# Patient Record
Sex: Male | Born: 1950
Health system: Southern US, Community
[De-identification: ages and names within clinical notes are randomized; demographics above are authoritative.]

## PROBLEM LIST (undated history)

## (undated) ENCOUNTER — Emergency Department (HOSPITAL_COMMUNITY): Admission: EM | Payer: Medicare HMO | Source: Home / Self Care

## (undated) DIAGNOSIS — F419 Anxiety disorder, unspecified: Secondary | ICD-10-CM

## (undated) DIAGNOSIS — B182 Chronic viral hepatitis C: Secondary | ICD-10-CM

## (undated) DIAGNOSIS — E119 Type 2 diabetes mellitus without complications: Secondary | ICD-10-CM

## (undated) DIAGNOSIS — I10 Essential (primary) hypertension: Secondary | ICD-10-CM

## (undated) DIAGNOSIS — I214 Non-ST elevation (NSTEMI) myocardial infarction: Secondary | ICD-10-CM

## (undated) DIAGNOSIS — Z21 Asymptomatic human immunodeficiency virus [HIV] infection status: Secondary | ICD-10-CM

## (undated) DIAGNOSIS — T7840XA Allergy, unspecified, initial encounter: Secondary | ICD-10-CM

## (undated) DIAGNOSIS — F32A Depression, unspecified: Secondary | ICD-10-CM

## (undated) HISTORY — DX: Allergy, unspecified, initial encounter: T78.40XA

## (undated) HISTORY — DX: Anxiety disorder, unspecified: F41.9

## (undated) HISTORY — DX: Depression, unspecified: F32.A

## (undated) HISTORY — PX: NO PAST SURGERIES: SHX2092

---

## 2006-11-12 ENCOUNTER — Emergency Department (HOSPITAL_COMMUNITY): Admission: EM | Admit: 2006-11-12 | Discharge: 2006-11-13 | Payer: Self-pay | Admitting: Emergency Medicine

## 2007-05-25 ENCOUNTER — Ambulatory Visit: Payer: Self-pay | Admitting: Internal Medicine

## 2007-05-25 ENCOUNTER — Encounter (INDEPENDENT_AMBULATORY_CARE_PROVIDER_SITE_OTHER): Payer: Self-pay | Admitting: Nurse Practitioner

## 2007-05-25 LAB — CONVERTED CEMR LAB
ALT: 56 units/L — ABNORMAL HIGH (ref 0–53)
AST: 44 units/L — ABNORMAL HIGH (ref 0–37)
Albumin: 4 g/dL (ref 3.5–5.2)
Alkaline Phosphatase: 77 units/L (ref 39–117)
BUN: 19 mg/dL (ref 6–23)
Basophils Absolute: 0 10*3/uL (ref 0.0–0.1)
Basophils Relative: 0 % (ref 0–1)
CO2: 23 meq/L (ref 19–32)
Calcium: 9.2 mg/dL (ref 8.4–10.5)
Chloride: 107 meq/L (ref 96–112)
Cholesterol: 190 mg/dL (ref 0–200)
Creatinine, Ser: 1.08 mg/dL (ref 0.40–1.50)
Eosinophils Absolute: 0.2 10*3/uL (ref 0.0–0.7)
Eosinophils Relative: 5 % (ref 0–5)
Glucose, Bld: 80 mg/dL (ref 70–99)
HCT: 47.1 % (ref 39.0–52.0)
HDL: 42 mg/dL (ref >39)
Hemoglobin: 15.6 g/dL (ref 13.0–17.0)
LDL Cholesterol: 128 mg/dL — ABNORMAL HIGH (ref 0–99)
Lymphocytes Relative: 42 % (ref 12–46)
Lymphs Abs: 1.7 10*3/uL (ref 0.7–4.0)
MCHC: 33.1 g/dL (ref 30.0–36.0)
MCV: 89.7 fL (ref 78.0–100.0)
Monocytes Absolute: 0.4 10*3/uL (ref 0.1–1.0)
Monocytes Relative: 10 % (ref 3–12)
Neutro Abs: 1.8 10*3/uL (ref 1.7–7.7)
Neutrophils Relative %: 44 % (ref 43–77)
PSA: 2.73 ng/mL (ref 0.10–4.00)
Platelets: 224 10*3/uL (ref 150–400)
Potassium: 4.1 meq/L (ref 3.5–5.3)
RBC: 5.25 M/uL (ref 4.22–5.81)
RDW: 13.3 % (ref 11.5–15.5)
Sodium: 141 meq/L (ref 135–145)
TSH: 1.066 microintl units/mL (ref 0.350–5.50)
Total Bilirubin: 1 mg/dL (ref 0.3–1.2)
Total CHOL/HDL Ratio: 4.5
Total Protein: 7.7 g/dL (ref 6.0–8.3)
Triglycerides: 101 mg/dL (ref <150)
VLDL: 20 mg/dL (ref 0–40)
WBC: 4.2 10*3/uL (ref 4.0–10.5)

## 2007-05-26 ENCOUNTER — Ambulatory Visit: Payer: Self-pay | Admitting: *Deleted

## 2007-07-13 ENCOUNTER — Encounter (INDEPENDENT_AMBULATORY_CARE_PROVIDER_SITE_OTHER): Payer: Self-pay | Admitting: Nurse Practitioner

## 2007-07-13 ENCOUNTER — Ambulatory Visit: Payer: Self-pay | Admitting: Family Medicine

## 2007-07-13 DIAGNOSIS — B182 Chronic viral hepatitis C: Secondary | ICD-10-CM

## 2007-07-13 DIAGNOSIS — Z8619 Personal history of other infectious and parasitic diseases: Secondary | ICD-10-CM | POA: Insufficient documentation

## 2007-07-13 LAB — CONVERTED CEMR LAB
HCV Ab: POSITIVE — AB
Hep A Total Ab: POSITIVE — AB
Hep B Core Total Ab: POSITIVE — AB
Hep B E Ab: NEGATIVE
Hep B S Ab: NEGATIVE

## 2007-07-21 ENCOUNTER — Ambulatory Visit: Payer: Self-pay | Admitting: Internal Medicine

## 2007-07-21 LAB — CONVERTED CEMR LAB
HCV Genotype: 1
HCV Quantitative: 2310000 intl units/mL — ABNORMAL HIGH (ref <43)

## 2007-08-03 ENCOUNTER — Ambulatory Visit: Payer: Self-pay | Admitting: Internal Medicine

## 2007-08-10 ENCOUNTER — Ambulatory Visit: Payer: Self-pay | Admitting: Internal Medicine

## 2007-10-12 ENCOUNTER — Ambulatory Visit: Payer: Self-pay | Admitting: Family Medicine

## 2007-12-27 ENCOUNTER — Ambulatory Visit: Payer: Self-pay | Admitting: Internal Medicine

## 2007-12-27 LAB — CONVERTED CEMR LAB
ALT: 70 units/L — ABNORMAL HIGH (ref 0–53)
AST: 52 units/L — ABNORMAL HIGH (ref 0–37)
Albumin: 4.3 g/dL (ref 3.5–5.2)
Alkaline Phosphatase: 78 units/L (ref 39–117)
Amphetamine Screen, Ur: NEGATIVE
BUN: 20 mg/dL (ref 6–23)
Barbiturate Quant, Ur: NEGATIVE
Benzodiazepines.: NEGATIVE
CO2: 28 meq/L (ref 19–32)
Calcium: 9.5 mg/dL (ref 8.4–10.5)
Chloride: 102 meq/L (ref 96–112)
Cholesterol: 186 mg/dL (ref 0–200)
Cocaine Metabolites: NEGATIVE
Creatinine, Ser: 1.18 mg/dL (ref 0.40–1.50)
Creatinine,U: 258.6 mg/dL
Glucose, Bld: 81 mg/dL (ref 70–99)
HDL: 44 mg/dL (ref >39)
LDL Cholesterol: 123 mg/dL — ABNORMAL HIGH (ref 0–99)
Marijuana Metabolite: NEGATIVE
Methadone: NEGATIVE
Opiate Screen, Urine: NEGATIVE
Phencyclidine (PCP): NEGATIVE
Potassium: 3.9 meq/L (ref 3.5–5.3)
Propoxyphene: NEGATIVE
Sodium: 139 meq/L (ref 135–145)
Total Bilirubin: 0.9 mg/dL (ref 0.3–1.2)
Total CHOL/HDL Ratio: 4.2
Total Protein: 8.9 g/dL — ABNORMAL HIGH (ref 6.0–8.3)
Triglycerides: 95 mg/dL (ref <150)
VLDL: 19 mg/dL (ref 0–40)

## 2008-02-09 ENCOUNTER — Ambulatory Visit: Payer: Self-pay | Admitting: Gastroenterology

## 2008-03-30 ENCOUNTER — Encounter (INDEPENDENT_AMBULATORY_CARE_PROVIDER_SITE_OTHER): Payer: Self-pay | Admitting: Diagnostic Radiology

## 2008-03-30 ENCOUNTER — Ambulatory Visit (HOSPITAL_COMMUNITY): Admission: RE | Admit: 2008-03-30 | Discharge: 2008-03-30 | Payer: Self-pay | Admitting: Gastroenterology

## 2008-04-10 ENCOUNTER — Ambulatory Visit: Payer: Self-pay | Admitting: Gastroenterology

## 2008-05-05 ENCOUNTER — Emergency Department (HOSPITAL_COMMUNITY): Admission: EM | Admit: 2008-05-05 | Discharge: 2008-05-05 | Payer: Self-pay | Admitting: Emergency Medicine

## 2008-05-08 ENCOUNTER — Ambulatory Visit: Payer: Self-pay | Admitting: Internal Medicine

## 2008-05-28 ENCOUNTER — Encounter (INDEPENDENT_AMBULATORY_CARE_PROVIDER_SITE_OTHER): Payer: Self-pay | Admitting: Internal Medicine

## 2008-05-28 ENCOUNTER — Ambulatory Visit: Payer: Self-pay | Admitting: Internal Medicine

## 2008-05-28 LAB — CONVERTED CEMR LAB
ALT: 27 units/L (ref 0–53)
AST: 28 units/L (ref 0–37)
Albumin: 4.2 g/dL (ref 3.5–5.2)
Alkaline Phosphatase: 76 units/L (ref 39–117)
BUN: 16 mg/dL (ref 6–23)
Band Neutrophils: 0 % (ref 0–10)
Basophils Absolute: 0 10*3/uL (ref 0.0–0.1)
Basophils Relative: 1 % (ref 0–1)
CO2: 28 meq/L (ref 19–32)
Calcium: 9.4 mg/dL (ref 8.4–10.5)
Chloride: 100 meq/L (ref 96–112)
Cholesterol: 174 mg/dL (ref 0–200)
Creatinine, Ser: 1.05 mg/dL (ref 0.40–1.50)
Eosinophils Absolute: 0.1 10*3/uL (ref 0.0–0.7)
Eosinophils Relative: 3 % (ref 0–5)
Glucose, Bld: 90 mg/dL (ref 70–99)
HCT: 47.2 % (ref 39.0–52.0)
HDL: 36 mg/dL — ABNORMAL LOW (ref >39)
Hemoglobin: 15.7 g/dL (ref 13.0–17.0)
LDL Cholesterol: 115 mg/dL — ABNORMAL HIGH (ref 0–99)
Lymphocytes Relative: 45 % (ref 12–46)
Lymphs Abs: 1.3 10*3/uL (ref 0.7–4.0)
MCHC: 33.3 g/dL (ref 30.0–36.0)
MCV: 89.6 fL (ref 78.0–100.0)
Monocytes Absolute: 0.3 10*3/uL (ref 0.1–1.0)
Monocytes Relative: 9 % (ref 3–12)
Neutro Abs: 1.2 10*3/uL — ABNORMAL LOW (ref 1.7–7.7)
Neutrophils Relative %: 42 % — ABNORMAL LOW (ref 43–77)
PSA: 5.03 ng/mL — ABNORMAL HIGH (ref 0.10–4.00)
Platelets: 273 10*3/uL (ref 150–400)
Potassium: 3.7 meq/L (ref 3.5–5.3)
RBC: 5.27 M/uL (ref 4.22–5.81)
RDW: 12.5 % (ref 11.5–15.5)
Sodium: 139 meq/L (ref 135–145)
Total Bilirubin: 0.6 mg/dL (ref 0.3–1.2)
Total CHOL/HDL Ratio: 4.8
Total Protein: 8.6 g/dL — ABNORMAL HIGH (ref 6.0–8.3)
Triglycerides: 113 mg/dL (ref <150)
VLDL: 23 mg/dL (ref 0–40)
WBC: 2.9 10*3/uL — ABNORMAL LOW (ref 4.0–10.5)

## 2008-09-19 ENCOUNTER — Ambulatory Visit: Payer: Self-pay | Admitting: Internal Medicine

## 2008-09-25 ENCOUNTER — Ambulatory Visit (HOSPITAL_COMMUNITY): Admission: RE | Admit: 2008-09-25 | Discharge: 2008-09-25 | Payer: Self-pay | Admitting: Internal Medicine

## 2008-10-05 ENCOUNTER — Ambulatory Visit: Payer: Self-pay | Admitting: Internal Medicine

## 2008-12-13 ENCOUNTER — Encounter (INDEPENDENT_AMBULATORY_CARE_PROVIDER_SITE_OTHER): Payer: Self-pay | Admitting: Internal Medicine

## 2008-12-13 ENCOUNTER — Ambulatory Visit: Payer: Self-pay | Admitting: Internal Medicine

## 2008-12-13 LAB — CONVERTED CEMR LAB
ALT: 83 units/L — ABNORMAL HIGH (ref 0–53)
AST: 57 units/L — ABNORMAL HIGH (ref 0–37)
Albumin: 4.1 g/dL (ref 3.5–5.2)
Alkaline Phosphatase: 72 units/L (ref 39–117)
BUN: 15 mg/dL (ref 6–23)
CO2: 21 meq/L (ref 19–32)
Calcium: 8.9 mg/dL (ref 8.4–10.5)
Chloride: 108 meq/L (ref 96–112)
Cholesterol: 163 mg/dL (ref 0–200)
Creatinine, Ser: 1.03 mg/dL (ref 0.40–1.50)
Glucose, Bld: 84 mg/dL (ref 70–99)
HDL: 36 mg/dL — ABNORMAL LOW (ref >39)
LDL Cholesterol: 111 mg/dL — ABNORMAL HIGH (ref 0–99)
Potassium: 4.1 meq/L (ref 3.5–5.3)
Sodium: 140 meq/L (ref 135–145)
Total Bilirubin: 1.1 mg/dL (ref 0.3–1.2)
Total CHOL/HDL Ratio: 4.5
Total Protein: 8.3 g/dL (ref 6.0–8.3)
Triglycerides: 82 mg/dL (ref <150)
VLDL: 16 mg/dL (ref 0–40)

## 2008-12-18 ENCOUNTER — Ambulatory Visit: Payer: Self-pay | Admitting: Internal Medicine

## 2008-12-18 ENCOUNTER — Encounter (INDEPENDENT_AMBULATORY_CARE_PROVIDER_SITE_OTHER): Payer: Self-pay | Admitting: Internal Medicine

## 2008-12-18 DIAGNOSIS — B20 Human immunodeficiency virus [HIV] disease: Secondary | ICD-10-CM | POA: Insufficient documentation

## 2008-12-18 LAB — CONVERTED CEMR LAB
HIV 1 RNA Quant: 12000 copies/mL — ABNORMAL HIGH (ref <48)
HIV-1 RNA Quant, Log: 4.08 — ABNORMAL HIGH (ref <1.68)

## 2008-12-21 ENCOUNTER — Encounter (INDEPENDENT_AMBULATORY_CARE_PROVIDER_SITE_OTHER): Payer: Self-pay | Admitting: Internal Medicine

## 2008-12-21 LAB — CONVERTED CEMR LAB
HIV-1 antibody: POSITIVE — AB
HIV-2 Ab: UNDETERMINED — AB

## 2008-12-27 ENCOUNTER — Ambulatory Visit: Payer: Self-pay | Admitting: Internal Medicine

## 2009-01-01 ENCOUNTER — Encounter (INDEPENDENT_AMBULATORY_CARE_PROVIDER_SITE_OTHER): Payer: Self-pay | Admitting: *Deleted

## 2009-01-01 ENCOUNTER — Encounter (INDEPENDENT_AMBULATORY_CARE_PROVIDER_SITE_OTHER): Payer: Self-pay | Admitting: Adult Health

## 2009-01-01 ENCOUNTER — Ambulatory Visit: Payer: Self-pay | Admitting: Internal Medicine

## 2009-01-01 LAB — CONVERTED CEMR LAB
Absolute CD4: 642 #/uL (ref 381–1469)
Basophils Absolute: 0 10*3/uL (ref 0.0–0.1)
Basophils Relative: 1 % (ref 0–1)
CD4 T Helper %: 37 % (ref 32–62)
Eosinophils Absolute: 0.1 10*3/uL (ref 0.0–0.7)
Eosinophils Relative: 3 % (ref 0–5)
HCT: 43.6 % (ref 39.0–52.0)
HCV Ab: REACTIVE — AB
Hemoglobin: 14.6 g/dL (ref 13.0–17.0)
Hep A Total Ab: POSITIVE — AB
Hep B S Ab: NEGATIVE
Hepatitis B Surface Ag: NEGATIVE
Lymphocytes Relative: 51 % — ABNORMAL HIGH (ref 12–46)
Lymphs Abs: 1.8 10*3/uL (ref 0.7–4.0)
MCHC: 33.5 g/dL (ref 30.0–36.0)
MCV: 88.4 fL (ref 78.0–100.0)
Monocytes Absolute: 0.3 10*3/uL (ref 0.1–1.0)
Monocytes Relative: 10 % (ref 3–12)
Neutro Abs: 1.2 10*3/uL — ABNORMAL LOW (ref 1.7–7.7)
Neutrophils Relative %: 36 % — ABNORMAL LOW (ref 43–77)
Platelets: 240 10*3/uL (ref 150–400)
RBC: 4.93 M/uL (ref 4.22–5.81)
RDW: 12.8 % (ref 11.5–15.5)
Total lymphocyte count: 1734 cells/mcL (ref 700–3300)
WBC: 3.4 10*3/uL — ABNORMAL LOW (ref 4.0–10.5)

## 2009-01-17 ENCOUNTER — Encounter: Payer: Self-pay | Admitting: Internal Medicine

## 2009-01-17 ENCOUNTER — Ambulatory Visit: Payer: Self-pay | Admitting: Internal Medicine

## 2009-01-17 DIAGNOSIS — E785 Hyperlipidemia, unspecified: Secondary | ICD-10-CM | POA: Insufficient documentation

## 2009-01-17 DIAGNOSIS — I10 Essential (primary) hypertension: Secondary | ICD-10-CM | POA: Insufficient documentation

## 2009-04-17 ENCOUNTER — Ambulatory Visit: Payer: Self-pay | Admitting: Internal Medicine

## 2009-04-17 LAB — CONVERTED CEMR LAB
ALT: 58 units/L — ABNORMAL HIGH (ref 0–53)
AST: 48 units/L — ABNORMAL HIGH (ref 0–37)
Absolute CD4: 598 #/uL (ref 381–1469)
Albumin: 4.3 g/dL (ref 3.5–5.2)
Alkaline Phosphatase: 65 units/L (ref 39–117)
BUN: 17 mg/dL (ref 6–23)
Basophils Absolute: 0 10*3/uL (ref 0.0–0.1)
Basophils Relative: 0 % (ref 0–1)
CD4 T Helper %: 37 % (ref 32–62)
CO2: 22 meq/L (ref 19–32)
Calcium: 9.4 mg/dL (ref 8.4–10.5)
Chloride: 106 meq/L (ref 96–112)
Creatinine, Ser: 1 mg/dL (ref 0.40–1.50)
Eosinophils Absolute: 0.1 10*3/uL (ref 0.0–0.7)
Eosinophils Relative: 3 % (ref 0–5)
Glucose, Bld: 76 mg/dL (ref 70–99)
HCT: 45.4 % (ref 39.0–52.0)
HIV 1 RNA Quant: 5090 copies/mL — ABNORMAL HIGH (ref <48)
HIV-1 RNA Quant, Log: 3.71 — ABNORMAL HIGH (ref <1.68)
Hemoglobin: 15.2 g/dL (ref 13.0–17.0)
Lymphocytes Relative: 49 % — ABNORMAL HIGH (ref 12–46)
Lymphs Abs: 1.6 10*3/uL (ref 0.7–4.0)
MCHC: 33.5 g/dL (ref 30.0–36.0)
MCV: 88.8 fL (ref 78.0–100.0)
Monocytes Absolute: 0.3 10*3/uL (ref 0.1–1.0)
Monocytes Relative: 10 % (ref 3–12)
Neutro Abs: 1.3 10*3/uL — ABNORMAL LOW (ref 1.7–7.7)
Neutrophils Relative %: 39 % — ABNORMAL LOW (ref 43–77)
Platelets: 245 10*3/uL (ref 150–400)
Potassium: 3.9 meq/L (ref 3.5–5.3)
RBC: 5.11 M/uL (ref 4.22–5.81)
RDW: 13.2 % (ref 11.5–15.5)
Sodium: 138 meq/L (ref 135–145)
Total Bilirubin: 0.8 mg/dL (ref 0.3–1.2)
Total Protein: 8.5 g/dL — ABNORMAL HIGH (ref 6.0–8.3)
Total lymphocyte count: 1617 cells/mcL (ref 700–3300)
WBC: 3.3 10*3/uL — ABNORMAL LOW (ref 4.0–10.5)

## 2009-05-02 ENCOUNTER — Encounter: Payer: Self-pay | Admitting: Internal Medicine

## 2009-05-02 ENCOUNTER — Ambulatory Visit: Payer: Self-pay | Admitting: Internal Medicine

## 2009-05-24 ENCOUNTER — Telehealth: Payer: Self-pay | Admitting: Internal Medicine

## 2009-07-10 ENCOUNTER — Emergency Department (HOSPITAL_COMMUNITY): Admission: EM | Admit: 2009-07-10 | Discharge: 2009-07-10 | Payer: Self-pay | Admitting: Emergency Medicine

## 2009-07-25 ENCOUNTER — Encounter: Payer: Self-pay | Admitting: Internal Medicine

## 2009-07-30 ENCOUNTER — Ambulatory Visit: Payer: Self-pay | Admitting: Internal Medicine

## 2009-07-30 LAB — CONVERTED CEMR LAB
ALT: 58 units/L — ABNORMAL HIGH (ref 0–53)
AST: 45 units/L — ABNORMAL HIGH (ref 0–37)
Albumin: 4.3 g/dL (ref 3.5–5.2)
Alkaline Phosphatase: 86 units/L (ref 39–117)
BUN: 21 mg/dL (ref 6–23)
Basophils Absolute: 0 10*3/uL (ref 0.0–0.1)
Basophils Relative: 0 % (ref 0–1)
CO2: 30 meq/L (ref 19–32)
Calcium: 9.8 mg/dL (ref 8.4–10.5)
Chloride: 97 meq/L (ref 96–112)
Creatinine, Ser: 1.17 mg/dL (ref 0.40–1.50)
Eosinophils Absolute: 0.1 10*3/uL (ref 0.0–0.7)
Eosinophils Relative: 1 % (ref 0–5)
Glucose, Bld: 67 mg/dL — ABNORMAL LOW (ref 70–99)
HCT: 48.8 % (ref 39.0–52.0)
HIV 1 RNA Quant: 2400 copies/mL — ABNORMAL HIGH (ref <48)
HIV-1 RNA Quant, Log: 3.38 — ABNORMAL HIGH (ref <1.68)
Hemoglobin: 16.3 g/dL (ref 13.0–17.0)
Lymphocytes Relative: 43 % (ref 12–46)
Lymphs Abs: 2 10*3/uL (ref 0.7–4.0)
MCHC: 33.4 g/dL (ref 30.0–36.0)
MCV: 87.9 fL (ref 78.0–100.0)
Monocytes Absolute: 0.4 10*3/uL (ref 0.1–1.0)
Monocytes Relative: 10 % (ref 3–12)
Neutro Abs: 2.1 10*3/uL (ref 1.7–7.7)
Neutrophils Relative %: 45 % (ref 43–77)
Platelets: 305 10*3/uL (ref 150–400)
Potassium: 3.4 meq/L — ABNORMAL LOW (ref 3.5–5.3)
RBC: 5.55 M/uL (ref 4.22–5.81)
RDW: 12.5 % (ref 11.5–15.5)
Sodium: 138 meq/L (ref 135–145)
Total Bilirubin: 1 mg/dL (ref 0.3–1.2)
Total Protein: 9.7 g/dL — ABNORMAL HIGH (ref 6.0–8.3)
WBC: 4.6 10*3/uL (ref 4.0–10.5)

## 2009-08-21 ENCOUNTER — Ambulatory Visit: Payer: Self-pay | Admitting: Internal Medicine

## 2009-10-02 ENCOUNTER — Encounter (INDEPENDENT_AMBULATORY_CARE_PROVIDER_SITE_OTHER): Payer: Self-pay | Admitting: *Deleted

## 2009-10-16 ENCOUNTER — Encounter: Payer: Self-pay | Admitting: Internal Medicine

## 2009-12-10 ENCOUNTER — Ambulatory Visit: Payer: Self-pay | Admitting: Internal Medicine

## 2009-12-10 LAB — CONVERTED CEMR LAB
ALT: 46 units/L (ref 0–53)
AST: 37 units/L (ref 0–37)
Albumin: 4 g/dL (ref 3.5–5.2)
Alkaline Phosphatase: 73 units/L (ref 39–117)
BUN: 18 mg/dL (ref 6–23)
Basophils Absolute: 0 10*3/uL (ref 0.0–0.1)
Basophils Relative: 0 % (ref 0–1)
CO2: 23 meq/L (ref 19–32)
Calcium: 8.7 mg/dL (ref 8.4–10.5)
Chloride: 110 meq/L (ref 96–112)
Creatinine, Ser: 0.96 mg/dL (ref 0.40–1.50)
Eosinophils Absolute: 0.1 10*3/uL (ref 0.0–0.7)
Eosinophils Relative: 2 % (ref 0–5)
Glucose, Bld: 91 mg/dL (ref 70–99)
HCT: 43.9 % (ref 39.0–52.0)
HIV 1 RNA Quant: 10400 copies/mL — ABNORMAL HIGH (ref <20)
HIV-1 RNA Quant, Log: 4.02 — ABNORMAL HIGH (ref <1.30)
Hemoglobin: 14.8 g/dL (ref 13.0–17.0)
Lymphocytes Relative: 49 % — ABNORMAL HIGH (ref 12–46)
Lymphs Abs: 2.1 10*3/uL (ref 0.7–4.0)
MCHC: 33.7 g/dL (ref 30.0–36.0)
MCV: 88.2 fL (ref 78.0–100.0)
Monocytes Absolute: 0.4 10*3/uL (ref 0.1–1.0)
Monocytes Relative: 10 % (ref 3–12)
Neutro Abs: 1.6 10*3/uL — ABNORMAL LOW (ref 1.7–7.7)
Neutrophils Relative %: 38 % — ABNORMAL LOW (ref 43–77)
Platelets: 239 10*3/uL (ref 150–400)
Potassium: 4.1 meq/L (ref 3.5–5.3)
RBC: 4.98 M/uL (ref 4.22–5.81)
RDW: 12.9 % (ref 11.5–15.5)
Sodium: 141 meq/L (ref 135–145)
Total Bilirubin: 0.5 mg/dL (ref 0.3–1.2)
Total Protein: 7.9 g/dL (ref 6.0–8.3)
WBC: 4.2 10*3/uL (ref 4.0–10.5)

## 2009-12-24 ENCOUNTER — Ambulatory Visit: Payer: Self-pay | Admitting: Internal Medicine

## 2010-03-25 ENCOUNTER — Encounter: Payer: Self-pay | Admitting: Internal Medicine

## 2010-03-25 ENCOUNTER — Ambulatory Visit
Admission: RE | Admit: 2010-03-25 | Discharge: 2010-03-25 | Payer: Self-pay | Source: Home / Self Care | Attending: Internal Medicine | Admitting: Internal Medicine

## 2010-03-25 LAB — CONVERTED CEMR LAB
ALT: 35 units/L (ref 0–53)
AST: 32 units/L (ref 0–37)
Albumin: 4 g/dL (ref 3.5–5.2)
Alkaline Phosphatase: 75 units/L (ref 39–117)
BUN: 15 mg/dL (ref 6–23)
Basophils Absolute: 0 10*3/uL (ref 0.0–0.1)
Basophils Relative: 1 % (ref 0–1)
CO2: 26 meq/L (ref 19–32)
Calcium: 9.4 mg/dL (ref 8.4–10.5)
Chloride: 105 meq/L (ref 96–112)
Cholesterol: 178 mg/dL (ref 0–200)
Creatinine, Ser: 1.07 mg/dL (ref 0.40–1.50)
Eosinophils Absolute: 0.1 10*3/uL (ref 0.0–0.7)
Eosinophils Relative: 2 % (ref 0–5)
Glucose, Bld: 90 mg/dL (ref 70–99)
HCT: 44.9 % (ref 39.0–52.0)
HDL: 25 mg/dL — ABNORMAL LOW (ref >39)
HIV 1 RNA Quant: 18300 copies/mL — ABNORMAL HIGH (ref <20)
HIV-1 RNA Quant, Log: 4.26 — ABNORMAL HIGH (ref <1.30)
Hemoglobin: 15.5 g/dL (ref 13.0–17.0)
LDL Cholesterol: 118 mg/dL — ABNORMAL HIGH (ref 0–99)
Lymphocytes Relative: 50 % — ABNORMAL HIGH (ref 12–46)
Lymphs Abs: 1.9 10*3/uL (ref 0.7–4.0)
MCHC: 34.5 g/dL (ref 30.0–36.0)
MCV: 87.4 fL (ref 78.0–100.0)
Monocytes Absolute: 0.4 10*3/uL (ref 0.1–1.0)
Monocytes Relative: 10 % (ref 3–12)
Neutro Abs: 1.4 10*3/uL — ABNORMAL LOW (ref 1.7–7.7)
Neutrophils Relative %: 37 % — ABNORMAL LOW (ref 43–77)
Platelets: 235 10*3/uL (ref 150–400)
Potassium: 4.1 meq/L (ref 3.5–5.3)
RBC: 5.14 M/uL (ref 4.22–5.81)
RDW: 12.6 % (ref 11.5–15.5)
Sodium: 140 meq/L (ref 135–145)
Total Bilirubin: 0.7 mg/dL (ref 0.3–1.2)
Total CHOL/HDL Ratio: 7.1
Total Protein: 8.6 g/dL — ABNORMAL HIGH (ref 6.0–8.3)
Triglycerides: 175 mg/dL — ABNORMAL HIGH (ref <150)
VLDL: 35 mg/dL (ref 0–40)
WBC: 3.7 10*3/uL — ABNORMAL LOW (ref 4.0–10.5)

## 2010-04-14 ENCOUNTER — Ambulatory Visit: Admit: 2010-04-14 | Payer: Self-pay | Admitting: Internal Medicine

## 2010-04-14 ENCOUNTER — Ambulatory Visit (INDEPENDENT_AMBULATORY_CARE_PROVIDER_SITE_OTHER)
Admission: RE | Admit: 2010-04-14 | Discharge: 2010-04-14 | Payer: Self-pay | Source: Home / Self Care | Attending: Adult Health | Admitting: Adult Health

## 2010-04-14 DIAGNOSIS — G4700 Insomnia, unspecified: Secondary | ICD-10-CM | POA: Insufficient documentation

## 2010-04-17 ENCOUNTER — Ambulatory Visit
Admission: RE | Admit: 2010-04-17 | Discharge: 2010-04-17 | Payer: Self-pay | Source: Home / Self Care | Attending: Adult Health | Admitting: Adult Health

## 2010-04-17 ENCOUNTER — Encounter: Payer: Self-pay | Admitting: Adult Health

## 2010-04-17 LAB — CONVERTED CEMR LAB
ALT: 44 units/L (ref 0–53)
AST: 38 units/L — ABNORMAL HIGH (ref 0–37)
Albumin: 4.3 g/dL (ref 3.5–5.2)
Alkaline Phosphatase: 84 units/L (ref 39–117)
BUN: 18 mg/dL (ref 6–23)
CD4 Count: 578 microliters
CD4 Count: 578 microliters
CO2: 28 meq/L (ref 19–32)
Calcium: 9.8 mg/dL (ref 8.4–10.5)
Chloride: 104 meq/L (ref 96–112)
Creatinine, Ser: 1.13 mg/dL (ref 0.40–1.50)
Glucose, Bld: 80 mg/dL (ref 70–99)
HIV 1 RNA Quant: 14000 copies/mL — ABNORMAL HIGH (ref <20)
HIV-1 RNA Quant, Log: 4.15 — ABNORMAL HIGH (ref <1.30)
HIV-1 antibody: POSITIVE — AB
HIV-2 Ab: POSITIVE — AB
HIV: REACTIVE
Potassium: 4.1 meq/L (ref 3.5–5.3)
Sodium: 139 meq/L (ref 135–145)
Total Bilirubin: 0.7 mg/dL (ref 0.3–1.2)
Total Protein: 9.2 g/dL — ABNORMAL HIGH (ref 6.0–8.3)

## 2010-04-22 NOTE — Assessment & Plan Note (Signed)
Summary: F/U/VS   CC:  follow-up visit, lab results, out of B/P med. for two days, and B/P elevated.  History of Present Illness: Pt here for f/u.  He is out of his BP meds.  He needs glasses and would like a referral to get his eyes checked.  Preventive Screening-Counseling & Management  Alcohol-Tobacco     Alcohol drinks/day: 0     Smoking Status: current     Packs/Day: <0.25     Year Started: 1970     Passive Smoke Exposure: No  Caffeine-Diet-Exercise     Caffeine use/day: 2     Does Patient Exercise: yes     Type of exercise: walks     Exercise (avg: min/session): 30-60     Times/week: 3  Safety-Violence-Falls     Seat Belt Use: yes      Drug Use:  No.    Comments: pt. declined condoms   Updated Prior Medication List: HYDROCHLOROTHIAZIDE 25 MG TABS (HYDROCHLOROTHIAZIDE) Take 1 tablet by mouth once a day  Current Allergies (reviewed today): No known allergies  Past History:  Past Medical History: Last updated: 01/17/2009 Hepatitis C HIV disease Hyperlipidemia Hypertension  Review of Systems  The patient denies fever, chest pain, dyspnea on exertion, and headaches.    Vital Signs:  Patient profile:   60 year old male Height:      67 inches (170.18 cm) Weight:      158.8 pounds (72.18 kg) BMI:     24.96 Temp:     97.8 degrees F (36.56 degrees C) oral Pulse rate:   69 / minute BP sitting:   155 / 103  (left arm)  Vitals Entered By: Wendall Mola CMA Duncan Dull) (August 21, 2009 10:48 AM) CC: follow-up visit, lab results, out of B/P med. for two days, B/P elevated Is Patient Diabetic? No Pain Assessment Patient in pain? no      Nutritional Status BMI of 19 -24 = normal Nutritional Status Detail appetite "good"  Does patient need assistance? Functional Status Self care Ambulation Normal   Physical Exam  General:  alert, well-developed, well-nourished, and well-hydrated.   Head:  normocephalic and atraumatic.   Lungs:  normal  breath sounds.      Impression & Recommendations:  Problem # 1:  HIV DISEASE (ICD-042) Pt currently asyptomatic and not on therapy. F/u in 3 months. third Hepatits B vaccine given. Diagnostics Reviewed:  HIV-Western blot: Positive (12/21/2008)   CD4: 540 (07/30/2009)   WBC: 4.6 (07/30/2009)   PMN (bands): 0 (05/28/2008)   Hgb: 16.3 (07/30/2009)   HCT: 48.8 (07/30/2009)   Platelets: 305 (07/30/2009) HIV-1 RNA: 2400 (07/30/2009)   HBSAg: NEG (01/01/2009)  Problem # 2:  HYPERTENSION (ICD-401.9) re-start HCTZ. The following medications were removed from the medication list:    Hydrochlorothiazide 25 Mg Tabs (Hydrochlorothiazide) .[name]... Take 1 tablet by mouth once a day His updated medication list for this problem includes:    Hydrochlorothiazide 25 Mg Tabs (Hydrochlorothiazide) .[name]... Take 1 tablet by mouth once a day  Medications Added to Medication List This Visit: 1)  Hydrochlorothiazide 25 Mg Tabs (Hydrochlorothiazide) .... Take 1 tablet by mouth once a day  Other Orders: Est. Patient Level III (16109) Hepatitis B Vaccine >13yrs (60454) Admin 1st Vaccine (09811) Future Orders: T-CD4SP ([facility]) (CD4SP) ... 11/19/2009 T-HIV Viral Load [phone]) ... 11/19/2009 T-Comprehensive Metabolic Panel [phone]) ... 11/19/2009 T-CBC w/Diff ([id number]) ... 11/19/2009  Patient Instructions: 1)  Please schedule a follow-up appointment in 3 months,  2 weeks after labs.  Prescriptions: HYDROCHLOROTHIAZIDE 25 MG TABS (HYDROCHLOROTHIAZIDE) Take 1 tablet by mouth once a day  #30 x 5   Entered and Authorized by:   Yisroel Ramming MD   Signed by:   Yisroel Ramming MD on 08/21/2009   Method used:   Print then Give to Patient   RxID:   [id number]    Immunizations Administered:  Hepatitis B Vaccine # 3:    Vaccine Type: HepB Adult    Site: right deltoid    Mfr: Merck    Dose: 0.5 ml    Route: IM    Given by: Wendall Mola CMA ( AAMA)    Exp. Date: 12/18/2011    Lot #:  [id number]    VIS given: 10/07/05 version given August 21, 2009.

## 2010-04-22 NOTE — Miscellaneous (Signed)
Summary: Office Visit ([facility])    Vital Signs:  Patient profile:   60 year old male Weight:      161 pounds Temp:     98.2 degrees F oral Pulse rate:   91 / minute Pulse rhythm:   regular Resp:     18 per minute BP sitting:   163 / 96  (left arm)  Vitals Entered By: Sharen Heck RN (May 02, 2009 10:21 AM) CC: f/u 05 Is Patient Diabetic? No Pain Assessment Patient in pain? no       Does patient need assistance? Functional Status Self care Ambulation Normal   CC:  f/u 05.  History of Present Illness: Pt lost his job and has been under a lot of stress recently.  He is out of his HCTZ - needs a new Rx.  Preventive Screening-Counseling & Management  Alcohol-Tobacco     Alcohol drinks/day: 0     Smoking Status: current     Packs/Day: <0.25     Year Started: 1970     Passive Smoke Exposure: No  Caffeine-Diet-Exercise     Caffeine use/day: 2     Does Patient Exercise: yes     Type of exercise: walks     Exercise (avg: min/session): 30-60     Times/week: 3  Current Problems (verified): 1)  Hypertension  (ICD-401.9) 2)  Hyperlipidemia  (ICD-272.4) 3)  HIV Disease  (ICD-042) 4)  Hepatitis C  (ICD-070.51) 5)  Hx Positive Ppd  ()  Current Medications (verified): 1)  Hydrochlorothiazide 25 Mg Tabs (Hydrochlorothiazide) .... Take 1 Tablet By Mouth Once A Day  Allergies (verified): No Known Drug Allergies   Review of Systems  The patient denies anorexia, fever, and weight loss.     Physical Exam  General:  alert, well-developed, well-nourished, and well-hydrated.   Head:  normocephalic and atraumatic.   Mouth:  pharynx pink and moist.   Lungs:  normal breath sounds.     Impression & Recommendations:  Problem # 1:  HIV DISEASE (ICD-042) Pt currently asymptomatic and not on treatment. Will repeat labs in 3 months. Second Hep B vaccine given today. Diagnostics Reviewed:  HIV-Western blot: Positive (12/21/2008)   WBC: 3.3 (04/17/2009)   PMN  (bands): 0 (05/28/2008)   Hgb: 15.2 (04/17/2009)   HCT: 45.4 (04/17/2009)   Platelets: 245 (04/17/2009) HIV-1 RNA: 5090 (04/17/2009)   HBSAg: NEG (01/01/2009)  Problem # 2:  HYPERTENSION (ICD-401.9) refill HCTZ. His updated medication list for this problem includes:    Hydrochlorothiazide 25 Mg Tabs (Hydrochlorothiazide) .[name]... Take 1 tablet by mouth once a day  Medications Added to Medication List This Visit: 1)  Hydrochlorothiazide 25 Mg Tabs (Hydrochlorothiazide) .... Take 1 tablet by mouth once a day  Other Orders: Hepatitis B Vaccine ADOLESCENT (2 dose) (04540) Admin 1st Vaccine (98119) Admin 1st Vaccine [facility]) [phone])   Patient Instructions: 1)  Please schedule a follow-up appointment in 3 months. Prescriptions: HYDROCHLOROTHIAZIDE 25 MG TABS (HYDROCHLOROTHIAZIDE) Take 1 tablet by mouth once a day  #30 x 5   Entered and Authorized by:   Yisroel Ramming MD   Signed by:   Yisroel Ramming MD on 05/02/2009   Method used:   Print then Give to Patient   RxID:   [id number]  ]  Hepatitis B Immunization History:    Hep B # 2:  HepB Adolescent (05/02/2009)  Hepatitis B Vaccine # 2    Vaccine Type: HepB Adolescent    Site: left deltoid    Mfr:  GlaxoSmithKline    Dose: 0.5 ml    Route: IM    Given by: Sharen Heck RN    Exp. Date: 08/23/2010    Lot #: [id number]    VIS given: 10/07/05 version given May 03, 2009. Vaccine given 05/02/09.

## 2010-04-22 NOTE — Miscellaneous (Signed)
Summary: Orders Update - labs  Clinical Lists Changes  Orders: Added new Test order of T-CBC w/Diff [phone]) - Signed Added new Test order of T-CD4SP [facility]) (CD4SP) - Signed Added new Test order of T-Comprehensive Metabolic Panel [phone]) - Signed Added new Test order of T-HIV Viral Load [phone]) - Signed     Process Orders Check Orders Results:     [facility]: ABN not required for this insurance Order queued for requisitioning for [facility]: Jul 25, 2009 8:35 AM  Tests Sent for requisitioning (Jul 25, 2009 8:35 AM):     07/30/2009: [facility] -- T-CBC w/Diff [[id number]] (signed)     07/30/2009: [facility] -- T-Comprehensive Metabolic Panel [[id number]] (signed)     07/30/2009: [facility] -- T-HIV Viral Load [phone] (signed)

## 2010-04-22 NOTE — Assessment & Plan Note (Signed)
Summary: followup on labs/jc   CC:  follow-up visit, lab results, B/P elevated, and out of B/P med. for two days.  History of Present Illness: Pt here for f/u. He has been under a lot of sress recently.  He has been out of his BP meds and will not have enough money to get a refill until Friday. No HA or CP.  Preventive Screening-Counseling & Management  Alcohol-Tobacco     Alcohol drinks/day: 0     Smoking Status: current     Packs/Day: <0.25     Year Started: 1970     Passive Smoke Exposure: No  Caffeine-Diet-Exercise     Caffeine use/day: 2     Does Patient Exercise: yes     Type of exercise: walks     Exercise (avg: min/session): 30-60     Times/week: 3  Safety-Violence-Falls     Seat Belt Use: yes      Drug Use:  former, heroin, and clean for 3.5 years 2008.    Comments: pt. declined condoms   Updated Prior Medication List: HYDROCHLOROTHIAZIDE 25 MG TABS (HYDROCHLOROTHIAZIDE) Take 1 tablet by mouth once a day  Current Allergies (reviewed today): No known allergies  Past History:  Past Medical History: Last updated: 01/17/2009 Hepatitis C HIV disease Hyperlipidemia Hypertension  Social History: Drug Use:  former, heroin, clean for 3.5 years 2008  Review of Systems  The patient denies anorexia, fever, weight loss, chest pain, syncope, and headaches.    Vital Signs:  Patient profile:   59 year old male Height:      67 inches (170.18 cm) Weight:      161.8 pounds (73.55 kg) BMI:     25.43 Temp:     98.4 degrees F (36.89 degrees C) oral Pulse rate:   87 / minute BP sitting:   161 / 111  (right arm)  Vitals Entered By: Wendall Mola CMA Duncan Dull) (December 24, 2009 3:39 PM) CC: follow-up visit, lab results, B/P elevated, out of B/P med. for two days Is Patient Diabetic? No Pain Assessment Patient in pain? no      Nutritional Status BMI of 25 - 29 = overweight Nutritional Status Detail appetite "good"  Does patient need  assistance? Functional Status Self care Ambulation Normal   Physical Exam  General:  alert, well-developed, well-nourished, and well-hydrated.   Head:  normocephalic and atraumatic.   Lungs:  normal breath sounds.   Heart:  normal rate and regular rhythm.     Impression & Recommendations:  Problem # 1:  HIV DISEASE (ICD-042) Pt currently asymptomatic and not on meds.  Will have him return in 3 months for repeat labs. Pneumavax and Influenza vaccine given. Diagnostics Reviewed:  HIV-Western blot: Positive (12/21/2008)   CD4: 680 (12/11/2009)   WBC: 4.2 (12/10/2009)   PMN (bands): 0 (05/28/2008)   Hgb: 14.8 (12/10/2009)   HCT: 43.9 (12/10/2009)   Platelets: 239 (12/10/2009) HIV-1 RNA: 10400 (12/10/2009)   HBSAg: NEG (01/01/2009)  Problem # 2:  HYPERTENSION (ICD-401.9)  pt to re-start meds. Pt given 3 doses of HCTZ until he can get a refill. His updated medication list for this problem includes:    Hydrochlorothiazide 25 Mg Tabs (Hydrochlorothiazide) .[name]... Take 1 tablet by mouth once a day  Orders: HCTZ 25mg  tab [facility])  Other Orders: Est. Patient Level III (16109) Influenza Vaccine NON MCR (60454) Pneumococcal Vaccine (09811) Admin 1st Vaccine (91478) Future Orders: T-CD4SP ([facility]) (CD4SP) ... 03/24/2010 T-HIV Viral Load [phone]) ... 03/24/2010  T-Comprehensive Metabolic Panel [phone]) ... 03/24/2010 T-CBC w/Diff ([id number]) ... 03/24/2010 T-RPR (Syphilis) [phone]) ... 03/24/2010 T-Lipid Profile [phone]) ... 03/24/2010  Patient Instructions: 1)  Please schedule a follow-up appointment in 3 months, 2 weeks after labs.  Prescriptions: HYDROCHLOROTHIAZIDE 25 MG TABS (HYDROCHLOROTHIAZIDE) Take 1 tablet by mouth once a day  #30 x 5   Entered and Authorized by:   Yisroel Ramming MD   Signed by:   Yisroel Ramming MD on 12/24/2009   Method used:   Print then Give to Patient   RxID:   [id number]            Prevention For Positives: 12/24/2009    Safe sex practices discussed with patient. Condoms offered.                              Immunizations Administered:  Influenza Vaccine # 1:    Vaccine Type: Fluvax Non-MCR    Site: left deltoid    Mfr: Novartis    Dose: 0.5 ml    Route: IM    Given by: Wendall Mola CMA ( AAMA)    Exp. Date: 06/22/2010    Lot #: [id number]    VIS given: 10/15/09 version given December 24, 2009.  Pneumonia Vaccine:    Vaccine Type: Pneumovax    Site: right deltoid    Mfr: Merck    Dose: 0.5 ml    Route: IM    Given by: Wendall Mola CMA ( AAMA)    Exp. Date: 06/07/2011    Lot #: [id number]    VIS given: 02/25/09 version given December 24, 2009.  Flu Vaccine Consent Questions:    Do you have a history of severe allergic reactions to this vaccine? no    Any prior history of allergic reactions to egg and/or gelatin? no    Do you have a sensitivity to the preservative Thimersol? no    Do you have a past history of Guillan-Barre Syndrome? no    Do you currently have an acute febrile illness? no    Have you ever had a severe reaction to latex? no    Vaccine information given and explained to patient? yes    Medication Administration  Medication # 1:    Medication: HCTZ 25mg  tab    Diagnosis: HYPERTENSION (ICD-401.9)    Dose: 1 tablet    Route: po    Exp Date: 08/22/2010    Lot #: [id number]    Mfr: [employer]    Comments: Pt. given three tablets until he can fill med.    Patient tolerated medication without complications    Given by: Wendall Mola CMA Duncan Dull) (December 24, 2009 4:07 PM)  Orders Added: 1)  T-CD4SP [facility]) [CD4SP] 2)  T-HIV Viral Load [phone] 3)  T-Comprehensive Metabolic Panel [[id number]] 4)  T-CBC w/Diff [[id number]] 5)  T-RPR (Syphilis) [01093-23557] 6)  T-Lipid Profile [80061-22930] 7)  Est. Patient Level III [32202] 8)  Influenza Vaccine NON MCR [00028] 9)  Pneumococcal Vaccine [90732] 10)  Admin 1st Vaccine [90471] 11)  HCTZ  25mg  tab [EMRORAL]

## 2010-04-22 NOTE — Progress Notes (Signed)
Summary: med refill  Phone Note Refill Request Message from:  Fax from Pharmacy on May 24, 2009 11:19 AM  Refills Requested: Medication #1:  LISINO-HCTZ 20-12.5 MG TAB Take one tablet by mouth every day.   Dosage confirmed as above?Dosage Confirmed   Brand Name Necessary? No   Supply Requested: 1 month  Method Requested: Telephone to Pharmacy Next Appointment Scheduled: seen 05/02/09 Initial call taken by: Sharen Heck RN,  May 24, 2009 11:23 AM    New/Updated Medications: * LISINO-HCTZ 20-12.5 MG TAB Take one tablet by mouth every day Prescriptions: LISINO-HCTZ 20-12.5 MG TAB Take one tablet by mouth every day  #30 x 6   Entered by:   Sharen Heck RN   Authorized by:   Yisroel Ramming MD   Signed by:   Yisroel Ramming MD on 05/27/2009   Method used:   Telephoned to ...         RxID:   [id number]  RX called to [facility] # [id number]. 05/16/09 Sharen Heck RN

## 2010-04-22 NOTE — Miscellaneous (Signed)
Summary: clinical update/ryan white  Clinical Lists Changes  Observations: Added new observation of PAYOR: No Insurance (10/02/2009 14:23) Added new observation of AIDSDAP: No (10/02/2009 14:23) Added new observation of PCTFPL: 62.36  (10/02/2009 14:23) Added new observation of INCOMESOURCE: Unemployment  (10/02/2009 14:23) Added new observation of HOUSEINCOME: 6754  (10/02/2009 14:23) Added new observation of HOUSING: Stable/permanent  (10/02/2009 14:23) Added new observation of FINASSESSDT: 10/02/2009  (10/02/2009 14:23) Added new observation of MARITAL STAT: Divorced  (10/02/2009 14:23)

## 2010-04-22 NOTE — Miscellaneous (Signed)
Summary: RW Update  Clinical Lists Changes  Observations: Added new observation of DATE1STVISIT: 08/21/2009 (10/16/2009 12:10) Added new observation of RWPARTICIP: Yes (10/16/2009 12:10)

## 2010-04-24 NOTE — Assessment & Plan Note (Addendum)
Summary: RESEARCH    Current Allergies: No known allergies  Vital Signs:  Patient profile:   60 year old male Height:      69 inches (175.26 cm) Weight:      158.25 pounds (71.93 kg) Temp:     97.3 degrees F oral Pulse rate:   71 / minute Resp:     16 per minute  Serial Vital Signs/Assessments:  Time      Position  BP       Pulse  Resp  Temp     By           R Arm     160/90                         Deirdre Evener RN           L Arm     146/89                         Deirdre Evener RN   Patient here to screen for the START study. He had taken the consent home to read over prior to this visit. We spent a considerable amount of time discussing the study. I answered all his questions regarding it. Blood was collected for the initial CD4 and other labs. When he returns in a couple weeks we will do the EKG, neuro evaluation, urine and repeat blood work. He has taken the questionnaires home with him to complete there.Deirdre Evener RN  April 17, 2010 1:44 PM    Other Orders: Est. Patient Research Study [phone]) T-HIV Antibody  (Reflex) [phone]) T-Comprehensive Metabolic Panel [phone]) T-HIV Viral Load [phone]) T-Hepatitis B Surface Antigen [phone]) T-Hepatitis B Surface Antibody ([id number]) T-Hepatitis B Core Antibody ([id number])

## 2010-05-06 ENCOUNTER — Ambulatory Visit (INDEPENDENT_AMBULATORY_CARE_PROVIDER_SITE_OTHER): Payer: Self-pay

## 2010-05-06 ENCOUNTER — Encounter: Payer: Self-pay | Admitting: Adult Health

## 2010-05-06 DIAGNOSIS — B20 Human immunodeficiency virus [HIV] disease: Secondary | ICD-10-CM

## 2010-05-06 LAB — CONVERTED CEMR LAB
BUN: 19 mg/dL (ref 6–23)
Blood in Urine, dipstick: NEGATIVE
CD4 Count: 451 microliters
CO2: 25 meq/L (ref 19–32)
Calcium: 9.5 mg/dL (ref 8.4–10.5)
Chloride: 103 meq/L (ref 96–112)
Cholesterol: 194 mg/dL (ref 0–200)
Creatinine, Ser: 1.14 mg/dL (ref 0.40–1.50)
Glucose, Bld: 93 mg/dL (ref 70–99)
Glucose, Urine, Semiquant: NEGATIVE
HDL: 31 mg/dL — ABNORMAL LOW (ref >39)
Hep B Core Total Ab: POSITIVE — AB
Hep B S Ab: NEGATIVE
Hepatitis B Surface Ag: NEGATIVE
LDL Cholesterol: 131 mg/dL — ABNORMAL HIGH (ref 0–99)
Nitrite: NEGATIVE
Potassium: 4.2 meq/L (ref 3.5–5.3)
Protein, U semiquant: NEGATIVE
Sodium: 139 meq/L (ref 135–145)
Specific Gravity, Urine: 1.025
Total CHOL/HDL Ratio: 6.3
Triglycerides: 160 mg/dL — ABNORMAL HIGH (ref <150)
Urobilinogen, UA: >=8
VLDL: 32 mg/dL (ref 0–40)
WBC Urine, dipstick: NEGATIVE
pH: 6

## 2010-05-09 ENCOUNTER — Ambulatory Visit: Payer: Self-pay

## 2010-05-13 ENCOUNTER — Telehealth: Payer: Self-pay | Admitting: Adult Health

## 2010-05-14 ENCOUNTER — Ambulatory Visit: Payer: Self-pay | Admitting: Adult Health

## 2010-05-14 NOTE — Assessment & Plan Note (Signed)
   Vital Signs:  Patient profile:   60 year old male Weight:      158 pounds (71.82 kg) Temp:     97.5 degrees F oral Pulse rate:   84 / minute Resp:     16 per minute Is Patient Diabetic? No Research Study Name: START Pain Assessment Patient in pain? no      Nutritional Status BMI of 19 -24 = normal  Does patient need assistance? Functional Status Self care Ambulation Normal   Serial Vital Signs/Assessments:  Time      Position  BP       Pulse  Resp  Temp     By           R Arm     146/96                         Deirdre Evener RN           L Arm     136/83                         Deirdre Evener RN   Allergies: No Known Drug Allergies  Patient here to complete screening for the START study. He had returned his questionnaires. He had eaten last at 8pm the night before so we were able to get fasting labs. EKG was obtained. He also completed the neuro tests. Grooved pegboard results were 81 sec. on rt hand, 1 dropped. Lt hand was 96 sec., 0 dropped. He denies any new complaints or new meds. After the CD4 results return, I will call him to confirm his willingness to enter the study, then we will have him randomized on the START study. If he receives medication therapy, he will come back in to discuss with [facility] which meds to start on.Kim Epperson RN  May 06, 2010 11:52 AM       Laboratory Results   Urine Tests  Date/Time Recieved: 05/06/10 11am Date/Time Reported: 05/06/10 11:26am  Routine Urinalysis   Color: orange Appearance: Clear Glucose: negative   (Normal Range: Negative) Bilirubin: small   (Normal Range: Negative) Ketone: trace (5)   (Normal Range: Negative) Spec. Gravity: 1.025   (Normal Range: 1.003-1.035) Blood: negative   (Normal Range: Negative) pH: 6.0   (Normal Range: 5.0-8.0) Protein: negative   (Normal Range: Negative) Urobilinogen: >=8.0   (Normal Range: 0-1) Nitrite: negative   (Normal Range: Negative) Leukocyte Esterace: negative   (Normal Range:  Negative)      Deirdre Evener RN  May 06, 2010 11:54 AM

## 2010-05-14 NOTE — Miscellaneous (Signed)
Summary: Orders Update  Clinical Lists Changes  Orders: Added new Test order of T-Hepatitis B Surface Antigen [phone]) - Signed Added new Test order of T-Hepatitis B Core Antibody [phone]) - Signed Added new Test order of T-Hepatitis B Surface Antibody [phone]) - Signed Added new Test order of T-Basic Metabolic Panel [phone]) - Signed Added new Test order of T-Lipid Profile ([id number]) - Signed Added new Test order of T-Urinalysis Dipstick only ([id number]) - Signed

## 2010-05-14 NOTE — Miscellaneous (Signed)
Summary: Orders Update  Clinical Lists Changes  Orders: Added new Test order of T-HIV Genotype ([id number]) - Signed  

## 2010-05-15 ENCOUNTER — Encounter: Payer: Self-pay | Admitting: Adult Health

## 2010-05-15 ENCOUNTER — Ambulatory Visit (INDEPENDENT_AMBULATORY_CARE_PROVIDER_SITE_OTHER): Payer: Self-pay | Admitting: Adult Health

## 2010-05-15 DIAGNOSIS — B20 Human immunodeficiency virus [HIV] disease: Secondary | ICD-10-CM

## 2010-05-16 ENCOUNTER — Encounter: Payer: Self-pay | Admitting: Adult Health

## 2010-05-20 NOTE — Progress Notes (Signed)
  Patient came to clinic today and asked to speak with me. He has decided to withdraw from the START study. Since his second CD4 is 451, we would to repeat it for him to be eligible. He wishes to begin meds since his CD4 has started to decline. I did offer him particiopation in study 5303 but he declined. he is scheduling an appt. to see brad to discuss meds.Deirdre Evener RN  May 13, 2010 10:31 AM

## 2010-05-20 NOTE — Miscellaneous (Addendum)
  Clinical Lists Changes  Observations: Added new observation of CD4 COUNT: 451 microliters (05/06/2010 13:57) Added new observation of CD4 COUNT: 578 microliters (04/17/2010 13:57)

## 2010-05-20 NOTE — Miscellaneous (Signed)
Summary: CD4 (RESEARCH)  Clinical Lists Changes

## 2010-05-20 NOTE — Assessment & Plan Note (Signed)
Summary: 60month f/u/vs   CC:  follow-up visit, patient feeling anxious and having trouble sleeping, and he is concerned abt BP.  History of Present Illness: In for routine f/u.  Voices no complaints today.  Wants to know if he needs to start ARV therapy.  Preventive Screening-Counseling & Management  Alcohol-Tobacco     Alcohol drinks/day: 0     Smoking Status: current     Packs/Day: <0.25     Year Started: 1970     Passive Smoke Exposure: No  Caffeine-Diet-Exercise     Caffeine use/day: 2     Does Patient Exercise: yes     Type of exercise: walks     Exercise (avg: min/session): 30-60     Times/week: 3   Current Allergies: No known allergies  Past History:  Past medical, surgical, family and social histories (including risk factors) reviewed for relevance to current acute and chronic problems.  Past Medical History: Reviewed history from 01/17/2009 and no changes required. Hepatitis C HIV disease Hyperlipidemia Hypertension  Family History: Reviewed history and no changes required.  Social History: Reviewed history and no changes required.  Review of Systems  The patient denies anorexia, fever, weight loss, weight gain, vision loss, decreased hearing, hoarseness, chest pain, syncope, dyspnea on exertion, peripheral edema, prolonged cough, headaches, hemoptysis, abdominal pain, melena, hematochezia, severe indigestion/heartburn, hematuria, incontinence, genital sores, muscle weakness, suspicious skin lesions, transient blindness, difficulty walking, depression, unusual weight change, abnormal bleeding, enlarged lymph nodes, angioedema, and testicular masses.    Vital Signs:  Patient profile:   60 year old male Height:      67 inches Weight:      160.50 pounds Temp:     97.3 degrees F oral Pulse rate:   76 / minute BP sitting:   157 / 93  (right arm)  Vitals Entered By: Alesia Morin CMA (April 14, 2010 3:23 PM) CC: follow-up visit, patient feeling anxious  and having trouble sleeping, he is concerned abt BP Is Patient Diabetic? No Pain Assessment Patient in pain? no      Nutritional Status BMI of 19 -24 = normal Nutritional Status Detail appetite "good"  Have you ever been in a relationship where you felt threatened, hurt or afraid?No   Does patient need assistance? Functional Status Self care Ambulation Normal Comments no missed doses of medication   Physical Exam  General:  alert, well-developed, well-nourished, and well-hydrated.   Head:  normocephalic and atraumatic.   Eyes:  vision grossly intact, pupils equal, pupils round, and pupils reactive to light.   Ears:  R ear normal and L ear normal.   Nose:  no external deformity, no external erythema, and no nasal discharge.   Mouth:  good dentition, no gingival abnormalities, no dental plaque, and pharynx pink and moist.   Neck:  supple, full ROM, and no masses.   Chest Wall:  no deformities, no tenderness, and no masses.   Lungs:  normal respiratory effort and normal breath sounds.   Heart:  normal rate, regular rhythm, no murmur, no gallop, and no rub.   Abdomen:  soft, non-tender, normal bowel sounds, no hepatomegaly, and no splenomegaly.   Rectal:  no external abnormalities, no hemorrhoids, and normal sphincter tone.   Genitalia:  Testes bilaterally descended without nodularity, tenderness or masses. No scrotal masses or lesions. No penis lesions or urethral discharge. Prostate:  no gland enlargement, no nodules, and no asymmetry.   Msk:  normal ROM, no joint tenderness, and no joint swelling.  Extremities:  No clubbing, cyanosis, edema, or deformity noted with normal full range of motion of all joints.   Neurologic:  alert & oriented X3, cranial nerves II-XII intact, strength normal in all extremities, and gait normal.   Skin:  turgor normal, color normal, no rashes, and no suspicious lesions.   Cervical Nodes:  No lymphadenopathy noted Axillary Nodes:  No palpable  lymphadenopathy Inguinal Nodes:  No significant adenopathy Psych:  Cognition and judgment appear intact. Alert and cooperative with normal attention span and concentration. No apparent delusions, illusions, hallucinations   Impression & Recommendations:  Problem # 1:  HIV DISEASE (ICD-042)  Most recent CD4 is CD4 is 640 @ 32% with VL of 18,3000 copies/ml.  Given age and health hx would consider treatment, but offerred an alternative by being evaluated for either the START study or the 5303 study.  He agreed to entertain this and a referral was made to Deirdre Evener RN from research regarding this.  We will currently defer any further f/u to research until he begins any ascribed study regimens.  Verbally acknowledged this and agreed with plan  Orders: Est. Patient Level IV (84696)  Medications Added to Medication List This Visit: 1)  Trazodone Hcl 50 Mg Tabs (Trazodone hcl) .[name].. 1 -2 tabs by mouth at bedtime as needed sleep. Prescriptions: TRAZODONE HCL 50 MG TABS (TRAZODONE HCL) 1 -2 tabs by mouth at bedtime as needed sleep.  #45 x 1   Entered and Authorized by:   Talmadge Chad NP   Signed by:   Talmadge Chad NP on 04/14/2010   Method used:   Print then Give to Patient   RxID:   [id number]   Prevention & Chronic Care Immunizations   Influenza vaccine: Fluvax Non-MCR  (12/24/2009)    Tetanus booster: Not documented    Pneumococcal vaccine: Pneumovax  (12/24/2009)  Colorectal Screening   Hemoccult: Not documented    Colonoscopy: Not documented  Other Screening   PSA: 5.03  (05/28/2008)   Smoking status: current  (04/14/2010)  Lipids   Total Cholesterol: 178  (03/25/2010)   LDL: 118  (03/25/2010)   LDL Direct: Not documented   HDL: 25  (03/25/2010)   Triglycerides: 175  (03/25/2010)    SGOT (AST): 32  (03/25/2010)   SGPT (ALT): 35  (03/25/2010)   Alkaline phosphatase: 75  (03/25/2010)   Total bilirubin: 0.7  (03/25/2010)  Hypertension   Last Blood  Pressure: 157 / 93  (04/14/2010)   Serum creatinine: 1.07  (03/25/2010)   Serum potassium 4.1  (03/25/2010)  Self-Management Support :    Hypertension self-management support: Not documented    Lipid self-management support: Not documented

## 2010-05-20 NOTE — Assessment & Plan Note (Signed)
Summary: problem? want talk w/Brad   Vital Signs:  Patient profile:   60 year old male Height:      69 inches Weight:      160.4 pounds BMI:     23.77 Temp:     98 degrees F oral Pulse rate:   78 / minute BP sitting:   147 / 92  (right arm)  Vitals Entered By: Alesia Morin CMA (May 15, 2010 10:41 AM) CC: Pt needs to talk to [facility] about the research program and some other issues  Is Patient Diabetic? No Pain Assessment Patient in pain? no      Nutritional Status BMI of 19 -24 = normal Nutritional Status Detail appetite "good"  Have you ever been in a relationship where you felt threatened, hurt or afraid?No   Does patient need assistance? Functional Status Self care Ambulation Normal Comments no missed meds   CC:  Pt needs to talk to [facility] about the research program and some other issues .  History of Present Illness: Wanted to discuss possibility of starting ARV therapy as his CD4 count continues to decline.  Coincerned he might develop AIDS.  Denies any constitutional symptoms.  Preventive Screening-Counseling & Management  Alcohol-Tobacco     Alcohol drinks/day: 0     Smoking Status: current     Packs/Day: <0.25     Year Started: 1970     Passive Smoke Exposure: No  Caffeine-Diet-Exercise     Caffeine use/day: 2     Does Patient Exercise: yes     Type of exercise: walks     Exercise (avg: min/session): 30-60     Times/week: 3  Safety-Violence-Falls     Seat Belt Use: yes      Sexual History:  currently monogamous.        Drug Use:  former, heroin, and clean for 3.5 years 2008.        Blood Transfusions:  no.        Travel History:  no.    Comments: pt declined condoms  Allergies: No Known Drug Allergies  Social History: Sexual History:  currently monogamous Blood Transfusions:  no Travel History:  no  Review of Systems  The patient denies anorexia, fever, weight loss, weight gain, vision loss, decreased hearing, hoarseness, chest pain,  syncope, dyspnea on exertion, peripheral edema, prolonged cough, headaches, hemoptysis, abdominal pain, melena, hematochezia, severe indigestion/heartburn, hematuria, incontinence, genital sores, muscle weakness, suspicious skin lesions, transient blindness, difficulty walking, depression, unusual weight change, abnormal bleeding, enlarged lymph nodes, angioedema, breast masses, and testicular masses.    Physical Exam  General:  alert, well-developed, well-nourished, and well-hydrated.   Head:  normocephalic and atraumatic.   Eyes:  vision grossly intact, pupils equal, pupils round, and pupils reactive to light.   Mouth:  good dentition and pharynx pink and moist.   Neck:  supple and full ROM.   Lungs:  normal respiratory effort and normal breath sounds.   Heart:  normal rate and regular rhythm.   Abdomen:  soft, non-tender, and normal bowel sounds.   Msk:  normal ROM, no joint tenderness, and no joint swelling.   Extremities:  No clubbing, cyanosis, edema, or deformity noted with normal full range of motion of all joints.   Neurologic:  alert & oriented X3, cranial nerves II-XII intact, strength normal in all extremities, and gait normal.   Skin:  color normal, no rashes, and no suspicious lesions.   Cervical Nodes:  No lymphadenopathy noted Axillary Nodes:  No palpable lymphadenopathy Psych:  Cognition and judgment appear intact. Alert and cooperative with normal attention span and concentration. No apparent delusions, illusions, hallucinations   Impression & Recommendations:  Problem # 1:  HIV DISEASE (ICD-042)  Most recent CD4 is 480 @ 32% and previously 520 @32 %.  VL 10,400 copies/ml.  Given his age, in spite of stability in CD4%, treatment can still be warranted.  We reviewed various treatment strategies and he he chose Truvada 200/300 once daily, and Isentress 400mg  once daily.  We reviewed medication SE's, ADR's and potential toxicities.  Verbally afcknowledged information, questions  answered, and agreed with plan.  We will iunitially nrefer him to Kandice Robinsons for ADAP eligibility and then follow-through with PAP applications.  He is to schedule labs 4 weeks following the start of his regimen and f/u visit with me in 6 weeks.  Verbally acknowledged this information and agreed with plan.  Orders: Est. Patient Level III (21308) T-CBC w/Diff [phone]) T-CD4SP ([facility]) (CD4SP) T-Comprehensive Metabolic Panel [phone]) T-HIV Viral Load [phone])  Medications Added to Medication List This Visit: 1)  Truvada 200-300 Mg Tabs (Emtricitabine-tenofovir) .... Take one (1) tablet once a day 2)  Isentress 400 Mg Tabs (Raltegravir potassium) .... Take one (1) tablet every twelve (12) hours  Other Orders: T-Lipid Profile ([id number])   Orders Added: 1)  Est. Patient Level III [63875] 2)  T-CBC w/Diff [[id number]] 3)  T-CD4SP [facility]) [CD4SP] 4)  T-Comprehensive Metabolic Panel [[id number]] 5)  T-HIV Viral Load [id number] 6)  T-Lipid Profile [[id number]]

## 2010-05-29 NOTE — Miscellaneous (Signed)
Summary: CD4 (RESEARCH)  Clinical Lists Changes  Observations: Added new observation of CD4 COUNT: 578 microliters (04/17/2010 13:52)

## 2010-06-02 LAB — T-HELPER CELL (CD4) - (RCID CLINIC ONLY)
CD4 % Helper T Cell: 32 % — ABNORMAL LOW (ref 33–55)
CD4 T Cell Abs: 640 uL (ref 400–2700)

## 2010-06-05 LAB — T-HELPER CELL (CD4) - (RCID CLINIC ONLY)
CD4 % Helper T Cell: 34 % (ref 33–55)
CD4 T Cell Abs: 680 uL (ref 400–2700)

## 2010-06-10 LAB — POCT I-STAT, CHEM 8
BUN: 11 mg/dL (ref 6–23)
Calcium, Ion: 1.14 mmol/L (ref 1.12–1.32)
Chloride: 107 mEq/L (ref 96–112)
Creatinine, Ser: 1 mg/dL (ref 0.4–1.5)
Glucose, Bld: 95 mg/dL (ref 70–99)
HCT: 47 % (ref 39.0–52.0)
Hemoglobin: 16 g/dL (ref 13.0–17.0)
Potassium: 3.8 mEq/L (ref 3.5–5.1)
Sodium: 142 mEq/L (ref 135–145)
TCO2: 26 mmol/L (ref 0–100)

## 2010-06-10 LAB — CBC
HCT: 44.3 % (ref 39.0–52.0)
Hemoglobin: 14.8 g/dL (ref 13.0–17.0)
MCHC: 33.3 g/dL (ref 30.0–36.0)
MCV: 90.3 fL (ref 78.0–100.0)
Platelets: 225 10*3/uL (ref 150–400)
RBC: 4.91 MIL/uL (ref 4.22–5.81)
RDW: 13.3 % (ref 11.5–15.5)
WBC: 5 10*3/uL (ref 4.0–10.5)

## 2010-06-10 LAB — DIFFERENTIAL
Basophils Absolute: 0 10*3/uL (ref 0.0–0.1)
Basophils Relative: 0 % (ref 0–1)
Eosinophils Absolute: 0.1 10*3/uL (ref 0.0–0.7)
Eosinophils Relative: 2 % (ref 0–5)
Lymphocytes Relative: 32 % (ref 12–46)
Lymphs Abs: 1.6 10*3/uL (ref 0.7–4.0)
Monocytes Absolute: 0.6 10*3/uL (ref 0.1–1.0)
Monocytes Relative: 13 % — ABNORMAL HIGH (ref 3–12)
Neutro Abs: 2.6 10*3/uL (ref 1.7–7.7)
Neutrophils Relative %: 52 % (ref 43–77)

## 2010-06-10 LAB — T-HELPER CELL (CD4) - (RCID CLINIC ONLY)
CD4 % Helper T Cell: 32 % — ABNORMAL LOW (ref 33–55)
CD4 T Cell Abs: 540 uL (ref 400–2700)

## 2010-07-07 LAB — CBC
HCT: 43.5 % (ref 39.0–52.0)
Hemoglobin: 14.2 g/dL (ref 13.0–17.0)
MCV: 90.9 fL (ref 78.0–100.0)
Platelets: 235 10*3/uL (ref 150–400)
RBC: 4.78 MIL/uL (ref 4.22–5.81)
WBC: 3.6 10*3/uL — ABNORMAL LOW (ref 4.0–10.5)

## 2010-10-13 ENCOUNTER — Other Ambulatory Visit: Payer: Self-pay

## 2010-10-13 DIAGNOSIS — B20 Human immunodeficiency virus [HIV] disease: Secondary | ICD-10-CM

## 2010-10-13 DIAGNOSIS — Z79899 Other long term (current) drug therapy: Secondary | ICD-10-CM

## 2010-10-14 LAB — CBC WITH DIFFERENTIAL/PLATELET
Basophils Absolute: 0 10*3/uL (ref 0.0–0.1)
Basophils Relative: 1 % (ref 0–1)
Eosinophils Absolute: 0.1 10*3/uL (ref 0.0–0.7)
Eosinophils Relative: 4 % (ref 0–5)
HCT: 42.5 % (ref 39.0–52.0)
Hemoglobin: 13.9 g/dL (ref 13.0–17.0)
Lymphocytes Relative: 53 % — ABNORMAL HIGH (ref 12–46)
Lymphs Abs: 2.1 10*3/uL (ref 0.7–4.0)
MCH: 29.6 pg (ref 26.0–34.0)
MCHC: 32.7 g/dL (ref 30.0–36.0)
MCV: 90.4 fL (ref 78.0–100.0)
Monocytes Absolute: 0.3 10*3/uL (ref 0.1–1.0)
Monocytes Relative: 7 % (ref 3–12)
Neutro Abs: 1.4 10*3/uL — ABNORMAL LOW (ref 1.7–7.7)
Neutrophils Relative %: 36 % — ABNORMAL LOW (ref 43–77)
Platelets: 265 10*3/uL (ref 150–400)
RBC: 4.7 MIL/uL (ref 4.22–5.81)
RDW: 13.7 % (ref 11.5–15.5)
WBC: 3.9 10*3/uL — ABNORMAL LOW (ref 4.0–10.5)

## 2010-10-14 LAB — COMPLETE METABOLIC PANEL WITH GFR
ALT: 34 U/L (ref 0–53)
AST: 27 U/L (ref 0–37)
Albumin: 3.8 g/dL (ref 3.5–5.2)
Alkaline Phosphatase: 77 U/L (ref 39–117)
BUN: 18 mg/dL (ref 6–23)
CO2: 25 mEq/L (ref 19–32)
Calcium: 8.9 mg/dL (ref 8.4–10.5)
Chloride: 109 mEq/L (ref 96–112)
Creat: 1.06 mg/dL (ref 0.50–1.35)
GFR, Est African American: >60 mL/min (ref >60)
GFR, Est Non African American: >60 mL/min (ref >60)
Glucose, Bld: 83 mg/dL (ref 70–99)
Potassium: 3.9 mEq/L (ref 3.5–5.3)
Sodium: 139 mEq/L (ref 135–145)
Total Bilirubin: 0.5 mg/dL (ref 0.3–1.2)
Total Protein: 7.6 g/dL (ref 6.0–8.3)

## 2010-10-14 LAB — LIPID PANEL
Cholesterol: 181 mg/dL (ref 0–200)
HDL: 27 mg/dL — ABNORMAL LOW (ref >39)
LDL Cholesterol: 129 mg/dL — ABNORMAL HIGH (ref 0–99)
Total CHOL/HDL Ratio: 6.7 Ratio
Triglycerides: 126 mg/dL (ref <150)
VLDL: 25 mg/dL (ref 0–40)

## 2010-10-14 LAB — HIV-1 RNA QUANT-NO REFLEX-BLD
HIV 1 RNA Quant: <20 copies/mL (ref <20)
HIV-1 RNA Quant, Log: <1.3 {Log} (ref <1.30)

## 2010-10-14 LAB — T-HELPER CELL (CD4) - (RCID CLINIC ONLY)
CD4 % Helper T Cell: 40 % (ref 33–55)
CD4 T Cell Abs: 800 uL (ref 400–2700)

## 2010-10-27 ENCOUNTER — Encounter: Payer: Self-pay | Admitting: Adult Health

## 2010-10-27 ENCOUNTER — Ambulatory Visit (INDEPENDENT_AMBULATORY_CARE_PROVIDER_SITE_OTHER): Payer: Self-pay | Admitting: Adult Health

## 2010-10-27 DIAGNOSIS — B356 Tinea cruris: Secondary | ICD-10-CM

## 2010-10-27 DIAGNOSIS — I10 Essential (primary) hypertension: Secondary | ICD-10-CM

## 2010-10-27 DIAGNOSIS — B20 Human immunodeficiency virus [HIV] disease: Secondary | ICD-10-CM

## 2010-10-27 DIAGNOSIS — L738 Other specified follicular disorders: Secondary | ICD-10-CM

## 2010-10-27 DIAGNOSIS — R21 Rash and other nonspecific skin eruption: Secondary | ICD-10-CM

## 2010-10-27 DIAGNOSIS — L739 Follicular disorder, unspecified: Secondary | ICD-10-CM | POA: Insufficient documentation

## 2010-10-27 DIAGNOSIS — B354 Tinea corporis: Secondary | ICD-10-CM

## 2010-10-27 MED ORDER — DOXYCYCLINE HYCLATE 100 MG PO TABS: 100.0000 mg | ORAL_TABLET | Freq: Two times a day (BID) | ORAL | Status: AC

## 2010-10-27 MED ORDER — FLUCONAZOLE 100 MG PO TABS: 100.0000 mg | ORAL_TABLET | Freq: Every day | ORAL | Status: AC

## 2010-10-27 MED ORDER — CLONIDINE HCL 0.1 MG PO TABS
0.1000 mg | ORAL_TABLET | Freq: Once | ORAL | Status: AC
  Administered 2010-10-27: 0.1 mg via ORAL

## 2010-10-27 MED ORDER — LISINOPRIL-HYDROCHLOROTHIAZIDE 20-12.5 MG PO TABS: 1.0000 | ORAL_TABLET | Freq: Every day | ORAL | Status: DC

## 2010-10-27 MED ORDER — CLONIDINE HCL 0.1 MG PO TABS: 0.1000 mg | ORAL_TABLET | Freq: Once | ORAL | Status: DC

## 2010-10-27 MED ORDER — NYSTATIN 100000 UNIT/GM EX CREA: TOPICAL_CREAM | Freq: Two times a day (BID) | CUTANEOUS | Status: DC

## 2010-10-27 NOTE — Patient Instructions (Addendum)
Tinea Cruris (Jock Itch, Fungal Infection of the Groin) Tinea Cruris (Jock Itch) is a fungal infection of the skin in the groin area. It is sometimes called "ringworm" even though it is not due to a worm. A fungus is a type of germ that thrives in dark, damp places.   CAUSES This infection may spread from:  A fungus infection elsewhere on the body (such as athlete's foot)   Sharing towels, clothing, etc.  This infection is more common in:  Hot, humid climates.   People who wear tight-fitting clothing or wet bathing suits for long periods of time.   Athletes.   Overweight people.   Diabetics.  SYMPTOMS Tinea cruris causes the following symptoms:  Red, pink or brown rash in the groin. Rash may spread to the thighs, anus and buttocks.   Itching  DIAGNOSIS Your caregiver may make the diagnosis by looking at the rash. Sometimes a skin scraping will be sent to test for fungus. Testing can be done either by looking under the microscope or by doing a culture (test to try to grow the fungus). A culture can take up to 2 weeks to come back. TREATMENT Tinea cruris may be treated with:  Skin cream or ointment to kill fungus.   Medicine by mouth to kill fungus.   Skin cream or ointment to calm the itching.   Compresses or medicated powders to dry the infected skin.  HOME CARE INSTRUCTIONS  Be sure to treat the rash completely - follow your caregiver's instructions. It can take a couple of weeks to treat. If you do not treat long enough, the rash can come back.   Wear loose fitting clothing.   Men should wear cotton boxer shorts   Women should wear cotton underwear.   Avoid hot baths.   Dry the groin area well after bathing.  SEEK MEDICAL CARE IF:  The rash is worse.   The rash is spreading.   The rash returns after treatment is finished.   The rash is not gone in 4 weeks. Fungal infections are slow to respond to treatment. Some redness may remain for several weeks after  the fungus is gone.   You develop a fever of more than 100.18F (38.1 C).  SEEK IMMEDIATE MEDICAL ATTENTION IF:  The area becomes red, warm, tender, and swollen.   An unexplained oral temperature above 102 develops.  Document Released: 02/27/2002 Document Re-Released: 06/03/2009 [facility] Patient Information 2011 [place], [place].  Ringworm - Body (Tinea Corporis) Ringworm is a fungal infection of the skin and hair. Another name for this problem is Tinea Corporis. It has nothing to do with worms. A fungus is an organism that lives on dead cells (the outer layer of skin). It can involve the entire body. It can spread from infected pets. Tinea corporis can be a problem in wrestlers who may get the infection form other players/opponents, equipment and mats. DIAGNOSIS A skin scraping can be obtained from the affected area and by looking for fungus under the microscope. This is called a KOH examination.   HOME CARE INSTRUCTIONS  Ringworm may be treated with a topical antifungal cream, ointment, or oral medications.   If you are using a cream or ointment, wash infected skin. Dry it completely before application.   Scrub the skin with a buff puff or abrasive sponge using a shampoo with ketoconazole to remove dead skin and help treat the ringworm.   Have your pet treated by your veterinarian if it has the  same infection.  SEEK MEDICAL CARE IF:  The ringworm patch (fungus) continues to spread after 7 days of treatment.   The rash is not gone in 4 weeks. Fungal infections are slow to respond to treatment. Some redness (erythema) may remain for several weeks after the fungus is gone.   The area becomes red, warm, tender, and swollen beyond the patch. This may be a secondary bacterial (germ) infection.   An oral temperature above 102 develops.  Document Released: 03/06/2000 Document Re-Released: 01/04/2009 [facility] Patient Information 2011 [place], [place].  Folliculitis  (Inflammation of Hair  Follicle) Folliculitis is an infection and inflammation of the hair follicles. Hair follicles become red and irritated. This inflammation is usually caused by bacteria. The bacteria thrive in warm, moist environments. This condition can be seen anywhere on the body.   CAUSES AND RISK FACTORS The most common cause of folliculitis is an infection by germs (bacteria). Fungal and viral infections can also cause the condition. Viral infections may be more common in people whose bodies are unable to fight disease well (weakened immune systems). Examples include people with:  AIDS.   An organ transplant.   Cancer.  People with depressed immune systems, diabetes, or obesity, have a greater risk of getting folliculitis than the general population. Certain chemicals, especially oils and tars, also can cause folliculitis. SYMPTOMS  An early sign of folliculitis is a small, white or yellow pus-filled, itchy lesion (pustule). These lesions appear on a red, inflamed follicle. They are usually less than 5 mm (.20 inches).   The most likely starting points are the scalp, thighs, legs, back and buttocks. Folliculitis is also frequently found in areas of repeated shaving.   When an infection of the follicle goes deeper, it becomes a boil or furuncle. A group of closely packed boils create a larger lesion (a carbuncle). These sores (lesions) tend to occur in hairy, sweaty areas of the body.  TREATMENT  A doctor who specializes in skin problems (dermatologists) treats mild cases of folliculitis with antiseptic washes.   They also use a skin application which kills germs (topical antibiotics). Tea tree oil is a good topical antiseptic as well. It can be found at a health food store. A small percentage of individuals may develop an allergy to the tea tree oil.   Mild to moderate boils respond well to warm water compresses applied three times daily.   In some cases, oral antibiotics should be taken with the skin  treatment.   If lesions contain large quantities of pus or fluid, your caregiver may drain them. This allows the topical antibiotics to get to the affected areas better.   Stubborn cases of folliculitis may respond to laser hair removal. This process uses a high intensity light beam (a laser) to destroy the follicle and reduces the scarring from folliculitis. After laser hair removal, hair will no longer grow in the laser treated area.  Patients with long-lasting folliculitis need to find out where the infection is coming from. Germs can live in the nostrils of the patient. This can trigger an outbreak now and then. Sometimes the bacteria live in the nostrils of a family member. This person does not develop the disorder but they repeatedly re-expose others to the germ. To break the cycle of recurrence in the patient, the family member must also undergo treatment. PREVENTION  Individuals who are predisposed to folliculitis should be extremely careful about personal hygiene.   Application of antiseptic washes may help prevent recurrences.  A topical antibiotic cream, mupirocin (Bactroban), has been effective at reducing bacteria in the nostrils. It is applied inside the nose with your little finger. This is done twice daily for a week. Then it is repeated every 6 months.   Because follicle disorders tend to come back, patients must receive follow-up care. Your caregiver may be able to recognize a recurrence before it becomes severe.  SEEK IMMEDIATE MEDICAL CARE IF:  You develop redness, swelling, or increasing pain in the area.   An unexplained oral temperature above 102 develops.   You are not improving with treatment or are getting worse.   You have any other questions or concerns.  Document Released: 05/18/2001 Document Re-Released: 06/03/2009 [facility] Patient Information 2011 [place], [place].

## 2010-10-30 ENCOUNTER — Telehealth: Payer: Self-pay | Admitting: *Deleted

## 2010-10-30 NOTE — Telephone Encounter (Signed)
Patient called to get the results of his RPR test. Advised him it was negative and reminded him to continue to use protection and be careful. He was relieved.

## 2010-10-31 NOTE — Progress Notes (Signed)
  Subjective:    Patient ID: Jamie Clark, male    DOB: [birth year], 60 y.o.   MRN: [id number]  HPI Presents to clinic for followup. Continues to be adherent to his medications with good tolerance and no complications. Does complain of rash over her arms, and on penis. Rash is pruritic. States is been there for approximately one week. Denies palmar rash or rash. On lower extremities or soles of feet. Last sexually active. 2 weeks ago.   Review of Systems  Constitutional: Negative.   HENT: Negative.   Eyes: Negative.   Respiratory: Negative.   Cardiovascular: Negative.   Gastrointestinal: Negative.   Genitourinary: Negative.   Musculoskeletal: Negative.   Skin: Positive for rash.  Neurological: Negative.   Hematological: Negative.   Psychiatric/Behavioral: Negative.        Objective:   Physical Exam  Constitutional: He is oriented to person, place, and time. He appears well-developed and well-nourished.  HENT:  Head: Normocephalic and atraumatic.  Right Ear: External ear normal.  Left Ear: External ear normal.  Nose: Nose normal.  Mouth/Throat: Oropharynx is clear and moist. No oropharyngeal exudate.  Eyes: Conjunctivae and EOM are normal. Pupils are equal, round, and reactive to light.  Neck: Normal range of motion. Neck supple.  Cardiovascular: Normal rate, regular rhythm, normal heart sounds and intact distal pulses.   Pulmonary/Chest: Effort normal and breath sounds normal.  Abdominal: Soft. Bowel sounds are normal.  Genitourinary: No penile tenderness.       Dry flaking, scaly rash noted to the glans penis, as well as punctate lesions noted to the, scrotal region.  Musculoskeletal: Normal range of motion.  Neurological: He is alert and oriented to person, place, and time. No cranial nerve deficit. He exhibits normal muscle tone. Coordination normal.  Skin: Skin is warm and dry. Rash noted.       Scattered papular rash noted to the bilateral forearms areas, they  appear without grouping or any specific pattern. They have erythematous bases and are firm no vesicle formation noted.  Psychiatric: He has a normal mood and affect. His behavior is normal. Judgment and thought content normal.          Assessment & Plan:

## 2010-11-06 ENCOUNTER — Encounter: Payer: Self-pay | Admitting: Adult Health

## 2010-11-06 ENCOUNTER — Ambulatory Visit: Payer: Self-pay | Admitting: Adult Health

## 2010-11-06 ENCOUNTER — Ambulatory Visit (INDEPENDENT_AMBULATORY_CARE_PROVIDER_SITE_OTHER): Payer: Self-pay | Admitting: Adult Health

## 2010-11-06 VITALS — BP 145/94 | HR 101 | Temp 97.8°F | Ht 67.0 in | Wt 160.0 lb

## 2010-11-06 DIAGNOSIS — B86 Scabies: Secondary | ICD-10-CM

## 2010-11-06 DIAGNOSIS — I10 Essential (primary) hypertension: Secondary | ICD-10-CM

## 2010-11-06 MED ORDER — PERMETHRIN 5 % EX CREA: TOPICAL_CREAM | Freq: Once | CUTANEOUS | Status: AC

## 2010-11-06 NOTE — Patient Instructions (Addendum)
Scabies Scabies are small bugs (mites) that burrow under the skin and cause red bumps and severe itching. These bugs can only be seen with a microscope.   Scabies are highly contagious. They can spread easily from person to person by direct contact. They are also spread through sharing clothing or linens that have the scabies mites living in them. It is not unusual for an entire family to become infected through shared towels, clothing, or bedding.   HOME CARE INSTRUCTIONS  Your caregiver may prescribe a cream or lotion to kill the mites. If this cream is prescribed; massage the cream into the entire area of the body from the neck to the bottom of both feet. Also massage the cream into the scalp and face if your child is less than 75 year old. Avoid the eyes and mouth.   Leave the cream on for 8 to12 hours. DO NOT wash your hands after application. Your child should bathe or shower after the 8 to 12 hour application period. Sometimes it is helpful to apply the cream to your child at right before bedtime.   One treatment is usually effective and will eliminate approximately 95% of infestations. For severe cases, your caregiver may decide to repeat the treatment in 1 week. Everyone in your household should be treated with one application of the cream.   New rashes or burrows should not appear after successful treatment within 24 to 48 hours; however the itching and rash may last for 2 to 4 weeks after successful treatment. If your symptoms persist longer than this, see your caregiver.   Your caregiver also may prescribe a medication to help with the itching or to help the rash go away more quickly.   Scabies can live on clothing or linens for up to 3 days. Your entire child's recently used clothing, towels, stuffed toys, and bed linens should be washed in hot water and then dried in a dryer for at least 20 minutes on high heat. Items that cannot be washed should be enclosed in a plastic bag for at least  3 days.   To help relieve itching, bathe your child in a COOL bath or apply cool washcloths to the affected areas.   Your child may return to school after treatment with the prescribed cream.  SEEK MEDICAL CARE IF:  The itching persists longer than 4 weeks after treatment.   The rash spreads or becomes infected (the area has red blisters or yellow-tan crust).  Document Released: 03/09/2005 Document Re-Released: 07/26/2008 [facility] Patient Information 2011 [place], [place].   Schedule 5-day return visit, but if skin condition improves, you may re-schedule for a 75-month follow-up with repeat labs 2 weeks before next appointment.

## 2010-11-06 NOTE — Progress Notes (Signed)
  Subjective:    Patient ID: Jamie Clark, male    DOB: February 21, 1951, 60 y.o.   MRN: [id number]  HPI Jamie Clark presents to clinic today for followup on his skin rash. States that the rash on his arm in the spaces between his fingers are worse, with increased pruritus, and some aching. The rash on his groin has improved significantly. However, there still some tenderness around his penis.   Review of Systems  Constitutional: Negative.   HENT: Negative.   Eyes: Negative.   Respiratory: Negative.   Cardiovascular: Negative.   Gastrointestinal: Negative.   Genitourinary: Negative.   Musculoskeletal: Negative.   Skin: Positive for rash. Negative for color change and wound.  Neurological: Negative.   Hematological: Negative.   Psychiatric/Behavioral: Negative.        Objective:   Physical Exam  Constitutional: He is oriented to person, place, and time. He appears well-developed and well-nourished. No distress.  HENT:  Head: Normocephalic and atraumatic.  Right Ear: External ear normal.  Left Ear: External ear normal.  Nose: Nose normal.  Mouth/Throat: Oropharynx is clear and moist. No oropharyngeal exudate.  Eyes: Conjunctivae and EOM are normal. Pupils are equal, round, and reactive to light. Right eye exhibits no discharge. Left eye exhibits no discharge.  Neck: Normal range of motion. Neck supple. No thyromegaly present.  Cardiovascular: Normal rate, regular rhythm, normal heart sounds and intact distal pulses.   Pulmonary/Chest: Effort normal and breath sounds normal.  Abdominal: Soft. Bowel sounds are normal.  Genitourinary:       Penile rash appears to be markedly improved. No further scaling noted. Some erythema noted around the glans penis, but otherwise clearing.  Musculoskeletal: Normal range of motion.  Neurological: He is alert and oriented to person, place, and time. He has normal reflexes. No cranial nerve deficit. He exhibits normal muscle tone. Coordination normal.    Skin: Skin is warm and dry.       Erythematous rash persists on the left forearm. There still appears to be some blistering formation, but no vesicle formation was noted. There still some erythematous papules noted on the inter-webbing so, the digits of his hands.  Psychiatric: He has a normal mood and affect. His behavior is normal. Judgment and thought content normal.          Assessment & Plan:  Suspect Scabies. Permethrin 5% cream topically to all skin areas x1. If symptoms are not improved within 5 days, he is to return to clinic for reevaluation. However, if they are improved, he may reschedule for followup in 4 months with labs 2 weeks before next appointment. If.  He verbally acknowledged this information and agreed with plan of care. Written information on self care and home care for scabies infestation. Was provided.

## 2010-11-11 ENCOUNTER — Ambulatory Visit (INDEPENDENT_AMBULATORY_CARE_PROVIDER_SITE_OTHER): Payer: Self-pay | Admitting: Adult Health

## 2010-11-11 ENCOUNTER — Encounter: Payer: Self-pay | Admitting: Adult Health

## 2010-11-11 ENCOUNTER — Telehealth: Payer: Self-pay | Admitting: *Deleted

## 2010-11-11 VITALS — BP 147/99 | HR 71 | Temp 97.3°F | Ht 67.0 in | Wt 159.0 lb

## 2010-11-11 DIAGNOSIS — B20 Human immunodeficiency virus [HIV] disease: Secondary | ICD-10-CM

## 2010-11-11 DIAGNOSIS — B354 Tinea corporis: Secondary | ICD-10-CM

## 2010-11-11 NOTE — Telephone Encounter (Signed)
Walked in asking to be seen. Rash on his arms has not abated & it itches badly. appt this pm

## 2010-11-13 ENCOUNTER — Ambulatory Visit: Payer: Self-pay

## 2010-11-18 ENCOUNTER — Encounter: Payer: Self-pay | Admitting: Adult Health

## 2010-11-18 ENCOUNTER — Ambulatory Visit (INDEPENDENT_AMBULATORY_CARE_PROVIDER_SITE_OTHER): Payer: Self-pay | Admitting: Adult Health

## 2010-11-18 VITALS — BP 132/93 | HR 82 | Temp 97.9°F | Wt 154.0 lb

## 2010-11-18 DIAGNOSIS — B356 Tinea cruris: Secondary | ICD-10-CM

## 2010-11-18 DIAGNOSIS — Z23 Encounter for immunization: Secondary | ICD-10-CM

## 2010-11-18 DIAGNOSIS — B20 Human immunodeficiency virus [HIV] disease: Secondary | ICD-10-CM

## 2010-11-18 DIAGNOSIS — L739 Follicular disorder, unspecified: Secondary | ICD-10-CM

## 2010-11-18 DIAGNOSIS — B354 Tinea corporis: Secondary | ICD-10-CM

## 2010-11-18 DIAGNOSIS — L738 Other specified follicular disorders: Secondary | ICD-10-CM

## 2011-01-02 LAB — I-STAT 8, (EC8 V) (CONVERTED LAB)
Acid-Base Excess: 2
Chloride: 107
pCO2, Ven: 54.6 — ABNORMAL HIGH
pH, Ven: 7.343 — ABNORMAL HIGH

## 2011-01-02 LAB — URINALYSIS, ROUTINE W REFLEX MICROSCOPIC
Hgb urine dipstick: NEGATIVE
Ketones, ur: 15 — AB
Protein, ur: NEGATIVE
Urobilinogen, UA: 1

## 2011-01-02 LAB — CBC
MCHC: 33.8
MCV: 87.9
Platelets: 224
RDW: 13.4

## 2011-01-02 LAB — DIFFERENTIAL
Basophils Relative: 1
Eosinophils Absolute: 0.2
Monocytes Relative: 8
Neutrophils Relative %: 43

## 2011-01-02 LAB — POCT I-STAT CREATININE
Creatinine, Ser: 1.2
Operator id: 192351

## 2011-02-10 ENCOUNTER — Other Ambulatory Visit: Payer: Self-pay | Admitting: Infectious Diseases

## 2011-02-10 DIAGNOSIS — B20 Human immunodeficiency virus [HIV] disease: Secondary | ICD-10-CM

## 2011-02-17 ENCOUNTER — Other Ambulatory Visit: Payer: Self-pay

## 2011-02-17 ENCOUNTER — Other Ambulatory Visit: Payer: Self-pay | Admitting: Internal Medicine

## 2011-02-17 ENCOUNTER — Other Ambulatory Visit (INDEPENDENT_AMBULATORY_CARE_PROVIDER_SITE_OTHER): Payer: Self-pay

## 2011-02-17 DIAGNOSIS — B20 Human immunodeficiency virus [HIV] disease: Secondary | ICD-10-CM

## 2011-02-17 LAB — COMPREHENSIVE METABOLIC PANEL
BUN: 15 mg/dL (ref 6–23)
CO2: 22 mEq/L (ref 19–32)
Calcium: 9.2 mg/dL (ref 8.4–10.5)
Chloride: 109 mEq/L (ref 96–112)
Creat: 1.19 mg/dL (ref 0.50–1.35)
Glucose, Bld: 110 mg/dL — ABNORMAL HIGH (ref 70–99)

## 2011-02-17 LAB — CBC WITH DIFFERENTIAL/PLATELET
Basophils Absolute: 0 10*3/uL (ref 0.0–0.1)
Eosinophils Relative: 6 % — ABNORMAL HIGH (ref 0–5)
HCT: 45.7 % (ref 39.0–52.0)
Lymphocytes Relative: 52 % — ABNORMAL HIGH (ref 12–46)
Lymphs Abs: 1.7 10*3/uL (ref 0.7–4.0)
MCV: 89.6 fL (ref 78.0–100.0)
Monocytes Absolute: 0.3 10*3/uL (ref 0.1–1.0)
RDW: 12.8 % (ref 11.5–15.5)
WBC: 3.3 10*3/uL — ABNORMAL LOW (ref 4.0–10.5)

## 2011-02-18 LAB — T-HELPER CELL (CD4) - (RCID CLINIC ONLY)
CD4 % Helper T Cell: 38 % (ref 33–55)
CD4 T Cell Abs: 680 uL (ref 400–2700)

## 2011-02-19 LAB — HIV-1 RNA QUANT-NO REFLEX-BLD: HIV 1 RNA Quant: <20 copies/mL (ref <20)

## 2011-03-04 ENCOUNTER — Ambulatory Visit: Payer: Self-pay | Admitting: Infectious Diseases

## 2011-03-25 ENCOUNTER — Ambulatory Visit: Payer: Self-pay | Admitting: Infectious Diseases

## 2011-04-01 ENCOUNTER — Observation Stay (HOSPITAL_COMMUNITY)
Admission: EM | Admit: 2011-04-01 | Discharge: 2011-04-02 | Disposition: A | Discharge status: Home / Self Care | Payer: Self-pay | Attending: Emergency Medicine | Admitting: Emergency Medicine

## 2011-04-01 ENCOUNTER — Encounter (HOSPITAL_COMMUNITY): Payer: Self-pay | Admitting: Emergency Medicine

## 2011-04-01 ENCOUNTER — Emergency Department (HOSPITAL_COMMUNITY)
Admission: EM | Admit: 2011-04-01 | Discharge: 2011-04-01 | Disposition: A | Discharge status: Home / Self Care | Payer: Self-pay

## 2011-04-01 ENCOUNTER — Encounter: Payer: Self-pay | Admitting: Infectious Diseases

## 2011-04-01 ENCOUNTER — Ambulatory Visit (INDEPENDENT_AMBULATORY_CARE_PROVIDER_SITE_OTHER): Payer: Self-pay | Admitting: Infectious Diseases

## 2011-04-01 DIAGNOSIS — B20 Human immunodeficiency virus [HIV] disease: Secondary | ICD-10-CM

## 2011-04-01 DIAGNOSIS — I809 Phlebitis and thrombophlebitis of unspecified site: Secondary | ICD-10-CM

## 2011-04-01 DIAGNOSIS — M79609 Pain in unspecified limb: Principal | ICD-10-CM | POA: Insufficient documentation

## 2011-04-01 DIAGNOSIS — M79606 Pain in leg, unspecified: Secondary | ICD-10-CM

## 2011-04-01 HISTORY — DX: Essential (primary) hypertension: I10

## 2011-04-01 HISTORY — DX: Asymptomatic human immunodeficiency virus (hiv) infection status: Z21

## 2011-04-01 MED ORDER — SODIUM CHLORIDE 0.9 % IV BOLUS (SEPSIS)
1000.0000 mL | Freq: Once | INTRAVENOUS | Status: AC
  Administered 2011-04-02: 1000 mL via INTRAVENOUS

## 2011-04-01 NOTE — ED Provider Notes (Signed)
Dr Ninetta Lights called to let us know that he was sending patient in.  He was concerned about the possibility of DVT, PE and wants pt to get a doppler study and CT angio of the chest  Celene Kras, MD 04/01/11 1801

## 2011-04-01 NOTE — ED Notes (Signed)
Called x's 3 no answer

## 2011-04-01 NOTE — Assessment & Plan Note (Signed)
He appears to be doing. Well. Can f/u as prev schedule or depending on his ED eval.

## 2011-04-01 NOTE — Assessment & Plan Note (Addendum)
Pt has a hx of "plebitis". My concern is that currently he has DVT and his "cold" and pleuritic has a PE. Will send him to ED for eval (ultrasound and CTA). 98% SpO2.

## 2011-04-01 NOTE — ED Notes (Signed)
Pt c/o swelling to calf of left lower leg x's 3 days.  Was seen earlier by his MD and sent to ED for doppler study

## 2011-04-01 NOTE — Progress Notes (Signed)
  Subjective:    Patient ID: Jamie Clark, male    DOB: [birth year], 61 y.o.   MRN: [id number]  HPI 61 yo M with HIV+, on TRV/ISN. Last CD4 680, VL <20 (02-17-11). Here wearing a mask- has had a cold for 2 days. Felt feverish at home. Has a hx of phlebitis. Now with swelling of his L calf. Has had for 2 days as well. No problems with ART.     Review of Systems  Constitutional: Positive for fever. Negative for appetite change and unexpected weight change.  HENT: Positive for congestion, rhinorrhea and sinus pressure.   Respiratory: Negative for chest tightness and shortness of breath.   Cardiovascular: Positive for chest pain.       Chest pain with deep breathing.   Gastrointestinal: Negative for diarrhea and constipation.  Genitourinary: Negative for difficulty urinating.  Neurological: Negative for headaches.  Hematological: Does not bruise/bleed easily.       Objective:   Physical Exam  Constitutional: He appears well-developed and well-nourished.  HENT:  Head: Normocephalic.  Mouth/Throat: Abnormal dentition. Dental caries present. No oropharyngeal exudate.  Eyes: EOM are normal. Pupils are equal, round, and reactive to light.  Neck: Neck supple.  Cardiovascular: Normal rate, regular rhythm and normal heart sounds.   Pulmonary/Chest: Effort normal and breath sounds normal.  Abdominal: Soft. Bowel sounds are normal. There is no tenderness.  Musculoskeletal:       Legs: Lymphadenopathy:    He has no cervical adenopathy.          Assessment & Plan:

## 2011-04-01 NOTE — ED Notes (Signed)
Reg. St's 15 mins after pt signed in he stated that he had to leave but would be back later

## 2011-04-02 ENCOUNTER — Encounter (HOSPITAL_COMMUNITY): Payer: Self-pay | Admitting: Radiology

## 2011-04-02 ENCOUNTER — Emergency Department (HOSPITAL_COMMUNITY): Payer: Self-pay

## 2011-04-02 DIAGNOSIS — M7989 Other specified soft tissue disorders: Secondary | ICD-10-CM

## 2011-04-02 DIAGNOSIS — M79609 Pain in unspecified limb: Secondary | ICD-10-CM

## 2011-04-02 LAB — BASIC METABOLIC PANEL
BUN: 10 mg/dL (ref 6–23)
CO2: 26 mEq/L (ref 19–32)
Calcium: 9.4 mg/dL (ref 8.4–10.5)
Chloride: 103 mEq/L (ref 96–112)
Creatinine, Ser: 1.14 mg/dL (ref 0.50–1.35)
GFR calc Af Amer: 80 mL/min — ABNORMAL LOW (ref >90)
GFR calc non Af Amer: 69 mL/min — ABNORMAL LOW (ref >90)
Glucose, Bld: 104 mg/dL — ABNORMAL HIGH (ref 70–99)
Potassium: 3.5 mEq/L (ref 3.5–5.1)
Sodium: 137 mEq/L (ref 135–145)

## 2011-04-02 MED ORDER — IOHEXOL 350 MG/ML SOLN
100.0000 mL | Freq: Once | INTRAVENOUS | Status: AC | PRN
  Administered 2011-04-02: 100 mL via INTRAVENOUS

## 2011-04-02 MED ORDER — HYDROCODONE-ACETAMINOPHEN 5-325 MG PO TABS: 2.0000 | ORAL_TABLET | ORAL | Status: AC | PRN

## 2011-04-02 NOTE — ED Notes (Signed)
Jamie Clark with vascular lab is aware of pt.

## 2011-04-02 NOTE — ED Provider Notes (Signed)
History    60yM with R calf pain and swelling. Gradual onset about 3d ago. Persistent. Leg pain worse with movement. NO numbness, tingling or loss of strength. Denies hx of blood clot. No CP or SOB. Referred from outpt given concern for PE/DVT.  CSN: [id number]  Arrival date & time 04/01/11  2033   First MD Initiated Contact with Patient 04/01/11 2324      Chief Complaint  Patient presents with  . Leg Pain    (Consider location/radiation/quality/duration/timing/severity/associated sxs/prior treatment) HPI  Past Medical History  Diagnosis Date  . Hypertension   . HIV positive     History reviewed. No pertinent past surgical history.  No family history on file.  History  Substance Use Topics  . Smoking status: Current Everyday Smoker -- 0.5 packs/day    Types: Cigarettes  . Smokeless tobacco: Never Used  . Alcohol Use: No      Review of Systems  Review of symptoms negative unless otherwise noted in HPI.  Allergies  Review of patient's allergies indicates no known allergies.  Home Medications   Current Outpatient Rx  Name Route Sig Dispense Refill  . EMTRICITABINE-TENOFOVIR 200-300 MG PO TABS Oral Take 1 tablet by mouth daily.      [name] HYDROCHLOROTHIAZIDE 25 MG PO TABS Oral Take 25 mg by mouth daily.      [name] RALTEGRAVIR POTASSIUM 400 MG PO TABS Oral Take 400 mg by mouth 2 (two) times daily.      BP 177/99  Pulse 60  Temp(Src) 97.9 F (36.6 C) (Oral)  Resp 20  SpO2 95%  Physical Exam  Nursing note and vitals reviewed. Constitutional: He appears well-developed and well-nourished. No distress.  HENT:  Head: Normocephalic and atraumatic.  Eyes: Conjunctivae are normal. Pupils are equal, round, and reactive to light. Right eye exhibits no discharge. Left eye exhibits no discharge.  Neck: Neck supple.  Cardiovascular: Normal rate, regular rhythm and normal heart sounds.  Exam reveals no gallop and no friction rub.   No murmur heard. Pulmonary/Chest: Effort  normal and breath sounds normal. No respiratory distress.  Abdominal: Soft. He exhibits no distension. There is no tenderness.  Musculoskeletal: He exhibits tenderness. He exhibits no edema.       Tenderness in R calf. Calves symmetric and R leg grossly normal in appearance. Neurovascularly intact distally.  Neurological: He is alert.  Skin: Skin is warm and dry.  Psychiatric: He has a normal mood and affect. His behavior is normal. Thought content normal.    ED Course  Procedures (including critical care time)  Labs Reviewed  BASIC METABOLIC PANEL - Abnormal; Notable for the following:    Glucose, Bld 104 (*)    GFR calc non Af Amer 69 (*)    GFR calc Af Amer 80 (*)    All other components within normal limits   Ct Angio Chest W/cm &/or Wo Cm  04/02/2011  *RADIOLOGY REPORT*  Clinical Data: Pulmonary embolus.  CT ANGIOGRAPHY CHEST  Technique:  Multidetector CT imaging of the chest using the standard protocol during bolus administration of intravenous contrast. Multiplanar reconstructed images including MIPs were obtained and reviewed to evaluate the vascular anatomy.  Comparison: None.  Findings: Technically adequate study without pulmonary embolus.  No acute cardiopulmonary disease.  Dependent atelectasis in the lungs.  There is no axillary adenopathy.  No mediastinal or hilar adenopathy in this patient with HIV.  The heart appears normal. Incidental imaging of the upper abdomen is normal.  The small lipoma  is present in the posterior right diaphragm.  No pericardial or pleural effusion.  Bones appear within normal limits.  Three- vessel aortic arch grossly appears normal.  Bones appear within normal limits.  IMPRESSION: Negative CTA chest.  Original Report Authenticated By: Andreas Newport, M.D.     1. Leg pain       MDM  60yM with R leg pain. CT neg for PE. Discussed with pt lovenox and return for [place] vs CDU stay and [place] in am and pt opting for latter.         Raeford Razor,  MD 04/10/11 [phone]

## 2011-04-02 NOTE — ED Notes (Signed)
Pt states that he has been having a pain in his left lower leg for the past two weeks. Pt has a history of cellulitis but denies any cause for infection. Pt leg is swollen but not red nor is it warmer than the right leg to touch. Pt states that the pain is constant and that sharp and shooting. Pt denies any sensation loss. Pt able to move extremities and wiggle toes.

## 2011-04-02 NOTE — ED Notes (Signed)
Doppler study completed. Pt tolerated well

## 2011-04-02 NOTE — ED Notes (Signed)
Vascular tech at bedside to do dvt study

## 2011-04-02 NOTE — ED Provider Notes (Signed)
Medical screening examination/treatment/procedure(s) were performed by non-physician practitioner and as supervising physician I was immediately available for consultation/collaboration.  Juliet Rude. Rubin Payor, MD 04/02/11 1404

## 2011-04-02 NOTE — ED Provider Notes (Signed)
Patient here with left lower leg pain - will be getting doppler study this morning.  Continues with pain - CTA of chest negative for PE.  Will continue to monitor  Jasper C. [place], [place] 04/02/11 4010  Doppler negative for DVT, have asked the patient to continue to follow up with Dr. Ninetta Lights if he continues to have pain - there is no evidence of thromboemboli - will give short course of pain medication and he will return to Dr. Ninetta Lights.  Izola Price [place], [place] 04/02/11 [phone]

## 2011-04-02 NOTE — Progress Notes (Signed)
Left lower extremity venous duplex completed.  Preliminary report is negative for DVT, SVT, or a Baker's cyst in the left leg.  Negative for DVT in the right common femoral vein. Smiley Houseman 04/02/2011, 8:03 AM

## 2011-04-02 NOTE — ED Notes (Signed)
Pt ate 100% of breakfast tray.

## 2011-04-27 NOTE — Assessment & Plan Note (Signed)
Improved. Continue present management.

## 2011-04-27 NOTE — Assessment & Plan Note (Signed)
Abated.

## 2011-04-27 NOTE — Progress Notes (Signed)
Subjective:  Returns for follow-up on treatment of tinea infection and folliculitis.  Tinea rashes on chest and inguinal area are clearing and follicular rash abated after few days treatment.  Objective:  Clearing of chest and inguinal rash noted.  Follicular rash on arms clear.  Assessment/Plan:  Tinea cruris Improved.  Continue present management.  Tinea corporis Improved. Continue present management.  Folliculitis Abated.      Jamie Dyar A. Sundra Aland, MS, [facility] for Infectious Disease [phone]  04/27/2011,  12:00 PM

## 2011-05-05 NOTE — Progress Notes (Signed)
Subjective:  States rash that was evaluated 2 weeks ago. Has not improved with associated pruritus. While no new areas are reported. He states that the areas that he did have seemed to be worse.    Objective:  Tinea patches that were noted on arms, chest, and inguinal areas appear to actually be better than they were originally when he first complained of this problem.  Assessment/Plan:  Tinea corporis Encouraged to continue using nystatin cream. In followup in a week to 10 days      Jiali Linney A. Sundra Aland, MS, [facility] for Infectious Disease [phone]  05/05/2011,  8:05 PM

## 2011-05-05 NOTE — Assessment & Plan Note (Signed)
Encouraged to continue using nystatin cream. In followup in a week to 10 days

## 2011-05-11 ENCOUNTER — Other Ambulatory Visit: Payer: Self-pay

## 2011-05-12 ENCOUNTER — Other Ambulatory Visit: Payer: Self-pay | Admitting: Adult Health

## 2011-05-12 ENCOUNTER — Other Ambulatory Visit (INDEPENDENT_AMBULATORY_CARE_PROVIDER_SITE_OTHER): Payer: Self-pay

## 2011-05-12 DIAGNOSIS — B20 Human immunodeficiency virus [HIV] disease: Secondary | ICD-10-CM

## 2011-05-12 DIAGNOSIS — Z21 Asymptomatic human immunodeficiency virus [HIV] infection status: Secondary | ICD-10-CM

## 2011-05-13 LAB — CBC WITH DIFFERENTIAL/PLATELET
Eosinophils Absolute: 0.4 10*3/uL (ref 0.0–0.7)
Hemoglobin: 15.5 g/dL (ref 13.0–17.0)
Lymphocytes Relative: 48 % — ABNORMAL HIGH (ref 12–46)
Lymphs Abs: 2.3 10*3/uL (ref 0.7–4.0)
MCH: 31 pg (ref 26.0–34.0)
Monocytes Relative: 7 % (ref 3–12)
Neutrophils Relative %: 36 % — ABNORMAL LOW (ref 43–77)
RBC: 5 MIL/uL (ref 4.22–5.81)

## 2011-05-13 LAB — COMPREHENSIVE METABOLIC PANEL
Albumin: 4 g/dL (ref 3.5–5.2)
CO2: 24 mEq/L (ref 19–32)
Calcium: 9.1 mg/dL (ref 8.4–10.5)
Chloride: 110 mEq/L (ref 96–112)
Glucose, Bld: 72 mg/dL (ref 70–99)
Potassium: 4.2 mEq/L (ref 3.5–5.3)
Sodium: 142 mEq/L (ref 135–145)
Total Bilirubin: 0.8 mg/dL (ref 0.3–1.2)
Total Protein: 8.2 g/dL (ref 6.0–8.3)

## 2011-05-13 LAB — T-HELPER CELL (CD4) - (RCID CLINIC ONLY)
CD4 % Helper T Cell: 41 % (ref 33–55)
CD4 T Cell Abs: 960 uL (ref 400–2700)

## 2011-05-14 LAB — HIV-1 RNA QUANT-NO REFLEX-BLD: HIV 1 RNA Quant: <20 copies/mL (ref <20)

## 2011-05-25 ENCOUNTER — Ambulatory Visit: Payer: Self-pay | Admitting: Adult Health

## 2011-05-26 ENCOUNTER — Ambulatory Visit (INDEPENDENT_AMBULATORY_CARE_PROVIDER_SITE_OTHER): Payer: Self-pay | Admitting: Internal Medicine

## 2011-05-26 ENCOUNTER — Encounter: Payer: Self-pay | Admitting: Internal Medicine

## 2011-05-26 VITALS — BP 175/114 | HR 69 | Temp 97.8°F | Wt 165.0 lb

## 2011-05-26 DIAGNOSIS — B20 Human immunodeficiency virus [HIV] disease: Secondary | ICD-10-CM

## 2011-05-26 DIAGNOSIS — Z21 Asymptomatic human immunodeficiency virus [HIV] infection status: Secondary | ICD-10-CM

## 2011-05-26 DIAGNOSIS — I10 Essential (primary) hypertension: Secondary | ICD-10-CM

## 2011-05-26 LAB — LIPID PANEL: LDL Cholesterol: 128 mg/dL — ABNORMAL HIGH (ref 0–99)

## 2011-05-26 MED ORDER — AMLODIPINE BESYLATE 10 MG PO TABS: 10.0000 mg | ORAL_TABLET | Freq: Every day | ORAL | Status: DC

## 2011-05-26 NOTE — Progress Notes (Signed)
HIV ROUTINE FOLLOW UP VISIT  RFV = routine follow up/ new provider  Subjective:    Patient ID: Jamie Clark, male    DOB: 12/30/1950, 61 y.o.   MRN: [id number]  HPI Jamie Clark is a 61yo M with HIV/HCV, recent CD4 count of 920/VL <20 in Feb 2013, currently on truvada/raltegravir. He reports that he doesn't always takes RAL BID. It was noted by pharmacist at his last pick up who reinforced him to take it BID. We reviewed the importance of taking it twice a day. Otherwise he is doing well. He denies fever/chills/nightsweats/diarrhea. He does state that he is concerned that his blood pressure is elevated and that his current medicine isn't working.  Prior to Admission medications   Medication Sig Start Date End Date Taking? Authorizing Provider  emtricitabine-tenofovir (TRUVADA) 200-300 MG per tablet Take 1 tablet by mouth daily.     Yes Historical Provider, MD  raltegravir (ISENTRESS) 400 MG tablet Take 400 mg by mouth 2 (two) times daily.   Yes Historical Provider, MD  amLODipine (NORVASC) 10 MG tablet Take 1 tablet (10 mg total) by mouth daily. 05/26/11 05/25/12  Jamie Munson, MD  hydrochlorothiazide 25 MG tablet Take 25 mg by mouth daily.      Historical Provider, MD   History  Substance Use Topics  . Smoking status: Current Everyday Smoker -- 0.5 packs/day    Types: Cigarettes  . Smokeless tobacco: Never Used  . Alcohol Use: No  - he reports having 5 yr sobriety this past mid February, from heroin use.   Review of Systems Constitutional: Negative for fever, chills, diaphoresis, activity change, appetite change, fatigue and unexpected weight change.  HENT: Negative for congestion, sore throat, rhinorrhea, sneezing, trouble swallowing and sinus pressure.  Eyes: Negative for photophobia and visual disturbance.  Respiratory: Negative for cough, chest tightness, shortness of breath, wheezing and stridor.  Cardiovascular: Negative for chest pain, palpitations and leg swelling.    Gastrointestinal: Negative for nausea, vomiting, abdominal pain, diarrhea, constipation, blood in stool, abdominal distention and anal bleeding.  Genitourinary: Negative for dysuria, hematuria, flank pain and difficulty urinating.  Musculoskeletal: Negative for myalgias, back pain, joint swelling, arthralgias and gait problem.  Skin: Negative for color change, pallor, rash and wound.  Neurological: Negative for dizziness, tremors, weakness and light-headedness.  Hematological: Negative for adenopathy. Does not bruise/bleed easily.  Psychiatric/Behavioral: Negative for behavioral problems, confusion, sleep disturbance.positive dysphoric mood, decreased concentration and agitation.       Objective:   Physical Exam BP 175/114  Pulse 69  Temp(Src) 97.8 F (36.6 C) (Oral)  Wt 165 lb (74.844 kg) Physical Exam  Constitutional: He is oriented to person, place, and time. He appears well-developed and well-nourished. No distress.  HENT:  Mouth/Throat: Oropharynx is clear and moist. No oropharyngeal exudate.  Cardiovascular: Normal rate, regular rhythm and normal heart sounds. Exam reveals no gallop and no friction rub.  No murmur heard.  Pulmonary/Chest: Effort normal and breath sounds normal. No respiratory distress. He has no wheezes.  Abdominal: Soft. Bowel sounds are normal. He exhibits no distension. There is no tenderness.  Lymphadenopathy:  He has no cervical adenopathy.  Neurological: He is alert and oriented to person, place, and time.  Skin: Skin is warm and dry. No rash noted. No erythema.  Psychiatric: He has a normal mood and affect. His behavior is normal.          Assessment & Plan:  hiv - continue on truvada/ral, but reinforced to take RAL BID  Health  promotion - will check lipids today  htn - will add amlodipine 10mg  daily  Mood lability = will be meeting with alisa to decide what antidepressant is needed  HCV= will schedule RUQ at next visit to yearly  evaluation

## 2011-06-01 ENCOUNTER — Telehealth: Payer: Self-pay | Admitting: *Deleted

## 2011-06-01 NOTE — Telephone Encounter (Signed)
States Dr. Drue Second was to have rx'd a new med for him. He does not know what it is. Then he stated that he had just gotten off the phone with Alisa. I told him I will send a message to the md to add whatever drug it is. We will send to [facility] specialty if covered per formulary.

## 2011-06-08 ENCOUNTER — Encounter: Payer: Self-pay | Admitting: *Deleted

## 2011-06-15 ENCOUNTER — Other Ambulatory Visit: Payer: Self-pay | Admitting: *Deleted

## 2011-06-15 DIAGNOSIS — I10 Essential (primary) hypertension: Secondary | ICD-10-CM

## 2011-06-15 MED ORDER — AMLODIPINE BESYLATE 10 MG PO TABS: 10.0000 mg | ORAL_TABLET | Freq: Every day | ORAL | Status: DC

## 2011-06-16 ENCOUNTER — Ambulatory Visit (INDEPENDENT_AMBULATORY_CARE_PROVIDER_SITE_OTHER): Payer: Self-pay | Admitting: Internal Medicine

## 2011-06-16 ENCOUNTER — Telehealth: Payer: Self-pay | Admitting: *Deleted

## 2011-06-16 DIAGNOSIS — B20 Human immunodeficiency virus [HIV] disease: Secondary | ICD-10-CM

## 2011-06-16 NOTE — Telephone Encounter (Signed)
Patient called to see if his anti-anxiety medication had been called in yet. Advised no but will let the provider know he inquired.

## 2011-06-17 ENCOUNTER — Other Ambulatory Visit: Payer: Self-pay | Admitting: Internal Medicine

## 2011-06-17 DIAGNOSIS — B20 Human immunodeficiency virus [HIV] disease: Secondary | ICD-10-CM

## 2011-06-17 DIAGNOSIS — F329 Major depressive disorder, single episode, unspecified: Secondary | ICD-10-CM

## 2011-06-17 DIAGNOSIS — F32A Depression, unspecified: Secondary | ICD-10-CM

## 2011-06-17 MED ORDER — SERTRALINE HCL 50 MG PO TABS: 50.0000 mg | ORAL_TABLET | Freq: Every day | ORAL | Status: DC

## 2011-06-18 LAB — TB SKIN TEST
Induration: <5
TB Skin Test: NEGATIVE mm

## 2011-07-01 ENCOUNTER — Other Ambulatory Visit: Payer: Self-pay | Admitting: *Deleted

## 2011-07-01 DIAGNOSIS — Z113 Encounter for screening for infections with a predominantly sexual mode of transmission: Secondary | ICD-10-CM

## 2011-07-06 ENCOUNTER — Other Ambulatory Visit: Payer: Self-pay | Admitting: *Deleted

## 2011-07-06 DIAGNOSIS — B20 Human immunodeficiency virus [HIV] disease: Secondary | ICD-10-CM

## 2011-07-06 MED ORDER — RALTEGRAVIR POTASSIUM 400 MG PO TABS: 400.0000 mg | ORAL_TABLET | Freq: Two times a day (BID) | ORAL | Status: DC

## 2011-07-06 MED ORDER — EMTRICITABINE-TENOFOVIR DF 200-300 MG PO TABS: 1.0000 | ORAL_TABLET | Freq: Every day | ORAL | Status: DC

## 2011-08-04 NOTE — Progress Notes (Signed)
Patient ID: Jamie Clark, male   DOB: 02-27-1951, 61 y.o.   MRN: [id number] Tb skin test reading

## 2011-08-27 ENCOUNTER — Other Ambulatory Visit: Payer: Self-pay

## 2011-08-27 ENCOUNTER — Other Ambulatory Visit (INDEPENDENT_AMBULATORY_CARE_PROVIDER_SITE_OTHER): Payer: Self-pay

## 2011-08-27 DIAGNOSIS — B20 Human immunodeficiency virus [HIV] disease: Secondary | ICD-10-CM

## 2011-08-27 LAB — COMPREHENSIVE METABOLIC PANEL
ALT: 43 U/L (ref 0–53)
Albumin: 4.1 g/dL (ref 3.5–5.2)
Alkaline Phosphatase: 115 U/L (ref 39–117)
CO2: 22 mEq/L (ref 19–32)
Glucose, Bld: 101 mg/dL — ABNORMAL HIGH (ref 70–99)
Potassium: 3.9 mEq/L (ref 3.5–5.3)
Sodium: 138 mEq/L (ref 135–145)
Total Bilirubin: 0.6 mg/dL (ref 0.3–1.2)
Total Protein: 8.1 g/dL (ref 6.0–8.3)

## 2011-08-27 LAB — CBC WITH DIFFERENTIAL/PLATELET
Basophils Relative: 0 % (ref 0–1)
Hemoglobin: 14.9 g/dL (ref 13.0–17.0)
Lymphs Abs: 1.6 10*3/uL (ref 0.7–4.0)
Monocytes Relative: 10 % (ref 3–12)
Neutro Abs: 1.2 10*3/uL — ABNORMAL LOW (ref 1.7–7.7)
Neutrophils Relative %: 37 % — ABNORMAL LOW (ref 43–77)
Platelets: 241 10*3/uL (ref 150–400)
RBC: 4.97 MIL/uL (ref 4.22–5.81)
WBC: 3.2 10*3/uL — ABNORMAL LOW (ref 4.0–10.5)

## 2011-08-31 LAB — HIV-1 RNA QUANT-NO REFLEX-BLD: HIV-1 RNA Quant, Log: <1.3 {Log} (ref <1.30)

## 2011-09-10 ENCOUNTER — Ambulatory Visit (INDEPENDENT_AMBULATORY_CARE_PROVIDER_SITE_OTHER): Payer: Self-pay | Admitting: Internal Medicine

## 2011-09-10 ENCOUNTER — Encounter: Payer: Self-pay | Admitting: Internal Medicine

## 2011-09-10 VITALS — BP 147/102 | HR 75 | Temp 97.7°F | Wt 165.0 lb

## 2011-09-10 DIAGNOSIS — I10 Essential (primary) hypertension: Secondary | ICD-10-CM

## 2011-09-10 MED ORDER — HYDROCHLOROTHIAZIDE 25 MG PO TABS: 25.0000 mg | ORAL_TABLET | Freq: Every day | ORAL | Status: DC

## 2011-09-15 ENCOUNTER — Ambulatory Visit (INDEPENDENT_AMBULATORY_CARE_PROVIDER_SITE_OTHER): Payer: Self-pay | Admitting: *Deleted

## 2011-09-15 ENCOUNTER — Telehealth: Payer: Self-pay | Admitting: *Deleted

## 2011-09-15 DIAGNOSIS — H6120 Impacted cerumen, unspecified ear: Secondary | ICD-10-CM

## 2011-09-15 NOTE — Telephone Encounter (Signed)
He is here for dental. States Dr. Drue Second told him he could get his ears cleaned. I checked with Dr. Orvan Falconer who is here seeing pts. He is ok giving this order

## 2011-09-15 NOTE — Progress Notes (Signed)
Wax was seen in both ears. Used machine that lavages ear canal. Pt administered it himself with supervision & demonstration. Moderate amount removed from both ears. States he feels better & can hear better. Suggested otc Debrox to keep ears free of wax. States he will try it Tolerated procedure well & had good results

## 2011-10-05 ENCOUNTER — Emergency Department (INDEPENDENT_AMBULATORY_CARE_PROVIDER_SITE_OTHER)
Admission: EM | Admit: 2011-10-05 | Discharge: 2011-10-05 | Disposition: A | Discharge status: Home / Self Care | Payer: Self-pay | Source: Home / Self Care | Attending: Family Medicine | Admitting: Family Medicine

## 2011-10-05 ENCOUNTER — Encounter (HOSPITAL_COMMUNITY): Payer: Self-pay | Admitting: Emergency Medicine

## 2011-10-05 ENCOUNTER — Telehealth: Payer: Self-pay | Admitting: Licensed Clinical Social Worker

## 2011-10-05 DIAGNOSIS — L089 Local infection of the skin and subcutaneous tissue, unspecified: Secondary | ICD-10-CM

## 2011-10-05 MED ORDER — HYDROCODONE-ACETAMINOPHEN 5-325 MG PO TABS: 2.0000 | ORAL_TABLET | ORAL | Status: DC | PRN

## 2011-10-05 MED ORDER — CEPHALEXIN 500 MG PO CAPS: 500.0000 mg | ORAL_CAPSULE | Freq: Four times a day (QID) | ORAL | Status: AC

## 2011-10-05 MED ORDER — CEFTRIAXONE SODIUM 1 G IJ SOLR
1.0000 g | Freq: Once | INTRAMUSCULAR | Status: AC
  Administered 2011-10-05: 1 g via INTRAMUSCULAR

## 2011-10-05 MED ORDER — CEFTRIAXONE SODIUM 1 G IJ SOLR: 1.0000 g | INTRAMUSCULAR | Status: DC

## 2011-10-05 MED ORDER — CEPHALEXIN 500 MG PO CAPS: 500.0000 mg | ORAL_CAPSULE | Freq: Four times a day (QID) | ORAL | Status: DC

## 2011-10-05 MED ORDER — CEFTRIAXONE SODIUM 1 G IJ SOLR
INTRAMUSCULAR | Status: AC
  Filled 2011-10-05: qty 10

## 2011-10-05 MED ORDER — DOXYCYCLINE HYCLATE 100 MG PO CAPS: 100.0000 mg | ORAL_CAPSULE | Freq: Two times a day (BID) | ORAL | Status: DC

## 2011-10-05 MED ORDER — DOXYCYCLINE HYCLATE 100 MG PO CAPS: 100.0000 mg | ORAL_CAPSULE | Freq: Two times a day (BID) | ORAL | Status: AC

## 2011-10-05 NOTE — ED Provider Notes (Signed)
Medical screening examination/treatment/procedure(s) were performed by non-physician practitioner and as supervising physician I was immediately available for consultation/collaboration.   MORENO-COLL,Kynsli Haapala; MD   Ebrahim Deremer Moreno-Coll, MD 10/05/11 1734 

## 2011-10-05 NOTE — ED Notes (Signed)
Pt here with boil to right upper knee that formed x 2 dys ago.redness and slight tenderness noted and pain.

## 2011-10-05 NOTE — ED Provider Notes (Signed)
History     CSN: [id number]  Arrival date & time 10/05/11  1113   First MD Initiated Contact with Patient 10/05/11 1210      Chief Complaint  Patient presents with  . Recurrent Skin Infections    (Consider location/radiation/quality/duration/timing/severity/associated sxs/prior treatment) Patient is a 61 y.o. male presenting with abscess. The history is provided by the patient. No language interpreter was used.  Abscess  This is a new problem. The onset was sudden. The problem occurs continuously. The problem has been gradually worsening. The abscess is present on the right upper leg. The abscess is characterized by redness and itchiness. There were no sick contacts. Recently, medical care has been given by the PCP.  Pt complains of redness and swelling to right leg.    Past Medical History  Diagnosis Date  . Hypertension   . HIV positive     History reviewed. No pertinent past surgical history.  No family history on file.  History  Substance Use Topics  . Smoking status: Current Everyday Smoker -- 0.5 packs/day    Types: Cigarettes  . Smokeless tobacco: Never Used  . Alcohol Use: No      Review of Systems  Skin: Positive for wound.  All other systems reviewed and are negative.    Allergies  Review of patient's allergies indicates no known allergies.  Home Medications   Current Outpatient Rx  Name Route Sig Dispense Refill  . AMLODIPINE BESYLATE 10 MG PO TABS Oral Take 1 tablet (10 mg total) by mouth daily. 30 tablet 11  . EMTRICITABINE-TENOFOVIR 200-300 MG PO TABS Oral Take 1 tablet by mouth daily. 30 tablet 6  . HYDROCHLOROTHIAZIDE 25 MG PO TABS Oral Take 1 tablet (25 mg total) by mouth daily. 30 tablet 11  . RALTEGRAVIR POTASSIUM 400 MG PO TABS Oral Take 1 tablet (400 mg total) by mouth 2 (two) times daily. 60 tablet 6  . SERTRALINE HCL 50 MG PO TABS Oral Take 1 tablet (50 mg total) by mouth daily. 30 tablet 5    BP 143/90  Pulse 78  Temp 97.9 F (36.6  C) (Oral)  Resp 18  SpO2 96%  Physical Exam  Nursing note and vitals reviewed. Constitutional: He is oriented to person, place, and time. He appears well-developed and well-nourished.  Musculoskeletal: He exhibits tenderness.       Redness right leg,  Tender,  Pustule center  Neurological: He is alert and oriented to person, place, and time. He has normal reflexes.  Skin: There is erythema.  Psychiatric: He has a normal mood and affect.    ED Course  INCISION AND DRAINAGE Date/Time: 10/05/2011 12:15 PM Performed by: Elson Areas Authorized by: Sharin Grave Consent: Verbal consent not obtained. Risks and benefits: risks, benefits and alternatives were discussed Consent given by: patient Patient understanding: patient states understanding of the procedure being performed Patient identity confirmed: verbally with patient Time out: Immediately prior to procedure a "time out" was called to verify the correct patient, procedure, equipment, support staff and site/side marked as required. Type: abscess Body area: lower extremity Anesthesia: local infiltration Scalpel size: 11 Incision type: single straight Complexity: simple Wound treatment: wound left open Comments: Pustule open,  Hard pimple/blackhead, no purulent drainage   (including critical care time)  Labs Reviewed - No data to display No results found.   No diagnosis found.    MDM    Return in 2 days for recheck      Elson Areas, [place]  10/05/11 1228 

## 2011-10-05 NOTE — Telephone Encounter (Signed)
Patient called stating that the area over his knee is very painful, swollen, and warm to the touch. Per the patient it started as a "small hair bump" over the weekend and today it is painful to walk and very swollen. I advised the patient to go to urgent care here at [facility] to be seen, we did not have any available appointments, given his past hospitalization for cellulitis I didn't advise him waiting until this Wednesday for an appointment. Patient agreed and will go today.

## 2011-11-09 ENCOUNTER — Encounter (HOSPITAL_COMMUNITY): Payer: Self-pay | Admitting: *Deleted

## 2011-11-09 ENCOUNTER — Emergency Department (HOSPITAL_COMMUNITY)
Admission: EM | Admit: 2011-11-09 | Discharge: 2011-11-10 | Disposition: A | Discharge status: Home / Self Care | Payer: Self-pay | Attending: Emergency Medicine | Admitting: Emergency Medicine

## 2011-11-09 DIAGNOSIS — K529 Noninfective gastroenteritis and colitis, unspecified: Secondary | ICD-10-CM

## 2011-11-09 DIAGNOSIS — Z21 Asymptomatic human immunodeficiency virus [HIV] infection status: Secondary | ICD-10-CM | POA: Insufficient documentation

## 2011-11-09 DIAGNOSIS — Z79899 Other long term (current) drug therapy: Secondary | ICD-10-CM | POA: Insufficient documentation

## 2011-11-09 DIAGNOSIS — R112 Nausea with vomiting, unspecified: Secondary | ICD-10-CM | POA: Insufficient documentation

## 2011-11-09 DIAGNOSIS — I10 Essential (primary) hypertension: Secondary | ICD-10-CM | POA: Insufficient documentation

## 2011-11-09 DIAGNOSIS — F17200 Nicotine dependence, unspecified, uncomplicated: Secondary | ICD-10-CM | POA: Insufficient documentation

## 2011-11-09 DIAGNOSIS — R109 Unspecified abdominal pain: Secondary | ICD-10-CM | POA: Insufficient documentation

## 2011-11-09 LAB — POCT I-STAT, CHEM 8
Chloride: 100 mEq/L (ref 96–112)
Creatinine, Ser: 1.3 mg/dL (ref 0.50–1.35)
Glucose, Bld: 103 mg/dL — ABNORMAL HIGH (ref 70–99)
HCT: 53 % — ABNORMAL HIGH (ref 39.0–52.0)
Hemoglobin: 18 g/dL — ABNORMAL HIGH (ref 13.0–17.0)
Potassium: 3.5 mEq/L (ref 3.5–5.1)
Sodium: 141 mEq/L (ref 135–145)

## 2011-11-09 LAB — CBC WITH DIFFERENTIAL/PLATELET
Basophils Relative: 0 % (ref 0–1)
Hemoglobin: 16.9 g/dL (ref 13.0–17.0)
Lymphs Abs: 1.6 10*3/uL (ref 0.7–4.0)
Monocytes Relative: 7 % (ref 3–12)
Neutro Abs: 4.8 10*3/uL (ref 1.7–7.7)
Neutrophils Relative %: 69 % (ref 43–77)
Platelets: 332 10*3/uL (ref 150–400)
RBC: 5.38 MIL/uL (ref 4.22–5.81)

## 2011-11-09 MED ORDER — SODIUM CHLORIDE 0.9 % IV BOLUS (SEPSIS)
1000.0000 mL | Freq: Once | INTRAVENOUS | Status: AC
  Administered 2011-11-10: 1000 mL via INTRAVENOUS

## 2011-11-09 MED ORDER — FENTANYL CITRATE 0.05 MG/ML IJ SOLN: 25.0000 ug | INTRAMUSCULAR | Status: DC | PRN

## 2011-11-09 MED ORDER — ONDANSETRON HCL 4 MG/2ML IJ SOLN
4.0000 mg | Freq: Once | INTRAMUSCULAR | Status: AC
  Administered 2011-11-10: 4 mg via INTRAVENOUS
  Filled 2011-11-09: qty 2

## 2011-11-09 NOTE — ED Provider Notes (Signed)
History     CSN: [id number]  Arrival date & time 11/09/11  1729   First MD Initiated Contact with Patient 11/09/11 2348      Chief Complaint  Patient presents with  . Abdominal Pain    (Consider location/radiation/quality/duration/timing/severity/associated sxs/prior treatment) HPI HX per PT. Is HIV positive. Developed ABD pain all over with associated N/V today. No blood in emesis. Multiple stools today but denies diarrhea. No blood in stool. No new medications, some chills, no recorded fever. No known sick contacts. Mod in severity. No flank or back pain,. No rash. No known agg or alleviating factors Past Medical History  Diagnosis Date  . Hypertension   . HIV positive     History reviewed. No pertinent past surgical history.  No family history on file.  History  Substance Use Topics  . Smoking status: Current Everyday Smoker -- 0.5 packs/day    Types: Cigarettes  . Smokeless tobacco: Never Used  . Alcohol Use: No      Review of Systems  Constitutional: Positive for chills. Negative for fever.  HENT: Negative for neck pain and neck stiffness.   Eyes: Negative for visual disturbance.  Respiratory: Negative for shortness of breath.   Cardiovascular: Negative for chest pain.  Gastrointestinal: Positive for nausea, vomiting and abdominal pain.  Genitourinary: Negative for dysuria and flank pain.  Musculoskeletal: Negative for back pain.  Skin: Negative for rash.  Neurological: Negative for headaches.  All other systems reviewed and are negative.    Allergies  Review of patient's allergies indicates no known allergies.  Home Medications   Current Outpatient Rx  Name Route Sig Dispense Refill  . AMLODIPINE BESYLATE 10 MG PO TABS Oral Take 10 mg by mouth at bedtime.    [name] EMTRICITABINE-TENOFOVIR 200-300 MG PO TABS Oral Take 1 tablet by mouth at bedtime.     [name] HYDROCHLOROTHIAZIDE 25 MG PO TABS Oral Take 25 mg by mouth at bedtime.    [name] RALTEGRAVIR POTASSIUM 400  MG PO TABS Oral Take 1 tablet (400 mg total) by mouth 2 (two) times daily. 60 tablet 6  . SERTRALINE HCL 50 MG PO TABS Oral Take 50 mg by mouth at bedtime.      BP 121/83  Pulse 88  Temp 98.5 F (36.9 C) (Oral)  Resp 18  SpO2 100%  Physical Exam  Constitutional: He is oriented to person, place, and time. He appears well-developed and well-nourished.  HENT:  Head: Normocephalic and atraumatic.  Eyes: Conjunctivae and EOM are normal. Pupils are equal, round, and reactive to light.  Neck: Trachea normal. Neck supple. No thyromegaly present.  Cardiovascular: Normal rate, regular rhythm, S1 normal, S2 normal and normal pulses.     No systolic murmur is present   No diastolic murmur is present  Pulses:      Radial pulses are 2+ on the right side, and 2+ on the left side.  Pulmonary/Chest: Effort normal and breath sounds normal. He has no wheezes. He has no rhonchi. He has no rales. He exhibits no tenderness.  Abdominal: Soft. Normal appearance and bowel sounds are normal. He exhibits no distension and no mass. There is no rebound, no guarding, no CVA tenderness and negative Murphy's sign.       Mod diffuse tenderness, more so LLQ  Musculoskeletal:       BLE:s Calves nontender, no cords or erythema, negative Homans sign  Neurological: He is alert and oriented to person, place, and time. He has normal strength. No cranial  nerve deficit or sensory deficit. GCS eye subscore is 4. GCS verbal subscore is 5. GCS motor subscore is 6.  Skin: Skin is warm and dry. No rash noted. He is not diaphoretic.  Psychiatric: His speech is normal.       Cooperative and appropriate    ED Course  Procedures (including critical care time)  IVFs. IV zofran.   IV Fentanyl  Results for orders placed during the hospital encounter of 11/09/11  URINALYSIS, ROUTINE W REFLEX MICROSCOPIC      Component Value Range   Color, Urine AMBER (*) YELLOW   APPearance CLEAR  CLEAR   Specific Gravity, Urine 1.026  1.005  - 1.030   pH 6.0  5.0 - 8.0   Glucose, UA NEGATIVE  NEGATIVE mg/dL   Hgb urine dipstick NEGATIVE  NEGATIVE   Bilirubin Urine SMALL (*) NEGATIVE   Ketones, ur 15 (*) NEGATIVE mg/dL   Protein, ur 30 (*) NEGATIVE mg/dL   Urobilinogen, UA 2.0 (*) 0.0 - 1.0 mg/dL   Nitrite NEGATIVE  NEGATIVE   Leukocytes, UA NEGATIVE  NEGATIVE  CBC WITH DIFFERENTIAL      Component Value Range   WBC 7.0  4.0 - 10.5 K/uL   RBC 5.38  4.22 - 5.81 MIL/uL   Hemoglobin 16.9  13.0 - 17.0 g/dL   HCT 16.1  09.6 - 04.5 %   MCV 88.8  78.0 - 100.0 fL   MCH 31.4  26.0 - 34.0 pg   MCHC 35.4  30.0 - 36.0 g/dL   RDW 40.9  81.1 - 91.4 %   Platelets 332  150 - 400 K/uL   Neutrophils Relative 69  43 - 77 %   Neutro Abs 4.8  1.7 - 7.7 K/uL   Lymphocytes Relative 22  12 - 46 %   Lymphs Abs 1.6  0.7 - 4.0 K/uL   Monocytes Relative 7  3 - 12 %   Monocytes Absolute 0.5  0.1 - 1.0 K/uL   Eosinophils Relative 1  0 - 5 %   Eosinophils Absolute 0.1  0.0 - 0.7 K/uL   Basophils Relative 0  0 - 1 %   Basophils Absolute 0.0  0.0 - 0.1 K/uL  POCT I-STAT, CHEM 8      Component Value Range   Sodium 141  135 - 145 mEq/L   Potassium 3.5  3.5 - 5.1 mEq/L   Chloride 100  96 - 112 mEq/L   BUN 19  6 - 23 mg/dL   Creatinine, Ser 7.82  0.50 - 1.35 mg/dL   Glucose, Bld 956 (*) 70 - 99 mg/dL   Calcium, Ion 2.13  0.86 - 1.23 mmol/L   TCO2 29  0 - 100 mmol/L   Hemoglobin 18.0 (*) 13.0 - 17.0 g/dL   HCT 57.8 (*) 46.9 - 62.9 %  LIPASE, BLOOD      Component Value Range   Lipase 35  11 - 59 U/L  HEPATIC FUNCTION PANEL      Component Value Range   Total Protein 9.3 (*) 6.0 - 8.3 g/dL   Albumin 3.7  3.5 - 5.2 g/dL   AST 55 (*) 0 - 37 U/L   ALT 37  0 - 53 U/L   Alkaline Phosphatase 108  39 - 117 U/L   Total Bilirubin 0.7  0.3 - 1.2 mg/dL   Bilirubin, Direct <5.2  0.0 - 0.3 mg/dL   Indirect Bilirubin NOT CALCULATED  0.3 - 0.9 mg/dL  URINE MICROSCOPIC-ADD ON  Component Value Range   Squamous Epithelial / LPF RARE  RARE   WBC, UA  0-2  <3 WBC/hpf   RBC / HPF 0-2  <3 RBC/hpf   Bacteria, UA RARE  RARE   Casts HYALINE CASTS (*) NEGATIVE   Urine-Other MUCOUS PRESENT     Ct Abdomen Pelvis W Contrast  11/10/2011  *RADIOLOGY REPORT*  Clinical Data: Upper abdominal pain.  Vomiting and frequent bowel movements.  CT ABDOMEN AND PELVIS WITH CONTRAST  Technique:  Multidetector CT imaging of the abdomen and pelvis was performed following the standard protocol during bolus administration of intravenous contrast.  Contrast: OMNIPAQUE IOHEXOL 300 MG/ML  SOLN  Comparison: 09/25/2008  Findings: Dependent atelectasis and emphysematous changes in the lung bases.  Suggestion of thickening of the wall of the distal esophagus may represent reflux or esophagitis.  Sub centimeter cyst in the liver.  The gallbladder, pancreas, spleen, adrenal glands, kidneys, and retroperitoneal lymph nodes are unremarkable.  Calcification of the aorta without aneurysm.  No free air or free fluid in the abdomen.  Stomach and small bowel are not abnormally dilated.  Colon is diffusely fluid-filled with multiple air-fluid levels consistent with liquid stool.  This may represent inflammatory colitis although there is no significant wall thickening.  There is a large amount of stool in the rectosigmoid colon.  Pelvis:  Prostate gland is enlarged, measuring 4.5 x 6 cm.  Bladder wall is not thickened.  No free or loculated pelvic fluid collections.  The appendix is normal.  There is a small appendicolith at the appendiceal tip.  The appendix is otherwise normal without evidence of inflammatory process.  No significant lymphadenopathy in the pelvis.  No evidence of diverticulitis. Degenerative changes in the spine.  IMPRESSION: Prominent stool in the rectosigmoid colon with fluid filling the remainder the colon and air-fluid levels present.  Changes suggest liquid stool and may be related to colitis.  Appendicolith at the appendiceal tip without inflammatory change.  Prostate  enlargement.   Original Report Authenticated By: Marlon Pel, M.D.      IVFs. IV zofran.  Labs and CT scan.  IV ABx provided possible colitis in HIV positive male.   5:56 AM recheck feeling much better, no bowel obstruction, no emesis in the ED. Reliable historian agrees to close PCP follow up and is appropriate for d/c home. RX ABx, pain meds and phenergan provided.   MDM    Nursing notes reviewed. VS reviewed, old records reviewed.   Labs reviewed as above.  IV narcotics. IVFs. antiemetics  IV ABx    Sunnie Nielsen, MD 11/10/11 [phone]

## 2011-11-09 NOTE — ED Notes (Signed)
PT is here with upper abdominal pain that started this am.  Then patient developed vomiting with frequent small bowel movements.  Then had episode of diaphoresis

## 2011-11-10 ENCOUNTER — Encounter (HOSPITAL_COMMUNITY): Payer: Self-pay | Admitting: Radiology

## 2011-11-10 ENCOUNTER — Emergency Department (HOSPITAL_COMMUNITY): Payer: Self-pay

## 2011-11-10 LAB — URINALYSIS, ROUTINE W REFLEX MICROSCOPIC
Leukocytes, UA: NEGATIVE
Nitrite: NEGATIVE
Specific Gravity, Urine: 1.026 (ref 1.005–1.030)
Urobilinogen, UA: 2 mg/dL — ABNORMAL HIGH (ref 0.0–1.0)
pH: 6 (ref 5.0–8.0)

## 2011-11-10 LAB — HEPATIC FUNCTION PANEL
AST: 55 U/L — ABNORMAL HIGH (ref 0–37)
Albumin: 3.7 g/dL (ref 3.5–5.2)
Total Bilirubin: 0.7 mg/dL (ref 0.3–1.2)
Total Protein: 9.3 g/dL — ABNORMAL HIGH (ref 6.0–8.3)

## 2011-11-10 LAB — URINE MICROSCOPIC-ADD ON

## 2011-11-10 MED ORDER — METRONIDAZOLE 500 MG PO TABS: 500.0000 mg | ORAL_TABLET | Freq: Two times a day (BID) | ORAL | Status: AC

## 2011-11-10 MED ORDER — HYDROCODONE-ACETAMINOPHEN 5-500 MG PO TABS: 1.0000 | ORAL_TABLET | Freq: Four times a day (QID) | ORAL | Status: AC | PRN

## 2011-11-10 MED ORDER — PROMETHAZINE HCL 25 MG PO TABS: 25.0000 mg | ORAL_TABLET | Freq: Four times a day (QID) | ORAL | Status: DC | PRN

## 2011-11-10 MED ORDER — CIPROFLOXACIN IN D5W 400 MG/200ML IV SOLN
400.0000 mg | Freq: Once | INTRAVENOUS | Status: AC
  Administered 2011-11-10: 400 mg via INTRAVENOUS
  Filled 2011-11-10: qty 200

## 2011-11-10 MED ORDER — METRONIDAZOLE IN NACL 5-0.79 MG/ML-% IV SOLN
500.0000 mg | Freq: Once | INTRAVENOUS | Status: AC
  Administered 2011-11-10: 500 mg via INTRAVENOUS
  Filled 2011-11-10: qty 100

## 2011-11-10 MED ORDER — CIPROFLOXACIN HCL 500 MG PO TABS: 500.0000 mg | ORAL_TABLET | Freq: Two times a day (BID) | ORAL | Status: AC

## 2011-11-10 MED ORDER — IOHEXOL 300 MG/ML  SOLN
100.0000 mL | Freq: Once | INTRAMUSCULAR | Status: AC | PRN
  Administered 2011-11-10: 100 mL via INTRAVENOUS

## 2011-11-10 NOTE — ED Notes (Signed)
Pt has not yet finished drinking contrast.  Encouraged pt to continue drinking. Will continue to monitor

## 2011-11-10 NOTE — ED Notes (Signed)
Pt has finished drinking contrast. Informed CT.

## 2011-11-10 NOTE — ED Notes (Signed)
Patient transported to Ultrasound 

## 2011-11-10 NOTE — ED Notes (Signed)
Pt stated he is unable to provide urine sample.  Gave pt urinal and will continue to check back.

## 2011-11-24 ENCOUNTER — Ambulatory Visit (INDEPENDENT_AMBULATORY_CARE_PROVIDER_SITE_OTHER): Payer: Self-pay | Admitting: Internal Medicine

## 2011-11-24 ENCOUNTER — Encounter: Payer: Self-pay | Admitting: Internal Medicine

## 2011-11-24 VITALS — BP 130/87 | HR 80 | Temp 98.1°F | Wt 161.0 lb

## 2011-11-24 DIAGNOSIS — B20 Human immunodeficiency virus [HIV] disease: Secondary | ICD-10-CM

## 2011-11-24 NOTE — Progress Notes (Signed)
HIV CLINIC NOTE  RFV: routine follow up Subjective:    Patient ID: Jamie Clark, male    DOB: [birth year], 61 y.o.   MRN: [id number]  HPI 60yo HIV/HCV, CD 4 count 810/ VL <20, on truvada/raltegravir not missing any doses.  He has stopped taking zoloft not needed. He was taken to ED on 8/20 when he passed out at work, found to have colitis. preceeded by constipation. He was treated with metronidazole/cipro x 14 days, now better. He states that bloating is improved, no longer constipated, having regular BM.  Current Outpatient Prescriptions on File Prior to Visit  Medication Sig Dispense Refill  . amLODipine (NORVASC) 10 MG tablet Take 10 mg by mouth at bedtime.      [name] emtricitabine-tenofovir (TRUVADA) 200-300 MG per tablet Take 1 tablet by mouth at bedtime.       . hydrochlorothiazide (HYDRODIURIL) 25 MG tablet Take 25 mg by mouth at bedtime.      . raltegravir (ISENTRESS) 400 MG tablet Take 1 tablet (400 mg total) by mouth 2 (two) times daily.  60 tablet  6  . sertraline (ZOLOFT) 50 MG tablet Take 50 mg by mouth at bedtime.       Active Ambulatory Problems    Diagnosis Date Noted  . HIV DISEASE 01/17/2009  . HEPATITIS C 01/17/2009  . HYPERLIPIDEMIA 01/17/2009  . HYPERTENSION 01/17/2009  . INSOMNIA 04/14/2010  . Tinea cruris 10/27/2010  . Tinea corporis 10/27/2010  . Folliculitis 10/27/2010  . Phlebitis 04/01/2011   Resolved Ambulatory Problems    Diagnosis Date Noted  . No Resolved Ambulatory Problems   Past Medical History  Diagnosis Date  . Hypertension   . HIV positive    Social hx: smoking 1/2 PPD  Family hx: no colon ca hx  Review of Systems     Objective:   Physical Exam BP 130/87  Pulse 80  Temp 98.1 F (36.7 C) (Oral)  Wt 161 lb (73.029 kg) Physical Exam  Constitutional: He is oriented to person, place, and time. He appears well-developed and well-nourished. No distress.  HENT:  Mouth/Throat: Oropharynx is clear and moist. No oropharyngeal exudate.    Cardiovascular: Normal rate, regular rhythm and normal heart sounds. Exam reveals no gallop and no friction rub.  No murmur heard.  Pulmonary/Chest: Effort normal and breath sounds normal. No respiratory distress. He has no wheezes.  Abdominal: Soft. Bowel sounds are normal. He exhibits no distension. There is no tenderness.  Lymphadenopathy:  He has no cervical adenopathy.  Neurological: He is alert and oriented to person, place, and time.  Skin: Skin is warm and dry. No rash noted. No erythema.  Psychiatric: He has a normal mood and affect. His behavior is normal.       Assessment & Plan:  hiv = continue with truvada and raltegravir  Recent episode of colitis = now improved with antibiotics. Keeping hydrated and good   Health maintenance = colonoscopy referral.  rtc in 3 month, labs on oct

## 2011-11-25 ENCOUNTER — Ambulatory Visit: Payer: Self-pay | Admitting: Internal Medicine

## 2011-12-01 ENCOUNTER — Ambulatory Visit: Payer: Self-pay | Admitting: Internal Medicine

## 2011-12-08 ENCOUNTER — Ambulatory Visit: Payer: Self-pay

## 2011-12-10 ENCOUNTER — Telehealth: Payer: Self-pay

## 2011-12-10 NOTE — Telephone Encounter (Signed)
In the meantime, if unable to get colonoscopy, we will send fecal occult cards x 3 days to him.

## 2011-12-10 NOTE — Telephone Encounter (Signed)
Pt is calling to see if his colonoscopy has been scheduled.

## 2011-12-10 NOTE — Telephone Encounter (Signed)
i have to check to see if Jamie Clark has any insurance to refer him to [facility]; if no insurance, how do we get patients for colon screening?

## 2011-12-11 NOTE — Telephone Encounter (Signed)
Dr Drue Second   Could you please enter a referral for the patient to have a colonoscopy. He does not have insurance but next month [facility] is supposed to have openings per [facility]. I will try to get him in on that list early.

## 2011-12-14 ENCOUNTER — Other Ambulatory Visit: Payer: Self-pay | Admitting: Internal Medicine

## 2011-12-14 DIAGNOSIS — Z1211 Encounter for screening for malignant neoplasm of colon: Secondary | ICD-10-CM

## 2011-12-15 ENCOUNTER — Ambulatory Visit: Payer: Self-pay

## 2011-12-16 ENCOUNTER — Ambulatory Visit: Payer: Self-pay

## 2011-12-20 NOTE — Progress Notes (Signed)
HIV CLINIC NOTE   RFV: routine Subjective:    Patient ID: Jamie Clark, male    DOB: 1950-05-26, 61 y.o.   MRN: [id number]  HPI Jamie Clark is  61yo M with HIV, CD 4 count of 810/ VL <20, on raltegravir/truvada. He is doing well. Great adherence. He states that he needs refills on BP meds. Mood is doing ok, not needing SSRIs. 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  Current Outpatient Prescriptions on File Prior to Visit  Medication Sig Dispense Refill  . raltegravir (ISENTRESS) 400 MG tablet Take 1 tablet (400 mg total) by mouth 2 (two) times daily.  60 tablet  6  . amLODipine (NORVASC) 10 MG tablet Take 10 mg by mouth at bedtime.      [name] emtricitabine-tenofovir (TRUVADA) 200-300 MG per tablet Take 1 tablet by mouth at bedtime.       . hydrochlorothiazide (HYDRODIURIL) 25 MG tablet Take 25 mg by mouth at bedtime.      . sertraline (ZOLOFT) 50 MG tablet Take 50 mg by mouth at bedtime.       Active Ambulatory Problems    Diagnosis Date Noted  . HIV DISEASE 01/17/2009  . HEPATITIS C 01/17/2009  . HYPERLIPIDEMIA 01/17/2009  . HYPERTENSION 01/17/2009  . INSOMNIA 04/14/2010  . Tinea cruris 10/27/2010  . Tinea corporis 10/27/2010  . Folliculitis 10/27/2010  . Phlebitis 04/01/2011   Resolved Ambulatory Problems    Diagnosis Date Noted  . No Resolved Ambulatory Problems   Past Medical History  Diagnosis Date  . Hypertension   . HIV positive    History   Social History  . Marital Status: Divorced    Spouse Name: N/A    Number of Children: N/A  . Years of Education: N/A   Occupational History  . Not on file.   Social History Main Topics  . Smoking status: Current Every Day Smoker -- 0.5 packs/day    Types: Cigarettes  . Smokeless tobacco: Never Used  . Alcohol Use: No  . Drug Use: No  . Sexually Active: No     refused condoms   Other Topics Concern  . Not on file   Social History Narrative  . No narrative on file     Review of Systems  Constitutional:  Negative for fever, chills, diaphoresis, activity change, appetite change, fatigue and unexpected weight change.  HENT: Negative for congestion, sore throat, rhinorrhea, sneezing, trouble swallowing and sinus pressure.  Eyes: Negative for photophobia and visual disturbance.  Respiratory: Negative for cough, chest tightness, shortness of breath, wheezing and stridor.  Cardiovascular: Negative for chest pain, palpitations and leg swelling.  Gastrointestinal: Negative for nausea, vomiting, abdominal pain, diarrhea, constipation, blood in stool, abdominal distention and anal bleeding.  Genitourinary: Negative for dysuria, hematuria, flank pain and difficulty urinating.  Musculoskeletal: Negative for myalgias, back pain, joint swelling, arthralgias and gait problem.  Skin: Negative for color change, pallor, rash and wound.  Neurological: Negative for dizziness, tremors, weakness and light-headedness.  Hematological: Negative for adenopathy. Does not bruise/bleed easily.  Psychiatric/Behavioral: Negative for behavioral problems, confusion, sleep disturbance, dysphoric mood, decreased concentration and agitation.       Objective:   Physical Exam  BP 147/102  Pulse 75  Temp 97.7 F (36.5 C) (Oral)  Wt 165 lb (74.844 kg) Physical Exam  Constitutional: He is oriented to person, place, and time. He appears well-developed and well-nourished. No distress.  HENT:  Mouth/Throat: Oropharynx is clear and moist. No oropharyngeal exudate.  Cardiovascular: Normal rate,  regular rhythm and normal heart sounds. Exam reveals no gallop and no friction rub.  No murmur heard.  Pulmonary/Chest: Effort normal and breath sounds normal. No respiratory distress. He has no wheezes.  Abdominal: Soft. Bowel sounds are normal. He exhibits no distension. There is no tenderness.  Lymphadenopathy:  He has no cervical adenopathy.  Skin: Skin is warm and dry. No rash noted. No erythema.           Assessment & Plan:    hiv = continue with current regimen, he has excellent adherence  Health maintenance= to get flu vax at next visit  Hypertension = will give refills for his meds, may need to titrate up amlodipine  rtc in 4 months

## 2011-12-21 ENCOUNTER — Ambulatory Visit: Payer: Self-pay

## 2011-12-23 ENCOUNTER — Ambulatory Visit: Payer: Self-pay

## 2011-12-23 ENCOUNTER — Other Ambulatory Visit: Payer: Self-pay

## 2011-12-29 ENCOUNTER — Other Ambulatory Visit: Payer: Self-pay

## 2012-01-07 ENCOUNTER — Other Ambulatory Visit: Payer: Self-pay | Admitting: *Deleted

## 2012-01-07 DIAGNOSIS — B20 Human immunodeficiency virus [HIV] disease: Secondary | ICD-10-CM

## 2012-01-07 MED ORDER — EMTRICITABINE-TENOFOVIR DF 200-300 MG PO TABS: 1.0000 | ORAL_TABLET | Freq: Every day | ORAL | Status: DC

## 2012-01-07 MED ORDER — RALTEGRAVIR POTASSIUM 400 MG PO TABS: 400.0000 mg | ORAL_TABLET | Freq: Two times a day (BID) | ORAL | Status: DC

## 2012-01-12 ENCOUNTER — Ambulatory Visit: Payer: Self-pay

## 2012-01-12 ENCOUNTER — Ambulatory Visit: Payer: Self-pay | Admitting: Internal Medicine

## 2012-02-22 ENCOUNTER — Other Ambulatory Visit (INDEPENDENT_AMBULATORY_CARE_PROVIDER_SITE_OTHER): Payer: Self-pay

## 2012-02-22 ENCOUNTER — Ambulatory Visit: Payer: Self-pay

## 2012-02-22 DIAGNOSIS — Z79899 Other long term (current) drug therapy: Secondary | ICD-10-CM

## 2012-02-22 DIAGNOSIS — E785 Hyperlipidemia, unspecified: Secondary | ICD-10-CM

## 2012-02-22 DIAGNOSIS — Z113 Encounter for screening for infections with a predominantly sexual mode of transmission: Secondary | ICD-10-CM

## 2012-02-22 DIAGNOSIS — B20 Human immunodeficiency virus [HIV] disease: Secondary | ICD-10-CM

## 2012-02-22 LAB — LIPID PANEL
LDL Cholesterol: 128 mg/dL — ABNORMAL HIGH (ref 0–99)
Triglycerides: 181 mg/dL — ABNORMAL HIGH (ref <150)
VLDL: 36 mg/dL (ref 0–40)

## 2012-02-22 LAB — COMPREHENSIVE METABOLIC PANEL
Alkaline Phosphatase: 84 U/L (ref 39–117)
BUN: 20 mg/dL (ref 6–23)
CO2: 28 mEq/L (ref 19–32)
Creat: 1.15 mg/dL (ref 0.50–1.35)
Glucose, Bld: 76 mg/dL (ref 70–99)
Sodium: 136 mEq/L (ref 135–145)
Total Bilirubin: 0.8 mg/dL (ref 0.3–1.2)

## 2012-02-22 LAB — CBC WITH DIFFERENTIAL/PLATELET
Eosinophils Absolute: 0.1 10*3/uL (ref 0.0–0.7)
Eosinophils Relative: 3 % (ref 0–5)
HCT: 43.9 % (ref 39.0–52.0)
Hemoglobin: 15.5 g/dL (ref 13.0–17.0)
Lymphocytes Relative: 47 % — ABNORMAL HIGH (ref 12–46)
Lymphs Abs: 1.9 10*3/uL (ref 0.7–4.0)
MCH: 30.3 pg (ref 26.0–34.0)
MCV: 85.9 fL (ref 78.0–100.0)
Monocytes Absolute: 0.4 10*3/uL (ref 0.1–1.0)
Monocytes Relative: 10 % (ref 3–12)
Platelets: 306 10*3/uL (ref 150–400)
RBC: 5.11 MIL/uL (ref 4.22–5.81)
WBC: 4.1 10*3/uL (ref 4.0–10.5)

## 2012-02-22 NOTE — Addendum Note (Signed)
Addended by: Mariea Clonts D on: 02/22/2012 12:28 PM   Modules accepted: Orders

## 2012-02-23 LAB — URINALYSIS, ROUTINE W REFLEX MICROSCOPIC
Hgb urine dipstick: NEGATIVE
Nitrite: NEGATIVE
Specific Gravity, Urine: >1.03 — ABNORMAL HIGH (ref 1.005–1.030)
Urobilinogen, UA: 1 mg/dL (ref 0.0–1.0)

## 2012-02-23 LAB — URINALYSIS, MICROSCOPIC ONLY

## 2012-02-23 LAB — T-HELPER CELL (CD4) - (RCID CLINIC ONLY): CD4 % Helper T Cell: 43 % (ref 33–55)

## 2012-03-08 ENCOUNTER — Encounter: Payer: Self-pay | Admitting: Internal Medicine

## 2012-03-08 ENCOUNTER — Ambulatory Visit (INDEPENDENT_AMBULATORY_CARE_PROVIDER_SITE_OTHER): Payer: Self-pay | Admitting: Internal Medicine

## 2012-03-08 VITALS — BP 136/93 | HR 76 | Temp 97.5°F | Wt 166.0 lb

## 2012-03-08 DIAGNOSIS — Z23 Encounter for immunization: Secondary | ICD-10-CM

## 2012-03-08 DIAGNOSIS — I10 Essential (primary) hypertension: Secondary | ICD-10-CM

## 2012-03-08 DIAGNOSIS — G4700 Insomnia, unspecified: Secondary | ICD-10-CM

## 2012-03-08 MED ORDER — TRAZODONE 25 MG HALF TABLET: 25.0000 mg | ORAL_TABLET | Freq: Every day | ORAL | Status: DC

## 2012-03-08 MED ORDER — LISINOPRIL 20 MG PO TABS: 20.0000 mg | ORAL_TABLET | Freq: Every day | ORAL | Status: DC

## 2012-03-08 NOTE — Progress Notes (Signed)
hiv clinic note  Rfv: routine visit Subjective:    Patient ID: Jamie Clark, male    DOB: [birth year], 61 y.o.   MRN: [id number]  HPI 61 yo male with HIV CD 4 count of 810/ VL <20, truvada/raltegravir. Doing well. Denies having fever, chills, nightsweats. Getting referred to cardiology who adjusted his medicines.  No chest pain on exertion.   Current Outpatient Prescriptions on File Prior to Visit  Medication Sig Dispense Refill  . amLODipine (NORVASC) 10 MG tablet Take 10 mg by mouth at bedtime.      [name] emtricitabine-tenofovir (TRUVADA) 200-300 MG per tablet Take 1 tablet by mouth at bedtime.  30 tablet  6  . hydrochlorothiazide (HYDRODIURIL) 25 MG tablet Take 25 mg by mouth at bedtime.      . raltegravir (ISENTRESS) 400 MG tablet Take 1 tablet (400 mg total) by mouth 2 (two) times daily.  60 tablet  6  . sertraline (ZOLOFT) 50 MG tablet Take 50 mg by mouth at bedtime.       Active Ambulatory Problems    Diagnosis Date Noted  . HIV DISEASE 01/17/2009  . HEPATITIS C 01/17/2009  . HYPERLIPIDEMIA 01/17/2009  . HYPERTENSION 01/17/2009  . INSOMNIA 04/14/2010  . Tinea cruris 10/27/2010  . Tinea corporis 10/27/2010  . Folliculitis 10/27/2010  . Phlebitis 04/01/2011   Resolved Ambulatory Problems    Diagnosis Date Noted  . No Resolved Ambulatory Problems   Past Medical History  Diagnosis Date  . Hypertension   . HIV positive     Review of Systems 10 point ROS is negative    Objective:   Physical Exam  BP 136/93  Pulse 76  Temp 97.5 F (36.4 C) (Oral)  Wt 166 lb (75.297 kg) Physical Exam  Constitutional: He is oriented to person, place, and time. He appears well-developed and well-nourished. No distress.  HENT:  Mouth/Throat: Oropharynx is clear and moist. No oropharyngeal exudate.  Cardiovascular: Normal rate, regular rhythm and normal heart sounds. Exam reveals no gallop and no friction rub.  No murmur heard.  Pulmonary/Chest: Effort normal and breath sounds  normal. No respiratory distress. He has no wheezes.  Abdominal: Soft. Bowel sounds are normal. He exhibits no distension. There is no tenderness.  Lymphadenopathy:  He has no cervical adenopathy.  Neurological: He is alert and oriented to person, place, and time.  Skin: Skin is warm and dry. No rash noted. No erythema.      Assessment & Plan:  HIV = continue truvada/raltegravir  Hypertension= adding lisinopril 10mg  (1/2 tab of 20mg . Will repeat BP check in early January to see if need to titrate to 20mg  daily  Health maintenance = getting flu vaccine today.    rtc in jan BP check  And HIV visit 6 months, repeat labs

## 2012-04-06 ENCOUNTER — Encounter: Payer: Self-pay | Admitting: *Deleted

## 2012-04-06 ENCOUNTER — Ambulatory Visit: Payer: Self-pay | Admitting: *Deleted

## 2012-04-06 VITALS — BP 125/83

## 2012-04-06 DIAGNOSIS — I10 Essential (primary) hypertension: Secondary | ICD-10-CM

## 2012-05-02 ENCOUNTER — Other Ambulatory Visit: Payer: Self-pay | Admitting: *Deleted

## 2012-05-02 DIAGNOSIS — I10 Essential (primary) hypertension: Secondary | ICD-10-CM

## 2012-05-02 MED ORDER — AMLODIPINE BESYLATE 10 MG PO TABS: 10.0000 mg | ORAL_TABLET | Freq: Every day | ORAL | Status: DC

## 2012-05-04 ENCOUNTER — Ambulatory Visit: Payer: Self-pay | Admitting: Internal Medicine

## 2012-07-06 ENCOUNTER — Other Ambulatory Visit: Payer: Self-pay | Admitting: *Deleted

## 2012-07-06 DIAGNOSIS — I10 Essential (primary) hypertension: Secondary | ICD-10-CM

## 2012-07-06 DIAGNOSIS — B20 Human immunodeficiency virus [HIV] disease: Secondary | ICD-10-CM

## 2012-07-06 MED ORDER — HYDROCHLOROTHIAZIDE 25 MG PO TABS: 25.0000 mg | ORAL_TABLET | Freq: Every day | ORAL | Status: DC

## 2012-07-06 MED ORDER — EMTRICITABINE-TENOFOVIR DF 200-300 MG PO TABS: 1.0000 | ORAL_TABLET | Freq: Every day | ORAL | Status: DC

## 2012-07-06 MED ORDER — RALTEGRAVIR POTASSIUM 400 MG PO TABS: 400.0000 mg | ORAL_TABLET | Freq: Two times a day (BID) | ORAL | Status: DC

## 2012-08-25 ENCOUNTER — Other Ambulatory Visit (INDEPENDENT_AMBULATORY_CARE_PROVIDER_SITE_OTHER): Payer: Self-pay

## 2012-08-25 DIAGNOSIS — B20 Human immunodeficiency virus [HIV] disease: Secondary | ICD-10-CM

## 2012-08-25 LAB — COMPREHENSIVE METABOLIC PANEL
ALT: 67 U/L — ABNORMAL HIGH (ref 0–53)
AST: 51 U/L — ABNORMAL HIGH (ref 0–37)
Albumin: 3.8 g/dL (ref 3.5–5.2)
Alkaline Phosphatase: 78 U/L (ref 39–117)
Potassium: 3.6 mEq/L (ref 3.5–5.3)
Sodium: 136 mEq/L (ref 135–145)
Total Bilirubin: 0.9 mg/dL (ref 0.3–1.2)
Total Protein: 7.8 g/dL (ref 6.0–8.3)

## 2012-08-25 LAB — CBC WITH DIFFERENTIAL/PLATELET
Basophils Absolute: 0 10*3/uL (ref 0.0–0.1)
Eosinophils Relative: 4 % (ref 0–5)
Lymphocytes Relative: 54 % — ABNORMAL HIGH (ref 12–46)
Lymphs Abs: 2 10*3/uL (ref 0.7–4.0)
MCV: 86 fL (ref 78.0–100.0)
Neutro Abs: 1.2 10*3/uL — ABNORMAL LOW (ref 1.7–7.7)
Neutrophils Relative %: 32 % — ABNORMAL LOW (ref 43–77)
Platelets: 256 10*3/uL (ref 150–400)
RBC: 4.94 MIL/uL (ref 4.22–5.81)
RDW: 13.1 % (ref 11.5–15.5)
WBC: 3.7 10*3/uL — ABNORMAL LOW (ref 4.0–10.5)

## 2012-08-26 LAB — HIV-1 RNA QUANT-NO REFLEX-BLD
HIV 1 RNA Quant: 20 copies/mL (ref <20)
HIV-1 RNA Quant, Log: 1.3 {Log} (ref <1.30)

## 2012-09-08 ENCOUNTER — Encounter: Payer: Self-pay | Admitting: Internal Medicine

## 2012-09-08 ENCOUNTER — Ambulatory Visit (INDEPENDENT_AMBULATORY_CARE_PROVIDER_SITE_OTHER): Payer: Self-pay | Admitting: Internal Medicine

## 2012-09-08 VITALS — BP 131/87 | HR 64 | Temp 97.4°F | Wt 157.0 lb

## 2012-09-08 DIAGNOSIS — A630 Anogenital (venereal) warts: Secondary | ICD-10-CM

## 2012-09-08 MED ORDER — IMIQUIMOD 5 % EX CREA: TOPICAL_CREAM | CUTANEOUS | Status: DC

## 2012-09-26 ENCOUNTER — Encounter: Payer: Self-pay | Admitting: *Deleted

## 2012-09-27 ENCOUNTER — Encounter: Payer: Self-pay | Admitting: *Deleted

## 2012-09-27 ENCOUNTER — Ambulatory Visit (INDEPENDENT_AMBULATORY_CARE_PROVIDER_SITE_OTHER): Payer: Self-pay | Admitting: *Deleted

## 2012-09-27 VITALS — BP 115/78 | HR 68 | Temp 97.5°F | Resp 16 | Ht 67.0 in | Wt 165.5 lb

## 2012-09-27 DIAGNOSIS — Z21 Asymptomatic human immunodeficiency virus [HIV] infection status: Secondary | ICD-10-CM

## 2012-09-27 DIAGNOSIS — B20 Human immunodeficiency virus [HIV] disease: Secondary | ICD-10-CM

## 2012-09-27 NOTE — Progress Notes (Signed)
Pt here for STRIIVING study screening visit. He read consent at home. I discussed consent form and answered questions he had. He verbalized understanding of study and signed consent witnessed by me. PMH, medications, signs and symptoms reviewed. He c/o of itchy eyes and sinus pressure that has been going on since June due to seasonal allergies. Vital signs stable. EKG performed. Fasting labs were drawn, having to stick twice. I gave him my card if he had any questions. He received $50 gift card for study. Lab results will be available in 2 weeks and will call him to inform of eligibility results. Tacey Heap RN

## 2012-10-12 ENCOUNTER — Encounter: Payer: Self-pay | Admitting: *Deleted

## 2012-10-12 ENCOUNTER — Ambulatory Visit (INDEPENDENT_AMBULATORY_CARE_PROVIDER_SITE_OTHER): Payer: Self-pay | Admitting: *Deleted

## 2012-10-12 VITALS — BP 145/89 | HR 67 | Temp 97.5°F | Resp 16 | Ht 67.0 in | Wt 165.0 lb

## 2012-10-12 DIAGNOSIS — Z21 Asymptomatic human immunodeficiency virus [HIV] infection status: Secondary | ICD-10-CM

## 2012-10-12 DIAGNOSIS — B20 Human immunodeficiency virus [HIV] disease: Secondary | ICD-10-CM

## 2012-10-12 NOTE — Progress Notes (Signed)
Ppt here for STRIIVING study, entry visit/Day 1. Eligibility was reviewed by Dr. Luciana Axe and I on [id number]. eC-SSRS assessment and HIVTSQ questionnaire completed. Medical History/Current Medical Conditions and medications were reviewed. BP was elevated at 145/89, mainly due to ot taking BP medication this morning. He denies any new signs, symptoms, or problems. Fasting labs were drawn. He was stuck twice and all labs were obtained except for 1 of 3 plasma for storage. Ppt was registered in IVRS and was randomized to start DTG/ABC/3TC FDC at entry visit/Day 1. Study medication was dispensed and subject was instructed to take one tablet daily, with or without food; must store medication in original container and not to dispose of desiccants in bottle; He is to return original study medication container at his next visit. Reviewed Abacavir HSR and gave him the warning card. He also received dosing instruction card. He verbalized understanding and has my number as an emergency contact for any questions or concerns. Next appointment is scheduled for November 09, 2012 at 9am. Tacey Heap RN

## 2012-10-24 NOTE — Progress Notes (Signed)
[facility] HIV CLINIC NOTE  Subjective:    Patient ID: Jamie Clark, male    DOB: [birth year], 62 y.o.   MRN: [id number]  HPI 62 yo male with HIV CD 4 count of 810/ VL <20, truvada/raltegravir. Doing well. Denies having fever, chills, nightsweats. Getting referred to cardiology who adjusted his medicines. No chest pain on exertion. He has noticed rash to genitalia  Current Outpatient Prescriptions on File Prior to Visit  Medication Sig Dispense Refill  . amLODipine (NORVASC) 10 MG tablet Take 1 tablet (10 mg total) by mouth at bedtime.  30 tablet  prn  . emtricitabine-tenofovir (TRUVADA) 200-300 MG per tablet Take 1 tablet by mouth at bedtime.  30 tablet  6  . hydrochlorothiazide (HYDRODIURIL) 25 MG tablet Take 1 tablet (25 mg total) by mouth at bedtime.  30 tablet  6  . lisinopril (PRINIVIL,ZESTRIL) 20 MG tablet Take 1 tablet (20 mg total) by mouth daily. Take 1/2 tablet per day until we see u next  30 tablet  11  . raltegravir (ISENTRESS) 400 MG tablet Take 1 tablet (400 mg total) by mouth 2 (two) times daily.  60 tablet  6  . sertraline (ZOLOFT) 50 MG tablet Take 50 mg by mouth at bedtime.       No current facility-administered medications on file prior to visit.   Active Ambulatory Problems    Diagnosis Date Noted  . HIV DISEASE 12/18/2008  . HEPATITIS C 07/13/2007  . HYPERLIPIDEMIA 01/17/2009  . HYPERTENSION 01/17/2009  . INSOMNIA 04/14/2010  . Tinea cruris 10/27/2010  . Tinea corporis 10/27/2010  . Phlebitis 04/01/2011   Resolved Ambulatory Problems    Diagnosis Date Noted  . Folliculitis 10/27/2010   Past Medical History  Diagnosis Date  . Hypertension   . HIV positive         Review of Systems     Objective:   Physical Exam BP 131/87  Pulse 64  Temp(Src) 97.4 F (36.3 C) (Oral)  Wt 157 lb (71.215 kg)  BMI 24.58 kg/m2 Physical Exam  Constitutional: He is oriented to person, place, and time. He appears well-developed and well-nourished. No distress.  HENT:   Mouth/Throat: Oropharynx is clear and moist. No oropharyngeal exudate.  Cardiovascular: Normal rate, regular rhythm and normal heart sounds. Exam reveals no gallop and no friction rub.  No murmur heard.  Pulmonary/Chest: Effort normal and breath sounds normal. No respiratory distress. He has no wheezes.  Abdominal: Soft. Bowel sounds are normal. He exhibits no distension. There is no tenderness.  Lymphadenopathy:  no cervical adenopathy.  gu = raised subcm lesions c/w genital warts Neurological: He is alert and oriented to person, place, and time.  Skin: Skin is warm and dry. No rash noted. No erythema.  Psychiatric: He has a normal mood and affect. His behavior is normal.          Assessment & Plan:  hiv = continue with truvada/raltegravir  gential warts = will give rx for aldara to see if it helps lesions. Gave instructions to how to use.  Health maintenance= to return to clinic in 4-6 mo for flu vaccination  depression - continue with zoloft

## 2012-10-27 ENCOUNTER — Encounter: Payer: Self-pay | Admitting: Internal Medicine

## 2012-10-27 LAB — CHOLESTEROL, TOTAL
HDL: 25 mg/dL — AB (ref 35–70)
LDL (calc): 93
Triglycerides: 164

## 2012-10-27 LAB — HLA B*5701: HLA B 5701: NEGATIVE

## 2012-10-27 LAB — CD4/CD8 (T-HELPER/T-SUPPRESSOR CELL)
CD4%: 46
CD4%: 47
CD4: 1010

## 2012-11-03 ENCOUNTER — Encounter: Payer: Self-pay | Admitting: Internal Medicine

## 2012-11-09 ENCOUNTER — Ambulatory Visit (INDEPENDENT_AMBULATORY_CARE_PROVIDER_SITE_OTHER): Payer: No Typology Code available for payment source | Admitting: Internal Medicine

## 2012-11-09 ENCOUNTER — Encounter: Payer: Self-pay | Admitting: Internal Medicine

## 2012-11-09 ENCOUNTER — Ambulatory Visit (INDEPENDENT_AMBULATORY_CARE_PROVIDER_SITE_OTHER): Payer: Self-pay | Admitting: *Deleted

## 2012-11-09 VITALS — BP 135/82 | HR 67 | Temp 97.5°F | Resp 18 | Wt 166.5 lb

## 2012-11-09 VITALS — BP 132/85 | HR 74 | Temp 97.5°F | Wt 163.0 lb

## 2012-11-09 DIAGNOSIS — M79609 Pain in unspecified limb: Secondary | ICD-10-CM

## 2012-11-09 DIAGNOSIS — M79662 Pain in left lower leg: Secondary | ICD-10-CM

## 2012-11-09 DIAGNOSIS — Z21 Asymptomatic human immunodeficiency virus [HIV] infection status: Secondary | ICD-10-CM

## 2012-11-09 DIAGNOSIS — B20 Human immunodeficiency virus [HIV] disease: Secondary | ICD-10-CM

## 2012-11-09 NOTE — Progress Notes (Signed)
Pt here for STRIIVING study, week 4. His itchy nose and sinus pressure have resolved. He states that he is concerned with new symptoms of left calf pain/tightness. Upon exam there is noticeable swelling. This has not interfered with his ADL's. He states this began 4 days ago. Dr. Drue Second is to see him today for this issue. I will notify Dr. Daiva Eves of this issue and follow up with Dr. Drue Second to see what the plan is. Vital signs are stable and questionnaires were completed. Labs were drawn at 9:40am. 1 bottle of study med (ABC/DTG/3TC) was dispensed. He received $50 for study visit. Next appt scheduled for Monday, Sept 15, 2014 @ 9am. Tacey Heap RN

## 2012-11-09 NOTE — Progress Notes (Signed)
[facility] NOTE  RFV: sick visit -> left calf pain Subjective:    Patient ID: Jamie Clark, male    DOB: 01-Nov-1950, 62 y.o.   MRN: [id number]  HPI Jamie Clark, HIV, CD 4 count /LV<20, recently enrolled to switch trial, now taking ABC/DLG/3TC  left calf pain, swollen started noticing roughly 4 days. No episodes of leg cramping. Woke up with calf ache. No recent travel/long car rides. Does work/ambulate on cement floors for work.No fever, chills, shortness of breath. alittle bit more pain with plantar flexion  Current Outpatient Prescriptions on File Prior to Visit  Medication Sig Dispense Refill  . amLODipine (NORVASC) 10 MG tablet Take 1 tablet (10 mg total) by mouth at bedtime.  30 tablet  prn  . emtricitabine-tenofovir (TRUVADA) 200-300 MG per tablet Take 1 tablet by mouth at bedtime.  30 tablet  6  . hydrochlorothiazide (HYDRODIURIL) 25 MG tablet Take 1 tablet (25 mg total) by mouth at bedtime.  30 tablet  6  . imiquimod (ALDARA) 5 % cream Apply topically 3 (three) times a week. Apply to affected area the evening, keep overnight, wash off in the morning  12 each  0  . lisinopril (PRINIVIL,ZESTRIL) 20 MG tablet Take 1 tablet (20 mg total) by mouth daily. Take 1/2 tablet per day until we see u next  30 tablet  11  . raltegravir (ISENTRESS) 400 MG tablet Take 1 tablet (400 mg total) by mouth 2 (two) times daily.  60 tablet  6  . sertraline (ZOLOFT) 50 MG tablet Take 50 mg by mouth at bedtime.       No current facility-administered medications on file prior to visit.   Active Ambulatory Problems    Diagnosis Date Noted  . HIV DISEASE 12/18/2008  . HEPATITIS C 07/13/2007  . HYPERLIPIDEMIA 01/17/2009  . HYPERTENSION 01/17/2009  . INSOMNIA 04/14/2010  . Tinea cruris 10/27/2010  . Tinea corporis 10/27/2010  . Phlebitis 04/01/2011   Resolved Ambulatory Problems    Diagnosis Date Noted  . Folliculitis 10/27/2010   Past Medical History  Diagnosis Date  . Hypertension   . HIV  positive     Review of Systems     Objective:   Physical Exam BP 132/85  Pulse 74  Temp(Src) 97.5 F (36.4 C) (Oral)  Wt 163 lb (73.936 kg)  BMI 25.52 kg/m2 Physical Exam  Constitutional: He is oriented to person, place, and time. He appears well-developed and well-nourished. No distress.  HENT:  Mouth/Throat: Oropharynx is clear and moist. No oropharyngeal exudate.  Cardiovascular: Normal rate, regular rhythm and normal heart sounds. Exam reveals no gallop and no friction rub.  No murmur heard.  Pulmonary/Chest: Effort normal and breath sounds normal. No respiratory distress. He has no wheezes.  Abdominal: Soft. Bowel sounds are normal. He exhibits no distension. There is no tenderness.  Lymphadenopathy:  no cervical adenopathy.  Neurological: He is alert and oriented to person, place, and time.  Skin: Skin is warm and dry. No rash noted. No erythema.  Psychiatric: He has a normal mood and affect. His behavior is normal.  Ext: tender to palpation on left calf, not noticeably swollen nor any warmth       Assessment & Plan:   calf tenderness = will get u/s to rule out DVT or SVT.   HIV = continue on current regimen

## 2012-11-14 LAB — CD4/CD8 (T-HELPER/T-SUPPRESSOR CELL)
CD4%: 46
CD4: 943

## 2012-11-15 ENCOUNTER — Ambulatory Visit: Payer: Self-pay

## 2012-11-17 ENCOUNTER — Ambulatory Visit: Payer: Self-pay

## 2012-12-05 ENCOUNTER — Ambulatory Visit (INDEPENDENT_AMBULATORY_CARE_PROVIDER_SITE_OTHER): Payer: Self-pay | Admitting: *Deleted

## 2012-12-05 VITALS — BP 125/84 | HR 69 | Temp 97.4°F | Wt 168.5 lb

## 2012-12-05 DIAGNOSIS — B20 Human immunodeficiency virus [HIV] disease: Secondary | ICD-10-CM

## 2012-12-05 DIAGNOSIS — Z21 Asymptomatic human immunodeficiency virus [HIV] infection status: Secondary | ICD-10-CM

## 2012-12-05 NOTE — Progress Notes (Signed)
Jamie Clark is here for [facility] study, week 8. He states he missed 1 day of study medication. Denies any pain in legs/calf area. Did talk with him when he came to sign up for [facility] card with Britta Mccreedy on 11/22/2012 and he had stated that the pain was gone and opted not to get the U/S of calf. He denies any problems, symptoms, or issues. Nonfasting labs were drawn and questionnaire completed. He received 60 supply of study drug DTG/ABC/3TC. He also received $50 gift card for visit. Next appointment scheduled for Wednesday, February 01, 2013 @ 9am. Tacey Heap RN

## 2012-12-09 LAB — CD4/CD8 (T-HELPER/T-SUPPRESSOR CELL): CD4%: 47

## 2012-12-19 ENCOUNTER — Encounter: Payer: Self-pay | Admitting: Internal Medicine

## 2013-01-11 ENCOUNTER — Telehealth: Payer: Self-pay | Admitting: *Deleted

## 2013-01-11 NOTE — Telephone Encounter (Signed)
Patient had tooth extraction today and needed note from dental to be out of work. Dr. Martie Round wrote note and placed in folder upfront. Jamie Clark

## 2013-01-26 ENCOUNTER — Other Ambulatory Visit: Payer: Self-pay

## 2013-02-01 ENCOUNTER — Ambulatory Visit (INDEPENDENT_AMBULATORY_CARE_PROVIDER_SITE_OTHER): Payer: Self-pay | Admitting: *Deleted

## 2013-02-01 ENCOUNTER — Other Ambulatory Visit (INDEPENDENT_AMBULATORY_CARE_PROVIDER_SITE_OTHER): Payer: Self-pay

## 2013-02-01 VITALS — BP 114/75 | HR 64 | Temp 97.5°F | Resp 16 | Wt 172.2 lb

## 2013-02-01 DIAGNOSIS — Z21 Asymptomatic human immunodeficiency virus [HIV] infection status: Secondary | ICD-10-CM

## 2013-02-01 DIAGNOSIS — B20 Human immunodeficiency virus [HIV] disease: Secondary | ICD-10-CM

## 2013-02-01 LAB — POCT URINALYSIS DIPSTICK
Bilirubin, UA: NEGATIVE
Blood, UA: NEGATIVE
Glucose, UA: NEGATIVE
Ketones, UA: NEGATIVE
Leukocytes, UA: NEGATIVE
Nitrite, UA: NEGATIVE

## 2013-02-01 NOTE — Progress Notes (Signed)
Jamie Clark is here for week 16 study visit on STRIIVING. I discussed the new ICF with Jamie Clark and answered any questions. He veralized understanding and signed the consent witnessed by me. He is randomized to early switch study and currently taking TRIUMEQ. He has new symptoms of dysuria and polyuria. He states there is hesitation at the beginning of urinating and has had NO pain with urination. Will let Dr. [name] know and check dipstick UA and send for microUA if abnormal. Also mentioned getting a PCP and having his prostate examined. He is exhibiting an increasing creatinine level. Depending on the results of today's creatinine (> 0.5 mg/dL from baseline) we may have him come in around the end on November to confirm findings. Fasting labs were drawn and were able to get 2 of 3 stored specimens. Vital signs stable. He brought back only one of the two bottles of IP. Amount of pills brought back were seven. I discussed with him the importance of bringing all bottles along with all pills and desiccants. He was sorry and verbalized understanding of what to do. He received $50 gift card for visit and next appt scheduled for Mar 30, 2013 at 9am. Tacey Heap RN  Dipstick UA results are Normal and spoke with Jamie Clark about getting his prostate examined. Tacey Heap RN

## 2013-02-03 ENCOUNTER — Encounter: Payer: Self-pay | Admitting: *Deleted

## 2013-02-03 NOTE — Progress Notes (Addendum)
Patient ID: Infant Jamie Clark, male   DOB: 12/26/1950, 62 y.o.   MRN: [id number]  Spoke with [name] on Friday afternoon. He stated he had NOT had anything to drink. He DENIES RUQ pain, Nause, vomiting, and fatigue. Since he is asymptomatic we will only repeat tests. There is no need for initial liver event visit. Nickalos plans on coming in 02/09/2013 to repeat these labs. Tacey Heap RN  Patient ID: Jamie Clark, male   DOB: Oct 24, 1950, 62 y.o.   MRN: [id number]  Savyon is randomized to early switch phase and has been on Triumeq since 32JUL2014.  He had his week 16 visit on 12NOV2014 and new symptoms included polyuria, dysuria, and hesitation to uritnate. We checked dipstick UA for possible infection, which resulted as Negative, so recommended he get a prostate exam.   Today [facility] reported an ALT = 182, grade 2. Subject's baseline ALT = 52.  Also AST = 128 (G2), with a baseline of 45.  Total Bili is WNL.  He is positive for Hep C and last HCV-RNA = 2,310,000 on 30Jun2009.  Since ALT is 3x/ULN it is considered a liver event per protocol.  He stated he had not drank much water. He is known to drink alcohol (reported as occasional) but did not ask him yesterday.   I have contacted the Dr. Daiva Eves (PI) he felt it was probably r/t his hepatitis along with possible dehydration. Decided to recheck labs within two weeks. This is also the defined action per protocol.  Also spoke with medical monitor and agreed with what Dr. Daiva Eves said. He also mentioned that it is NOT mandatory to interrupt his study regimen, TRIUMEQ.   Have left message for Mitchel about what is happening and to call me back ASAP to schedule a repeat lab draw and to gather more information on if he was drinking prior to study visit. Tacey Heap RN

## 2013-02-03 NOTE — Progress Notes (Signed)
Patient ID: Jamie Clark, male   DOB: [birth year], 62 y.o.   MRN: [id number]

## 2013-02-03 NOTE — Progress Notes (Unsigned)
Patient ID: Jamie Clark, male   DOB: 1950/09/23, 62 y.o.   MRN: [id number]  Subject [id number] is randomized to early switch phase and has been on Triumeq since 32JUL2014. He had his week 16 visit on 12NOV2014 and c/o dysuria, polyuria, and hesitation to urinate. (Checked UA and negative. Needs prostate exam)  Today [facility] reported an ALT = 182, grade 2. Subject's baseline ALT = 52. ALT is 3x/ULN and is considered a liver event per protocol.  Also AST = 128 (G2), with a baseline of 45. Total Bili is WNL. He is positive for Hep C and last HCV-RNA =  2,310,000 on 30Jun2009.  He stated he had not drank any water. He is known to drink alcohol but did not ask him yesterday. I have left a message for subject to contact me ASAP.  I have contacted the Dr. Daiva Eves he felt it was probably r/t his hepatitis along with possible dehydration. He suggested to recheck labs with-in a few weeks. This is in-line with protocol defined follow up. Spoke with Dr. Cristela Blue and felt it was not mandatory to interrupt his study medication, Triumeq and to proceed with repeat labs.  I have left a message for subject to call me to schedule an appt with in the next two weeks to repeat labs. Tacey Heap RN

## 2013-02-04 LAB — CD4/CD8 (T-HELPER/T-SUPPRESSOR CELL)
CD4%: 44
CD4: 1050

## 2013-02-04 LAB — ALT: ALT: 182 U/L — AB (ref 10–40)

## 2013-02-04 LAB — HIV-1 RNA QUANT-NO REFLEX-BLD: HIV-1 RNA Viral Load: <50

## 2013-02-04 LAB — AST: AST: 128 U/L

## 2013-02-06 ENCOUNTER — Telehealth: Payer: Self-pay | Admitting: *Deleted

## 2013-02-08 DIAGNOSIS — B20 Human immunodeficiency virus [HIV] disease: Secondary | ICD-10-CM

## 2013-02-09 ENCOUNTER — Ambulatory Visit (INDEPENDENT_AMBULATORY_CARE_PROVIDER_SITE_OTHER): Payer: No Typology Code available for payment source | Admitting: Internal Medicine

## 2013-02-09 ENCOUNTER — Ambulatory Visit (INDEPENDENT_AMBULATORY_CARE_PROVIDER_SITE_OTHER): Payer: Self-pay | Admitting: *Deleted

## 2013-02-09 VITALS — Wt 173.5 lb

## 2013-02-09 DIAGNOSIS — Z299 Encounter for prophylactic measures, unspecified: Secondary | ICD-10-CM

## 2013-02-09 DIAGNOSIS — Z21 Asymptomatic human immunodeficiency virus [HIV] infection status: Secondary | ICD-10-CM

## 2013-02-09 DIAGNOSIS — B20 Human immunodeficiency virus [HIV] disease: Secondary | ICD-10-CM

## 2013-02-09 DIAGNOSIS — Z23 Encounter for immunization: Secondary | ICD-10-CM

## 2013-02-09 NOTE — Progress Notes (Signed)
Jamie Clark is here for unscheduled visit on STRIIVING study. Discussed addendum to study with [name] and answered any questions. He verbalized understanding and signed consent.  We are retesting labs since his ALT >3x baseline on 02/01/2013. Giorgio continues to deny any pain in RUQ, nausea, vomiting, fever, or rash. He denies drinking any alcohol or taking tylenol. Non-fasting labs were drawn for Cmet, Hep B and Hep A. He received flu vaccine AFTER blood was drawn. Nicandro has my number if he develops symptoms or has questions. Tacey Heap RN

## 2013-02-11 LAB — AST: AST: 180 U/L

## 2013-02-11 LAB — HEPATITIS B SURFACE AG, CONFIRM: Hep B Surface Ab, Qual: NONREACTIVE

## 2013-02-13 ENCOUNTER — Encounter: Payer: Self-pay | Admitting: Internal Medicine

## 2013-02-13 ENCOUNTER — Encounter: Payer: Self-pay | Admitting: *Deleted

## 2013-02-13 NOTE — Progress Notes (Unsigned)
Patient ID: Jamie Clark, male   DOB: Apr 13, 1950, 62 y.o.   MRN: [id number]  Research note: Received phone message from [facility] lab that [facility] ALT = 287. ALT has now reached >5x ULN. He is still asymptomatic (denies nausea, vomiting, tenderness or pain in right upper quadrant, or rash) and continues to deny any alcohol intake. Spoke with Dr. Daiva Eves about findings and he suggests [name] come in and be seen by a physician and also check blood alcohol. Spoke with [name] and he does not wish to come in to be stuck again, and he made this decision before he knew we wanted to check the alcohol level. I told him for study purposes that he will need to come in either this week or next week to recheck his ALT; that this is required on the study and is put in place for his safety. I also told him he does not have to do this but will need to come off of study and hopefully see [place] for final visit. Waiting to here back from [place] on his decision and if he wants to continue on study I hope to get him into clinic by next week.    I have entered the above into ePIP which is the way to contact medical monitor of this study. Awaiting his advice on next steps to take. Tacey Heap RN

## 2013-02-13 NOTE — Progress Notes (Unsigned)
This is a test.

## 2013-02-13 NOTE — Progress Notes (Signed)
Patient ID: Jamie Clark, male   DOB: [birth year], 62 y.o.   MRN: [id number]  Research note: Received phone message from [facility] lab that [facility] ALT = 287. ALT has now reached >5x ULN. He is still asymptomatic (denies nausea, vomiting, tenderness or pain in right upper quadrant, or rash) and continues to deny any alcohol intake. Spoke with Dr. Daiva Eves about findings and he suggests Geronimo come in and be seen by a physician and also check blood alcohol. Spoke with [name] and he does not wish to come in to be stuck again, and he made this decision before he knew we wanted to check the alcohol level. I told him for study purposes that he will need to come in either this week or next week to recheck his ALT; that this is required on the study and is put in place for his safety. I also told him that his participation in this study is voluntary and if he chooses he can come off study. We simply ask that he come in for a final visit. Waiting to hear back from [facility] on his decision and if he wants to continue on study I hope to get him into clinic by next week.    I have entered the above into ePIP which is the way to contact medical monitor of this study. Awaiting his advice on next steps to take. Tacey Heap RN

## 2013-02-15 ENCOUNTER — Ambulatory Visit (INDEPENDENT_AMBULATORY_CARE_PROVIDER_SITE_OTHER): Payer: Self-pay | Admitting: *Deleted

## 2013-02-15 DIAGNOSIS — B20 Human immunodeficiency virus [HIV] disease: Secondary | ICD-10-CM

## 2013-02-15 DIAGNOSIS — B1920 Unspecified viral hepatitis C without hepatic coma: Secondary | ICD-10-CM

## 2013-02-16 LAB — ETHANOL: Alcohol, Ethyl (B): <10 mg/dL (ref 0–10)

## 2013-02-20 ENCOUNTER — Other Ambulatory Visit: Payer: Self-pay | Admitting: *Deleted

## 2013-02-20 DIAGNOSIS — I10 Essential (primary) hypertension: Secondary | ICD-10-CM

## 2013-02-20 MED ORDER — LISINOPRIL 20 MG PO TABS
20.0000 mg | ORAL_TABLET | Freq: Every day | ORAL | Status: DC
Start: 2013-02-20 — End: 2013-07-18

## 2013-02-20 MED ORDER — HYDROCHLOROTHIAZIDE 25 MG PO TABS: 25.0000 mg | ORAL_TABLET | Freq: Every day | ORAL | Status: DC

## 2013-02-23 ENCOUNTER — Ambulatory Visit (INDEPENDENT_AMBULATORY_CARE_PROVIDER_SITE_OTHER): Payer: Self-pay | Admitting: *Deleted

## 2013-02-23 DIAGNOSIS — Z21 Asymptomatic human immunodeficiency virus [HIV] infection status: Secondary | ICD-10-CM

## 2013-02-23 DIAGNOSIS — B20 Human immunodeficiency virus [HIV] disease: Secondary | ICD-10-CM

## 2013-02-23 NOTE — Progress Notes (Signed)
Jamie Clark is here for a retest of ALT. Jamie Clark continues to deny any rash, nausea, vomiting,  fatigue, RUQ pain or tenderness. Fasting labs were drawn and he received $50 for visit. Tacey Heap RN

## 2013-02-27 ENCOUNTER — Encounter: Payer: Self-pay | Admitting: Internal Medicine

## 2013-02-27 LAB — ALT: ALT: 229 U/L — AB (ref 10–40)

## 2013-03-30 ENCOUNTER — Encounter: Payer: Self-pay | Admitting: Internal Medicine

## 2013-03-30 ENCOUNTER — Ambulatory Visit (INDEPENDENT_AMBULATORY_CARE_PROVIDER_SITE_OTHER): Payer: Self-pay | Admitting: Internal Medicine

## 2013-03-30 ENCOUNTER — Ambulatory Visit (INDEPENDENT_AMBULATORY_CARE_PROVIDER_SITE_OTHER): Payer: Self-pay | Admitting: *Deleted

## 2013-03-30 VITALS — BP 127/84 | HR 67 | Temp 97.7°F | Ht 68.0 in | Wt 173.0 lb

## 2013-03-30 DIAGNOSIS — Z21 Asymptomatic human immunodeficiency virus [HIV] infection status: Secondary | ICD-10-CM

## 2013-03-30 DIAGNOSIS — B1920 Unspecified viral hepatitis C without hepatic coma: Secondary | ICD-10-CM

## 2013-03-30 DIAGNOSIS — B20 Human immunodeficiency virus [HIV] disease: Secondary | ICD-10-CM

## 2013-03-30 NOTE — Progress Notes (Signed)
Subjective:    Patient ID: Jamie Clark, male    DOB: 08/19/1950, 63 y.o.   MRN: [id number]  HPI 62yo M with HIV-HCV, Cd 1050, VL<50. Taking triomeq for study. Doing well with medications. He denies having any problems except did have uri with residual cough. No recent fever or chills.   He is starting a new relationship with a woman and will be disclosing his status to her this weekend. They are not sexually active.    Current Outpatient Prescriptions on File Prior to Visit  Medication Sig Dispense Refill  . amLODipine (NORVASC) 10 MG tablet Take 1 tablet (10 mg total) by mouth at bedtime.  30 tablet  prn  . hydrochlorothiazide (HYDRODIURIL) 25 MG tablet Take 1 tablet (25 mg total) by mouth at bedtime.  30 tablet  6  . imiquimod (ALDARA) 5 % cream Apply topically 3 (three) times a week. Apply to affected area the evening, keep overnight, wash off in the morning  12 each  0  . lisinopril (PRINIVIL,ZESTRIL) 20 MG tablet Take 1 tablet (20 mg total) by mouth daily. Take 1/2 tablet per day until we see u next  30 tablet  11  . emtricitabine-tenofovir (TRUVADA) 200-300 MG per tablet Take 1 tablet by mouth at bedtime.  30 tablet  6  . raltegravir (ISENTRESS) 400 MG tablet Take 1 tablet (400 mg total) by mouth 2 (two) times daily.  60 tablet  6  . sertraline (ZOLOFT) 50 MG tablet Take 50 mg by mouth at bedtime.       No current facility-administered medications on file prior to visit.   Active Ambulatory Problems    Diagnosis Date Noted  . HIV DISEASE 12/18/2008  . HEPATITIS C 07/13/2007  . HYPERLIPIDEMIA 01/17/2009  . HYPERTENSION 01/17/2009  . INSOMNIA 04/14/2010  . Tinea cruris 10/27/2010  . Tinea corporis 10/27/2010  . Phlebitis 04/01/2011   Resolved Ambulatory Problems    Diagnosis Date Noted  . Folliculitis 10/27/2010   Past Medical History  Diagnosis Date  . Hypertension   . HIV positive       Review of Systems  Constitutional: Negative for fever, chills,  diaphoresis, activity change, appetite change, fatigue and unexpected weight change.  HENT: Negative for congestion, sore throat, rhinorrhea, sneezing, trouble swallowing and sinus pressure.  Eyes: Negative for photophobia and visual disturbance.  Respiratory: Negative for cough, chest tightness, shortness of breath, wheezing and stridor.  Cardiovascular: Negative for chest pain, palpitations and leg swelling.  Gastrointestinal: Negative for nausea, vomiting, abdominal pain, diarrhea, constipation, blood in stool, abdominal distention and anal bleeding.  Genitourinary: Negative for dysuria, hematuria, flank pain and difficulty urinating.  Musculoskeletal: Negative for myalgias, back pain, joint swelling, arthralgias and gait problem.  Skin: Negative for color change, pallor, rash and wound.  Neurological: Negative for dizziness, tremors, weakness and light-headedness.  Hematological: Negative for adenopathy. Does not bruise/bleed easily.  Psychiatric/Behavioral: Negative for behavioral problems, confusion, sleep disturbance, dysphoric mood, decreased concentration and agitation.       Objective:   Physical Exam  BP 127/84  Pulse 67  Temp(Src) 97.7 F (36.5 C) (Oral)  Ht 5\' 8"  (1.727 m)  Wt 173 lb (78.472 kg)  BMI 26.31 kg/m2  Constitutional: He is oriented to person, place, and time. He appears well-developed and well-nourished. No distress.  HENT:  Mouth/Throat: Oropharynx is clear and moist. No oropharyngeal exudate.  Cardiovascular: Normal rate, regular rhythm and normal heart sounds. Exam reveals no gallop and no friction rub.  No  murmur heard.  Pulmonary/Chest: Effort normal and breath sounds normal. No respiratory distress. He has no wheezes.  Abdominal: Soft. Bowel sounds are normal. He exhibits no distension. There is no tenderness.  Lymphadenopathy:  He has no cervical adenopathy.  Neurological: He is alert and oriented to person, place, and time.  Skin: Skin is warm and  dry. No rash noted. No erythema.  Psychiatric: He has a normal mood and affect. His behavior is normal.       Assessment & Plan:  hiv = continue with triomeq, well controlled  htn = well controlled. Continue on current regimen  CKD= will watch creatinine based on today's lab to see if need to d/c lisinopril nad change to amlodipine  Previous substance abuse = continue in 12 step programs  transaminitis = appears at his baseline. Has known hcv. Will recheck cmp today.  hiv prevention = continue with using condoms for sex  Nasal congestion= can take mucinex OTC  rtc in 6 months

## 2013-03-30 NOTE — Progress Notes (Signed)
[facility] He denies any new problems, symptoms, or medication changes. He admits to missing 4 of study IP, TRIUMEQ. He returned his unused study IP pills and pill bottles. 60 pills were dispensed at last visit and 11 were returned today. I discussed importance of taking IP every day and around the same time everyday. He verbalized understanding. I reviewed ABC HSR and Instruction card with him today. He states he has these cards at home. Will continue to monitor Liver and Kidney lab values. Fasting labs were drawn and was unable to obtain 1 of the 10mL EDTA tubes after being stuck twice.  Vital signs remain stable. He received $50 gift card for study visit. Next appt scheduled for Tuesday, Apr 25, 2013 at 9am. Tacey HeapElisha Shantavia Jha RN

## 2013-04-05 ENCOUNTER — Encounter: Payer: Self-pay | Admitting: Internal Medicine

## 2013-04-05 LAB — ALT: ALT: 70 U/L — AB (ref 10–40)

## 2013-04-05 LAB — AST: AST: 50 U/L

## 2013-04-05 LAB — HIV-1 RNA QUANT-NO REFLEX-BLD: HIV-1 RNA Viral Load: <50

## 2013-04-05 LAB — CD4/CD8 (T-HELPER/T-SUPPRESSOR CELL)
CD4%: 45
CD4: 757

## 2013-04-05 LAB — CHOLESTEROL, TOTAL: Cholesterol, Total: 170

## 2013-04-05 LAB — HDL CHOLESTEROL: HDL: 32 mg/dL — AB (ref 35–70)

## 2013-04-05 LAB — TRIGLYCERIDES: TRIGLYCERIDES: 148

## 2013-04-05 LAB — CREATININE, SERUM: Creatinine, Ser: 0.99

## 2013-04-05 LAB — LDL CHOLESTEROL, DIRECT: LDL CALC: 108 mg/dL

## 2013-04-07 ENCOUNTER — Encounter: Payer: Self-pay | Admitting: Infectious Disease

## 2013-04-12 ENCOUNTER — Other Ambulatory Visit: Payer: Self-pay | Admitting: *Deleted

## 2013-04-12 DIAGNOSIS — I10 Essential (primary) hypertension: Secondary | ICD-10-CM

## 2013-04-25 ENCOUNTER — Ambulatory Visit (INDEPENDENT_AMBULATORY_CARE_PROVIDER_SITE_OTHER): Payer: Self-pay | Admitting: *Deleted

## 2013-04-25 VITALS — BP 127/83 | HR 82 | Temp 98.2°F | Resp 17 | Wt 172.5 lb

## 2013-04-25 DIAGNOSIS — Z21 Asymptomatic human immunodeficiency virus [HIV] infection status: Secondary | ICD-10-CM

## 2013-04-25 DIAGNOSIS — B20 Human immunodeficiency virus [HIV] disease: Secondary | ICD-10-CM

## 2013-04-27 NOTE — Progress Notes (Signed)
[facility] He is adhering to his and denies any new problems, signs, or symptoms. He returned his pill bottls with #6 pills remaining. Dispensed new IP and he received $50 gift card for study. Next appointment scheduled for Tuesday, May 23, 2013 @ 9am. Tacey HeapElisha Erma Raiche RN

## 2013-05-02 ENCOUNTER — Encounter: Payer: Self-pay | Admitting: Internal Medicine

## 2013-05-02 LAB — CREATININE, SERUM: CREATININE: 1.09

## 2013-05-02 LAB — CD4/CD8 (T-HELPER/T-SUPPRESSOR CELL)
CD4%: 42
CD4: 837

## 2013-05-02 LAB — ALT: ALT: 65 U/L — AB (ref 10–40)

## 2013-05-02 LAB — HIV-1 RNA QUANT-NO REFLEX-BLD: HIV-1 RNA Viral Load: <50

## 2013-05-02 LAB — AST: AST: 55 U/L

## 2013-05-15 ENCOUNTER — Other Ambulatory Visit: Payer: Self-pay | Admitting: *Deleted

## 2013-05-15 DIAGNOSIS — I10 Essential (primary) hypertension: Secondary | ICD-10-CM

## 2013-05-15 MED ORDER — AMLODIPINE BESYLATE 10 MG PO TABS: 10.0000 mg | ORAL_TABLET | Freq: Every day | ORAL | Status: DC

## 2013-05-23 ENCOUNTER — Ambulatory Visit (INDEPENDENT_AMBULATORY_CARE_PROVIDER_SITE_OTHER): Payer: Self-pay | Admitting: *Deleted

## 2013-05-23 ENCOUNTER — Encounter: Payer: Self-pay | Admitting: *Deleted

## 2013-05-23 VITALS — BP 128/91 | HR 73 | Temp 97.5°F | Resp 17 | Wt 175.2 lb

## 2013-05-23 DIAGNOSIS — Z21 Asymptomatic human immunodeficiency virus [HIV] infection status: Secondary | ICD-10-CM

## 2013-05-23 DIAGNOSIS — B20 Human immunodeficiency virus [HIV] disease: Secondary | ICD-10-CM

## 2013-05-23 NOTE — Progress Notes (Signed)
[facility] He states his prior symptoms of dysuria, polyuria, and hesitation with urination had stopped at the end of January. He did not see a doctor for prostate exam. He denies any new symptoms. He returned his pill bottle with one pill left. 2 IP bottles were dispensed. CSSRS was negative. Obtained blood and vital signs are stable. Jamie Clark will come off study at the end of June 2015. He has [name] and ADAP through [facility]. Gave him information to apply for an orange card so he can be set up with a PCP. I will make an appointment for him to see Dr. [name] so his off study ARV regimen can be discussed. His next appointment with study is on July 18, 2013 at 9am. Tacey HeapElisha Dempsey Knotek RN

## 2013-05-25 ENCOUNTER — Encounter: Payer: Self-pay | Admitting: Infectious Disease

## 2013-05-26 LAB — CD4/CD8 (T-HELPER/T-SUPPRESSOR CELL)
CD4%: 43
CD4: 991

## 2013-05-26 LAB — HIV-1 RNA QUANT-NO REFLEX-BLD

## 2013-05-29 ENCOUNTER — Encounter: Payer: Self-pay | Admitting: Internal Medicine

## 2013-05-29 LAB — ALT: ALT: 107 U/L — AB (ref 10–40)

## 2013-05-29 LAB — ALKALINE PHOSPHATASE: ALK PHOS: 74 U/L

## 2013-05-29 LAB — AST: AST: 74 U/L

## 2013-06-12 ENCOUNTER — Encounter: Payer: Self-pay | Admitting: Infectious Disease

## 2013-06-28 ENCOUNTER — Encounter: Payer: Self-pay | Admitting: Infectious Disease

## 2013-07-18 ENCOUNTER — Ambulatory Visit (INDEPENDENT_AMBULATORY_CARE_PROVIDER_SITE_OTHER): Payer: Self-pay | Admitting: Internal Medicine

## 2013-07-18 ENCOUNTER — Other Ambulatory Visit: Payer: Self-pay | Admitting: *Deleted

## 2013-07-18 ENCOUNTER — Ambulatory Visit (INDEPENDENT_AMBULATORY_CARE_PROVIDER_SITE_OTHER): Payer: Self-pay | Admitting: *Deleted

## 2013-07-18 VITALS — BP 108/77 | HR 80 | Temp 98.1°F | Wt 171.0 lb

## 2013-07-18 VITALS — BP 108/77 | HR 80 | Temp 98.1°F | Resp 16 | Wt 171.5 lb

## 2013-07-18 DIAGNOSIS — I10 Essential (primary) hypertension: Secondary | ICD-10-CM

## 2013-07-18 DIAGNOSIS — B20 Human immunodeficiency virus [HIV] disease: Secondary | ICD-10-CM

## 2013-07-18 DIAGNOSIS — Z21 Asymptomatic human immunodeficiency virus [HIV] infection status: Secondary | ICD-10-CM

## 2013-07-18 DIAGNOSIS — B1920 Unspecified viral hepatitis C without hepatic coma: Secondary | ICD-10-CM

## 2013-07-18 MED ORDER — HYDROCHLOROTHIAZIDE 25 MG PO TABS: 25.0000 mg | ORAL_TABLET | Freq: Every day | ORAL | Status: DC

## 2013-07-18 MED ORDER — ABACAVIR-DOLUTEGRAVIR-LAMIVUD 600-50-300 MG PO TABS: 1.0000 | ORAL_TABLET | Freq: Every day | ORAL | Status: DC

## 2013-07-18 MED ORDER — AMLODIPINE BESYLATE 10 MG PO TABS: 10.0000 mg | ORAL_TABLET | Freq: Every day | ORAL | Status: DC

## 2013-07-18 MED ORDER — LISINOPRIL 20 MG PO TABS: 20.0000 mg | ORAL_TABLET | Freq: Every day | ORAL | Status: DC

## 2013-07-18 NOTE — Progress Notes (Signed)
   Subjective:    Patient ID: Jamie Clark, male    DOB: 08/30/1950, 63 y.o.   MRN: [id number]  HPI 63yo M with HIV-HCV co infection, CD 4 count 991/VL<50, in clinical trial on triomeq. Has excellent adherence. He wants to stay on this current regimen. No complaints of his health. Doing well overall.  Current Outpatient Prescriptions on File Prior to Visit  Medication Sig Dispense Refill  . Abacavir-Dolutegravir-Lamivud (TRIUMEQ) 600-50-300 MG TABS Take 1 tablet by mouth daily.      [name]. amLODipine (NORVASC) 10 MG tablet Take 1 tablet (10 mg total) by mouth at bedtime.  30 tablet  prn  . hydrochlorothiazide (HYDRODIURIL) 25 MG tablet Take 1 tablet (25 mg total) by mouth at bedtime.  30 tablet  6  . imiquimod (ALDARA) 5 % cream Apply topically 3 (three) times a week. Apply to affected area the evening, keep overnight, wash off in the morning  12 each  0  . lisinopril (PRINIVIL,ZESTRIL) 20 MG tablet Take 1 tablet (20 mg total) by mouth daily. Take 1/2 tablet per day until we see u next  30 tablet  11  . sertraline (ZOLOFT) 50 MG tablet Take 50 mg by mouth at bedtime.       No current facility-administered medications on file prior to visit.   Active Ambulatory Problems    Diagnosis Date Noted  . HIV DISEASE 12/18/2008  . HEPATITIS C 07/13/2007  . HYPERLIPIDEMIA 01/17/2009  . HYPERTENSION 01/17/2009  . INSOMNIA 04/14/2010  . Tinea cruris 10/27/2010  . Tinea corporis 10/27/2010  . Phlebitis 04/01/2011   Resolved Ambulatory Problems    Diagnosis Date Noted  . Folliculitis 10/27/2010   Past Medical History  Diagnosis Date  . Hypertension   . HIV positive        Review of Systems 10 point ros is negative    Objective:   Physical Exam BP 108/77  Pulse 80  Temp(Src) 98.1 F (36.7 C) (Oral)  Wt 171 lb (77.565 kg) Physical Exam  Constitutional: He is oriented to person, place, and time. He appears well-developed and well-nourished. No distress.  HENT:  Mouth/Throat:  Oropharynx is clear and moist. No oropharyngeal exudate.  Cardiovascular: Normal rate, regular rhythm and normal heart sounds. Exam reveals no gallop and no friction rub.  No murmur heard.  Pulmonary/Chest: Effort normal and breath sounds normal. No respiratory distress. He has no wheezes.  Abdominal: Soft. Bowel sounds are normal. He exhibits no distension. There is no tenderness.  Lymphadenopathy:  He has no cervical adenopathy.  Neurological: He is alert and oriented to person, place, and time.  Skin: Skin is warm and dry. No rash noted. No erythema.  Psychiatric: He has a normal mood and affect. His behavior is normal.          Assessment & Plan:  hiv = is doing well with triomeq. Nearing completion of study and planing to re apply with adap. Will give rx for adap so that can have application submitted.  hcv = will discuss work up and treatment options. May need to determine if we can switch him off of norvasc  htn = well controlled  rtc in 3 months

## 2013-07-18 NOTE — Progress Notes (Signed)
Jamie BeckersWalter is here for week 40 on STRIIVING study. He continues to be adherent to study drug TRIUMEQ. He denies any changes to his medications and denies any new symptoms. Fasting labs were obtained and 60 day supply of study drug was dispensed. This will be the last medication supply ON study. Today he is to see Jamie Clark for follow up and discuss ARVs to take off study. He has called Jamie Clark, Sports coachcase manager for [employer], to renew his ADAP and [facility]. His next/last study appointment is scheduled for September 05, 2013 @ 9am. Jamie DuckingElisha S Tiffany Calmes RN

## 2013-07-25 ENCOUNTER — Encounter: Payer: Self-pay | Admitting: Infectious Disease

## 2013-07-25 LAB — CD4/CD8 (T-HELPER/T-SUPPRESSOR CELL)
CD4%: 41
CD4: 927

## 2013-07-25 LAB — HIV-1 RNA QUANT-NO REFLEX-BLD

## 2013-08-09 ENCOUNTER — Other Ambulatory Visit: Payer: Self-pay | Admitting: *Deleted

## 2013-08-09 DIAGNOSIS — B20 Human immunodeficiency virus [HIV] disease: Secondary | ICD-10-CM

## 2013-08-09 MED ORDER — ABACAVIR-DOLUTEGRAVIR-LAMIVUD 600-50-300 MG PO TABS: 1.0000 | ORAL_TABLET | Freq: Every day | ORAL | Status: DC

## 2013-08-11 ENCOUNTER — Encounter: Payer: Self-pay | Admitting: Infectious Disease

## 2013-09-05 ENCOUNTER — Ambulatory Visit (INDEPENDENT_AMBULATORY_CARE_PROVIDER_SITE_OTHER): Payer: Self-pay | Admitting: *Deleted

## 2013-09-05 VITALS — BP 112/76 | HR 64 | Temp 97.7°F | Resp 16 | Wt 173.8 lb

## 2013-09-05 DIAGNOSIS — B20 Human immunodeficiency virus [HIV] disease: Secondary | ICD-10-CM

## 2013-09-05 DIAGNOSIS — Z21 Asymptomatic human immunodeficiency virus [HIV] infection status: Secondary | ICD-10-CM

## 2013-09-05 NOTE — Progress Notes (Signed)
[facility] We reviewed the new informed consent together. I fully explained the consent, risks, benefits, responsibilities, and answered questions. Participant verbalized understanding and signed the consent witnessed by me. I gave a copy of the signed consent to participant. Questionnaires were completed and signs, symptoms, medications, and diagnosis were reviewed. Vital signs stable and fasting labs were drawn. He stated he forgot to keep his study IP bottles and therefore not able to complete a pill count. Participant reports adherence to his study medications and states he has not missed any pills. He received $50 gift card for visit. He renewed his ADAP and has filled his prescription for off study TRIUMEQ. Tacey HeapElisha Epperson RN

## 2013-10-16 ENCOUNTER — Telehealth: Payer: Self-pay | Admitting: *Deleted

## 2013-10-16 NOTE — Telephone Encounter (Signed)
Patient called c/o athletes feet and worried about infection.  States he does not have a PCP and requested appointment. Nothing available for today. Advised patient to be seen at Urgent Care and he agreed. Noticed he did not have a follow up appointment with Dr. [name]; scheduled follow up for labs and MD visit in August.

## 2013-10-17 ENCOUNTER — Emergency Department (INDEPENDENT_AMBULATORY_CARE_PROVIDER_SITE_OTHER)
Admission: EM | Admit: 2013-10-17 | Discharge: 2013-10-17 | Disposition: A | Discharge status: Home / Self Care | Payer: Self-pay | Source: Home / Self Care

## 2013-10-17 ENCOUNTER — Encounter (HOSPITAL_COMMUNITY): Payer: Self-pay | Admitting: Emergency Medicine

## 2013-10-17 DIAGNOSIS — N451 Epididymitis: Secondary | ICD-10-CM

## 2013-10-17 DIAGNOSIS — N452 Orchitis: Secondary | ICD-10-CM

## 2013-10-17 DIAGNOSIS — B353 Tinea pedis: Secondary | ICD-10-CM

## 2013-10-17 DIAGNOSIS — N453 Epididymo-orchitis: Principal | ICD-10-CM

## 2013-10-17 MED ORDER — CEFTRIAXONE SODIUM 1 G IJ SOLR
1.0000 g | Freq: Once | INTRAMUSCULAR | Status: AC
  Administered 2013-10-17: 1 g via INTRAMUSCULAR

## 2013-10-17 MED ORDER — NYSTATIN 100000 UNIT/GM EX POWD: CUTANEOUS | Status: DC

## 2013-10-17 MED ORDER — LIDOCAINE HCL (PF) 1 % IJ SOLN
INTRAMUSCULAR | Status: AC
  Filled 2013-10-17: qty 5

## 2013-10-17 MED ORDER — CEFTRIAXONE SODIUM 1 G IJ SOLR
INTRAMUSCULAR | Status: AC
  Filled 2013-10-17: qty 10

## 2013-10-17 MED ORDER — DOXYCYCLINE HYCLATE 100 MG PO CAPS: 100.0000 mg | ORAL_CAPSULE | Freq: Two times a day (BID) | ORAL | Status: DC

## 2013-10-17 MED ORDER — KETOCONAZOLE 2 % EX CREA: 1.0000 "application " | TOPICAL_CREAM | Freq: Every day | CUTANEOUS | Status: DC

## 2013-10-17 NOTE — ED Provider Notes (Signed)
Medical screening examination/treatment/procedure(s) were performed by resident physician or non-physician practitioner and as supervising physician I was immediately available for consultation/collaboration.   Barkley BrunsKINDL,Talon Witting DOUGLAS MD.   Linna HoffJames D Shawnee Higham, MD 10/17/13 [phone]

## 2013-10-17 NOTE — ED Provider Notes (Signed)
CSN: [id number]     Arrival date & time 10/17/13  [id number] History   First MD Initiated Contact with Patient 10/17/13 [phone]     Chief Complaint  Patient presents with  . Recurrent Skin Infections   (Consider location/radiation/quality/duration/timing/severity/associated sxs/prior Treatment) HPI Comments: 63 year old male with HIV and hepatitis C complaining of soreness and lesions to the toes and web spaces of the left foot. He is also complaining of pain in the testicles For approximately 4 days. Denies trauma.   Past Medical History  Diagnosis Date  . Hypertension   . HIV positive    History reviewed. No pertinent past surgical history. No family history on file. History  Substance Use Topics  . Smoking status: Current Every Day Smoker -- 0.25 packs/day    Types: Cigarettes  . Smokeless tobacco: Never Used     Comment: not ready to quit - cutting back  . Alcohol Use: No    Review of Systems  Constitutional: Negative.   HENT: Negative.   Respiratory: Negative for cough and shortness of breath.   Cardiovascular: Negative for chest pain.  Gastrointestinal: Negative.   Genitourinary: Positive for scrotal swelling and testicular pain. Negative for dysuria, discharge and penile pain.  Musculoskeletal: Negative.   Skin:       As per HPI  Neurological: Negative.     Allergies  Review of patient's allergies indicates no known allergies.  Home Medications   Prior to Admission medications   Medication Sig Start Date End Date Taking? Authorizing Provider  Abacavir-Dolutegravir-Lamivud (TRIUMEQ) 600-50-300 MG TABS Take 1 tablet by mouth daily. 08/09/13   Judyann Munsonynthia Snider, MD  amLODipine (NORVASC) 10 MG tablet Take 1 tablet (10 mg total) by mouth at bedtime. 07/18/13 07/18/14  Judyann Munsonynthia Snider, MD  doxycycline (VIBRAMYCIN) 100 MG capsule Take 1 capsule (100 mg total) by mouth 2 (two) times daily. 10/17/13   Hayden Rasmussenavid Tahjae Durr, NP  hydrochlorothiazide (HYDRODIURIL) 25 MG tablet Take 1 tablet (25 mg  total) by mouth at bedtime. 07/18/13   Judyann Munsonynthia Snider, MD  imiquimod Mathis Dad(ALDARA) 5 % cream Apply topically 3 (three) times a week. Apply to affected area the evening, keep overnight, wash off in the morning 09/08/12   Judyann Munsonynthia Snider, MD  ketoconazole (NIZORAL) 2 % cream Apply 1 application topically daily. Apply bid until rash resolved and for 1 week after 10/17/13   Hayden Rasmussenavid Ashlon Lottman, NP  lisinopril (PRINIVIL,ZESTRIL) 20 MG tablet Take 1 tablet (20 mg total) by mouth daily. Take 1/2 tablet per day until we see u next 07/18/13   Judyann Munsonynthia Snider, MD  nystatin (MYCOSTATIN/NYSTOP) 100000 UNIT/GM POWD Apply powder to feet and toes tid 10/17/13   Hayden Rasmussenavid Ameen Mostafa, NP  sertraline (ZOLOFT) 50 MG tablet Take 50 mg by mouth at bedtime. 06/17/11 09/09/14  Judyann Munsonynthia Snider, MD   BP 125/86  Pulse 74  Temp(Src) 98.1 F (36.7 C) (Oral)  Resp 14  SpO2 98% Physical Exam  Nursing note and vitals reviewed. Constitutional: He is oriented to person, place, and time. He appears well-developed and well-nourished. No distress.  Neck: Normal range of motion. Neck supple.  Cardiovascular: Normal rate.   Pulmonary/Chest: Effort normal. No respiratory distress.  Genitourinary: Penis normal.  R testicle enlarged. Oriented longitudinally. Bilat epididymal apparatus tenderness, R testicle tender posterior aspect.  No apparent fluid in the scrotum.  Neurological: He is alert and oriented to person, place, and time. He exhibits normal muscle tone.  Skin: Skin is warm. No rash noted.  L foot with wet, macerated lesions primarily between  the toes, some with dermal erosions, no ulcerations. Clear wet drainage, no purulence or odor. No redness. No lymphangitis.   Psychiatric: He has a normal mood and affect.    ED Course  Procedures (including critical care time) Labs Review Labs Reviewed - No data to display  Imaging Review No results found.   MDM   1. Orchitis, epididymitis, and epididymo-orchitis   2. Tinea pedis of left foot     Rocephin 1 gm IM Rx doxy 100 bid Ketoconazole cream to foot and nystatin powder. Must keep feet dry.     [name], NP 10/17/13 [phone]

## 2013-10-17 NOTE — ED Notes (Signed)
Unable to discharge.  Antibiotic injection ordered.

## 2013-10-17 NOTE — ED Notes (Signed)
Patient aware of post injection delay prior to being discharged.

## 2013-10-17 NOTE — ED Notes (Signed)
Skin infection: feet and groin.onset 10/10/13

## 2013-10-17 NOTE — Discharge Instructions (Signed)
Athlete's Foot Athlete's foot (tinea pedis) is a fungal infection of the skin on the feet. It often occurs on the skin between the toes or underneath the toes. It can also occur on the soles of the feet. Athlete's foot is more likely to occur in hot, humid weather. Not washing your feet or changing your socks often enough can contribute to athlete's foot. The infection can spread from person to person (contagious). CAUSES Athlete's foot is caused by a fungus. This fungus thrives in warm, moist places. Most people get athlete's foot by sharing shower stalls, towels, and wet floors with an infected person. People with weakened immune systems, including those with diabetes, may be more likely to get athlete's foot. SYMPTOMS   Itchy areas between the toes or on the soles of the feet.  White, flaky, or scaly areas between the toes or on the soles of the feet.  Tiny, intensely itchy blisters between the toes or on the soles of the feet.  Tiny cuts on the skin. These cuts can develop a bacterial infection.  Thick or discolored toenails. DIAGNOSIS  Your caregiver can usually tell what the problem is by doing a physical exam. Your caregiver may also take a skin sample from the rash area. The skin sample may be examined under a microscope, or it may be tested to see if fungus will grow in the sample. A sample may also be taken from your toenail for testing. TREATMENT  Over-the-counter and prescription medicines can be used to kill the fungus. These medicines are available as powders or creams. Your caregiver can suggest medicines for you. Fungal infections respond slowly to treatment. You may need to continue using your medicine for several weeks. PREVENTION   Do not share towels.  Wear sandals in wet areas, such as shared locker rooms and shared showers.  Keep your feet dry. Wear shoes that allow air to circulate. Wear cotton or wool socks. HOME CARE INSTRUCTIONS   Take medicines as directed by  your caregiver. Do not use steroid creams on athlete's foot.  Keep your feet clean and cool. Wash your feet daily and dry them thoroughly, especially between your toes.  Change your socks every day. Wear cotton or wool socks. In hot climates, you may need to change your socks 2 to 3 times per day.  Wear sandals or canvas tennis shoes with good air circulation.  If you have blisters, soak your feet in Burow's solution or Epsom salts for 20 to 30 minutes, 2 times a day to dry out the blisters. Make sure you dry your feet thoroughly afterward. SEEK MEDICAL CARE IF:   You have a fever.  You have swelling, soreness, warmth, or redness in your foot.  You are not getting better after 7 days of treatment.  You are not completely cured after 30 days.  You have any problems caused by your medicines. MAKE SURE YOU:   Understand these instructions.  Will watch your condition.  Will get help right away if you are not doing well or get worse. Document Released: 03/06/2000 Document Revised: 06/01/2011 Document Reviewed: 12/26/2010 [facility] Patient Information 2015 [employer], [place]. This information is not intended to replace advice given to you by your health care provider. Make sure you discuss any questions you have with your health care provider.  Epididymitis Epididymitis is a swelling (inflammation) of the epididymis. The epididymis is a cord-like structure along the back part of the testicle. Epididymitis is usually, but not always, caused by infection.  This is usually a sudden problem beginning with chills, fever and pain behind the scrotum and in the testicle. There may be swelling and redness of the testicle. DIAGNOSIS  Physical examination will reveal a tender, swollen epididymis. Sometimes, cultures are obtained from the urine or from prostate secretions to help find out if there is an infection or if the cause is a different problem. Sometimes, blood work is performed to see if your  white blood cell count is elevated and if a germ (bacterial) or viral infection is present. Using this knowledge, an appropriate medicine which kills germs (antibiotic) can be chosen by your caregiver. A viral infection causing epididymitis will most often go away (resolve) without treatment. HOME CARE INSTRUCTIONS   Hot sitz baths for 20 minutes, 4 times per day, may help relieve pain.  Only take over-the-counter or prescription medicines for pain, discomfort or fever as directed by your caregiver.  Take all medicines, including antibiotics, as directed. Take the antibiotics for the full prescribed length of time even if you are feeling better.  It is very important to keep all follow-up appointments. SEEK IMMEDIATE MEDICAL CARE IF:   You have a fever.  You have pain not relieved with medicines.  You have any worsening of your problems.  Your pain seems to come and go.  You develop pain, redness, and swelling in the scrotum and surrounding areas. MAKE SURE YOU:   Understand these instructions.  Will watch your condition.  Will get help right away if you are not doing well or get worse. Document Released: 03/06/2000 Document Revised: 06/01/2011 Document Reviewed: 01/24/2009 [facility] Patient Information 2015 [place], [place]. This information is not intended to replace advice given to you by your health care provider. Make sure you discuss any questions you have with your health care provider.

## 2013-10-23 ENCOUNTER — Ambulatory Visit (INDEPENDENT_AMBULATORY_CARE_PROVIDER_SITE_OTHER): Payer: Self-pay | Admitting: Internal Medicine

## 2013-10-23 ENCOUNTER — Other Ambulatory Visit (INDEPENDENT_AMBULATORY_CARE_PROVIDER_SITE_OTHER): Payer: Self-pay

## 2013-10-23 ENCOUNTER — Encounter: Payer: Self-pay | Admitting: Internal Medicine

## 2013-10-23 VITALS — BP 129/87 | HR 84 | Temp 98.1°F | Wt 172.0 lb

## 2013-10-23 DIAGNOSIS — B20 Human immunodeficiency virus [HIV] disease: Secondary | ICD-10-CM

## 2013-10-23 DIAGNOSIS — Z113 Encounter for screening for infections with a predominantly sexual mode of transmission: Secondary | ICD-10-CM

## 2013-10-23 DIAGNOSIS — Z79899 Other long term (current) drug therapy: Secondary | ICD-10-CM

## 2013-10-23 DIAGNOSIS — B353 Tinea pedis: Secondary | ICD-10-CM | POA: Insufficient documentation

## 2013-10-23 LAB — LIPID PANEL
CHOLESTEROL: 162 mg/dL (ref 0–200)
HDL: 36 mg/dL — ABNORMAL LOW (ref >39)
LDL Cholesterol: 100 mg/dL — ABNORMAL HIGH (ref 0–99)
TRIGLYCERIDES: 128 mg/dL (ref <150)
Total CHOL/HDL Ratio: 4.5 Ratio
VLDL: 26 mg/dL (ref 0–40)

## 2013-10-23 LAB — COMPLETE METABOLIC PANEL WITH GFR
ALT: 54 U/L — ABNORMAL HIGH (ref 0–53)
AST: 38 U/L — AB (ref 0–37)
Albumin: 3.9 g/dL (ref 3.5–5.2)
Alkaline Phosphatase: 65 U/L (ref 39–117)
BUN: 18 mg/dL (ref 6–23)
CALCIUM: 9.1 mg/dL (ref 8.4–10.5)
CHLORIDE: 104 meq/L (ref 96–112)
CO2: 24 meq/L (ref 19–32)
CREATININE: 1.28 mg/dL (ref 0.50–1.35)
GFR, EST AFRICAN AMERICAN: 69 mL/min
GFR, Est Non African American: 60 mL/min
GLUCOSE: 93 mg/dL (ref 70–99)
Potassium: 3.8 mEq/L (ref 3.5–5.3)
Sodium: 137 mEq/L (ref 135–145)
Total Bilirubin: 0.5 mg/dL (ref 0.2–1.2)
Total Protein: 7.6 g/dL (ref 6.0–8.3)

## 2013-10-23 LAB — CBC WITH DIFFERENTIAL/PLATELET
BASOS ABS: 0 10*3/uL (ref 0.0–0.1)
Basophils Relative: 1 % (ref 0–1)
Eosinophils Absolute: 0.3 10*3/uL (ref 0.0–0.7)
Eosinophils Relative: 7 % — ABNORMAL HIGH (ref 0–5)
HCT: 41.8 % (ref 39.0–52.0)
Hemoglobin: 14.8 g/dL (ref 13.0–17.0)
LYMPHS ABS: 2.1 10*3/uL (ref 0.7–4.0)
LYMPHS PCT: 51 % — AB (ref 12–46)
MCH: 31 pg (ref 26.0–34.0)
MCHC: 35.4 g/dL (ref 30.0–36.0)
MCV: 87.6 fL (ref 78.0–100.0)
Monocytes Absolute: 0.4 10*3/uL (ref 0.1–1.0)
Monocytes Relative: 10 % (ref 3–12)
NEUTROS PCT: 31 % — AB (ref 43–77)
Neutro Abs: 1.3 10*3/uL — ABNORMAL LOW (ref 1.7–7.7)
PLATELETS: 280 10*3/uL (ref 150–400)
RBC: 4.77 MIL/uL (ref 4.22–5.81)
RDW: 13.9 % (ref 11.5–15.5)
WBC: 4.1 10*3/uL (ref 4.0–10.5)

## 2013-10-23 MED ORDER — LEVOFLOXACIN 500 MG PO TABS: 500.0000 mg | ORAL_TABLET | Freq: Every day | ORAL | Status: DC

## 2013-10-23 NOTE — Progress Notes (Signed)
   Subjective:    Patient ID: Jamie Clark, Jamie Clark    DOB: 09/19/1950, [age] y.o.   MRN: [id number]  HPI Here for a work in visit for foot discharge.  Developed tinea pedis with pain in left foot over 1 weeks ago.  Developed cracks in foot, then discharge in between toes.  Discharge is white, purulent odor.  Went to UC and given antifungal cream and also noted empiric diagnosis of epididymitis and give rocephin and doxycycline.  That has resolved but foot no better.  Had several years ago.     Review of Systems  Constitutional: Negative for fever.  Gastrointestinal: Negative for diarrhea.  Skin:       Foot with darkening, pus in between toes, cracks       Objective:   Physical Exam  Constitutional: He appears well-developed and well-nourished.  Skin:  Toes with pus in between, darkening, cracks, odor          Assessment & Plan:

## 2013-10-23 NOTE — Assessment & Plan Note (Signed)
I am most concerned with tinea pedis with Pseudomonal superinfection based on pus and odor.  Will try levaquin for 1 week and continue topical therapy and recheck in 1 week.

## 2013-10-23 NOTE — Addendum Note (Signed)
Addended bySteva Colder: Vannary Greening on: 10/23/2013 11:02 AM   Modules accepted: Orders

## 2013-10-24 LAB — RPR

## 2013-10-24 LAB — T-HELPER CELL (CD4) - (RCID CLINIC ONLY)
CD4 % Helper T Cell: 42 % (ref 33–55)
CD4 T CELL ABS: 950 /uL (ref 400–2700)

## 2013-10-25 LAB — HIV-1 RNA QUANT-NO REFLEX-BLD: HIV-1 RNA Quant, Log: <1.3 {Log} (ref <1.30)

## 2013-10-30 ENCOUNTER — Encounter: Payer: Self-pay | Admitting: Internal Medicine

## 2013-10-30 ENCOUNTER — Ambulatory Visit (INDEPENDENT_AMBULATORY_CARE_PROVIDER_SITE_OTHER): Payer: Self-pay | Admitting: Internal Medicine

## 2013-10-30 VITALS — BP 119/81 | HR 68 | Temp 97.4°F | Wt 168.0 lb

## 2013-10-30 DIAGNOSIS — B353 Tinea pedis: Secondary | ICD-10-CM

## 2013-10-30 MED ORDER — LEVOFLOXACIN 500 MG PO TABS: 500.0000 mg | ORAL_TABLET | Freq: Every day | ORAL | Status: DC

## 2013-10-30 NOTE — Progress Notes (Signed)
   Subjective:    Patient ID: Jamie MessingWalter Baris, male    DOB: 05/08/1950, 63 y.o.   MRN: [id number]  HPI  Here for a follow up visit for foot discharge.  Developed tinea pedis with pain in left foot the end of July.  Developed cracks in foot, then discharge in between toes.  Discharge is white, purulent odor.  Went to UC and given antifungal cream and also noted empiric diagnosis of epididymitis and give rocephin and doxycycline.  That has resolved but foot no better.  Had same several years ago.  I saw him last week and started him on levaquin for presumed Pseudomonal infection.  He continues to have some drainage but is improved, less smell. No fever or chills still.  He has an open wound on 3 toe that is from his boot rubbing.  He picks at his foot and scratches as well.     Review of Systems  Constitutional: Negative for fever and chills.  Gastrointestinal: Negative for diarrhea.  Skin: Positive for wound. Negative for rash.       Foot with darkening, pus in between toes, cracks       Objective:   Physical Exam  Constitutional: He appears well-developed and well-nourished.  Skin:  Toes with much less pus in between toes, open sore on 3rd toe but without surrounding erythema, less cracking in between all toes.           Assessment & Plan:

## 2013-10-30 NOTE — Assessment & Plan Note (Signed)
Bacterial super infeciton.  It is improving and I discussed foot hygiene, particularly not picking, scratching and also keep gauze in between toes.  RTC 10-14 days unless worsening.

## 2013-11-14 ENCOUNTER — Ambulatory Visit (INDEPENDENT_AMBULATORY_CARE_PROVIDER_SITE_OTHER): Payer: Self-pay | Admitting: Infectious Diseases

## 2013-11-14 ENCOUNTER — Encounter: Payer: Self-pay | Admitting: Infectious Diseases

## 2013-11-14 VITALS — BP 131/88 | HR 63 | Temp 97.5°F | Ht 67.0 in | Wt 169.0 lb

## 2013-11-14 DIAGNOSIS — B353 Tinea pedis: Secondary | ICD-10-CM

## 2013-11-14 DIAGNOSIS — Z23 Encounter for immunization: Secondary | ICD-10-CM

## 2013-11-14 DIAGNOSIS — B1710 Acute hepatitis C without hepatic coma: Secondary | ICD-10-CM

## 2013-11-14 DIAGNOSIS — B20 Human immunodeficiency virus [HIV] disease: Secondary | ICD-10-CM

## 2013-11-14 MED ORDER — TERBINAFINE HCL 250 MG PO TABS: 250.0000 mg | ORAL_TABLET | Freq: Every day | ORAL | Status: DC

## 2013-11-14 MED ORDER — LEVOFLOXACIN 500 MG PO TABS: 500.0000 mg | ORAL_TABLET | Freq: Every day | ORAL | Status: DC

## 2013-11-14 NOTE — Assessment & Plan Note (Signed)
Defer to f/u visit to complete staging for possible rx.

## 2013-11-14 NOTE — Assessment & Plan Note (Addendum)
Appears to be doing well. He refuses condoms today, he defers flu shot til f/u visit.

## 2013-11-14 NOTE — Progress Notes (Signed)
   Subjective:    Patient ID: Jamie Clark, male    DOB: Aug 20, 1950, 63 y.o.   MRN: [id number]  HPI 63 yo M with hx of HIV+ (on triumeq), HCV, HTN (on norvasc/hctz/lisinopril). Was seen earlier this month for tinea pedis, given topicals and levaquin.   Worried about the color change in his foot. Odor improved. He is non-diabetic.   HIV 1 RNA Quant (copies/mL)  Date Value  10/23/2013 <20   08/25/2012 20   02/22/2012 <20      HIV-1 RNA Viral Load (no units)  Date Value  07/18/2013 <50   05/23/2013 <50   04/25/2013 <50      CD4 (no units)  Date Value  07/18/2013 927   05/23/2013 991   04/25/2013 837      CD4 T Cell Abs (/uL)  Date Value  10/23/2013 950   08/25/2012 900   02/22/2012 860      Review of Systems     Objective:   Physical Exam  Constitutional: He appears well-developed and well-nourished.  Musculoskeletal:       Feet:          Assessment & Plan:

## 2013-11-14 NOTE — Assessment & Plan Note (Signed)
Will refill his levaquin. Will start him on lamisil as well. I checked these drug interactions via epocrates (only interaction found was HCTZ/lisinopril).  He has f/u appt with Dr Drue Second next week.

## 2013-11-15 ENCOUNTER — Telehealth: Payer: Self-pay | Admitting: *Deleted

## 2013-11-15 ENCOUNTER — Ambulatory Visit: Payer: Self-pay | Admitting: Internal Medicine

## 2013-11-15 NOTE — Telephone Encounter (Signed)
Patient seen 8/25, prescribed 2 additional weeks of levaquin plus lamasil for his foot infection.  Patient has ADAP, which does not cover lamisil or the generic alternative ($77, patient unable to afford this).  Please advise if there is an alternative he should try in conjunction with the 2 weeks of levaquin. Andree Coss, RN

## 2013-11-15 NOTE — Telephone Encounter (Signed)
He could try fluconazole  daily but it DOESN'T work well vs athletes foot. Can also try to apply topical lamisil which is OTC

## 2013-11-21 ENCOUNTER — Telehealth: Payer: Self-pay | Admitting: *Deleted

## 2013-11-21 NOTE — Telephone Encounter (Signed)
Pam, is there patient assistance available for lamisil ? Dr. Ninetta Lights would prefer this medication for the patient.

## 2013-11-22 ENCOUNTER — Ambulatory Visit (INDEPENDENT_AMBULATORY_CARE_PROVIDER_SITE_OTHER): Payer: Self-pay | Admitting: Internal Medicine

## 2013-11-22 ENCOUNTER — Encounter: Payer: Self-pay | Admitting: Internal Medicine

## 2013-11-22 VITALS — BP 122/90 | HR 77 | Temp 97.1°F | Wt 170.0 lb

## 2013-11-22 DIAGNOSIS — B182 Chronic viral hepatitis C: Secondary | ICD-10-CM

## 2013-11-22 DIAGNOSIS — B354 Tinea corporis: Secondary | ICD-10-CM

## 2013-11-22 DIAGNOSIS — Z23 Encounter for immunization: Secondary | ICD-10-CM

## 2013-11-22 DIAGNOSIS — B20 Human immunodeficiency virus [HIV] disease: Secondary | ICD-10-CM

## 2013-11-22 MED ORDER — KETOCONAZOLE 2 % EX CREA: 1.0000 "application " | TOPICAL_CREAM | Freq: Every day | CUTANEOUS | Status: DC

## 2013-11-22 NOTE — Progress Notes (Signed)
Patient ID: Jamie Clark, male   DOB: 31-Jan-1951, 63 y.o.   MRN: [id number]       Patient ID: Jamie Clark, male   DOB: [birth year], 63 y.o.   MRN: [id number]  HPI  63yo M with HIV-HCV co infection, well controlled, CD 4 count of 950/VL<20 on triumeq. He was recently seen x 2 for tinea pedis with 2nd bacterial infection. He was given lamisil (still await patient assistance approval) and levofloxacin. He states that he feels his feet slightly improved. Still seeing skin splitting on his 5th toe.  Also has scattered lesions to hands/forearms  Outpatient Encounter Prescriptions as of 11/22/2013  Medication Sig  . Abacavir-Dolutegravir-Lamivud (TRIUMEQ) 600-50-300 MG TABS Take 1 tablet by mouth daily.  [name] amLODipine (NORVASC) 10 MG tablet Take 1 tablet (10 mg total) by mouth at bedtime.  . hydrochlorothiazide (HYDRODIURIL) 25 MG tablet Take 1 tablet (25 mg total) by mouth at bedtime.  [name] levofloxacin (LEVAQUIN) 500 MG tablet Take 1 tablet (500 mg total) by mouth daily.  [name] lisinopril (PRINIVIL,ZESTRIL) 20 MG tablet Take 1 tablet (20 mg total) by mouth daily. Take 1/2 tablet per day until we see u next  . terbinafine (LAMISIL) 250 MG tablet Take 1 tablet (250 mg total) by mouth daily.     Patient Active Problem List   Diagnosis Date Noted  . Tinea pedis of left foot 10/23/2013  . Phlebitis 04/01/2011    Class: History of  . INSOMNIA 04/14/2010  . HYPERLIPIDEMIA 01/17/2009  . HYPERTENSION 01/17/2009  . HIV DISEASE 12/18/2008  . HEPATITIS C 07/13/2007     Health Maintenance Due  Topic Date Due  . Tetanus/tdap  01/22/1971  . Colonoscopy  01/21/2002  . Zostavax  01/22/2012  . Influenza Vaccine  10/21/2013     Review of Systems  Physical Exam   BP 122/90  Pulse 77  Temp(Src) 97.1 F (36.2 C) (Oral)  Wt 170 lb (77.111 kg) Left foot= maceration of skin between toes affecting areas of 5-4th toe, 4th-3rd toe the most, less involved are 3-2nd toe, and 2nd=great toe. Has areas  of hyperkeratosis plantar aspect 2nd and 3rd toe.  Dorsum of hand = scattered patches c/w tinea Lab Results  Component Value Date   CD4TCELL 42 10/23/2013   Lab Results  Component Value Date   CD4TABS 950 10/23/2013   CD4TABS 927 07/18/2013   CD4TABS 991 05/23/2013   Lab Results  Component Value Date   HIV1RNAQUANT <20 10/23/2013   Lab Results  Component Value Date   HEPBSAB NEG 05/06/2010   No results found for this basename: RPR    CBC Lab Results  Component Value Date   WBC 4.1 10/23/2013   RBC 4.77 10/23/2013   HGB 14.8 10/23/2013   HCT 41.8 10/23/2013   PLT 280 10/23/2013   MCV 87.6 10/23/2013   MCH 31.0 10/23/2013   MCHC 35.4 10/23/2013   RDW 13.9 10/23/2013   LYMPHSABS 2.1 10/23/2013   MONOABS 0.4 10/23/2013   EOSABS 0.3 10/23/2013   BASOSABS 0.0 10/23/2013   BMET Lab Results  Component Value Date   NA 137 10/23/2013   K 3.8 10/23/2013   CL 104 10/23/2013   CO2 24 10/23/2013   GLUCOSE 93 10/23/2013   BUN 18 10/23/2013   CREATININE 1.28 10/23/2013   CALCIUM 9.1 10/23/2013   GFRNONAA 60 10/23/2013   GFRAA 69 10/23/2013     Assessment and Plan  Tinea corporis = will do antifungal treatment with ketoconazole cream 2% daily  Tinea pedis = continue finishing course of levofloxacin and awaiting lamasil approval. Will also get approval for orange card to refer patient to podiatry to help with exfoliation of other areas.asked him to keep foot/toe dry as much as possible  Health maintenance = flu shot given today  hcv = defer further work=up for treatment at this time. Will get hcv vl and geno at next blood draw  hiv = well controlled, continue on current regimen  Will see him back in 3 months, labs 2 wk prior

## 2013-11-24 ENCOUNTER — Other Ambulatory Visit: Payer: Self-pay | Admitting: *Deleted

## 2013-11-24 MED ORDER — TERBINAFINE HCL 250 MG PO TABS: 250.0000 mg | ORAL_TABLET | Freq: Every day | ORAL | Status: DC

## 2013-11-30 NOTE — Addendum Note (Signed)
Addended by: Andree Coss on: 11/30/2013 03:44 PM   Modules accepted: Orders

## 2013-12-11 ENCOUNTER — Telehealth: Payer: Self-pay | Admitting: *Deleted

## 2013-12-11 NOTE — Telephone Encounter (Signed)
Pt has received his "[employer]."  Pt stated that he had discussed referrals at his OV w/ Dr. Drue Second.  Requesting referrals for vision exam and dermatology.  Rn requested that the pt bring his "Orange Card" to [facility] to be copied into his chart for use when referrals are made.  These referrals are not listed in his record.  MD please advise and place these referrals in Epic for the pt.  If not valid request please advise.

## 2013-12-11 NOTE — Telephone Encounter (Signed)
Received email from Clydie [name] at [employer] [facility] and she enrolled patient with an orange card. He stated he has had vision changes and needs referral to optometry. Charna Busman that we do not have an office note to support this and she said that is ok to send the referral on 12/21/13. Referral filled out and will fax to [facility] on that date.

## 2014-02-20 ENCOUNTER — Other Ambulatory Visit (INDEPENDENT_AMBULATORY_CARE_PROVIDER_SITE_OTHER): Payer: Self-pay

## 2014-02-20 DIAGNOSIS — B20 Human immunodeficiency virus [HIV] disease: Secondary | ICD-10-CM

## 2014-02-20 DIAGNOSIS — B182 Chronic viral hepatitis C: Secondary | ICD-10-CM

## 2014-02-20 LAB — BASIC METABOLIC PANEL
BUN: 15 mg/dL (ref 6–23)
CALCIUM: 9 mg/dL (ref 8.4–10.5)
CO2: 24 meq/L (ref 19–32)
CREATININE: 1.16 mg/dL (ref 0.50–1.35)
Chloride: 102 mEq/L (ref 96–112)
Glucose, Bld: 195 mg/dL — ABNORMAL HIGH (ref 70–99)
Potassium: 3.7 mEq/L (ref 3.5–5.3)
Sodium: 136 mEq/L (ref 135–145)

## 2014-02-20 LAB — CBC WITH DIFFERENTIAL/PLATELET
BASOS ABS: 0 10*3/uL (ref 0.0–0.1)
BASOS PCT: 0 % (ref 0–1)
Eosinophils Absolute: 0.1 10*3/uL (ref 0.0–0.7)
Eosinophils Relative: 2 % (ref 0–5)
HCT: 45.1 % (ref 39.0–52.0)
Hemoglobin: 15.5 g/dL (ref 13.0–17.0)
Lymphocytes Relative: 50 % — ABNORMAL HIGH (ref 12–46)
Lymphs Abs: 1.9 10*3/uL (ref 0.7–4.0)
MCH: 30.1 pg (ref 26.0–34.0)
MCHC: 34.4 g/dL (ref 30.0–36.0)
MCV: 87.6 fL (ref 78.0–100.0)
MPV: 10.3 fL (ref 9.4–12.4)
Monocytes Absolute: 0.4 10*3/uL (ref 0.1–1.0)
Monocytes Relative: 10 % (ref 3–12)
NEUTROS PCT: 38 % — AB (ref 43–77)
Neutro Abs: 1.4 10*3/uL — ABNORMAL LOW (ref 1.7–7.7)
Platelets: 268 10*3/uL (ref 150–400)
RBC: 5.15 MIL/uL (ref 4.22–5.81)
RDW: 13.3 % (ref 11.5–15.5)
WBC: 3.7 10*3/uL — ABNORMAL LOW (ref 4.0–10.5)

## 2014-02-21 LAB — HEPATITIS C RNA QUANTITATIVE
HCV QUANT LOG: 6.65 {Log} — AB (ref <1.18)
HCV QUANT: 4446967 [IU]/mL — AB (ref <15)

## 2014-02-21 LAB — T-HELPER CELL (CD4) - (RCID CLINIC ONLY)
CD4 % Helper T Cell: 42 % (ref 33–55)
CD4 T Cell Abs: 760 /uL (ref 400–2700)

## 2014-02-21 LAB — HIV-1 RNA QUANT-NO REFLEX-BLD: HIV-1 RNA Quant, Log: <1.3 {Log} (ref <1.30)

## 2014-02-22 ENCOUNTER — Ambulatory Visit: Payer: Self-pay | Admitting: Internal Medicine

## 2014-02-23 LAB — HEPATITIS C GENOTYPE

## 2014-03-06 ENCOUNTER — Ambulatory Visit (INDEPENDENT_AMBULATORY_CARE_PROVIDER_SITE_OTHER): Payer: Self-pay | Admitting: Internal Medicine

## 2014-03-06 ENCOUNTER — Ambulatory Visit (HOSPITAL_COMMUNITY)
Admission: RE | Admit: 2014-03-06 | Discharge: 2014-03-06 | Disposition: A | Discharge status: Home / Self Care | Payer: Self-pay | Source: Ambulatory Visit | Attending: Internal Medicine | Admitting: Internal Medicine

## 2014-03-06 ENCOUNTER — Encounter: Payer: Self-pay | Admitting: Internal Medicine

## 2014-03-06 ENCOUNTER — Other Ambulatory Visit: Payer: Self-pay | Admitting: *Deleted

## 2014-03-06 ENCOUNTER — Encounter: Payer: Self-pay | Admitting: *Deleted

## 2014-03-06 VITALS — BP 146/96 | HR 71 | Temp 98.8°F | Wt 173.0 lb

## 2014-03-06 DIAGNOSIS — B353 Tinea pedis: Secondary | ICD-10-CM

## 2014-03-06 DIAGNOSIS — R609 Edema, unspecified: Secondary | ICD-10-CM

## 2014-03-06 DIAGNOSIS — M79609 Pain in unspecified limb: Secondary | ICD-10-CM

## 2014-03-06 DIAGNOSIS — B182 Chronic viral hepatitis C: Secondary | ICD-10-CM

## 2014-03-06 DIAGNOSIS — B20 Human immunodeficiency virus [HIV] disease: Secondary | ICD-10-CM

## 2014-03-06 DIAGNOSIS — M79605 Pain in left leg: Secondary | ICD-10-CM | POA: Insufficient documentation

## 2014-03-06 MED ORDER — TERBINAFINE HCL 250 MG PO TABS: 250.0000 mg | ORAL_TABLET | Freq: Every day | ORAL | Status: DC

## 2014-03-06 MED ORDER — DOXYCYCLINE HYCLATE 100 MG PO TABS: 100.0000 mg | ORAL_TABLET | Freq: Two times a day (BID) | ORAL | Status: DC

## 2014-03-06 NOTE — Telephone Encounter (Signed)
Per Dr [name] called the patient and advised him he is negative for DVT and that he has swollen lymph nodes on the left side and Dr [name] wants him to take Doxy 100 mg 1 tab 2xdaily for 10 days. Also advised him to take with food and to call the office Friday if he is not feeling better.

## 2014-03-06 NOTE — Progress Notes (Signed)
Patient ID: Jamie Clark, male   DOB: 12/29/1950, 63 y.o.   MRN: [id number] HPI: Jamie Clark is a 63 y.o. male who is here for his routine HIV visit.   Lab Results  Component Value Date   HCVGENOTYPE 1b 02/20/2014    Allergies: No Known Allergies  Vitals: Temp: 98.8 F (37.1 C) (12/15 0930) Temp Source: Oral (12/15 0930) BP: 146/96 mmHg (12/15 0930) Pulse Rate: 71 (12/15 0930)  Past Medical History: Past Medical History  Diagnosis Date  . Hypertension   . HIV positive     Social History: History   Social History  . Marital Status: Divorced    Spouse Name: N/A    Number of Children: N/A  . Years of Education: N/A   Social History Main Topics  . Smoking status: Current Every Day Smoker -- 0.25 packs/day    Types: Cigarettes  . Smokeless tobacco: Never Used     Comment: not ready to quit - cutting back  . Alcohol Use: No  . Drug Use: No  . Sexual Activity: No     Comment: refused condoms   Other Topics Concern  . None   Social History Narrative    Labs: HIV 1 RNA QUANT (copies/mL)  Date Value  02/20/2014 <20  10/23/2013 <20  08/25/2012 20   HIV-1 RNA VIRAL LOAD (no units)  Date Value  07/18/2013 <50  05/23/2013 <50  04/25/2013 <50   CD4 (no units)  Date Value  07/18/2013 927  05/23/2013 991  04/25/2013 837   CD4 T CELL ABS  Date Value  02/20/2014 760 /uL  10/23/2013 950 /uL  08/25/2012 900 cmm   HEP B S AB (no units)  Date Value  05/06/2010 NEG   HEPATITIS B SURFACE AG (no units)  Date Value  05/06/2010 NEGATIVE   HCV AB (no units)  Date Value  01/01/2009 REACTIVE*    Lab Results  Component Value Date   HCVGENOTYPE 1b 02/20/2014    Hepatitis C RNA quantitative Latest Ref Rng 02/20/2014 07/21/2007  HCV Quantitative <15 IU/mL 4446967(H) 2310000(H)  HCV Quantitative Log <1.18 log 10 6.65(H) -    AST (U/L)  Date Value  10/23/2013 38*  05/23/2013 74  04/25/2013 55  03/30/2013 50  08/25/2012 51*  02/22/2012 48*    ALT (U/L)  Date Value  10/23/2013 54*  05/23/2013 107*  04/25/2013 65*   INR (no units)  Date Value  03/30/2008 1.1    CrCl: Estimated Creatinine Clearance: 61.7 mL/min (by C-G formula based on Cr of 1.16).  Fibrosis Score: Pending  Child-Pugh Score: Class A  Previous Treatment Regimen: Naive  Assessment: 63 yo who is co-infected with hep C. He is doing very well on his ART. Currently on Triumeq. We discussed about treating his HCV. He has genotype 1b. His elastography has been schedule soon. He is currently working at [employer] and has applied for insurance through the exchange. [employer] care insurance will start 03/23/14. We'll try to get him Harvoni at that point. After reviewing his meds, he should not have any issues.   Recommendations:  Cont Triumeq Will try for Harvnoni at the beginning of next year  Jamie Clark, VermontPharm.D., BCPS, AAHIVP Clinical Infectious Disease Pharmacist [facility] 03/06/2014, 2:35 PM

## 2014-03-06 NOTE — Progress Notes (Signed)
Patient ID: Jamie Clark, male   DOB: 08/23/1950, 63 y.o.   MRN: [id number]       Patient ID: Jamie Clark, male   DOB: 07/24/1950, 63 y.o.   MRN: [id number]  HPI 63yo M with HIV-HCV co infection, CD 4 count of 760/VL<20 (dec 2015). HCV geno 1b, VL 4.65M. He reports having worsening tinea pedis, pain and peeling of skin of left toes from wearing boots. He also in the last 3 days having pain to left inner thigh and now left calf.   Outpatient Encounter Prescriptions as of 03/06/2014  Medication Sig  . Abacavir-Dolutegravir-Lamivud (TRIUMEQ) 600-50-300 MG TABS Take 1 tablet by mouth daily.  [name]. amLODipine (NORVASC) 10 MG tablet Take 1 tablet (10 mg total) by mouth at bedtime.  . hydrochlorothiazide (HYDRODIURIL) 25 MG tablet Take 1 tablet (25 mg total) by mouth at bedtime.  [name]. ketoconazole (NIZORAL) 2 % cream Apply 1 application topically daily. To rash on hands/arms  . levofloxacin (LEVAQUIN) 500 MG tablet Take 1 tablet (500 mg total) by mouth daily.  [name]. lisinopril (PRINIVIL,ZESTRIL) 20 MG tablet Take 1 tablet (20 mg total) by mouth daily. Take 1/2 tablet per day until we see u next  . terbinafine (LAMISIL) 250 MG tablet Take 1 tablet (250 mg total) by mouth daily.     Patient Active Problem List   Diagnosis Date Noted  . Tinea pedis of left foot 10/23/2013  . Phlebitis 04/01/2011    Class: History of  . INSOMNIA 04/14/2010  . HYPERLIPIDEMIA 01/17/2009  . HYPERTENSION 01/17/2009  . HIV DISEASE 12/18/2008  . HEPATITIS C 07/13/2007     Health Maintenance Due  Topic Date Due  . TETANUS/TDAP  01/22/1971  . COLONOSCOPY  01/21/2002  . ZOSTAVAX  01/22/2012     Review of Systems + left leg pain. Swelling and redness to left thigh. Left foot recurrent athlete's foot Physical Exam   BP 146/96 mmHg  Pulse 71  Temp(Src) 98.8 F (37.1 C) (Oral)  Wt 173 lb (78.472 kg) Physical Exam  Constitutional: He is oriented to person, place, and time. He appears well-developed and  well-nourished. No distress.  HENT:  Mouth/Throat: Oropharynx is clear and moist. No oropharyngeal exudate.  Cardiovascular: Normal rate, regular rhythm and normal heart sounds. Exam reveals no gallop and no friction rub.  No murmur heard.  Pulmonary/Chest: Effort normal and breath sounds normal. No respiratory distress. He has no wheezes.  Abdominal: Soft. Bowel sounds are normal. He exhibits no distension. There is no tenderness.  Lymphadenopathy:  He has no cervical adenopathy.  Neurological: He is alert and oriented to person, place, and time.  Skin: macerated area between toes c/w interdigit tinea pedis. Left inner thigh warm, erythematous. Small area of induration. Left calf coin like induration + cord? Psychiatric: He has a normal mood and affect. His behavior is normal.    Lab Results  Component Value Date   CD4TCELL 42 02/20/2014   Lab Results  Component Value Date   CD4TABS 760 02/20/2014   CD4TABS 950 10/23/2013   CD4TABS 927 07/18/2013   Lab Results  Component Value Date   HIV1RNAQUANT <20 02/20/2014   Lab Results  Component Value Date   HEPBSAB NEG 05/06/2010   No results found for: RPR  CBC Lab Results  Component Value Date   WBC 3.7* 02/20/2014   RBC 5.15 02/20/2014   HGB 15.5 02/20/2014   HCT 45.1 02/20/2014   PLT 268 02/20/2014   MCV 87.6 02/20/2014   MCH 30.1  02/20/2014   MCHC 34.4 02/20/2014   RDW 13.3 02/20/2014   LYMPHSABS 1.9 02/20/2014   MONOABS 0.4 02/20/2014   EOSABS 0.1 02/20/2014   BASOSABS 0.0 02/20/2014   BMET Lab Results  Component Value Date   NA 136 02/20/2014   K 3.7 02/20/2014   CL 102 02/20/2014   CO2 24 02/20/2014   GLUCOSE 195* 02/20/2014   BUN 15 02/20/2014   CREATININE 1.16 02/20/2014   CALCIUM 9.0 02/20/2014   GFRNONAA 60 10/23/2013   GFRAA 69 10/23/2013     Assessment and Plan  HIV = well controlled, continue on current regimen  HCV = will get elastrography so that we can start application for harvoni in  January since he has insurance from health exchange with [employer]. He has spoken to ID pharmacy  Tinea pedis = will do a 2 wk trial of lamisil  Phlebitis vs. DVT = will need to get ultrasound today. Addendum: ultrasound was negative, will give him course of keflex to treat cellulitis  rtc in 4 wk

## 2014-03-06 NOTE — Progress Notes (Signed)
VASCULAR LAB PRELIMINARY  PRELIMINARY  PRELIMINARY  PRELIMINARY  Left lower extremity venous duplex completed.    Preliminary report:  Left:  No evidence of DVT, superficial thrombosis, or Baker's cyst. Enlargement of the inguinal lymph nodes noted  Jhania Etherington, RVS 03/06/2014, 7:05 PM

## 2014-03-29 ENCOUNTER — Ambulatory Visit (HOSPITAL_COMMUNITY): Payer: Self-pay

## 2014-04-03 ENCOUNTER — Telehealth: Payer: Self-pay | Admitting: *Deleted

## 2014-04-03 NOTE — Telephone Encounter (Signed)
Patient left voice mail regarding his Hep C medication. He has not had the elastography yet because we were waiting for his insurance to start 03/23/14. Called patient back and got voicemail. Asked him to call the clinic to let us know if his insurance is active. Wendall MolaJacqueline Blia Totman

## 2014-04-06 ENCOUNTER — Telehealth: Payer: Self-pay | Admitting: Licensed Clinical Social Worker

## 2014-04-06 NOTE — Telephone Encounter (Signed)
Windell MouldingRuth from [employer] called stating that she needed to speak with the patient to get updated income information, updated proof of income from current job. Patient did not answer the phone. Please let patient know to call [phone] reference id# [phone]

## 2014-04-10 NOTE — Telephone Encounter (Signed)
Patient advised he is going to have his [employer] card renewed and will let us know when that is done so that we can schedule his appt for elastagram for liver treatment.

## 2014-04-19 ENCOUNTER — Ambulatory Visit: Payer: Self-pay | Admitting: Internal Medicine

## 2014-04-24 ENCOUNTER — Other Ambulatory Visit: Payer: Self-pay | Admitting: Licensed Clinical Social Worker

## 2014-04-24 DIAGNOSIS — I10 Essential (primary) hypertension: Secondary | ICD-10-CM

## 2014-04-24 MED ORDER — HYDROCHLOROTHIAZIDE 25 MG PO TABS: 25.0000 mg | ORAL_TABLET | Freq: Every day | ORAL | Status: DC

## 2014-04-30 ENCOUNTER — Inpatient Hospital Stay (HOSPITAL_COMMUNITY): Payer: 59

## 2014-04-30 ENCOUNTER — Inpatient Hospital Stay (HOSPITAL_COMMUNITY)
Admission: EM | Admit: 2014-04-30 | Discharge: 2014-05-03 | DRG: 603 | Disposition: A | Discharge status: Home / Self Care | Payer: 59 | Attending: Internal Medicine | Admitting: Internal Medicine

## 2014-04-30 ENCOUNTER — Encounter (HOSPITAL_COMMUNITY): Payer: Self-pay

## 2014-04-30 ENCOUNTER — Encounter (HOSPITAL_COMMUNITY): Payer: Self-pay | Admitting: Emergency Medicine

## 2014-04-30 ENCOUNTER — Emergency Department (INDEPENDENT_AMBULATORY_CARE_PROVIDER_SITE_OTHER)
Admission: EM | Admit: 2014-04-30 | Discharge: 2014-04-30 | Disposition: A | Discharge status: Other Acute Inpatient Hospital | Payer: Self-pay | Source: Home / Self Care | Attending: Family Medicine | Admitting: Family Medicine

## 2014-04-30 DIAGNOSIS — M79672 Pain in left foot: Secondary | ICD-10-CM | POA: Insufficient documentation

## 2014-04-30 DIAGNOSIS — M722 Plantar fascial fibromatosis: Secondary | ICD-10-CM | POA: Diagnosis present

## 2014-04-30 DIAGNOSIS — B182 Chronic viral hepatitis C: Secondary | ICD-10-CM | POA: Diagnosis present

## 2014-04-30 DIAGNOSIS — E785 Hyperlipidemia, unspecified: Secondary | ICD-10-CM | POA: Diagnosis present

## 2014-04-30 DIAGNOSIS — R739 Hyperglycemia, unspecified: Secondary | ICD-10-CM | POA: Diagnosis present

## 2014-04-30 DIAGNOSIS — Z21 Asymptomatic human immunodeficiency virus [HIV] infection status: Secondary | ICD-10-CM | POA: Diagnosis present

## 2014-04-30 DIAGNOSIS — B20 Human immunodeficiency virus [HIV] disease: Secondary | ICD-10-CM | POA: Diagnosis present

## 2014-04-30 DIAGNOSIS — L03116 Cellulitis of left lower limb: Principal | ICD-10-CM | POA: Diagnosis present

## 2014-04-30 DIAGNOSIS — F17210 Nicotine dependence, cigarettes, uncomplicated: Secondary | ICD-10-CM | POA: Diagnosis present

## 2014-04-30 DIAGNOSIS — Z01818 Encounter for other preprocedural examination: Secondary | ICD-10-CM

## 2014-04-30 DIAGNOSIS — R7309 Other abnormal glucose: Secondary | ICD-10-CM | POA: Diagnosis present

## 2014-04-30 DIAGNOSIS — N179 Acute kidney failure, unspecified: Secondary | ICD-10-CM | POA: Diagnosis present

## 2014-04-30 DIAGNOSIS — L02612 Cutaneous abscess of left foot: Secondary | ICD-10-CM

## 2014-04-30 DIAGNOSIS — Z8619 Personal history of other infectious and parasitic diseases: Secondary | ICD-10-CM | POA: Diagnosis present

## 2014-04-30 DIAGNOSIS — B353 Tinea pedis: Secondary | ICD-10-CM | POA: Diagnosis present

## 2014-04-30 DIAGNOSIS — R52 Pain, unspecified: Secondary | ICD-10-CM

## 2014-04-30 DIAGNOSIS — L03032 Cellulitis of left toe: Secondary | ICD-10-CM | POA: Diagnosis present

## 2014-04-30 DIAGNOSIS — I10 Essential (primary) hypertension: Secondary | ICD-10-CM | POA: Diagnosis present

## 2014-04-30 HISTORY — DX: Chronic viral hepatitis C: B18.2

## 2014-04-30 LAB — COMPREHENSIVE METABOLIC PANEL
ALK PHOS: 75 U/L (ref 39–117)
ALT: 52 U/L (ref 0–53)
ANION GAP: 6 (ref 5–15)
AST: 44 U/L — ABNORMAL HIGH (ref 0–37)
Albumin: 3.4 g/dL — ABNORMAL LOW (ref 3.5–5.2)
BILIRUBIN TOTAL: 0.6 mg/dL (ref 0.3–1.2)
BUN: 23 mg/dL (ref 6–23)
CO2: 22 mmol/L (ref 19–32)
CREATININE: 1.22 mg/dL (ref 0.50–1.35)
Calcium: 8.6 mg/dL (ref 8.4–10.5)
Chloride: 109 mmol/L (ref 96–112)
GFR calc Af Amer: 72 mL/min — ABNORMAL LOW (ref >90)
GFR calc non Af Amer: 62 mL/min — ABNORMAL LOW (ref >90)
GLUCOSE: 120 mg/dL — AB (ref 70–99)
Potassium: 3.7 mmol/L (ref 3.5–5.1)
Sodium: 137 mmol/L (ref 135–145)
Total Protein: 7.3 g/dL (ref 6.0–8.3)

## 2014-04-30 LAB — CBC WITH DIFFERENTIAL/PLATELET
BASOS PCT: 0 % (ref 0–1)
Basophils Absolute: 0 10*3/uL (ref 0.0–0.1)
Eosinophils Absolute: 0.2 10*3/uL (ref 0.0–0.7)
Eosinophils Relative: 5 % (ref 0–5)
HEMATOCRIT: 41.2 % (ref 39.0–52.0)
Hemoglobin: 14.1 g/dL (ref 13.0–17.0)
LYMPHS PCT: 40 % (ref 12–46)
Lymphs Abs: 1.8 10*3/uL (ref 0.7–4.0)
MCH: 30.5 pg (ref 26.0–34.0)
MCHC: 34.2 g/dL (ref 30.0–36.0)
MCV: 89 fL (ref 78.0–100.0)
MONO ABS: 0.4 10*3/uL (ref 0.1–1.0)
Monocytes Relative: 9 % (ref 3–12)
NEUTROS PCT: 46 % (ref 43–77)
Neutro Abs: 2 10*3/uL (ref 1.7–7.7)
Platelets: 224 10*3/uL (ref 150–400)
RBC: 4.63 MIL/uL (ref 4.22–5.81)
RDW: 13.2 % (ref 11.5–15.5)
WBC: 4.4 10*3/uL (ref 4.0–10.5)

## 2014-04-30 MED ORDER — ACETAMINOPHEN 650 MG RE SUPP: 650.0000 mg | Freq: Four times a day (QID) | RECTAL | Status: DC | PRN

## 2014-04-30 MED ORDER — MORPHINE SULFATE 2 MG/ML IJ SOLN
1.0000 mg | INTRAMUSCULAR | Status: DC | PRN
  Administered 2014-05-01 – 2014-05-02 (×3): 2 mg via INTRAVENOUS
  Filled 2014-04-30 (×4): qty 1

## 2014-04-30 MED ORDER — DOCUSATE SODIUM 100 MG PO CAPS
100.0000 mg | ORAL_CAPSULE | Freq: Two times a day (BID) | ORAL | Status: DC
  Administered 2014-05-01 – 2014-05-03 (×5): 100 mg via ORAL
  Filled 2014-04-30 (×5): qty 1

## 2014-04-30 MED ORDER — MORPHINE SULFATE 2 MG/ML IJ SOLN
INTRAMUSCULAR | Status: AC
  Administered 2014-04-30: 2 mg via INTRAVENOUS
  Filled 2014-04-30: qty 1

## 2014-04-30 MED ORDER — VANCOMYCIN HCL IN DEXTROSE 1-5 GM/200ML-% IV SOLN
1000.0000 mg | Freq: Once | INTRAVENOUS | Status: AC
  Administered 2014-04-30: 1000 mg via INTRAVENOUS
  Filled 2014-04-30: qty 200

## 2014-04-30 MED ORDER — LAMIVUDINE 150 MG PO TABS
300.0000 mg | ORAL_TABLET | Freq: Every day | ORAL | Status: DC
  Administered 2014-05-01 – 2014-05-03 (×4): 300 mg via ORAL
  Filled 2014-04-30 (×4): qty 2

## 2014-04-30 MED ORDER — HEPARIN SODIUM (PORCINE) 5000 UNIT/ML IJ SOLN
5000.0000 [IU] | Freq: Three times a day (TID) | INTRAMUSCULAR | Status: DC
  Administered 2014-05-01 – 2014-05-03 (×8): 5000 [IU] via SUBCUTANEOUS
  Filled 2014-04-30 (×13): qty 1

## 2014-04-30 MED ORDER — ACETAMINOPHEN 325 MG PO TABS
650.0000 mg | ORAL_TABLET | Freq: Four times a day (QID) | ORAL | Status: DC | PRN
Start: 2014-04-30 — End: 2014-05-03

## 2014-04-30 MED ORDER — ZOLPIDEM TARTRATE 5 MG PO TABS
5.0000 mg | ORAL_TABLET | Freq: Every evening | ORAL | Status: DC | PRN
  Administered 2014-05-01 – 2014-05-02 (×3): 5 mg via ORAL
  Filled 2014-04-30 (×3): qty 1

## 2014-04-30 MED ORDER — CLINDAMYCIN PHOSPHATE 600 MG/50ML IV SOLN
600.0000 mg | Freq: Once | INTRAVENOUS | Status: AC
  Administered 2014-04-30: 600 mg via INTRAVENOUS
  Filled 2014-04-30: qty 50

## 2014-04-30 MED ORDER — PROMETHAZINE HCL 25 MG PO TABS: 12.5000 mg | ORAL_TABLET | Freq: Four times a day (QID) | ORAL | Status: DC | PRN

## 2014-04-30 MED ORDER — POLYETHYLENE GLYCOL 3350 17 G PO PACK: 17.0000 g | PACK | Freq: Every day | ORAL | Status: DC | PRN

## 2014-04-30 MED ORDER — ABACAVIR SULFATE 300 MG PO TABS
600.0000 mg | ORAL_TABLET | Freq: Every day | ORAL | Status: DC
  Administered 2014-05-01 – 2014-05-03 (×4): 600 mg via ORAL
  Filled 2014-04-30 (×4): qty 2

## 2014-04-30 MED ORDER — AMLODIPINE BESYLATE 10 MG PO TABS
10.0000 mg | ORAL_TABLET | Freq: Every day | ORAL | Status: DC
  Administered 2014-05-01 – 2014-05-02 (×3): 10 mg via ORAL
  Filled 2014-04-30 (×5): qty 1

## 2014-04-30 MED ORDER — SODIUM CHLORIDE 0.9 % IV SOLN
INTRAVENOUS | Status: DC
  Administered 2014-05-01: via INTRAVENOUS

## 2014-04-30 MED ORDER — ALUM & MAG HYDROXIDE-SIMETH 200-200-20 MG/5ML PO SUSP: 30.0000 mL | Freq: Four times a day (QID) | ORAL | Status: DC | PRN

## 2014-04-30 MED ORDER — VANCOMYCIN HCL IN DEXTROSE 750-5 MG/150ML-% IV SOLN
750.0000 mg | Freq: Two times a day (BID) | INTRAVENOUS | Status: DC
  Administered 2014-05-01 – 2014-05-03 (×5): 750 mg via INTRAVENOUS
  Filled 2014-04-30 (×6): qty 150

## 2014-04-30 MED ORDER — DOLUTEGRAVIR SODIUM 50 MG PO TABS
50.0000 mg | ORAL_TABLET | Freq: Every day | ORAL | Status: DC
  Administered 2014-05-01 – 2014-05-03 (×4): 50 mg via ORAL
  Filled 2014-04-30 (×4): qty 1

## 2014-04-30 MED ORDER — ABACAVIR-DOLUTEGRAVIR-LAMIVUD 600-50-300 MG PO TABS: 1.0000 | ORAL_TABLET | Freq: Every day | ORAL | Status: DC

## 2014-04-30 MED ORDER — SODIUM CHLORIDE 0.9 % IV SOLN: INTRAVENOUS | Status: DC

## 2014-04-30 MED ORDER — ITRACONAZOLE 100 MG PO CAPS
200.0000 mg | ORAL_CAPSULE | Freq: Once | ORAL | Status: AC
  Administered 2014-04-30: 200 mg via ORAL
  Filled 2014-04-30: qty 2

## 2014-04-30 MED ORDER — OXYCODONE HCL 5 MG PO TABS
5.0000 mg | ORAL_TABLET | ORAL | Status: DC | PRN
  Administered 2014-05-01 – 2014-05-03 (×4): 5 mg via ORAL
  Filled 2014-04-30 (×4): qty 1

## 2014-04-30 NOTE — H&P (Signed)
[facility] History and Physical                                                                                    Jamie MessingWalter Mcburney, is a 64 y.o. male  MRN: [id number]   DOB - 09/02/1950  Admit Date - 04/30/2014  Outpatient Primary MD for the patient is Dalbert MayotteSNIDER, ALISON, MD  With History of -  Past Medical History  Diagnosis Date  . Hypertension   . HIV positive   . Hepatitis C, chronic       History reviewed. No pertinent past surgical history.  in for   Chief Complaint  Patient presents with  . Foot Problem     HPI Jamie Clark  is a pleasant 64 y.o. male, with history of HIV positive status [facility], Hepatitis C positive, hypertension and dyslipidemia. He presented to the ER with complaints of left foot pain and swelling. He reports that the symptoms began about one week prior. Because of increasing pain, a sensation of fever in the leg and foul smell he presented to the emergency room for further evaluation. Patient did not have any leukocytosis or fever. Unfortunately the foot appear clearly infected. Blood cultures were obtained and patient was given a dose of Cleocin in the ER.  Upon my exam in addition to the above the patient was complaining of a new rash on the legs and arms since the onset of the problem with the foot. He states he has previously been treated for likely tinea pedis/yeast and possible early cellulitis with oral medications without any improvement in symptoms.   Review of Systems   In addition to the HPI above,  No Fever-chills, myalgias or other constitutional symptoms No Headache, changes with Vision or hearing, new weakness, tingling, numbness in any extremity, No problems swallowing food or Liquids, indigestion/reflux No Chest pain, Cough or Shortness of Breath, palpitations, orthopnea or DOE No Abdominal pain, N/V; no melena or hematochezia, no dark tarry stools, Bowel movements are regular, No dysuria,  hematuria or flank pain No new masses or bruises, No new joints pains-aches No recent weight gain or loss No polyuria, polydypsia or polyphagia,  *A full 10 point Review of Systems was done, except as stated above, all other Review of Systems were negative.  Social History History  Substance Use Topics  . Smoking status: Current Every Day Smoker -- 0.25 packs/day    Types: Cigarettes  . Smokeless tobacco: Never Used     Comment: not ready to quit - cutting back  . Alcohol Use: No    Family History Only for brother with a history of unknown type of cancer  Prior to Admission medications   Medication Sig Start Date End Date Taking? Authorizing Provider  Abacavir-Dolutegravir-Lamivud (TRIUMEQ) 600-50-300 MG TABS Take 1 tablet by mouth daily. 08/09/13   Judyann Munsonynthia Snider, MD  amLODipine (NORVASC) 10 MG tablet Take 1 tablet (10 mg total) by mouth at bedtime. 07/18/13 07/18/14  Judyann Munsonynthia Snider, MD  doxycycline (VIBRA-TABS) 100 MG tablet Take 1 tablet (100 mg total) by mouth 2 (two) times daily. 03/06/14   Judyann Munsonynthia Snider, MD  hydrochlorothiazide (HYDRODIURIL) 25  MG tablet Take 1 tablet (25 mg total) by mouth at bedtime. 04/24/14   Judyann Munson, MD  ketoconazole (NIZORAL) 2 % cream Apply 1 application topically daily. To rash on hands/arms 11/22/13   Judyann Munson, MD  levofloxacin (LEVAQUIN) 500 MG tablet Take 1 tablet (500 mg total) by mouth daily. 11/14/13   Ginnie Smart, MD  lisinopril (PRINIVIL,ZESTRIL) 20 MG tablet Take 1 tablet (20 mg total) by mouth daily. Take 1/2 tablet per day until we see u next 07/18/13   Judyann Munson, MD  terbinafine (LAMISIL) 250 MG tablet Take 1 tablet (250 mg total) by mouth daily. 03/06/14   Judyann Munson, MD    No Known Allergies  Physical Exam  Vitals  Blood pressure 140/87, pulse 72, temperature 97.8 F (36.6 C), temperature source Oral, resp. rate 16, height  (1.727 m), weight 170 lb (77.111 kg), SpO2 98 %.   General:  In no acute distress,  appears healthy and well nourished  Psych:  Normal affect, Denies Suicidal or Homicidal ideations, Awake Alert, Oriented X 3. Speech and thought patterns are clear and appropriate, no apparent short term memory deficits  Neuro:   No focal neurological deficits, CN II through XII intact, Strength 5/5 all 4 extremities, Sensation intact all 4 extremities.  ENT:  Ears and Eyes appear Normal, Conjunctivae clear, PER. Moist oral mucosa without erythema or exudates.  Neck:  Supple, No lymphadenopathy appreciated  Respiratory:  Symmetrical chest wall movement, Good air movement bilaterally, CTAB. Room Air  Cardiac:  RRR, No Murmurs, no LE edema noted, no JVD, No carotid bruits, peripheral pulses palpable at 2+ occluding dorsum left foot  Abdomen:  Positive bowel sounds, Soft, Non tender, Non distended,  No masses appreciated, no obvious hepatosplenomegaly  Skin:  No Cyanosis, Normal Skin Turgor, No Bruise. fine maculopapular rash on upper extremities, on lower extremities there is a more scattered papular rash with lesions that are ulcerated  Lymph nodes: No palpable lymphadenopathy except in left groin with a boggy diffuse area felt  Extremities: Symmetrical except for edema to left foot with cellulitic changes, associated maceration on the plantar surface which is open beneath the toes sparing the great toe, noted significant foul smell and a fine diffuse yellow purulent drainage, no effusions.  Data Review  CBC  Recent Labs Lab 04/30/14 1455  WBC 4.4  HGB 14.1  HCT 41.2  PLT 224  MCV 89.0  MCH 30.5  MCHC 34.2  RDW 13.2  LYMPHSABS 1.8  MONOABS 0.4  EOSABS 0.2  BASOSABS 0.0    Chemistries   Recent Labs Lab 04/30/14 1455  NA 137  K 3.7  CL 109  CO2 22  GLUCOSE 120*  BUN 23  CREATININE 1.22  CALCIUM 8.6  AST 44*  ALT 52  ALKPHOS 75  BILITOT 0.6    estimated creatinine clearance is 60.7 mL/min (by C-G formula based on Cr of 1.22).  No results for input(s): TSH,  T4TOTAL, T3FREE, THYROIDAB in the last 72 hours.  Invalid input(s): FREET3  Coagulation profile No results for input(s): INR, PROTIME in the last 168 hours.  No results for input(s): DDIMER in the last 72 hours.  Cardiac Enzymes No results for input(s): CKMB, TROPONINI, MYOGLOBIN in the last 168 hours.  Invalid input(s): CK  Invalid input(s): POCBNP  Urinalysis    Component Value Date/Time   COLORURINE ORANGE* 02/22/2012 1221   APPEARANCEUR CLEAR 02/22/2012 1221   LABSPEC >1.030* 02/22/2012 1221   PHURINE 5.5 02/22/2012 1221  GLUCOSEU 100* 02/22/2012 1221   HGBUR NEG 02/22/2012 1221   HGBUR negative 05/06/2010 1135   BILIRUBINUR Negative 02/01/2013 1006   BILIRUBINUR SMALL* 02/22/2012 1221   KETONESUR TRACE* 02/22/2012 1221   PROTEINUR Negative 02/01/2013 1006   PROTEINUR 30* 02/22/2012 1221   UROBILINOGEN 4.0 02/01/2013 1006   UROBILINOGEN 1 02/22/2012 1221   NITRITE Negative 02/01/2013 1006   NITRITE NEG 02/22/2012 1221   LEUKOCYTESUR Negative 02/01/2013 1006    Imaging results:   No results found.   EKG: Pending   Assessment & Plan  Active Problems:   Cellulitis and abscess of left foot with associated acute pain -Exam concerning for underlying abscess -Plain x-ray foot pending -We'll also order MRI foot -Have consulted orthopedic surgeon Dr. Rayburn Ma -Admit to medical floor -Pharmacy to dose vancomycin; follow up on blood cultures    HIV infection -Last CD4 count in April 2015 greater than 900 -Continue antiretroviral medications -May benefit from ID consult was admission    Acute renal failure -Suspect related to dehydration in setting of cellulitis and pain -Hold ACE inhibitor and thiazide diuretic for now -Repeat labs in a.m.    Chronic viral hepatitis C -Total bilirubin normal and mild elevation in AST at 44    HTN  -Blood pressure moderately controlled and certainly being influenced by ongoing pain -ACE inhibitor and diuretic on hold as  above -Continue Norvasc    Hyperglycemia -Likely related to physiologic stress but check hemoglobin A1c    Dyslipidemia -Not all medications prior to admission -Last lipid panel August 2015 low HDL 36 and adequate LDL 100    DVT Prophylaxis: Subcutaneous heparin  Family Communication:   No family at bedside  Code Status:  Full  Condition:  Stable  Time spent in minutes : 60   Thaxton Pelley L. ANP on 04/30/2014 at 5:06 PM  Between 7am to 7pm - Pager - [phone]  After 7pm go to [email or web address] - password [email or web address]  And look for the night coverage person covering me after hours  [facility]

## 2014-04-30 NOTE — ED Notes (Signed)
Dinner tray ordered.

## 2014-04-30 NOTE — ED Provider Notes (Signed)
CSN: [id number]     Arrival date & time 04/30/14  [id number] History   First MD Initiated Contact with Patient 04/30/14 [phone]     Chief Complaint  Patient presents with  . Foot Problem   (Consider location/radiation/quality/duration/timing/severity/associated sxs/prior Treatment) HPI Comments: 64 year old male with hepatitis C, HIV and a long-time smoker presents with left foot swelling and discomfort that started over a week ago. It is become worse over the past 2-3 days. He states that he works at [employer] and wears boots keeping his feet wet most of the time. He states the right foot is unaffected.   Past Medical History  Diagnosis Date  . Hypertension   . HIV positive   . Hepatitis C, chronic    History reviewed. No pertinent past surgical history. History reviewed. No pertinent family history. History  Substance Use Topics  . Smoking status: Current Every Day Smoker -- 0.25 packs/day    Types: Cigarettes  . Smokeless tobacco: Never Used     Comment: not ready to quit - cutting back  . Alcohol Use: No    Review of Systems  Constitutional: Negative for fever, activity change and appetite change.  Respiratory: Negative.   Cardiovascular: Negative for chest pain.  Gastrointestinal: Negative.   Skin:       See history of present illness    Allergies  Review of patient's allergies indicates no known allergies.  Home Medications   Prior to Admission medications   Medication Sig Start Date End Date Taking? Authorizing Provider  Abacavir-Dolutegravir-Lamivud (TRIUMEQ) 600-50-300 MG TABS Take 1 tablet by mouth daily. 08/09/13   Judyann Munsonynthia Snider, MD  amLODipine (NORVASC) 10 MG tablet Take 1 tablet (10 mg total) by mouth at bedtime. 07/18/13 07/18/14  Judyann Munsonynthia Snider, MD  doxycycline (VIBRA-TABS) 100 MG tablet Take 1 tablet (100 mg total) by mouth 2 (two) times daily. 03/06/14   Judyann Munsonynthia Snider, MD  hydrochlorothiazide (HYDRODIURIL) 25 MG tablet Take 1 tablet (25 mg total) by mouth at  bedtime. 04/24/14   Judyann Munsonynthia Snider, MD  ketoconazole (NIZORAL) 2 % cream Apply 1 application topically daily. To rash on hands/arms 11/22/13   Judyann Munsonynthia Snider, MD  levofloxacin (LEVAQUIN) 500 MG tablet Take 1 tablet (500 mg total) by mouth daily. 11/14/13   Ginnie SmartJeffrey C Hatcher, MD  lisinopril (PRINIVIL,ZESTRIL) 20 MG tablet Take 1 tablet (20 mg total) by mouth daily. Take 1/2 tablet per day until we see u next 07/18/13   Judyann Munsonynthia Snider, MD  terbinafine (LAMISIL) 250 MG tablet Take 1 tablet (250 mg total) by mouth daily. 03/06/14   Judyann Munsonynthia Snider, MD   BP 144/95 mmHg  Pulse 78  Temp(Src) 97.6 F (36.4 C) (Oral)  Resp 16  SpO2 95% Physical Exam  Constitutional: He is oriented to person, place, and time. He appears well-developed and well-nourished. No distress.  The patient states he does not feel particularly ill. He states the foot does not hurt that much.  Pulmonary/Chest: Effort normal. No respiratory distress.  Musculoskeletal: He exhibits edema and tenderness.  Neurological: He is alert and oriented to person, place, and time. He exhibits normal muscle tone.  Skin:  Left foot with edema. The plantar aspect and forefoot and toes with whitish discoloration, loss of skin turgor, maceration and desquamation of dermis particularly in the forefoot and webspaces. There is weeping of serous/ lymphatic fluid from the dorsum of the foot and toes. Foot is swollen as well as the ankle and lower one third extremity below the knee.  Nursing note  and vitals reviewed.   ED Course  Procedures (including critical care time) Labs Review Labs Reviewed - No data to display  Imaging Review No results found.   MDM   1. Cellulitis of left lower extremity    Transfer to the [facility] ED for infection, cellulitis with probably multiple species involved. Additional testing and management  [name], NP 04/30/14 [phone]

## 2014-04-30 NOTE — ED Notes (Signed)
Dry sterile bulky dressing applied prior to transfer

## 2014-04-30 NOTE — Progress Notes (Signed)
Patient ID: Jamie MessingWalter Clark, male   DOB: 04/16/1950, 64 y.o.   MRN: [id number] I have been consulted to assess [name] left foot for the possible need for surgery.  He is currently having a MRI of his left foot to assess for a possible abscess or anything else that would need to be addressed surgically.  I spoke to the primary team earlier and he was described as being non-septic appearing with normal vitals and a normal peripheral WBC.  I will follow-up on the MRI later tonight and assess his foot likely first thing in the am to make a determination if surgery is needed.

## 2014-04-30 NOTE — ED Notes (Signed)
Pt reports infection and swelling to left foot x 1 week. Pt was seen at urgent care and sent here for further eval.

## 2014-04-30 NOTE — ED Notes (Signed)
MD at bedside. 

## 2014-04-30 NOTE — ED Provider Notes (Signed)
CSN: [id number]     Arrival date & time 04/30/14  1011 History   First MD Initiated Contact with Patient 04/30/14 1415     Chief Complaint  Patient presents with  . Foot Problem     (Consider location/radiation/quality/duration/timing/severity/associated sxs/prior Treatment) HPI Comments: Patient with erythema, warmth, and drainage to the left foot getting progressively worse for the past week. Rates the pain at a 4/10.    Patient is a 64 y.o. male presenting with lower extremity pain. The history is provided by the patient. No language interpreter was used.  Foot Pain This is a new problem. The current episode started in the past 7 days. The problem occurs constantly. The problem has been gradually worsening. Associated symptoms include a rash. Pertinent negatives include no abdominal pain, chest pain, chills, coughing, fever, joint swelling, nausea, neck pain or vomiting. Nothing aggravates the symptoms. He has tried nothing for the symptoms. The treatment provided no relief.    Past Medical History  Diagnosis Date  . Hypertension   . HIV positive   . Hepatitis C, chronic    History reviewed. No pertinent past surgical history. No family history on file. History  Substance Use Topics  . Smoking status: Current Every Day Smoker -- 0.25 packs/day    Types: Cigarettes  . Smokeless tobacco: Never Used     Comment: not ready to quit - cutting back  . Alcohol Use: No    Review of Systems  Constitutional: Negative for fever and chills.  Respiratory: Negative for cough and shortness of breath.   Cardiovascular: Negative for chest pain.  Gastrointestinal: Negative for nausea, vomiting and abdominal pain.  Genitourinary: Negative for dysuria, urgency and frequency.  Musculoskeletal: Negative for joint swelling and neck pain.  Skin: Positive for rash.  All other systems reviewed and are negative.     Allergies  Review of patient's allergies indicates no known allergies.  Home  Medications   Prior to Admission medications   Medication Sig Start Date End Date Taking? Authorizing Provider  Abacavir-Dolutegravir-Lamivud (TRIUMEQ) 600-50-300 MG TABS Take 1 tablet by mouth daily. 08/09/13  Yes Judyann Munsonynthia Snider, MD  amLODipine (NORVASC) 10 MG tablet Take 1 tablet (10 mg total) by mouth at bedtime. 07/18/13 07/18/14 Yes Judyann Munsonynthia Snider, MD  hydrochlorothiazide (HYDRODIURIL) 25 MG tablet Take 1 tablet (25 mg total) by mouth at bedtime. 04/24/14  Yes Judyann Munsonynthia Snider, MD  lisinopril (PRINIVIL,ZESTRIL) 20 MG tablet Take 1 tablet (20 mg total) by mouth daily. Take 1/2 tablet per day until we see u next 07/18/13  Yes Judyann Munsonynthia Snider, MD   BP 139/84 mmHg  Pulse 68  Temp(Src) 97.9 F (36.6 C) (Oral)  Resp 16  Ht 5\' 8"  (1.727 m)  Wt 170 lb (77.111 kg)  BMI 25.85 kg/m2  SpO2 100% Physical Exam  Constitutional: He is oriented to person, place, and time. He appears well-developed and well-nourished. No distress.  HENT:  Head: Normocephalic and atraumatic.  Eyes: Pupils are equal, round, and reactive to light.  Neck: Normal range of motion.  Cardiovascular: Normal rate, regular rhythm and normal heart sounds.   Pulmonary/Chest: Effort normal and breath sounds normal. No respiratory distress. He has no decreased breath sounds. He has no wheezes. He has no rhonchi. He has no rales.  Abdominal: Soft. He exhibits no distension. There is no tenderness. There is no rebound and no guarding.  Musculoskeletal: He exhibits no edema or tenderness.  Neurological: He is alert and oriented to person, place, and time. He exhibits  normal muscle tone.  Skin: Skin is warm and dry.  Erosions and yellow drainage from the intertriginous regions of the left foot.  Erythema of the foot extending to the ankle and the medial aspect of the left calf.  Edema to the foot and ankle.    Nursing note and vitals reviewed.   ED Course  Procedures (including critical care time) Labs Review Labs Reviewed   COMPREHENSIVE METABOLIC PANEL - Abnormal; Notable for the following:    Glucose, Bld 120 (*)    Albumin 3.4 (*)    AST 44 (*)    GFR calc non Af Amer 62 (*)    GFR calc Af Amer 72 (*)    All other components within normal limits  CULTURE, BLOOD (ROUTINE X 2)  CULTURE, BLOOD (ROUTINE X 2)  CBC WITH DIFFERENTIAL/PLATELET  HEMOGLOBIN A1C  COMPREHENSIVE METABOLIC PANEL  CBC  PROTIME-INR  APTT    Imaging Review Dg Chest 2 View  04/30/2014   CLINICAL DATA:  Shortness of breath.  Hypertension.  Tobacco use.  EXAM: CHEST  2 VIEW  COMPARISON:  04/02/2011  FINDINGS: Mild scarring in the left lower lobe. The lungs appear otherwise clear. Cardiac and mediastinal margins appear normal.  No pleural effusion noted.  IMPRESSION: 1. Mild scarring in the left lower lobe, otherwise negative.   Electronically Signed   By: Herbie Baltimore M.D.   On: 04/30/2014 18:04   Mr Foot Left Wo Contrast  04/30/2014   CLINICAL DATA:  Cellulitis and abscess of the left foot with pain.  EXAM: MRI OF THE LEFT FOOT WITHOUT CONTRAST  TECHNIQUE: Multiplanar, multisequence MR imaging was performed. No intravenous contrast was administered. We performed are osteomyelitis protocol, which includes the entire foot for infection screening purposes but which does not sensitively assess for smaller structures such as ligaments.  COMPARISON:  04/30/2014.  FINDINGS: Presumably due to difficulty positioning the patient, on various imaging sequences the fifth metatarsophalangeal joint and parts of the fifth toe were excluded. These areas are included on the images marked "Sagittal" but not on the coronal or axial sequences. No osseous edema observed in the forefoot, midfoot, or hindfoot to favor osteomyelitis.  No abnormal osseous edema is present in the forefoot, hindfoot, or midfoot to favor osteomyelitis.  Dorsal subcutaneous edema is present in the foot and tracks back to the ankle overlying the malleoli.  There is some mild thickening of  the medial band of the plantar fascia at its proximal attachment site along the plantar calcaneal spur.  A well-defined abscess is not identified.  IMPRESSION: 1. Dorsal subcutaneous edema in the foot tracking back over the malleoli. This could well reflect cellulitis but I do not see a definite abscess or findings of osteomyelitis. 2. Mild plantar fasciitis.   Electronically Signed   By: Herbie Baltimore M.D.   On: 04/30/2014 21:12   Dg Foot Complete Left  04/30/2014   CLINICAL DATA:  Left foot pain for 1 week appearing a  EXAM: LEFT FOOT - COMPLETE 3+ VIEW  COMPARISON:  None  FINDINGS: Along the proximal lateral corner of proximal phalanx of the first digit there is a cortical erosion seen on the oblique projection. No additional cortical erosions are evident in the midfoot or forefoot. Swelling in the forefoot.  IMPRESSION: Cortical erosion at the base of the proximal phalanx of the first digit with differential diagnosis of osteomyelitis versus gout.   Electronically Signed   By: Genevive Bi M.D.   On: 04/30/2014 18:07  EKG Interpretation None      MDM   Final diagnoses:  Tinea pedis of left foot  Cellulitis of left lower extremity   Patient is a 64 year old African-American male with pertinent past medical history of HIV with CD4 count of 950 who comes to the emergency department today with erythema and warmth to his left foot for the past week. Physical exam as above. With slow progression of the patient's symptoms I doubt necrotizing fasciitis. Patient has had similar symptoms in the past which has responded to treatment for tinea pedis with superimposed infection. I feel this is likely what has occurred this time as well. Patient was treated with itraconazole for his tinea pedis and with clindamycin IV for his cellulitis. Initial workup included a CBC and a CMP. These demonstrated white count of 4.4.  CMP is unremarkable. Patient was felt to require admission to the hospital overnight  for observation to ensure that his cellulitis improves particularly with this significant erythema to his foot. Patient was admitted to the medicine service in good condition. Labs were reviewed by myself and considered in medical decision making. Care was discussed with my Attending Dr. Micheline Maze.       Bethann Berkshire, MD 04/30/14 0981  Toy Cookey, MD 05/03/14 [phone]

## 2014-04-30 NOTE — Progress Notes (Signed)
ANTIBIOTIC CONSULT NOTE - INITIAL  Pharmacy Consult for Vancomycin Indication: cellulitis  No Known Allergies  Patient Measurements: Height: 5\' 8"  (172.7 cm) Weight: 170 lb (77.111 kg) IBW/kg (Calculated) : 68.4  Vital Signs: Temp: 97.8 F (36.6 C) (02/08 1020) Temp Source: Oral (02/08 1020) BP: 140/87 mmHg (02/08 1604) Pulse Rate: 72 (02/08 1604) Intake/Output from previous day:   Intake/Output from this shift:    Labs:  Recent Labs  04/30/14 1455  WBC 4.4  HGB 14.1  PLT 224  CREATININE 1.22   Estimated Creatinine Clearance: 60.7 mL/min (by C-G formula based on Cr of 1.22). No results for input(s): VANCOTROUGH, VANCOPEAK, VANCORANDOM, GENTTROUGH, GENTPEAK, GENTRANDOM, TOBRATROUGH, TOBRAPEAK, TOBRARND, AMIKACINPEAK, AMIKACINTROU, AMIKACIN in the last 72 hours.   Microbiology: No results found for this or any previous visit (from the past 720 hour(s)).  Medical History: Past Medical History  Diagnosis Date  . Hypertension   . HIV positive   . Hepatitis C, chronic     Medications:   (Not in a hospital admission)   Assessment: 64 yo M presents on 2/8 with left foot swelling and discomfort. This started over a week ago and became worse within the last 2-3 days. PMH includes HTN, HIV, and Hep C. Pharmacy to dose vancomycin for cellulitis. Given vancomycin 1g IV x 1 in the ED. SCr is 1.22, CrCl ~5460ml/min. Afebrile and WBC wnl.  Goal of Therapy:  Vancomycin trough level 10-15 mcg/ml  Resolution of infection  Plan:  Start 750mg  IV Q12 tomorrow at 0500 Monitor renal function, clinical picture, VT at Css  Armandina StammerBATCHELDER,[place] Lasseter J 04/30/2014,4:34 PM

## 2014-04-30 NOTE — ED Notes (Signed)
Was observed to walk w slight limp to exam room. States his foot has been infected x 1 week, but worse since Saturday. Foot malodorous, draining, open lesions between toes

## 2014-05-01 DIAGNOSIS — B182 Chronic viral hepatitis C: Secondary | ICD-10-CM

## 2014-05-01 DIAGNOSIS — E785 Hyperlipidemia, unspecified: Secondary | ICD-10-CM

## 2014-05-01 DIAGNOSIS — I10 Essential (primary) hypertension: Secondary | ICD-10-CM

## 2014-05-01 LAB — COMPREHENSIVE METABOLIC PANEL
ALBUMIN: 2.9 g/dL — AB (ref 3.5–5.2)
ALT: 43 U/L (ref 0–53)
ANION GAP: 4 — AB (ref 5–15)
AST: 35 U/L (ref 0–37)
Alkaline Phosphatase: 65 U/L (ref 39–117)
BUN: 17 mg/dL (ref 6–23)
CO2: 24 mmol/L (ref 19–32)
Calcium: 8.2 mg/dL — ABNORMAL LOW (ref 8.4–10.5)
Chloride: 111 mmol/L (ref 96–112)
Creatinine, Ser: 1.29 mg/dL (ref 0.50–1.35)
GFR calc Af Amer: 67 mL/min — ABNORMAL LOW (ref >90)
GFR calc non Af Amer: 58 mL/min — ABNORMAL LOW (ref >90)
Glucose, Bld: 136 mg/dL — ABNORMAL HIGH (ref 70–99)
Potassium: 3.4 mmol/L — ABNORMAL LOW (ref 3.5–5.1)
SODIUM: 139 mmol/L (ref 135–145)
TOTAL PROTEIN: 6 g/dL (ref 6.0–8.3)
Total Bilirubin: 0.7 mg/dL (ref 0.3–1.2)

## 2014-05-01 LAB — CBC
HEMATOCRIT: 39.3 % (ref 39.0–52.0)
Hemoglobin: 13.3 g/dL (ref 13.0–17.0)
MCH: 30.2 pg (ref 26.0–34.0)
MCHC: 33.8 g/dL (ref 30.0–36.0)
MCV: 89.3 fL (ref 78.0–100.0)
PLATELETS: 223 10*3/uL (ref 150–400)
RBC: 4.4 MIL/uL (ref 4.22–5.81)
RDW: 13.2 % (ref 11.5–15.5)
WBC: 4 10*3/uL (ref 4.0–10.5)

## 2014-05-01 LAB — PROTIME-INR
INR: 1.09 (ref 0.00–1.49)
PROTHROMBIN TIME: 14.2 s (ref 11.6–15.2)

## 2014-05-01 LAB — HEMOGLOBIN A1C
Hgb A1c MFr Bld: 6.1 % — ABNORMAL HIGH (ref 4.8–5.6)
Mean Plasma Glucose: 128 mg/dL

## 2014-05-01 LAB — APTT: APTT: 35 s (ref 24–37)

## 2014-05-01 MED ORDER — DIPHENHYDRAMINE HCL 25 MG PO CAPS: 25.0000 mg | ORAL_CAPSULE | Freq: Four times a day (QID) | ORAL | Status: DC | PRN

## 2014-05-01 MED ORDER — POTASSIUM CHLORIDE CRYS ER 20 MEQ PO TBCR
40.0000 meq | EXTENDED_RELEASE_TABLET | Freq: Once | ORAL | Status: AC
  Administered 2014-05-01: 40 meq via ORAL
  Filled 2014-05-01: qty 2

## 2014-05-01 NOTE — Evaluation (Signed)
Physical Therapy Evaluation Patient Details Name: Jamie MessingWalter Scialdone MRN: [id number] DOB: 09/17/1950 Today's Date: 05/01/2014   History of Present Illness  Patient is a 64 y/o male with PMH of HIV positive, Hepatitis C, hypertension and dyslipidemia who presents with swelling, redness, foul smell and pain of left foot. Admitted with cellulitis and abscess of left foot.     Clinical Impression  Patient presents with pain, generalized weakness and balance deficits impacting safe mobility. Balance improved with use of RW for safety/stability. Education provided on shoe wear, adjusting standing times/job roles at work for comfort/pain control and overall concerns regarding mobility and left foot abscess. Pt would benefit from skilled PT to improve gait, balance and mobility so pt can maximize independence and return to PLOF.    Follow Up Recommendations No PT follow up;Supervision - Intermittent    Equipment Recommendations  Rolling walker with 5" wheels    Recommendations for Other Services       Precautions / Restrictions Precautions Precautions: Fall Required Braces or Orthoses: Other Brace/Splint Other Brace/Splint: post op shoe LLE. Restrictions Weight Bearing Restrictions: No      Mobility  Bed Mobility Overal bed mobility: Needs Assistance Bed Mobility: Supine to Sit     Supine to sit: Modified independent (Device/Increase time);HOB elevated        Transfers Overall transfer level: Needs assistance Equipment used: None Transfers: Sit to/from Stand Sit to Stand: Min guard         General transfer comment: Min guard for safety. Stood from [employer] EnergyEOB x2.  Ambulation/Gait Ambulation/Gait assistance: Min guard;Min assist Ambulation Distance (Feet): 75 Feet (+40') Assistive device: Rolling walker (2 wheeled);None Gait Pattern/deviations: Decreased stance time - left;Decreased step length - right;Step-through pattern;Decreased stride length   Gait velocity interpretation:  Below normal speed for age/gender General Gait Details: Ambulated w/out RW- unsteady with increased sway and drifting noted. Ambulated with RW and balance improved. 1 instance of partial knee buckling LLE.  Stairs            Wheelchair Mobility    Modified Rankin (Stroke Patients Only)       Balance Overall balance assessment: Needs assistance Sitting-balance support: Feet supported;No upper extremity supported Sitting balance-Leahy Scale: Good Sitting balance - Comments: Able to donn sock and boot sitting EOB.    Standing balance support: During functional activity Standing balance-Leahy Scale: Fair Standing balance comment: Tolerated standing during urination without difficulty and no UE support however unsteady futing dynamic standing requiring UE support.                             Pertinent Vitals/Pain Pain Assessment: Faces Faces Pain Scale: Hurts little more Pain Location: left foot Pain Descriptors / Indicators: Sore;Aching Pain Intervention(s): Monitored during session;Repositioned    Home Living Family/patient expects to be discharged to:: Private residence Living Arrangements: Alone   Type of Home: Apartment Home Access: Elevator     Home Layout: One level Home Equipment: None      Prior Function Level of Independence: Independent               Hand Dominance        Extremity/Trunk Assessment   Upper Extremity Assessment: Defer to OT evaluation           Lower Extremity Assessment: Generalized weakness      Cervical / Trunk Assessment: Normal  Communication   Communication: No difficulties  Cognition Arousal/Alertness: Awake/alert Behavior During Therapy: [facility]WFL for  tasks assessed/performed Overall Cognitive Status: Within Functional Limits for tasks assessed                      General Comments General comments (skin integrity, edema, etc.): Answered lots of questions regarding healing time, need for post  op boot, recommendations for work etc.     Exercises        Assessment/Plan    PT Assessment Patient needs continued PT services  PT Diagnosis Difficulty walking;Generalized weakness;Acute pain   PT Problem List Decreased strength;Pain;Decreased activity tolerance;Decreased balance;Decreased mobility;Decreased safety awareness  PT Treatment Interventions Balance training;Gait training;Patient/family education;Functional mobility training;Therapeutic activities;Therapeutic exercise;DME instruction   PT Goals (Current goals can be found in the Care Plan section) Acute Rehab PT Goals Patient Stated Goal: to be able to return to working at [employer] PT Goal Formulation: With patient Time For Goal Achievement: 05/15/14 Potential to Achieve Goals: Fair    Frequency Min 3X/week   Barriers to discharge Decreased caregiver support      Co-evaluation               End of Session Equipment Utilized During Treatment: Gait belt;Other (comment) (post op shoe) Activity Tolerance: Patient tolerated treatment well Patient left: in chair;with call bell/phone within reach (Encouraged pt to ask for assist for mobility.) Nurse Communication: Mobility status         Time: 1000-1023 PT Time Calculation (min) (ACUTE ONLY): 23 min   Charges:   PT Evaluation $Initial PT Evaluation Tier I: 1 Procedure PT Treatments $Self Care/Home Management: 8-22   PT G CodesAlvie Heidelberg A May 29, 2014, 10:33 AM Alvie Heidelberg, PT, DPT [phone]

## 2014-05-01 NOTE — Consult Note (Signed)
Reason for Consult:  Left foot infection Referring Physician: [facility]  Jamie Clark is an 64 y.o. male.  HPI:   64 yo male with a 1 week history of increasing left foot swelling, redness, and foul smell.  Came to the ED yesterday for further eval and tx.  He states that this has happened before.  He does have a history of being immunocompromised in the past.  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  Past Medical History  Diagnosis Date  . Hypertension   . HIV positive   . Hepatitis C, chronic     History reviewed. No pertinent past surgical history.  No family history on file.  Social History:  reports that he has been smoking Cigarettes.  He has been smoking about 0.25 packs per day. He has never used smokeless tobacco. He reports that he does not drink alcohol or use illicit drugs.  Allergies: No Known Allergies  Medications: I have reviewed the patient's current medications.  Results for orders placed or performed during the hospital encounter of 04/30/14 (from the past 48 hour(s))  CBC with Differential     Status: None   Collection Time: 04/30/14  2:55 PM  Result Value Ref Range   WBC 4.4 4.0 - 10.5 K/uL   RBC 4.63 4.22 - 5.81 MIL/uL   Hemoglobin 14.1 13.0 - 17.0 g/dL   HCT 41.2 39.0 - 52.0 %   MCV 89.0 78.0 - 100.0 fL   MCH 30.5 26.0 - 34.0 pg   MCHC 34.2 30.0 - 36.0 g/dL   RDW 13.2 11.5 - 15.5 %   Platelets 224 150 - 400 K/uL   Neutrophils Relative % 46 43 - 77 %   Neutro Abs 2.0 1.7 - 7.7 K/uL   Lymphocytes Relative 40 12 - 46 %   Lymphs Abs 1.8 0.7 - 4.0 K/uL   Monocytes Relative 9 3 - 12 %   Monocytes Absolute 0.4 0.1 - 1.0 K/uL   Eosinophils Relative 5 0 - 5 %   Eosinophils Absolute 0.2 0.0 - 0.7 K/uL   Basophils Relative 0 0 - 1 %   Basophils Absolute 0.0 0.0 - 0.1 K/uL  Comprehensive metabolic panel     Status: Abnormal   Collection Time: 04/30/14   2:55 PM  Result Value Ref Range   Sodium 137 135 - 145 mmol/L   Potassium 3.7 3.5 - 5.1 mmol/L   Chloride 109 96 - 112 mmol/L   CO2 22 19 - 32 mmol/L   Glucose, Bld 120 (H) 70 - 99 mg/dL   BUN 23 6 - 23 mg/dL   Creatinine, Ser 1.22 0.50 - 1.35 mg/dL   Calcium 8.6 8.4 - 10.5 mg/dL   Total Protein 7.3 6.0 - 8.3 g/dL   Albumin 3.4 (L) 3.5 - 5.2 g/dL   AST 44 (H) 0 - 37 U/L   ALT 52 0 - 53 U/L   Alkaline Phosphatase 75 39 - 117 U/L   Total Bilirubin 0.6 0.3 - 1.2 mg/dL   GFR calc non Af Amer 62 (L) >90 mL/min   GFR calc Af Amer 72 (L) >90 mL/min    Comment: (NOTE) The eGFR has been calculated using the CKD EPI equation. This calculation has not been validated in all clinical situations. eGFR's persistently <90 mL/min signify possible Chronic Kidney Disease.    Anion gap 6 5 -  15  Hemoglobin A1c     Status: Abnormal   Collection Time: 04/30/14  2:55 PM  Result Value Ref Range   Hgb A1c MFr Bld 6.1 (H) 4.8 - 5.6 %    Comment: (NOTE)         Pre-diabetes: 5.7 - 6.4         Diabetes: >6.4         Glycemic control for adults with diabetes: <7.0    Mean Plasma Glucose 128 mg/dL    Comment: (NOTE) Performed At: [facility] [place], [place] [id number] Lindon Romp MD [id number]   Comprehensive metabolic panel     Status: Abnormal   Collection Time: 05/01/14  4:56 AM  Result Value Ref Range   Sodium 139 135 - 145 mmol/L   Potassium 3.4 (L) 3.5 - 5.1 mmol/L   Chloride 111 96 - 112 mmol/L   CO2 24 19 - 32 mmol/L   Glucose, Bld 136 (H) 70 - 99 mg/dL   BUN 17 6 - 23 mg/dL   Creatinine, Ser 1.29 0.50 - 1.35 mg/dL   Calcium 8.2 (L) 8.4 - 10.5 mg/dL   Total Protein 6.0 6.0 - 8.3 g/dL   Albumin 2.9 (L) 3.5 - 5.2 g/dL   AST 35 0 - 37 U/L   ALT 43 0 - 53 U/L   Alkaline Phosphatase 65 39 - 117 U/L   Total Bilirubin 0.7 0.3 - 1.2 mg/dL   GFR calc non Af Amer 58 (L) >90 mL/min   GFR calc Af Amer 67 (L) >90 mL/min    Comment: (NOTE) The eGFR has been  calculated using the CKD EPI equation. This calculation has not been validated in all clinical situations. eGFR's persistently <90 mL/min signify possible Chronic Kidney Disease.    Anion gap 4 (L) 5 - 15  CBC     Status: None   Collection Time: 05/01/14  4:56 AM  Result Value Ref Range   WBC 4.0 4.0 - 10.5 K/uL   RBC 4.40 4.22 - 5.81 MIL/uL   Hemoglobin 13.3 13.0 - 17.0 g/dL   HCT 39.3 39.0 - 52.0 %   MCV 89.3 78.0 - 100.0 fL   MCH 30.2 26.0 - 34.0 pg   MCHC 33.8 30.0 - 36.0 g/dL   RDW 13.2 11.5 - 15.5 %   Platelets 223 150 - 400 K/uL  Protime-INR     Status: None   Collection Time: 05/01/14  4:56 AM  Result Value Ref Range   Prothrombin Time 14.2 11.6 - 15.2 seconds   INR 1.09 0.00 - 1.49  APTT     Status: None   Collection Time: 05/01/14  4:56 AM  Result Value Ref Range   aPTT 35 24 - 37 seconds    Dg Chest 2 View  04/30/2014   CLINICAL DATA:  Shortness of breath.  Hypertension.  Tobacco use.  EXAM: CHEST  2 VIEW  COMPARISON:  04/02/2011  FINDINGS: Mild scarring in the left lower lobe. The lungs appear otherwise clear. Cardiac and mediastinal margins appear normal.  No pleural effusion noted.  IMPRESSION: 1. Mild scarring in the left lower lobe, otherwise negative.   Electronically Signed   By: Sherryl Barters M.D.   On: 04/30/2014 18:04   Mr Foot Left Wo Contrast  04/30/2014   CLINICAL DATA:  Cellulitis and abscess of the left foot with pain.  EXAM: MRI OF THE LEFT FOOT WITHOUT CONTRAST  TECHNIQUE: Multiplanar, multisequence MR imaging was  performed. No intravenous contrast was administered. We performed are osteomyelitis protocol, which includes the entire foot for infection screening purposes but which does not sensitively assess for smaller structures such as ligaments.  COMPARISON:  04/30/2014.  FINDINGS: Presumably due to difficulty positioning the patient, on various imaging sequences the fifth metatarsophalangeal joint and parts of the fifth toe were excluded. These areas  are included on the images marked "Sagittal" but not on the coronal or axial sequences. No osseous edema observed in the forefoot, midfoot, or hindfoot to favor osteomyelitis.  No abnormal osseous edema is present in the forefoot, hindfoot, or midfoot to favor osteomyelitis.  Dorsal subcutaneous edema is present in the foot and tracks back to the ankle overlying the malleoli.  There is some mild thickening of the medial band of the plantar fascia at its proximal attachment site along the plantar calcaneal spur.  A well-defined abscess is not identified.  IMPRESSION: 1. Dorsal subcutaneous edema in the foot tracking back over the malleoli. This could well reflect cellulitis but I do not see a definite abscess or findings of osteomyelitis. 2. Mild plantar fasciitis.   Electronically Signed   By: Sherryl Barters M.D.   On: 04/30/2014 21:12   Dg Foot Complete Left  04/30/2014   CLINICAL DATA:  Left foot pain for 1 week appearing a  EXAM: LEFT FOOT - COMPLETE 3+ VIEW  COMPARISON:  None  FINDINGS: Along the proximal lateral corner of proximal phalanx of the first digit there is a cortical erosion seen on the oblique projection. No additional cortical erosions are evident in the midfoot or forefoot. Swelling in the forefoot.  IMPRESSION: Cortical erosion at the base of the proximal phalanx of the first digit with differential diagnosis of osteomyelitis versus gout.   Electronically Signed   By: Suzy Bouchard M.D.   On: 04/30/2014 18:07    ROS Blood pressure 116/68, pulse 80, temperature 98.7 F (37.1 C), temperature source Oral, resp. rate 16, height 5' 8"  (1.727 m), weight 77.111 kg (170 lb), SpO2 94 %. Physical Exam  Musculoskeletal:       Feet:   MRI consistent with cellulitis.  No focal abscess.  No bone destruction/no evidence of osteo   Assessment/Plan: Left foot cellulitis 1)  He needs strict elevation of his left foot over the next 1-2 days to help with swelling.  He also needs to keep his  foot completely dry especially between his toes.  Continue IV antibiotics to treat his cellulitis over the next 24 to 48 hours.  No surgical indication thus far.  I'll continue to follow daily.  Jennice Renegar Y 05/01/2014, 7:15 AM

## 2014-05-01 NOTE — Progress Notes (Signed)
Patient ID: Jamie Clark  male  GNF:[id number]    DOB: 09/15/1950    DOA: 04/30/2014  PCP: Dalbert MayotteSNIDER, ALISON, MD  Brief history of present illness  Jamie Clark is a pleasant 64 y.o. male, with history of HIV positive status [facility], Hepatitis C positive, hypertension and dyslipidemia. He presented to the ER with complaints of left foot pain and swelling. He reported that the symptoms began about one week prior. Because of increasing pain, a sensation of fever in the leg and foul smell he presented to the emergency room for further evaluation. Patient did not have any leukocytosis or fever. Unfortunately the foot appeared  infected. Blood cultures were obtained and patient was given a dose of Cleocin in the ER. Patient also reported new rash on the legs and arms since the onset of the problem with the foot. He stated he has previously been treated for likely tinea pedis/yeast and possible early cellulitis with oral medications without any improvement in symptoms.    Assessment/Plan: Principal Problem:   Cellulitis and abscess of left foot - X-ray of the left foot showed cortical erosion at the base of the proximal phalanx of the first digit with differential diagnosis of osteomyelitis versus Gout - MRI of the foot showed dorsal subcutaneous edema in the foot tracking back over the malleoli, no definitive abscess or osteomyelitis, mild plantar fasciitis - Orthopedics consulted, patient was seen by Dr. Magnus IvanBlackman who recommended IV antibiotics for 24-48 hours, elevate left foot  Active Problems:   HIV infection - Last CD4 count in April 2015 greater than 900, continue antiretroviral medications    Chronic viral hepatitis C - Total bilirubin normal, mild elevation in AST at 44  Acute renal failure - Likely due to #1, cellulitis and pain - Hold ACE inhibitor, thiazide, creatinine currently stable    Dyslipidemia - Not on medications prior to admission    HTN  (hypertension) - BP controlled, continue Norvasc    Hyperglycemia Obtain hemoglobin A1c   DVT Prophylaxis: Heparin subcutaneous  Code Status: Full code  Family Communication:  Disposition:  Consultants:  Orthopedics  Procedures:  MRI of the left foot  Antibiotics:  IV vancomycin  Subjective: Patient seen and examined, no specific complaints at this time, no fevers or chills  Objective: Weight change:   Intake/Output Summary (Last 24 hours) at 05/01/14 1308 Last data filed at 05/01/14 0700  Gross per 24 hour  Intake   1255 ml  Output    425 ml  Net    830 ml   Blood pressure 116/68, pulse 80, temperature 98.7 F (37.1 C), temperature source Oral, resp. rate 16, height 5\' 8"  (1.727 m), weight 77.111 kg (170 lb), SpO2 94 %.  Physical Exam: General: Alert and awake, oriented x3, not in any acute distress. CVS: S1-S2 clear, no murmur rubs or gallops Chest: clear to auscultation bilaterally, no wheezing, rales or rhonchi Abdomen: soft nontender, nondistended, normal bowel sounds  Extremities: left foot dressing intact   Lab Results: Basic Metabolic Panel:  Recent Labs Lab 04/30/14 1455 05/01/14 0456  NA 137 139  K 3.7 3.4*  CL 109 111  CO2 22 24  GLUCOSE 120* 136*  BUN 23 17  CREATININE 1.22 1.29  CALCIUM 8.6 8.2*   Liver Function Tests:  Recent Labs Lab 04/30/14 1455 05/01/14 0456  AST 44* 35  ALT 52 43  ALKPHOS 75 65  BILITOT 0.6 0.7  PROT 7.3 6.0  ALBUMIN 3.4* 2.9*  No results for input(s): LIPASE, AMYLASE in the last 168 hours. No results for input(s): AMMONIA in the last 168 hours. CBC:  Recent Labs Lab 04/30/14 1455 05/01/14 0456  WBC 4.4 4.0  NEUTROABS 2.0  --   HGB 14.1 13.3  HCT 41.2 39.3  MCV 89.0 89.3  PLT 224 223   Cardiac Enzymes: No results for input(s): CKTOTAL, CKMB, CKMBINDEX, TROPONINI in the last 168 hours. BNP: Invalid input(s): POCBNP CBG: No results for input(s): GLUCAP in the last 168  hours.   Micro Results: Recent Results (from the past 240 hour(s))  Blood culture (routine x 2)     Status: None (Preliminary result)   Collection Time: 04/30/14  2:50 PM  Result Value Ref Range Status   Specimen Description BLOOD LEFT ARM  Final   Special Requests BOTTLES DRAWN AEROBIC AND ANAEROBIC 5CC  Final   Culture   Final           BLOOD CULTURE RECEIVED NO GROWTH TO DATE CULTURE WILL BE HELD FOR 5 DAYS BEFORE ISSUING A FINAL NEGATIVE REPORT Performed at [facility]    Report Status PENDING  Incomplete  Blood culture (routine x 2)     Status: None (Preliminary result)   Collection Time: 04/30/14  2:55 PM  Result Value Ref Range Status   Specimen Description BLOOD RIGHT HAND  Final   Special Requests BOTTLES DRAWN AEROBIC AND ANAEROBIC 5CC  Final   Culture   Final           BLOOD CULTURE RECEIVED NO GROWTH TO DATE CULTURE WILL BE HELD FOR 5 DAYS BEFORE ISSUING A FINAL NEGATIVE REPORT Performed at [facility]    Report Status PENDING  Incomplete    Studies/Results: Dg Chest 2 View  04/30/2014   CLINICAL DATA:  Shortness of breath.  Hypertension.  Tobacco use.  EXAM: CHEST  2 VIEW  COMPARISON:  04/02/2011  FINDINGS: Mild scarring in the left lower lobe. The lungs appear otherwise clear. Cardiac and mediastinal margins appear normal.  No pleural effusion noted.  IMPRESSION: 1. Mild scarring in the left lower lobe, otherwise negative.   Electronically Signed   By: Herbie Baltimore M.D.   On: 04/30/2014 18:04   Mr Foot Left Wo Contrast  04/30/2014   CLINICAL DATA:  Cellulitis and abscess of the left foot with pain.  EXAM: MRI OF THE LEFT FOOT WITHOUT CONTRAST  TECHNIQUE: Multiplanar, multisequence MR imaging was performed. No intravenous contrast was administered. We performed are osteomyelitis protocol, which includes the entire foot for infection screening purposes but which does not sensitively assess for smaller structures such as ligaments.  COMPARISON:   04/30/2014.  FINDINGS: Presumably due to difficulty positioning the patient, on various imaging sequences the fifth metatarsophalangeal joint and parts of the fifth toe were excluded. These areas are included on the images marked "Sagittal" but not on the coronal or axial sequences. No osseous edema observed in the forefoot, midfoot, or hindfoot to favor osteomyelitis.  No abnormal osseous edema is present in the forefoot, hindfoot, or midfoot to favor osteomyelitis.  Dorsal subcutaneous edema is present in the foot and tracks back to the ankle overlying the malleoli.  There is some mild thickening of the medial band of the plantar fascia at its proximal attachment site along the plantar calcaneal spur.  A well-defined abscess is not identified.  IMPRESSION: 1. Dorsal subcutaneous edema in the foot tracking back over the malleoli. This could well reflect cellulitis but I do  not see a definite abscess or findings of osteomyelitis. 2. Mild plantar fasciitis.   Electronically Signed   By: Herbie Baltimore M.D.   On: 04/30/2014 21:12   Dg Foot Complete Left  04/30/2014   CLINICAL DATA:  Left foot pain for 1 week appearing a  EXAM: LEFT FOOT - COMPLETE 3+ VIEW  COMPARISON:  None  FINDINGS: Along the proximal lateral corner of proximal phalanx of the first digit there is a cortical erosion seen on the oblique projection. No additional cortical erosions are evident in the midfoot or forefoot. Swelling in the forefoot.  IMPRESSION: Cortical erosion at the base of the proximal phalanx of the first digit with differential diagnosis of osteomyelitis versus gout.   Electronically Signed   By: Genevive Bi M.D.   On: 04/30/2014 18:07    Medications: Scheduled Meds: . abacavir  600 mg Oral Daily   And  . dolutegravir  50 mg Oral Daily   And  . lamiVUDine  300 mg Oral Daily  . amLODipine  10 mg Oral QHS  . docusate sodium  100 mg Oral BID  . heparin  5,000 Units Subcutaneous 3 times per day  . vancomycin  750 mg  Intravenous Q12H   Time spent 25 minutes   LOS: 1 day   Rushawn Capshaw M.D. [facility] 05/01/2014, 1:08 PM Pager: [phone]  If 7PM-7AM, please contact night-coverage [email or web address] Password [email or web address]

## 2014-05-01 NOTE — Progress Notes (Signed)
Orthopedic Tech Progress Note Patient Details:  Jamie Clark 06/16/1950 [id number]  Ortho Devices Type of Ortho Device: Postop shoe/boot Ortho Device/Splint Location: LLE Ortho Device/Splint Interventions: Application   Asia R Thompson 05/01/2014, 7:55 AM

## 2014-05-02 LAB — T-HELPER CELLS (CD4) COUNT (NOT AT ARMC)
CD4 T CELL ABS: 880 /uL (ref 400–2700)
CD4 T CELL HELPER: 43 % (ref 33–55)

## 2014-05-02 NOTE — Progress Notes (Signed)
Patient ID: Jamie Clark  male  ZOX:[id number]    DOB: 25-Jan-1951    DOA: 04/30/2014  PCP: Dalbert Mayotte, MD  Brief history of present illness  Jamie Clark is a pleasant 64 y.o. male, with history of HIV positive status followed at the [facility] ID clinic, Hepatitis C positive, hypertension and dyslipidemia. He presented to the ER with complaints of left foot pain and swelling. He reported that the symptoms began about one week prior. Because of increasing pain, a sensation of fever in the leg and foul smell he presented to the emergency room for further evaluation. Patient did not have any leukocytosis or fever. Unfortunately the foot appeared  infected. Blood cultures were obtained and patient was given a dose of Cleocin in the ER. Patient also reported new rash on the legs and arms since the onset of the problem with the foot. He stated he has previously been treated for likely tinea pedis/yeast and possible early cellulitis with oral medications without any improvement in symptoms.    Assessment/Plan: Principal Problem:   Cellulitis and abscess of left foot - X-ray of the left foot showed cortical erosion at the base of the proximal phalanx of the first digit with differential diagnosis of osteomyelitis versus Gout - MRI of the foot showed dorsal subcutaneous edema in the foot tracking back over the malleoli, no definitive abscess or osteomyelitis, mild plantar fasciitis - Orthopedics consulted, continue IV antibiotics, vancomycin, hopefully DC home tomorrow  Active Problems:   HIV infection - Last CD4 count in April 2015 greater than 900, continue antiretroviral medications    Chronic viral hepatitis C - Total bilirubin normal, mild elevation in AST at 44  Acute renal failure - Likely due to #1, cellulitis and pain, holding HCTZ, ACE inhibitor    Dyslipidemia - Not on medications prior to admission    HTN (hypertension) - BP controlled, continue Norvasc   Hyperglycehemoglobin A1c 6.1  DVT Prophylaxis: Heparin subcutaneous  Code Status: Full code  Family Communication: discussed with girlfriend at the bedside, patient gave permission to discuss   Disposition:  Consultants:  Orthopedics  Procedures:  MRI of the left foot  Antibiotics:  IV vancomycin  Subjective: Patient seen and examined states that wound is improving, dressing done this morning by Dr. Magnus Ivan  girlfriend at the bedside, once home health nurse for the dressing changes when discharged   objective: Weight change:   Intake/Output Summary (Last 24 hours) at 05/02/14 1325 Last data filed at 05/02/14 1130  Gross per 24 hour  Intake    720 ml  Output   1150 ml  Net   -430 ml   Blood pressure 128/77, pulse 71, temperature 98.3 F (36.8 C), temperature source Oral, resp. rate 18, height  (1.727 m), weight 77.111 kg (170 lb), SpO2 97 %.  Physical Exam: General: Alert and awake, oriented x3, not in any acute distress. CVS: S1-S2 clear, no murmur rubs or gallops Chest: clear to auscultation bilaterally, no wheezing, rales or rhonchi Abdomen: soft nontender, nondistended, normal bowel sounds  Extremities: left foot dressing intact   Lab Results: Basic Metabolic Panel:  Recent Labs Lab 04/30/14 1455 05/01/14 0456  NA 137 139  K 3.7 3.4*  CL 109 111  CO2 22 24  GLUCOSE 120* 136*  BUN 23 17  CREATININE 1.22 1.29  CALCIUM 8.6 8.2*   Liver Function Tests:  Recent Labs Lab 04/30/14 1455 05/01/14 0456  AST 44* 35  ALT 52 43  ALKPHOS 75 65  BILITOT 0.6 0.7  PROT 7.3 6.0  ALBUMIN 3.4* 2.9*   No results for input(s): LIPASE, AMYLASE in the last 168 hours. No results for input(s): AMMONIA in the last 168 hours. CBC:  Recent Labs Lab 04/30/14 1455 05/01/14 0456  WBC 4.4 4.0  NEUTROABS 2.0  --   HGB 14.1 13.3  HCT 41.2 39.3  MCV 89.0 89.3  PLT 224 223   Cardiac Enzymes: No results for input(s): CKTOTAL, CKMB, CKMBINDEX, TROPONINI  in the last 168 hours. BNP: Invalid input(s): POCBNP CBG: No results for input(s): GLUCAP in the last 168 hours.   Micro Results: Recent Results (from the past 240 hour(s))  Blood culture (routine x 2)     Status: None (Preliminary result)   Collection Time: 04/30/14  2:50 PM  Result Value Ref Range Status   Specimen Description BLOOD LEFT ARM  Final   Special Requests BOTTLES DRAWN AEROBIC AND ANAEROBIC 5CC  Final   Culture   Final           BLOOD CULTURE RECEIVED NO GROWTH TO DATE CULTURE WILL BE HELD FOR 5 DAYS BEFORE ISSUING A FINAL NEGATIVE REPORT Performed at [facility]    Report Status PENDING  Incomplete  Blood culture (routine x 2)     Status: None (Preliminary result)   Collection Time: 04/30/14  2:55 PM  Result Value Ref Range Status   Specimen Description BLOOD RIGHT HAND  Final   Special Requests BOTTLES DRAWN AEROBIC AND ANAEROBIC 5CC  Final   Culture   Final           BLOOD CULTURE RECEIVED NO GROWTH TO DATE CULTURE WILL BE HELD FOR 5 DAYS BEFORE ISSUING A FINAL NEGATIVE REPORT Performed at [facility]    Report Status PENDING  Incomplete    Studies/Results: Dg Chest 2 View  04/30/2014   CLINICAL DATA:  Shortness of breath.  Hypertension.  Tobacco use.  EXAM: CHEST  2 VIEW  COMPARISON:  04/02/2011  FINDINGS: Mild scarring in the left lower lobe. The lungs appear otherwise clear. Cardiac and mediastinal margins appear normal.  No pleural effusion noted.  IMPRESSION: 1. Mild scarring in the left lower lobe, otherwise negative.   Electronically Signed   By: Herbie Baltimore M.D.   On: 04/30/2014 18:04   Mr Foot Left Wo Contrast  04/30/2014   CLINICAL DATA:  Cellulitis and abscess of the left foot with pain.  EXAM: MRI OF THE LEFT FOOT WITHOUT CONTRAST  TECHNIQUE: Multiplanar, multisequence MR imaging was performed. No intravenous contrast was administered. We performed are osteomyelitis protocol, which includes the entire foot for infection screening  purposes but which does not sensitively assess for smaller structures such as ligaments.  COMPARISON:  04/30/2014.  FINDINGS: Presumably due to difficulty positioning the patient, on various imaging sequences the fifth metatarsophalangeal joint and parts of the fifth toe were excluded. These areas are included on the images marked "Sagittal" but not on the coronal or axial sequences. No osseous edema observed in the forefoot, midfoot, or hindfoot to favor osteomyelitis.  No abnormal osseous edema is present in the forefoot, hindfoot, or midfoot to favor osteomyelitis.  Dorsal subcutaneous edema is present in the foot and tracks back to the ankle overlying the malleoli.  There is some mild thickening of the medial band of the plantar fascia at its proximal attachment site along the plantar calcaneal spur.  A well-defined abscess is not identified.  IMPRESSION: 1. Dorsal subcutaneous edema in the foot  tracking back over the malleoli. This could well reflect cellulitis but I do not see a definite abscess or findings of osteomyelitis. 2. Mild plantar fasciitis.   Electronically Signed   By: Herbie BaltimoreWalt  Liebkemann M.D.   On: 04/30/2014 21:12   Dg Foot Complete Left  04/30/2014   CLINICAL DATA:  Left foot pain for 1 week appearing a  EXAM: LEFT FOOT - COMPLETE 3+ VIEW  COMPARISON:  None  FINDINGS: Along the proximal lateral corner of proximal phalanx of the first digit there is a cortical erosion seen on the oblique projection. No additional cortical erosions are evident in the midfoot or forefoot. Swelling in the forefoot.  IMPRESSION: Cortical erosion at the base of the proximal phalanx of the first digit with differential diagnosis of osteomyelitis versus gout.   Electronically Signed   By: Genevive BiStewart  Edmunds M.D.   On: 04/30/2014 18:07    Medications: Scheduled Meds: . abacavir  600 mg Oral Daily   And  . dolutegravir  50 mg Oral Daily   And  . lamiVUDine  300 mg Oral Daily  . amLODipine  10 mg Oral QHS  .  docusate sodium  100 mg Oral BID  . heparin  5,000 Units Subcutaneous 3 times per day  . vancomycin  750 mg Intravenous Q12H   Time spent 25 minutes   LOS: 2 days   RAI,RIPUDEEP M.D. [facility] 05/02/2014, 1:25 PM Pager: [phone]  If 7PM-7AM, please contact night-coverage [email or web address] Password [email or web address]

## 2014-05-02 NOTE — Progress Notes (Signed)
Physical Therapy Treatment Patient Details Name: Sindy MessingWalter Full MRN: [id number] DOB: 01/16/1951 Today's Date: 05/02/2014    History of Present Illness Patient is a 64 y/o male with PMH of HIV positive, Hepatitis C, hypertension and dyslipidemia who presents with swelling, redness, foul smell and pain of left foot. Admitted with cellulitis and abscess of left foot.     PT Comments    Patient progressing well with mobility. Balance improved from prior session with no instances of partial knee buckling. Able to donn/doff post op shoe without assist. Understands importance of wearing post op shoe at all times when up walking. Recommend use of RW for pain control, energy conservation and balance for short period of time for safety. Pt agreeable. Will continue to follow and progress as tolerated.   Follow Up Recommendations  No PT follow up;Supervision - Intermittent     Equipment Recommendations  Rolling walker with 5" wheels    Recommendations for Other Services       Precautions / Restrictions Precautions Precautions: Fall Required Braces or Orthoses: Other Brace/Splint Other Brace/Splint: post op shoe LLE. Restrictions Weight Bearing Restrictions: No    Mobility  Bed Mobility Overal bed mobility: Modified Independent Bed Mobility: Supine to Sit;Sit to Supine     Supine to sit: Modified independent (Device/Increase time);HOB elevated Sit to supine: Modified independent (Device/Increase time);HOB elevated      Transfers Overall transfer level: Needs assistance Equipment used: None Transfers: Sit to/from Stand Sit to Stand: Supervision         General transfer comment: Supervision for safety.   Ambulation/Gait Ambulation/Gait assistance: Supervision Ambulation Distance (Feet): 125 Feet Assistive device: Rolling walker (2 wheeled) Gait Pattern/deviations: Step-through pattern;Decreased stride length;Decreased stance time - left;Trunk flexed     General Gait  Details: More normalized gait pattern today with cues to increase step length bil. Use of RW for balance/pain control.   Stairs            Wheelchair Mobility    Modified Rankin (Stroke Patients Only)       Balance Overall balance assessment: Needs assistance Sitting-balance support: Feet supported;No upper extremity supported Sitting balance-Leahy Scale: Good Sitting balance - Comments: Able to donn sock and boot sitting EOB. Increased time.   Standing balance support: During functional activity Standing balance-Leahy Scale: Fair                      Cognition Arousal/Alertness: Awake/alert Behavior During Therapy: WFL for tasks assessed/performed Overall Cognitive Status: Within Functional Limits for tasks assessed       Memory: Decreased recall of precautions              Exercises      General Comments General comments (skin integrity, edema, etc.): Lengthy discussion about post op shoe and importance of wearing it at home when OOB or walking. Pt concerned about wrapping foot at home. Notified him that MD will be teaching him how to do this prior to d/c and will provide supplies. Discussed taking a break from work.       Pertinent Vitals/Pain Pain Assessment: 0-10 Pain Score:  (not rated on pain scale.) Pain Location: left foot Pain Descriptors / Indicators: Tender Pain Intervention(s): Monitored during session;Premedicated before session;Repositioned    Home Living                      Prior Function            PT Goals (current goals  can now be found in the care plan section) Progress towards PT goals: Progressing toward goals    Frequency  Min 3X/week    PT Plan Current plan remains appropriate    Co-evaluation             End of Session Equipment Utilized During Treatment: Gait belt;Other (comment) Activity Tolerance: Patient tolerated treatment well Patient left: in bed;with call bell/phone within reach;with  family/visitor present     Time: 8413-2440 PT Time Calculation (min) (ACUTE ONLY): 21 min  Charges:  $Gait Training: 8-22 mins                    G CodesAlvie Heidelberg A May 22, 2014, 4:26 PM  Alvie Heidelberg, PT, DPT [phone]

## 2014-05-02 NOTE — Progress Notes (Signed)
Patient ID: Jamie MessingWalter Hinchliffe, male   DOB: 02/09/1951, 64 y.o.   MRN: [id number] I changed his dressing at the bedside.  His left foot looks significantly better overall.  I anticipate him being able to go home tomorrow.  I'll change his dressing again in the am and show him how to do it as well as give hime some dressings to take home.

## 2014-05-03 MED ORDER — GLIMEPIRIDE 1 MG PO TABS: 1.0000 mg | ORAL_TABLET | Freq: Every day | ORAL | Status: DC

## 2014-05-03 MED ORDER — OXYCODONE HCL 5 MG PO TABS: 5.0000 mg | ORAL_TABLET | Freq: Four times a day (QID) | ORAL | Status: DC | PRN

## 2014-05-03 MED ORDER — DIPHENHYDRAMINE HCL 25 MG PO CAPS: 25.0000 mg | ORAL_CAPSULE | Freq: Four times a day (QID) | ORAL | Status: DC | PRN

## 2014-05-03 MED ORDER — BLOOD GLUCOSE MONITOR KIT: PACK | Status: DC

## 2014-05-03 MED ORDER — GLIMEPIRIDE 1 MG PO TABS
1.0000 mg | ORAL_TABLET | Freq: Every day | ORAL | Status: DC
  Filled 2014-05-03: qty 1

## 2014-05-03 MED ORDER — DOCUSATE SODIUM 100 MG PO CAPS: 100.0000 mg | ORAL_CAPSULE | Freq: Two times a day (BID) | ORAL | Status: DC | PRN

## 2014-05-03 MED ORDER — PROMETHAZINE HCL 12.5 MG PO TABS: 12.5000 mg | ORAL_TABLET | Freq: Four times a day (QID) | ORAL | Status: DC | PRN

## 2014-05-03 MED ORDER — DOXYCYCLINE HYCLATE 100 MG PO TABS: 100.0000 mg | ORAL_TABLET | Freq: Two times a day (BID) | ORAL | Status: DC

## 2014-05-03 MED ORDER — DOXYCYCLINE HYCLATE 100 MG PO TABS
100.0000 mg | ORAL_TABLET | Freq: Two times a day (BID) | ORAL | Status: DC
  Administered 2014-05-03: 100 mg via ORAL
  Filled 2014-05-03: qty 1

## 2014-05-03 NOTE — Discharge Summary (Signed)
Physician Discharge Summary  Patient ID: Jamie Clark MRN: [id number] DOB/AGE: 1950-07-15 64 y.o.  Admit date: 04/30/2014 Discharge date: 05/03/2014  Primary Care Physician:  Raeanne Gathers, MD  Discharge Diagnoses:    . Cellulitis and abscess of left foot . Acute renal failure . HIV infection . Chronic viral hepatitis C . HTN (hypertension) . Dyslipidemia . borderline diabetes mellitus    Consults:  Orthopedics, Dr. Ninfa Linden   Recommendations for Outpatient Follow-up:  Patient was recommended dressing changes daily, home health nurse was also arranged  Please follow hemoglobin A1c, currently 6.1, patient was started on low-dose Amaryl 1 mg daily   DIET: Carb modified diet    Allergies:  No Known Allergies   Discharge Medications:   Medication List    TAKE these medications        Abacavir-Dolutegravir-Lamivud 600-50-300 MG Tabs  Commonly known as:  TRIUMEQ  Take 1 tablet by mouth daily.     amLODipine 10 MG tablet  Commonly known as:  NORVASC  Take 1 tablet (10 mg total) by mouth at bedtime.     blood glucose meter kit and supplies Kit  Dispense based on patient and insurance preference. Use up to four times daily as directed. (FOR ICD-9 250.00, 250.01).     diphenhydrAMINE 25 mg capsule  Commonly known as:  BENADRYL  Take 1 capsule (25 mg total) by mouth every 6 (six) hours as needed for itching.     docusate sodium 100 MG capsule  Commonly known as:  COLACE  Take 1 capsule (100 mg total) by mouth 2 (two) times daily as needed for mild constipation. For constipation     doxycycline 100 MG tablet  Commonly known as:  VIBRA-TABS  Take 1 tablet (100 mg total) by mouth 2 (two) times daily. X 2 weeks     glimepiride 1 MG tablet  Commonly known as:  AMARYL  Take 1 tablet (1 mg total) by mouth daily with breakfast.  Start taking on:  05/04/2014     hydrochlorothiazide 25 MG tablet  Commonly known as:  HYDRODIURIL  Take 1 tablet (25 mg total) by  mouth at bedtime.     lisinopril 20 MG tablet  Commonly known as:  PRINIVIL,ZESTRIL  Take 1 tablet (20 mg total) by mouth daily. Take 1/2 tablet per day until we see u next     oxyCODONE 5 MG immediate release tablet  Commonly known as:  Oxy IR/ROXICODONE  Take 1 tablet (5 mg total) by mouth every 6 (six) hours as needed for moderate pain.     promethazine 12.5 MG tablet  Commonly known as:  PHENERGAN  Take 1 tablet (12.5 mg total) by mouth every 6 (six) hours as needed for nausea.         Brief H and P: For complete details please refer to admission H and P, but in brief Jamie Clark is a pleasant 64 y.o. male, with history of HIV positive status followed at the [facility] ID clinic, Hepatitis C positive, hypertension and dyslipidemia. He presented to the ER with complaints of left foot pain and swelling. He reported that the symptoms began about one week prior. Because of increasing pain, a sensation of fever in the leg and foul smell he presented to the emergency room for further evaluation. Patient did not have any leukocytosis or fever. Unfortunately the foot appeared infected. Blood cultures were obtained and patient was given a dose of Cleocin in the ER. Patient also reported new rash on the  legs and arms since the onset of the problem with the foot. He stated he has previously been treated for likely tinea pedis/yeast and possible early cellulitis with oral medications without any improvement in symptoms.    Hospital Course:   Cellulitis and abscess of left foot X-ray of the left foot showed cortical erosion at the base of the proximal phalanx of the first digit with differential diagnosis of osteomyelitis versus Gout. MRI of the foot showed dorsal subcutaneous edema in the foot tracking back over the malleoli, no definitive abscess or osteomyelitis, mild plantar fasciitis. Orthopedics was consulted. He was placed on IV antibiotics, vancomycin. Patient was seen by Dr Ninfa Linden who  did the dressing changes daily, cleared by orthopedics for discharge today, on doxycycline for 2 weeks. Patient will see Dr. Ninfa Linden in office in 1 week.   HIV infection - Last CD4 count in April 2015 greater than 900, continue antiretroviral medications, patient has appointment with Dr. Graylon Good on 3/17.   Chronic viral hepatitis C - Total bilirubin normal, mild elevation in AST at 44  Acute renal failure: Creatinine stable - Likely due to #1, cellulitis and pain, HCTZ and ACE inhibitor was held briefly   Dyslipidemia - Not on medications prior to admission   HTN (hypertension) - BP controlled, continue Norvasc   Borderline diabetes, hemoglobin A1c 6.1, given patient has significant wound on his left foot, started on low-dose Amaryl 1 mg daily. Patient was recommended to eat carb modified diet, and diabetes consult and nutrition consult was provided to the patient.   Day of Discharge BP 123/78 mmHg  Pulse 70  Temp(Src) 98 F (36.7 C) (Oral)  Resp 18  Ht 5' 8"  (1.727 m)  Wt 77.111 kg (170 lb)  BMI 25.85 kg/m2  SpO2 97%  Physical Exam: General: Alert and awake oriented x3 not in any acute distress. HEENT: anicteric sclera, pupils reactive to light and accommodation CVS: S1-S2 clear no murmur rubs or gallops Chest: clear to auscultation bilaterally, no wheezing rales or rhonchi Abdomen: soft nontender, nondistended, normal bowel sounds Extremities: no cyanosis, clubbing or edema noted bilaterally, dressing intact to the left foot Neuro: Cranial nerves II-XII intact, no focal neurological deficits   The results of significant diagnostics from this hospitalization (including imaging, microbiology, ancillary and laboratory) are listed below for reference.    LAB RESULTS: Basic Metabolic Panel:  Recent Labs Lab 04/30/14 1455 05/01/14 0456  NA 137 139  K 3.7 3.4*  CL 109 111  CO2 22 24  GLUCOSE 120* 136*  BUN 23 17  CREATININE 1.22 1.29  CALCIUM 8.6 8.2*   Liver  Function Tests:  Recent Labs Lab 04/30/14 1455 05/01/14 0456  AST 44* 35  ALT 52 43  ALKPHOS 75 65  BILITOT 0.6 0.7  PROT 7.3 6.0  ALBUMIN 3.4* 2.9*   No results for input(s): LIPASE, AMYLASE in the last 168 hours. No results for input(s): AMMONIA in the last 168 hours. CBC:  Recent Labs Lab 04/30/14 1455 05/01/14 0456  WBC 4.4 4.0  NEUTROABS 2.0  --   HGB 14.1 13.3  HCT 41.2 39.3  MCV 89.0 89.3  PLT 224 223   Cardiac Enzymes: No results for input(s): CKTOTAL, CKMB, CKMBINDEX, TROPONINI in the last 168 hours. BNP: Invalid input(s): POCBNP CBG: No results for input(s): GLUCAP in the last 168 hours.  Significant Diagnostic Studies:  Dg Chest 2 View  04/30/2014   CLINICAL DATA:  Shortness of breath.  Hypertension.  Tobacco use.  EXAM: CHEST  2 VIEW  COMPARISON:  04/02/2011  FINDINGS: Mild scarring in the left lower lobe. The lungs appear otherwise clear. Cardiac and mediastinal margins appear normal.  No pleural effusion noted.  IMPRESSION: 1. Mild scarring in the left lower lobe, otherwise negative.   Electronically Signed   By: Sherryl Barters M.D.   On: 04/30/2014 18:04   Mr Foot Left Wo Contrast  04/30/2014   CLINICAL DATA:  Cellulitis and abscess of the left foot with pain.  EXAM: MRI OF THE LEFT FOOT WITHOUT CONTRAST  TECHNIQUE: Multiplanar, multisequence MR imaging was performed. No intravenous contrast was administered. We performed are osteomyelitis protocol, which includes the entire foot for infection screening purposes but which does not sensitively assess for smaller structures such as ligaments.  COMPARISON:  04/30/2014.  FINDINGS: Presumably due to difficulty positioning the patient, on various imaging sequences the fifth metatarsophalangeal joint and parts of the fifth toe were excluded. These areas are included on the images marked "Sagittal" but not on the coronal or axial sequences. No osseous edema observed in the forefoot, midfoot, or hindfoot to favor  osteomyelitis.  No abnormal osseous edema is present in the forefoot, hindfoot, or midfoot to favor osteomyelitis.  Dorsal subcutaneous edema is present in the foot and tracks back to the ankle overlying the malleoli.  There is some mild thickening of the medial band of the plantar fascia at its proximal attachment site along the plantar calcaneal spur.  A well-defined abscess is not identified.  IMPRESSION: 1. Dorsal subcutaneous edema in the foot tracking back over the malleoli. This could well reflect cellulitis but I do not see a definite abscess or findings of osteomyelitis. 2. Mild plantar fasciitis.   Electronically Signed   By: Sherryl Barters M.D.   On: 04/30/2014 21:12   Dg Foot Complete Left  04/30/2014   CLINICAL DATA:  Left foot pain for 1 week appearing a  EXAM: LEFT FOOT - COMPLETE 3+ VIEW  COMPARISON:  None  FINDINGS: Along the proximal lateral corner of proximal phalanx of the first digit there is a cortical erosion seen on the oblique projection. No additional cortical erosions are evident in the midfoot or forefoot. Swelling in the forefoot.  IMPRESSION: Cortical erosion at the base of the proximal phalanx of the first digit with differential diagnosis of osteomyelitis versus gout.   Electronically Signed   By: Suzy Bouchard M.D.   On: 04/30/2014 18:07    2D ECHO:   Disposition and Follow-up:     Discharge Instructions    Diet - low sodium heart healthy    Complete by:  As directed      Discharge instructions    Complete by:  As directed   Wound care as explained to you by Dr Ninfa Linden     Increase activity slowly    Complete by:  As directed             DISPOSITION: Home with home health RN   DISCHARGE FOLLOW-UP Follow-up Information    Follow up with Mcarthur Rossetti, MD. Schedule an appointment as soon as possible for a visit in 1 week.   Specialty:  Orthopedic Surgery   Contact information:   [place] [address] [phone]        Follow up with Carlyle Basques, MD On 06/07/2014.   Specialty:  Infectious Diseases   Why:  for hospital follow-up at 10:00AM   Contact information:   [place]  [id number] [phone]       Follow up with [facility]    . Schedule an appointment as soon as possible for a visit in 10 days.   Why:  for hospital follow-up   Contact information:   [place] [address] [phone]       Time spent on Discharge: 35 mins  Signed:   Maxey Ransom M.D. [facility] 05/03/2014, 11:30 AM Pager: [phone]

## 2014-05-03 NOTE — Progress Notes (Signed)
Patient ID: Jamie Clark, male   DOB: 12/24/1950, 64 y.o.   MRN: [id number] His foot continues to improve.  I instructed him and his girlfriend about foot care and when to clean his foot as well as how to keep it dry.  His cellulitis has resolved.  He can be discharge to home today on oral ABX (Doxycycline or Bactrim DS) for the next week.  He knows to stay out of work the rest of this week and next week and to get wool socks to wear in his rubber work boots.  I need to see him in follow-up in my office in one week.

## 2014-05-03 NOTE — Care Management Note (Signed)
  Page 1 of 1   05/03/2014     10:41:51 AM CARE MANAGEMENT NOTE 05/03/2014  Patient:  Jamie Clark,Jamie Clark   Account Number:  [id number]  Date Initiated:  05/03/2014  Documentation initiated by:  Ronny FlurryWILE,Jerome Otter  Subjective/Objective Assessment:     Action/Plan:   Anticipated DC Date:  05/03/2014   Anticipated DC Plan:  HOME W HOME HEALTH SERVICES  In-house referral  Financial Counselor      DC Planning Services  [facility]  Medication Assistance      Choice offered to / List presented to:  C-1 Patient        HH arranged  HH-1 RN      [facility]   Status of service:  Completed, signed off Medicare Important Message given?   (If response is "NO", the following Medicare IM given date fields will be blank) Date Medicare IM given:   Medicare IM given by:   Date Additional Medicare IM given:   Additional Medicare IM given by:    Discharge Disposition:  HOME W HOME HEALTH SERVICES  Per UR Regulation:  Reviewed for med. necessity/level of care/duration of stay  If discussed at Long Length of Stay Meetings, dates discussed:    Comments:

## 2014-05-03 NOTE — Discharge Instructions (Signed)
Keep your left foot as dry as possible over the next week. Try to find some Smart Wool socks to wear in your work boots. Elevation for swelling. You can shower in the am 2/12 and get your left foot wet; then dry it off well and keep it completely dry the rest of the day with elevation as well. Can keep all dressings off of your foot 2/12 except dry guaze or cotton between your toes.

## 2014-05-03 NOTE — Progress Notes (Signed)
Discharge instructions reviewed with patient.  Copy on instructions and scripts given to patient.  Pt received education from dietician. Pt refused to stay for diabetes education.  Pt states he has family member who will help him glucometer.  Pt able to verbalize wound care upon review.  Pt was discharged via wheelchair with belongings, escorted by hospital volunteer.  Family picking pt up at entrance.

## 2014-05-03 NOTE — Plan of Care (Signed)
Problem: Food- and Nutrition-Related Knowledge Deficit (NB-1.1) Goal: Nutrition education Formal process to instruct or train a patient/client in a skill or to impart knowledge to help patients/clients voluntarily manage or modify food choices and eating behavior to maintain or improve health. Outcome: Adequate for Discharge  RD consulted for nutrition education regarding diabetes.     Lab Results  Component Value Date    HGBA1C 6.1* 04/30/2014    RD provided "Carbohydrate Counting for People with Diabetes" handout from the Academy of Nutrition and Dietetics. Discussed different food groups and their effects on blood sugar, emphasizing carbohydrate-containing foods. Provided list of carbohydrates and recommended serving sizes of common foods.  Discussed importance of controlled and consistent carbohydrate intake throughout the day. Provided examples of ways to balance meals/snacks and encouraged intake of high-fiber, whole grain complex carbohydrates. Teach back method used.  Expect very good compliance.  Body mass index is 25.85 kg/(m^2). Pt meets criteria for overweight based on current BMI.  Current diet order is Carb modified, patient is consuming approximately 100% of meals at this time. Labs and medications reviewed. No further nutrition interventions warranted at this time. RD contact information provided. If additional nutrition issues arise, please re-consult RD.  Krisanne Lich A. Mayford KnifeWilliams, RD, LDN, CDE Pager: [phone] After hours Pager: [phone]

## 2014-05-06 LAB — CULTURE, BLOOD (ROUTINE X 2)
CULTURE: NO GROWTH
CULTURE: NO GROWTH

## 2014-06-07 ENCOUNTER — Encounter: Payer: Self-pay | Admitting: Internal Medicine

## 2014-06-07 ENCOUNTER — Ambulatory Visit (INDEPENDENT_AMBULATORY_CARE_PROVIDER_SITE_OTHER): Payer: 59 | Admitting: Internal Medicine

## 2014-06-07 VITALS — BP 122/82 | HR 73 | Temp 97.5°F | Wt 171.0 lb

## 2014-06-07 DIAGNOSIS — B353 Tinea pedis: Secondary | ICD-10-CM

## 2014-06-07 MED ORDER — KETOCONAZOLE 2 % EX CREA: 1.0000 "application " | TOPICAL_CREAM | Freq: Every day | CUTANEOUS | Status: DC

## 2014-06-07 NOTE — Progress Notes (Signed)
Patient ID: Jamie Clark, male   DOB: Nov 17, 1950, 64 y.o.   MRN: [id number]        Patient ID: Jamie Clark, male   DOB: 10/19/1950, 64 y.o.   MRN: [id number]  HPI [name] is a 64yo M with HIV disease well controlled on triumeq,  He was hospitalized for cellulitis to left foot and lower leg, likely related to tinea pedis. Improved but still having evidence of tinea pedis. Trace edema, standing a lot on his job. Doing well with his hiv meds. He is starting to record his bs since he was told that he is prediabetic.  Outpatient Encounter Prescriptions as of 06/07/2014  Medication Sig  . Abacavir-Dolutegravir-Lamivud (TRIUMEQ) 600-50-300 MG TABS Take 1 tablet by mouth daily.  [name] amLODipine (NORVASC) 10 MG tablet Take 1 tablet (10 mg total) by mouth at bedtime.  . blood glucose meter kit and supplies KIT Dispense based on patient and insurance preference. Use up to four times daily as directed. (FOR ICD-9 250.00, 250.01).  [name] diphenhydrAMINE (BENADRYL) 25 mg capsule Take 1 capsule (25 mg total) by mouth every 6 (six) hours as needed for itching.  . docusate sodium (COLACE) 100 MG capsule Take 1 capsule (100 mg total) by mouth 2 (two) times daily as needed for mild constipation. For constipation  . glimepiride (AMARYL) 1 MG tablet Take 1 tablet (1 mg total) by mouth daily with breakfast.  . hydrochlorothiazide (HYDRODIURIL) 25 MG tablet Take 1 tablet (25 mg total) by mouth at bedtime.  [name] lisinopril (PRINIVIL,ZESTRIL) 20 MG tablet Take 1 tablet (20 mg total) by mouth daily. Take 1/2 tablet per day until we see u next  . promethazine (PHENERGAN) 12.5 MG tablet Take 1 tablet (12.5 mg total) by mouth every 6 (six) hours as needed for nausea.  [name] doxycycline (VIBRA-TABS) 100 MG tablet Take 1 tablet (100 mg total) by mouth 2 (two) times daily. X 2 weeks (Patient not taking: Reported on 06/07/2014)  . oxyCODONE (OXY IR/ROXICODONE) 5 MG immediate release tablet Take 1 tablet (5 mg total) by mouth every 6  (six) hours as needed for moderate pain. (Patient not taking: Reported on 06/07/2014)     Patient Active Problem List   Diagnosis Date Noted  . Acute pain of left foot 04/30/2014  . Hyperglycemia 04/30/2014  . Cellulitis and abscess of left foot 04/30/2014  . Acute renal failure 04/30/2014  . Tinea pedis of left foot 10/23/2013  . Phlebitis 04/01/2011    Class: History of  . INSOMNIA 04/14/2010  . Dyslipidemia 01/17/2009  . HTN (hypertension) 01/17/2009  . HIV infection 12/18/2008  . Chronic viral hepatitis C 07/13/2007     Health Maintenance Due  Topic Date Due  . TETANUS/TDAP  01/22/1971  . COLONOSCOPY  01/21/2002  . ZOSTAVAX  01/22/2012     Review of Systems  Physical Exam   BP 122/82 mmHg  Pulse 73  Temp(Src) 97.5 F (36.4 C) (Oral)  Wt 171 lb (77.565 kg) Physical Exam  Constitutional: He is oriented to person, place, and time. He appears well-developed and well-nourished. No distress.  HENT:  Mouth/Throat: Oropharynx is clear and moist. No oropharyngeal exudate.  Cardiovascular: Normal rate, regular rhythm and normal heart sounds. Exam reveals no gallop and no friction rub.  No murmur heard.  Pulmonary/Chest: Effort normal and breath sounds normal. No respiratory distress. He has no wheezes.  Lymphadenopathy:  He has no cervical adenopathy.  Neurological: He is alert and oriented to person, place, and time.  Skin: Skin  is warm and dry. No rash noted. + tinea pedis Psychiatric: He has a normal mood and affect. His behavior is normal.    Lab Results  Component Value Date   CD4TCELL 43 05/01/2014   Lab Results  Component Value Date   CD4TABS 880 05/01/2014   CD4TABS 760 02/20/2014   CD4TABS 950 10/23/2013   Lab Results  Component Value Date   HIV1RNAQUANT <20 02/20/2014   Lab Results  Component Value Date   HEPBSAB NEG 05/06/2010   No results found for: RPR  CBC Lab Results  Component Value Date   WBC 4.0 05/01/2014   RBC 4.40 05/01/2014    HGB 13.3 05/01/2014   HCT 39.3 05/01/2014   PLT 223 05/01/2014   MCV 89.3 05/01/2014   MCH 30.2 05/01/2014   MCHC 33.8 05/01/2014   RDW 13.2 05/01/2014   LYMPHSABS 1.8 04/30/2014   MONOABS 0.4 04/30/2014   EOSABS 0.2 04/30/2014   BASOSABS 0.0 04/30/2014   BMET Lab Results  Component Value Date   NA 139 05/01/2014   K 3.4* 05/01/2014   CL 111 05/01/2014   CO2 24 05/01/2014   GLUCOSE 136* 05/01/2014   BUN 17 05/01/2014   CREATININE 1.29 05/01/2014   CALCIUM 8.2* 05/01/2014   GFRNONAA 58* 05/01/2014   GFRAA 67* 05/01/2014     Assessment and Plan  hiv = well controlled continue on triumeq  hcv = will apply for harvoni since his insurance reinstated  Tinea pedis = recommend antifungal cream and powders  Pedal edema = will do a trial of compression stalkings  Prediabetic = will give rx for test strips can test bs 2 x per week to follow up with pcp  Needs pcp, will refer to [facility]

## 2014-06-09 ENCOUNTER — Other Ambulatory Visit: Payer: Self-pay | Admitting: Internal Medicine

## 2014-06-11 ENCOUNTER — Telehealth: Payer: Self-pay | Admitting: *Deleted

## 2014-06-11 NOTE — Telephone Encounter (Signed)
Patient calling regarding the need for a prescription for his compression stockings.  RN left message for more clarification. Andree CossHowell, Olivya Sobol M, RN

## 2014-06-12 ENCOUNTER — Other Ambulatory Visit: Payer: Self-pay | Admitting: Pharmacist Clinician (PhC)/ Clinical Pharmacy Specialist

## 2014-06-12 ENCOUNTER — Telehealth: Payer: Self-pay | Admitting: Pharmacist Clinician (PhC)/ Clinical Pharmacy Specialist

## 2014-06-12 MED ORDER — LEDIPASVIR-SOFOSBUVIR 90-400 MG PO TABS: 1.0000 | ORAL_TABLET | Freq: Every day | ORAL | Status: DC

## 2014-06-12 NOTE — Telephone Encounter (Signed)
i have a script but not sure if it will be covered for him. i will drop it off at the triage desk

## 2014-06-12 NOTE — Telephone Encounter (Signed)
Patient coming 3/23 to get prescription for compression stockings. He was unable to fill diabetic test strips, is bringing in what the pharmacist recommends in place. Compression rx on computer monitor in triage (desk [phone]).

## 2014-06-12 NOTE — Telephone Encounter (Signed)
Called Jamie Clark to let him know that he has been approved for his Harvoni. Explain all of the side effects to him and to avoid antacids and PPI. Outpt pharmacy will call him when meds are ready to be picked up.

## 2014-06-18 ENCOUNTER — Other Ambulatory Visit: Payer: Self-pay | Admitting: *Deleted

## 2014-06-18 DIAGNOSIS — R739 Hyperglycemia, unspecified: Secondary | ICD-10-CM

## 2014-06-18 MED ORDER — BLOOD GLUCOSE MONITOR KIT: PACK | Status: DC

## 2014-06-19 ENCOUNTER — Other Ambulatory Visit: Payer: Self-pay | Admitting: Licensed Clinical Social Worker

## 2014-06-19 DIAGNOSIS — I10 Essential (primary) hypertension: Secondary | ICD-10-CM

## 2014-06-19 MED ORDER — LISINOPRIL 20 MG PO TABS: 20.0000 mg | ORAL_TABLET | Freq: Every day | ORAL | Status: DC

## 2014-06-20 ENCOUNTER — Telehealth: Payer: Self-pay | Admitting: *Deleted

## 2014-06-20 NOTE — Telephone Encounter (Signed)
Patient was called and advised of the appt for [facility] liver [facility] for 07[facility]11[facility]14 [facility]. The patient advised he no longer needs the appt as his insurance does not require it to start medication and he picks up his meds tomorrow 06/21/14

## 2014-06-21 ENCOUNTER — Other Ambulatory Visit: Payer: Self-pay | Admitting: Internal Medicine

## 2014-06-21 DIAGNOSIS — B182 Chronic viral hepatitis C: Secondary | ICD-10-CM

## 2014-07-07 ENCOUNTER — Other Ambulatory Visit: Payer: Self-pay | Admitting: Internal Medicine

## 2014-07-11 ENCOUNTER — Ambulatory Visit (HOSPITAL_COMMUNITY): Payer: MEDICAID

## 2014-07-12 ENCOUNTER — Other Ambulatory Visit: Payer: 59

## 2014-07-17 ENCOUNTER — Other Ambulatory Visit: Payer: 59

## 2014-07-17 DIAGNOSIS — B182 Chronic viral hepatitis C: Secondary | ICD-10-CM

## 2014-07-17 LAB — CBC WITH DIFFERENTIAL/PLATELET
BASOS ABS: 0 10*3/uL (ref 0.0–0.1)
Basophils Relative: 1 % (ref 0–1)
EOS PCT: 7 % — AB (ref 0–5)
Eosinophils Absolute: 0.3 10*3/uL (ref 0.0–0.7)
HEMATOCRIT: 45.3 % (ref 39.0–52.0)
Hemoglobin: 15.6 g/dL (ref 13.0–17.0)
Lymphocytes Relative: 52 % — ABNORMAL HIGH (ref 12–46)
Lymphs Abs: 2.1 10*3/uL (ref 0.7–4.0)
MCH: 30.5 pg (ref 26.0–34.0)
MCHC: 34.4 g/dL (ref 30.0–36.0)
MCV: 88.5 fL (ref 78.0–100.0)
MPV: 9.8 fL (ref 8.6–12.4)
Monocytes Absolute: 0.4 10*3/uL (ref 0.1–1.0)
Monocytes Relative: 10 % (ref 3–12)
Neutro Abs: 1.2 10*3/uL — ABNORMAL LOW (ref 1.7–7.7)
Neutrophils Relative %: 30 % — ABNORMAL LOW (ref 43–77)
Platelets: 290 10*3/uL (ref 150–400)
RBC: 5.12 MIL/uL (ref 4.22–5.81)
RDW: 14.2 % (ref 11.5–15.5)
WBC: 4 10*3/uL (ref 4.0–10.5)

## 2014-07-18 LAB — COMPREHENSIVE METABOLIC PANEL
ALT: 36 U/L (ref 0–53)
AST: 31 U/L (ref 0–37)
Albumin: 3.7 g/dL (ref 3.5–5.2)
Alkaline Phosphatase: 65 U/L (ref 39–117)
BUN: 15 mg/dL (ref 6–23)
CO2: 27 meq/L (ref 19–32)
Calcium: 9.2 mg/dL (ref 8.4–10.5)
Chloride: 103 mEq/L (ref 96–112)
Creat: 1.26 mg/dL (ref 0.50–1.35)
GLUCOSE: 88 mg/dL (ref 70–99)
Potassium: 3.9 mEq/L (ref 3.5–5.3)
SODIUM: 138 meq/L (ref 135–145)
TOTAL PROTEIN: 8.1 g/dL (ref 6.0–8.3)
Total Bilirubin: 0.6 mg/dL (ref 0.2–1.2)

## 2014-07-18 LAB — HEPATITIS C RNA QUANTITATIVE
HCV Quantitative Log: <1.18 {Log} (ref <1.18)
HCV Quantitative: <15 IU/mL (ref <15)

## 2014-07-30 ENCOUNTER — Other Ambulatory Visit: Payer: 59

## 2014-08-06 ENCOUNTER — Other Ambulatory Visit: Payer: Self-pay | Admitting: *Deleted

## 2014-08-06 DIAGNOSIS — B20 Human immunodeficiency virus [HIV] disease: Secondary | ICD-10-CM

## 2014-08-06 MED ORDER — ABACAVIR-DOLUTEGRAVIR-LAMIVUD 600-50-300 MG PO TABS: 1.0000 | ORAL_TABLET | Freq: Every day | ORAL | Status: DC

## 2014-09-06 ENCOUNTER — Ambulatory Visit (INDEPENDENT_AMBULATORY_CARE_PROVIDER_SITE_OTHER): Payer: 59 | Admitting: Internal Medicine

## 2014-09-06 ENCOUNTER — Encounter: Payer: Self-pay | Admitting: Internal Medicine

## 2014-09-06 VITALS — BP 130/89 | HR 65 | Temp 97.8°F | Ht 67.0 in | Wt 172.0 lb

## 2014-09-06 DIAGNOSIS — B182 Chronic viral hepatitis C: Secondary | ICD-10-CM

## 2014-09-06 DIAGNOSIS — B353 Tinea pedis: Secondary | ICD-10-CM

## 2014-09-06 DIAGNOSIS — B20 Human immunodeficiency virus [HIV] disease: Secondary | ICD-10-CM

## 2014-09-06 LAB — COMPLETE METABOLIC PANEL WITH GFR
ALT: 21 U/L (ref 0–53)
AST: 23 U/L (ref 0–37)
Albumin: 3.7 g/dL (ref 3.5–5.2)
Alkaline Phosphatase: 69 U/L (ref 39–117)
BUN: 17 mg/dL (ref 6–23)
CALCIUM: 9.1 mg/dL (ref 8.4–10.5)
CHLORIDE: 102 meq/L (ref 96–112)
CO2: 26 meq/L (ref 19–32)
CREATININE: 1.39 mg/dL — AB (ref 0.50–1.35)
GFR, EST AFRICAN AMERICAN: 62 mL/min
GFR, Est Non African American: 54 mL/min — ABNORMAL LOW
Glucose, Bld: 90 mg/dL (ref 70–99)
Potassium: 3.7 mEq/L (ref 3.5–5.3)
Sodium: 137 mEq/L (ref 135–145)
Total Bilirubin: 0.7 mg/dL (ref 0.2–1.2)
Total Protein: 7.7 g/dL (ref 6.0–8.3)

## 2014-09-06 LAB — CBC WITH DIFFERENTIAL/PLATELET
Basophils Absolute: 0 10*3/uL (ref 0.0–0.1)
Basophils Relative: 1 % (ref 0–1)
Eosinophils Absolute: 0.3 10*3/uL (ref 0.0–0.7)
Eosinophils Relative: 7 % — ABNORMAL HIGH (ref 0–5)
HCT: 41 % (ref 39.0–52.0)
Hemoglobin: 14.7 g/dL (ref 13.0–17.0)
Lymphocytes Relative: 52 % — ABNORMAL HIGH (ref 12–46)
Lymphs Abs: 2.1 10*3/uL (ref 0.7–4.0)
MCH: 32.1 pg (ref 26.0–34.0)
MCHC: 35.9 g/dL (ref 30.0–36.0)
MCV: 89.5 fL (ref 78.0–100.0)
MONOS PCT: 10 % (ref 3–12)
MPV: 9.4 fL (ref 8.6–12.4)
Monocytes Absolute: 0.4 10*3/uL (ref 0.1–1.0)
NEUTROS ABS: 1.2 10*3/uL — AB (ref 1.7–7.7)
NEUTROS PCT: 30 % — AB (ref 43–77)
Platelets: 269 10*3/uL (ref 150–400)
RBC: 4.58 MIL/uL (ref 4.22–5.81)
RDW: 14.2 % (ref 11.5–15.5)
WBC: 4 10*3/uL (ref 4.0–10.5)

## 2014-09-06 MED ORDER — TERBINAFINE HCL 1 % EX CREA: 1.0000 "application " | TOPICAL_CREAM | Freq: Two times a day (BID) | CUTANEOUS | Status: DC

## 2014-09-06 NOTE — Progress Notes (Signed)
Patient ID: Jamie Clark, male   DOB: 1951-01-06, 64 y.o.   MRN: [id number]       Patient ID: Jamie Clark, male   DOB: 11-Dec-1950, 64 y.o.   MRN: [id number]  HPI 64yo M with HIV disease- chronic hepatitis C  Without hepatic coma.he is on his 10 th of 12 weeks of harvoni. Thus far doing well. HIV is well controlled, CD 4 count of 880/VL<12 May 2014. He reports still having difficulty with athelete's foot to left foot predominantly. No difficulty with meds, and adherence.  Outpatient Encounter Prescriptions as of 09/06/2014  Medication Sig  . Abacavir-Dolutegravir-Lamivud (TRIUMEQ) 600-50-300 MG TABS Take 1 tablet by mouth daily.  [name] amLODipine (NORVASC) 10 MG tablet Take 1 tablet (10 mg total) by mouth at bedtime.  . blood glucose meter kit and supplies KIT To check blood glucose twice per week.  Per insurance, patient needs to use One Touch Mini monitor, One Touch Ultra Test strips #88, One Touch Delica lancets. (Patient not taking: Reported on 09/06/2014)  . diphenhydrAMINE (BENADRYL) 25 mg capsule Take 1 capsule (25 mg total) by mouth every 6 (six) hours as needed for itching. (Patient not taking: Reported on 09/06/2014)  . docusate sodium (COLACE) 100 MG capsule Take 1 capsule (100 mg total) by mouth 2 (two) times daily as needed for mild constipation. For constipation (Patient not taking: Reported on 09/06/2014)  . doxycycline (VIBRA-TABS) 100 MG tablet Take 1 tablet (100 mg total) by mouth 2 (two) times daily. X 2 weeks (Patient not taking: Reported on 06/07/2014)  . glimepiride (AMARYL) 1 MG tablet Take 1 tablet (1 mg total) by mouth daily with breakfast. (Patient not taking: Reported on 09/06/2014)  . hydrochlorothiazide (HYDRODIURIL) 25 MG tablet Take 1 tablet (25 mg total) by mouth at bedtime. (Patient not taking: Reported on 09/06/2014)  . ketoconazole (NIZORAL) 2 % cream Apply 1 application topically daily. (Patient not taking: Reported on 09/06/2014)  . Ledipasvir-Sofosbuvir (HARVONI)  90-400 MG TABS Take 1 tablet by mouth daily. (Patient not taking: Reported on 09/06/2014)  . lisinopril (PRINIVIL,ZESTRIL) 20 MG tablet TAKE 1/2 TABLET BY MOUTH DAILY UNTIL NEXT APPOINTMENT OR 1 TABLET DAILY AS DIRECTED (Patient not taking: Reported on 09/06/2014)  . oxyCODONE (OXY IR/ROXICODONE) 5 MG immediate release tablet Take 1 tablet (5 mg total) by mouth every 6 (six) hours as needed for moderate pain. (Patient not taking: Reported on 06/07/2014)  . promethazine (PHENERGAN) 12.5 MG tablet Take 1 tablet (12.5 mg total) by mouth every 6 (six) hours as needed for nausea. (Patient not taking: Reported on 09/06/2014)   No facility-administered encounter medications on file as of 09/06/2014.     Patient Active Problem List   Diagnosis Date Noted  . Acute pain of left foot 04/30/2014  . Hyperglycemia 04/30/2014  . Cellulitis and abscess of left foot 04/30/2014  . Acute renal failure 04/30/2014  . Tinea pedis of left foot 10/23/2013  . Phlebitis 04/01/2011    Class: History of  . INSOMNIA 04/14/2010  . Dyslipidemia 01/17/2009  . HTN (hypertension) 01/17/2009  . HIV infection 12/18/2008  . Chronic viral hepatitis C 07/13/2007     Health Maintenance Due  Topic Date Due  . TETANUS/TDAP  01/22/1971  . COLONOSCOPY  01/21/2002  . ZOSTAVAX  01/22/2012     Review of Systems 10 point ros is negative other than rash to feet Physical Exam   BP 130/89 mmHg  Pulse 65  Temp(Src) 97.8 F (36.6 C) (Oral)  Ht 5' 7" (1.702  m)  Wt 172 lb (78.019 kg)  BMI 26.93 kg/m2 Physical Exam  Constitutional: He is oriented to person, place, and time. He appears well-developed and well-nourished. No distress.  HENT:  Mouth/Throat: Oropharynx is clear and moist. No oropharyngeal exudate.  Cardiovascular: Normal rate, regular rhythm and normal heart sounds. Exam reveals no gallop and no friction rub.  No murmur heard.  Pulmonary/Chest: Effort normal and breath sounds normal. No respiratory distress. He has  no wheezes.  Abdominal: Soft. Bowel sounds are normal. He exhibits no distension. There is no tenderness.  Lymphadenopathy:  He has no cervical adenopathy.  Neurological: He is alert and oriented to person, place, and time.  Skin: Skin between toes is macerated. No swelling no erythema. Psychiatric: He has a normal mood and affect. His behavior is normal.    Lab Results  Component Value Date   CD4TCELL 43 05/01/2014   Lab Results  Component Value Date   CD4TABS 880 05/01/2014   CD4TABS 760 02/20/2014   CD4TABS 950 10/23/2013   Lab Results  Component Value Date   HIV1RNAQUANT <20 02/20/2014   Lab Results  Component Value Date   HEPBSAB NEG 05/06/2010   No results found for: RPR  CBC Lab Results  Component Value Date   WBC 4.0 07/17/2014   RBC 5.12 07/17/2014   HGB 15.6 07/17/2014   HCT 45.3 07/17/2014   PLT 290 07/17/2014   MCV 88.5 07/17/2014   MCH 30.5 07/17/2014   MCHC 34.4 07/17/2014   RDW 14.2 07/17/2014   LYMPHSABS 2.1 07/17/2014   MONOABS 0.4 07/17/2014   EOSABS 0.3 07/17/2014   BASOSABS 0.0 07/17/2014   BMET Lab Results  Component Value Date   NA 138 07/17/2014   K 3.9 07/17/2014   CL 103 07/17/2014   CO2 27 07/17/2014   GLUCOSE 88 07/17/2014   BUN 15 07/17/2014   CREATININE 1.26 07/17/2014   CALCIUM 9.2 07/17/2014   GFRNONAA 58* 05/01/2014   GFRAA 67* 05/01/2014     Assessment and Plan hiv disease= well controlled. Continue with triumeq  Chronic hepatitis c without hepatic coma = continue with 2 more weeks of harvoni then will check hcv viral laod in 14 wk  Tinea pedis = will prescribe lamisal and also tell patient to keep it dry. Will refer to podiatry

## 2014-09-07 LAB — HIV-1 RNA QUANT-NO REFLEX-BLD: HIV-1 RNA Quant, Log: <1.3 {Log} (ref <1.30)

## 2014-09-07 LAB — T-HELPER CELL (CD4) - (RCID CLINIC ONLY)
CD4 % Helper T Cell: 44 % (ref 33–55)
CD4 T Cell Abs: 980 /uL (ref 400–2700)

## 2014-09-14 ENCOUNTER — Encounter: Payer: Self-pay | Admitting: *Deleted

## 2014-09-16 ENCOUNTER — Emergency Department (HOSPITAL_COMMUNITY)
Admission: EM | Admit: 2014-09-16 | Discharge: 2014-09-16 | Disposition: A | Discharge status: Home / Self Care | Payer: 59 | Attending: Emergency Medicine | Admitting: Emergency Medicine

## 2014-09-16 ENCOUNTER — Encounter (HOSPITAL_COMMUNITY): Payer: Self-pay

## 2014-09-16 ENCOUNTER — Emergency Department (HOSPITAL_COMMUNITY): Payer: 59

## 2014-09-16 DIAGNOSIS — Z720 Tobacco use: Secondary | ICD-10-CM | POA: Insufficient documentation

## 2014-09-16 DIAGNOSIS — B353 Tinea pedis: Secondary | ICD-10-CM | POA: Insufficient documentation

## 2014-09-16 DIAGNOSIS — N50811 Right testicular pain: Secondary | ICD-10-CM

## 2014-09-16 DIAGNOSIS — N433 Hydrocele, unspecified: Secondary | ICD-10-CM

## 2014-09-16 DIAGNOSIS — E119 Type 2 diabetes mellitus without complications: Secondary | ICD-10-CM | POA: Insufficient documentation

## 2014-09-16 DIAGNOSIS — Z21 Asymptomatic human immunodeficiency virus [HIV] infection status: Secondary | ICD-10-CM | POA: Insufficient documentation

## 2014-09-16 DIAGNOSIS — Z79899 Other long term (current) drug therapy: Secondary | ICD-10-CM | POA: Insufficient documentation

## 2014-09-16 DIAGNOSIS — I10 Essential (primary) hypertension: Secondary | ICD-10-CM | POA: Insufficient documentation

## 2014-09-16 HISTORY — DX: Type 2 diabetes mellitus without complications: E11.9

## 2014-09-16 LAB — URINALYSIS, ROUTINE W REFLEX MICROSCOPIC
Bilirubin Urine: NEGATIVE
GLUCOSE, UA: NEGATIVE mg/dL
HGB URINE DIPSTICK: NEGATIVE
Ketones, ur: NEGATIVE mg/dL
Leukocytes, UA: NEGATIVE
Nitrite: NEGATIVE
Protein, ur: NEGATIVE mg/dL
SPECIFIC GRAVITY, URINE: 1.014 (ref 1.005–1.030)
UROBILINOGEN UA: 1 mg/dL (ref 0.0–1.0)
pH: 6.5 (ref 5.0–8.0)

## 2014-09-16 MED ORDER — OXYCODONE-ACETAMINOPHEN 5-325 MG PO TABS
2.0000 | ORAL_TABLET | Freq: Once | ORAL | Status: AC
  Administered 2014-09-16: 2 via ORAL
  Filled 2014-09-16: qty 2

## 2014-09-16 MED ORDER — NYSTATIN 100000 UNIT/GM EX POWD
Freq: Once | CUTANEOUS | Status: AC
  Administered 2014-09-16: 18:00:00 via TOPICAL
  Filled 2014-09-16: qty 15

## 2014-09-16 MED ORDER — MICONAZOLE NITRATE 2 % EX AERP: 1.0000 "application " | INHALATION_SPRAY | Freq: Two times a day (BID) | CUTANEOUS | Status: DC

## 2014-09-16 NOTE — ED Notes (Signed)
Pt ambulated to restroom with steady gait.

## 2014-09-16 NOTE — ED Provider Notes (Signed)
CSN: [id number]     Arrival date & time 09/16/14  1347 History   First MD Initiated Contact with Patient 09/16/14 1541     Chief Complaint  Patient presents with  . foot wound   . Groin Pain    right     (Consider location/radiation/quality/duration/timing/severity/associated sxs/prior Treatment) Patient is a 64 y.o. male presenting with groin pain. The history is provided by the patient. No language interpreter was used.  Groin Pain This is a recurrent problem. The current episode started yesterday. The problem occurs constantly. The problem has not changed since onset.Pertinent negatives include no chest pain, no abdominal pain, no headaches and no shortness of breath. Nothing aggravates the symptoms. Nothing relieves the symptoms. He has tried nothing for the symptoms. The treatment provided no relief.    Past Medical History  Diagnosis Date  . Hypertension   . HIV positive   . Hepatitis C, chronic   . Diabetes mellitus without complication    History reviewed. No pertinent past surgical history. History reviewed. No pertinent family history. History  Substance Use Topics  . Smoking status: Current Every Day Smoker -- 0.15 packs/day    Types: Cigarettes  . Smokeless tobacco: Never Used     Comment: not ready to quit - cutting back  . Alcohol Use: No    Review of Systems  Constitutional: Negative for fever, activity change, appetite change and fatigue.  HENT: Negative for congestion, facial swelling, rhinorrhea and trouble swallowing.   Eyes: Negative for photophobia and pain.  Respiratory: Negative for cough, chest tightness and shortness of breath.   Cardiovascular: Negative for chest pain and leg swelling.  Gastrointestinal: Negative for nausea, vomiting, abdominal pain, diarrhea and constipation.  Endocrine: Negative for polydipsia and polyuria.  Genitourinary: Positive for scrotal swelling and testicular pain. Negative for dysuria, urgency, decreased urine volume and  difficulty urinating.  Musculoskeletal: Negative for back pain and gait problem.  Skin: Positive for wound. Negative for color change and rash.  Allergic/Immunologic: Negative for immunocompromised state.  Neurological: Negative for dizziness, facial asymmetry, speech difficulty, weakness, numbness and headaches.  Psychiatric/Behavioral: Negative for confusion, decreased concentration and agitation.      Allergies  Review of patient's allergies indicates no known allergies.  Home Medications   Prior to Admission medications   Medication Sig Start Date End Date Taking? Authorizing Provider  Abacavir-Dolutegravir-Lamivud (TRIUMEQ) 600-50-300 MG TABS Take 1 tablet by mouth daily. 08/06/14  Yes Judyann Munson, MD  hydrochlorothiazide (HYDRODIURIL) 25 MG tablet Take 1 tablet (25 mg total) by mouth at bedtime. 04/24/14  Yes Judyann Munson, MD  Ledipasvir-Sofosbuvir (HARVONI) 90-400 MG TABS Take 1 tablet by mouth daily. 06/12/14  Yes Judyann Munson, MD  lisinopril (PRINIVIL,ZESTRIL) 20 MG tablet TAKE 1/2 TABLET BY MOUTH DAILY UNTIL NEXT APPOINTMENT OR 1 TABLET DAILY AS DIRECTED 07/09/14  Yes Judyann Munson, MD  terbinafine (LAMISIL AT) 1 % cream Apply 1 application topically 2 (two) times daily. To feet 09/06/14  Yes Judyann Munson, MD  Miconazole Nitrate 2 % AERP Apply 1 application topically 2 (two) times daily. For 2 weeks 09/16/14   Toy Cookey, MD   BP 150/82 mmHg  Pulse 69  Temp(Src) 97.7 F (36.5 C) (Oral)  Resp 18  Ht 5' 7.5" (1.715 m)  Wt 173 lb (78.472 kg)  BMI 26.68 kg/m2  SpO2 96% Physical Exam  Constitutional: He is oriented to person, place, and time. He appears well-developed and well-nourished. No distress.  HENT:  Head: Normocephalic and atraumatic.  Mouth/Throat:  No oropharyngeal exudate.  Eyes: Pupils are equal, round, and reactive to light.  Neck: Normal range of motion. Neck supple.  Cardiovascular: Normal rate, regular rhythm and normal heart sounds.  Exam reveals  no gallop and no friction rub.   No murmur heard. Pulmonary/Chest: Effort normal and breath sounds normal. No respiratory distress. He has no wheezes. He has no rales.  Abdominal: Soft. Bowel sounds are normal. He exhibits no distension and no mass. There is no tenderness. There is no rebound and no guarding.  Genitourinary:     Musculoskeletal: Normal range of motion. He exhibits no edema or tenderness.       Feet:  Neurological: He is alert and oriented to person, place, and time.  Skin: Skin is warm and dry.  Psychiatric: He has a normal mood and affect.    ED Course  Procedures (including critical care time) Labs Review Labs Reviewed  URINALYSIS, ROUTINE W REFLEX MICROSCOPIC (NOT AT [facility])    Imaging Review US Scrotum  09/16/2014   CLINICAL DATA:  Right testicular pain and swelling which began yesterday  EXAM: SCROTAL ULTRASOUND  DOPPLER ULTRASOUND OF THE TESTICLES  TECHNIQUE: Complete ultrasound examination of the testicles, epididymis, and other scrotal structures was performed. Color and spectral Doppler ultrasound were also utilized to evaluate blood flow to the testicles.  COMPARISON:  None.  FINDINGS: Right testicle  Measurements: 33 x 32 x 37 mm.  Microlithiasis.  No mass.  Left testicle  Measurements:  40 x 25 x 24 mm.  Extensive microlithiasis.  No mass.  Right epididymis: No significant abnormalities. Tiny epididymal head cyst.  Left epididymis:  Tiny epididymal head cyst.  Hydrocele: Very large right hydrocele with debris. Possible loculation within the hydrocele. Significant left hydrocele as well.  Varicocele:  None visualized.  Pulsed Doppler interrogation of both testes demonstrates normal low resistance arterial and venous waveforms bilaterally.  IMPRESSION: 1. No evidence of testicular torsion. 2. Bilateral microlithiasis. Current literature suggests that testicular microlithiasis is not a significant independent risk factor for development of testicular carcinoma, and  that follow up imaging is not warranted in the absence of other risk factors. Monthly testicular self-examination and annual physical exams are considered appropriate surveillance. If patient has other risk factors for testicular carcinoma, then referral to Urology should be considered. (Reference: DeCastro, et al.: A 5-Year Follow up Study of Asymptomatic Men with Testicular Microlithiasis. J Urol 2008; 179:1420-1423.) 3. Large hydroceles right larger than left, complex.   Electronically Signed   By: Esperanza Heir M.D.   On: 09/16/2014 19:43   [place] Art/ven Flow Abd Pelv Doppler  09/16/2014   CLINICAL DATA:  Right testicular pain and swelling which began yesterday  EXAM: SCROTAL ULTRASOUND  DOPPLER ULTRASOUND OF THE TESTICLES  TECHNIQUE: Complete ultrasound examination of the testicles, epididymis, and other scrotal structures was performed. Color and spectral Doppler ultrasound were also utilized to evaluate blood flow to the testicles.  COMPARISON:  None.  FINDINGS: Right testicle  Measurements: 33 x 32 x 37 mm.  Microlithiasis.  No mass.  Left testicle  Measurements:  40 x 25 x 24 mm.  Extensive microlithiasis.  No mass.  Right epididymis: No significant abnormalities. Tiny epididymal head cyst.  Left epididymis:  Tiny epididymal head cyst.  Hydrocele: Very large right hydrocele with debris. Possible loculation within the hydrocele. Significant left hydrocele as well.  Varicocele:  None visualized.  Pulsed Doppler interrogation of both testes demonstrates normal low resistance arterial and venous waveforms bilaterally.  IMPRESSION: 1.  No evidence of testicular torsion. 2. Bilateral microlithiasis. Current literature suggests that testicular microlithiasis is not a significant independent risk factor for development of testicular carcinoma, and that follow up imaging is not warranted in the absence of other risk factors. Monthly testicular self-examination and annual physical exams are considered appropriate  surveillance. If patient has other risk factors for testicular carcinoma, then referral to Urology should be considered. (Reference: DeCastro, et al.: A 5-Year Follow up Study of Asymptomatic Men with Testicular Microlithiasis. J Urol 2008; 179:1420-1423.) 3. Large hydroceles right larger than left, complex.   Electronically Signed   By: Esperanza Heir M.D.   On: 09/16/2014 19:43     EKG Interpretation None     CD 4 count of 880/VL<12 May 2014 pe rmost recent ID note  MDM   Final diagnoses:  Tinea pedis of left foot  Left hydrocele    Pt is a 64 y.o. male with Pmhx as above who presents with continued itching, now also with foul smell and some skin breakdown between toes since being diagnosed with tenia pedis 10 days ago. Pt states he used lotramin for 1 week w/o improvement. He has also had recurrent L testicular pain since last night w/o urinary symptoms. Will get US scrotum, will place nystain powder. We discussed foot hygiene. He wears rubber boots at work.   Foot exam c/wcontiued tenia pedis, has not likely completed long enough coarse and not keeping feet dry. UA with BL hydroceles, R>L. Will rec supportive care for hydroceles, spray antifungal powder, changing of socks at work.      Toy Cookey, MD 09/17/14 1233

## 2014-09-16 NOTE — ED Notes (Signed)
US at bedside

## 2014-09-16 NOTE — Discharge Instructions (Signed)
Hydrocele, Adult Fluid can collect around the testicles. This fluid forms in a sac. This condition is called a hydrocele. The collected fluid causes swelling of the scrotum. Usually, it affects just one testicle. Most of the time, the condition does not cause pain. Sometimes, the hydrocele goes away on its own. Other times, surgery is needed to get rid of the fluid. CAUSES A hydrocele does not develop often. Different things can cause a hydrocele in a man, including:  Injury to the scrotum.  Infection.  X-ray of the area around the scrotum.  A tumor or cancer of the testicle.  Twisting of a testicle.  Decreased blood flow to the scrotum. SYMPTOMS   Swelling without pain. The hydrocele feels like a water-filled balloon.  Swelling with pain. This can occur if the hydrocele was caused by infection or twisting.  Mild discomfort in the scrotum.  The hydrocele may feel heavy.  Swelling that gets smaller when you lie down. DIAGNOSIS  Your caregiver will do a physical exam to decide if you have a hydrocele. This may include:  Asking questions about your overall health, today and in the past. Your caregiver may ask about any injuries, X-rays, or infections.  Pushing on your abdomen or asking you to change positions to see if the size of the hydrocele changes.  Shining a light through the scrotum (transillumination) to see if the fluid inside the scrotum is clear.  Blood tests and urine tests to check for infection.  Imaging studies that take pictures of the scrotum and testicles. TREATMENT  Treatment depends in part on what caused the condition. Options include:  Watchful waiting. Your caregiver checks the hydrocele every so often.  Different surgeries to drain the fluid.  A needle may be put into the scrotum to drain fluid (needle aspiration). Fluid often returns after this type of treatment.  A cut (incision) may be made in the scrotum to remove the fluid sac  (hydrocelectomy).  An incision may be made in the groin to repair a hydrocele that has contact with abdominal fluids (communicating hydrocele).  Medicines to treat an infection (antibiotics). HOME CARE INSTRUCTIONS  What you need to do at home may depend on the cause of the hydrocele and type of treatment. In general:  Take all medicine as directed by your caregiver. Follow the directions carefully.  Ask your caregiver if there is anything you should not do while you recover (activities, lifting, work, sex).  If you had surgery to repair a communicating hydrocele, recovery time may vary. Ask you caregiver about your recovery time.  Avoid heavy lifting for 4 to 6 weeks.  If you had an incision on the scrotum or groin, wash it for 2 to 3 days after surgery. Do this as long as the skin is closed and there are no gaps in the wound. Wash gently, and avoid rubbing the incision.  Keep all follow-up appointments. SEEK MEDICAL CARE IF:   Your scrotum seems to be getting larger.  The area becomes more and more uncomfortable. SEEK IMMEDIATE MEDICAL CARE IF:  You have a fever. Document Released: 08/27/2009 Document Revised: 12/28/2012 Document Reviewed: 08/27/2009 [facility] Patient Information 2015 [place], [place]. This information is not intended to replace advice given to you by your health care provider. Make sure you discuss any questions you have with your health care provider.   Athlete's Foot Athlete's foot (tinea pedis) is a fungal infection of the skin on the feet. It often occurs on the skin between the  toes or underneath the toes. It can also occur on the soles of the feet. Athlete's foot is more likely to occur in hot, humid weather. Not washing your feet or changing your socks often enough can contribute to athlete's foot. The infection can spread from person to person (contagious). CAUSES Athlete's foot is caused by a fungus. This fungus thrives in warm, moist places. Most people  get athlete's foot by sharing shower stalls, towels, and wet floors with an infected person. People with weakened immune systems, including those with diabetes, may be more likely to get athlete's foot. SYMPTOMS   Itchy areas between the toes or on the soles of the feet.  White, flaky, or scaly areas between the toes or on the soles of the feet.  Tiny, intensely itchy blisters between the toes or on the soles of the feet.  Tiny cuts on the skin. These cuts can develop a bacterial infection.  Thick or discolored toenails. DIAGNOSIS  Your caregiver can usually tell what the problem is by doing a physical exam. Your caregiver may also take a skin sample from the rash area. The skin sample may be examined under a microscope, or it may be tested to see if fungus will grow in the sample. A sample may also be taken from your toenail for testing. TREATMENT  Over-the-counter and prescription medicines can be used to kill the fungus. These medicines are available as powders or creams. Your caregiver can suggest medicines for you. Fungal infections respond slowly to treatment. You may need to continue using your medicine for several weeks. PREVENTION   Do not share towels.  Wear sandals in wet areas, such as shared locker rooms and shared showers.  Keep your feet dry. Wear shoes that allow air to circulate. Wear cotton or wool socks. HOME CARE INSTRUCTIONS   Take medicines as directed by your caregiver. Do not use steroid creams on athlete's foot.  Keep your feet clean and cool. Wash your feet daily and dry them thoroughly, especially between your toes.  Change your socks every day. Wear cotton or wool socks. In hot climates, you may need to change your socks 2 to 3 times per day.  Wear sandals or canvas tennis shoes with good air circulation.  If you have blisters, soak your feet in Burow's solution or Epsom salts for 20 to 30 minutes, 2 times a day to dry out the blisters. Make sure you dry  your feet thoroughly afterward. SEEK MEDICAL CARE IF:   You have a fever.  You have swelling, soreness, warmth, or redness in your foot.  You are not getting better after 7 days of treatment.  You are not completely cured after 30 days.  You have any problems caused by your medicines. MAKE SURE YOU:   Understand these instructions.  Will watch your condition.  Will get help right away if you are not doing well or get worse. Document Released: 03/06/2000 Document Revised: 06/01/2011 Document Reviewed: 12/26/2010 [facility] Patient Information 2015 [place], [place]. This information is not intended to replace advice given to you by your health care provider. Make sure you discuss any questions you have with your health care provider.

## 2014-09-16 NOTE — ED Notes (Signed)
Pt advised to follow up with MD Drue Second in 1 week.  He verbalizes understanding.  His friend is driving him home. He is alert, oriented, and ambulatory upon DC.

## 2014-09-16 NOTE — ED Notes (Signed)
Patient states that he has open areas between his toes on his left toes and drainage has a foul oder. Patient states his doctor told his to come immediately if he had foot wounds due to his diabetes. Patient also c/o right groin pain and swelling that began last night.

## 2014-09-25 ENCOUNTER — Ambulatory Visit: Payer: 59

## 2014-09-28 ENCOUNTER — Ambulatory Visit: Payer: 59 | Admitting: Podiatry

## 2014-10-04 ENCOUNTER — Other Ambulatory Visit: Payer: Self-pay | Admitting: *Deleted

## 2014-10-04 DIAGNOSIS — B20 Human immunodeficiency virus [HIV] disease: Secondary | ICD-10-CM

## 2014-10-04 DIAGNOSIS — Z113 Encounter for screening for infections with a predominantly sexual mode of transmission: Secondary | ICD-10-CM

## 2014-10-17 ENCOUNTER — Telehealth: Payer: Self-pay | Admitting: *Deleted

## 2014-10-17 NOTE — Telephone Encounter (Signed)
Patient had appt at [facility] for foot pain and he did not keep the appt. 09/28/14 will let Dr Drue Second know. He has also been to the ED since his last visit with [place] for the same issue.

## 2014-11-03 ENCOUNTER — Other Ambulatory Visit: Payer: Self-pay | Admitting: Internal Medicine

## 2014-11-05 ENCOUNTER — Other Ambulatory Visit: Payer: Self-pay | Admitting: *Deleted

## 2014-11-05 DIAGNOSIS — I10 Essential (primary) hypertension: Secondary | ICD-10-CM

## 2014-11-05 MED ORDER — LISINOPRIL 20 MG PO TABS: ORAL_TABLET | ORAL | Status: DC

## 2014-11-05 MED ORDER — HYDROCHLOROTHIAZIDE 25 MG PO TABS: 25.0000 mg | ORAL_TABLET | Freq: Every day | ORAL | Status: DC

## 2014-11-21 ENCOUNTER — Other Ambulatory Visit (INDEPENDENT_AMBULATORY_CARE_PROVIDER_SITE_OTHER): Payer: Self-pay

## 2014-11-21 DIAGNOSIS — Z113 Encounter for screening for infections with a predominantly sexual mode of transmission: Secondary | ICD-10-CM

## 2014-11-21 DIAGNOSIS — Z79899 Other long term (current) drug therapy: Secondary | ICD-10-CM

## 2014-11-21 DIAGNOSIS — B20 Human immunodeficiency virus [HIV] disease: Secondary | ICD-10-CM

## 2014-11-21 LAB — COMPLETE METABOLIC PANEL WITH GFR
ALBUMIN: 4 g/dL (ref 3.6–5.1)
ALK PHOS: 65 U/L (ref 40–115)
ALT: 24 U/L (ref 9–46)
AST: 25 U/L (ref 10–35)
BUN: 18 mg/dL (ref 7–25)
CO2: 25 mmol/L (ref 20–31)
Calcium: 9.5 mg/dL (ref 8.6–10.3)
Chloride: 105 mmol/L (ref 98–110)
Creat: 1.38 mg/dL — ABNORMAL HIGH (ref 0.70–1.25)
GFR, EST NON AFRICAN AMERICAN: 54 mL/min — AB (ref >=60)
GFR, Est African American: 63 mL/min (ref >=60)
GLUCOSE: 94 mg/dL (ref 65–99)
POTASSIUM: 3.8 mmol/L (ref 3.5–5.3)
SODIUM: 139 mmol/L (ref 135–146)
TOTAL PROTEIN: 7.5 g/dL (ref 6.1–8.1)
Total Bilirubin: 0.6 mg/dL (ref 0.2–1.2)

## 2014-11-21 LAB — CBC WITH DIFFERENTIAL/PLATELET
BASOS PCT: 1 % (ref 0–1)
Basophils Absolute: 0 10*3/uL (ref 0.0–0.1)
EOS ABS: 0.2 10*3/uL (ref 0.0–0.7)
Eosinophils Relative: 4 % (ref 0–5)
HCT: 41.4 % (ref 39.0–52.0)
HEMOGLOBIN: 14.9 g/dL (ref 13.0–17.0)
Lymphocytes Relative: 49 % — ABNORMAL HIGH (ref 12–46)
Lymphs Abs: 2.1 10*3/uL (ref 0.7–4.0)
MCH: 32.4 pg (ref 26.0–34.0)
MCHC: 36 g/dL (ref 30.0–36.0)
MCV: 90 fL (ref 78.0–100.0)
MPV: 9.4 fL (ref 8.6–12.4)
Monocytes Absolute: 0.4 10*3/uL (ref 0.1–1.0)
Monocytes Relative: 9 % (ref 3–12)
NEUTROS ABS: 1.6 10*3/uL — AB (ref 1.7–7.7)
NEUTROS PCT: 37 % — AB (ref 43–77)
PLATELETS: 289 10*3/uL (ref 150–400)
RBC: 4.6 MIL/uL (ref 4.22–5.81)
RDW: 14.1 % (ref 11.5–15.5)
WBC: 4.3 10*3/uL (ref 4.0–10.5)

## 2014-11-21 LAB — LIPID PANEL
CHOL/HDL RATIO: 7.2 ratio — AB (ref <=5.0)
Cholesterol: 201 mg/dL — ABNORMAL HIGH (ref 125–200)
HDL: 28 mg/dL — AB (ref >=40)
LDL CALC: 148 mg/dL — AB (ref <130)
TRIGLYCERIDES: 124 mg/dL (ref <150)
VLDL: 25 mg/dL (ref <30)

## 2014-11-22 LAB — HIV-1 RNA QUANT-NO REFLEX-BLD
HIV 1 RNA QUANT: 583 {copies}/mL — AB (ref <20)
HIV-1 RNA Quant, Log: 2.77 {Log} — ABNORMAL HIGH (ref <1.30)

## 2014-11-22 LAB — RPR

## 2014-11-22 LAB — T-HELPER CELL (CD4) - (RCID CLINIC ONLY)
CD4 % Helper T Cell: 43 % (ref 33–55)
CD4 T CELL ABS: 870 /uL (ref 400–2700)

## 2014-12-06 ENCOUNTER — Ambulatory Visit: Payer: 59 | Admitting: Internal Medicine

## 2014-12-10 NOTE — Progress Notes (Signed)
[facility] notified via fax. Howell, Michelle M, RN   

## 2015-01-15 ENCOUNTER — Ambulatory Visit (INDEPENDENT_AMBULATORY_CARE_PROVIDER_SITE_OTHER): Payer: Self-pay | Admitting: Internal Medicine

## 2015-01-15 ENCOUNTER — Encounter: Payer: Self-pay | Admitting: Internal Medicine

## 2015-01-15 VITALS — BP 139/81 | HR 72 | Temp 97.6°F | Wt 175.0 lb

## 2015-01-15 DIAGNOSIS — Z23 Encounter for immunization: Secondary | ICD-10-CM

## 2015-01-15 DIAGNOSIS — I10 Essential (primary) hypertension: Secondary | ICD-10-CM

## 2015-01-15 DIAGNOSIS — B20 Human immunodeficiency virus [HIV] disease: Secondary | ICD-10-CM

## 2015-01-15 DIAGNOSIS — B182 Chronic viral hepatitis C: Secondary | ICD-10-CM

## 2015-01-15 LAB — BASIC METABOLIC PANEL
BUN: 18 mg/dL (ref 7–25)
CALCIUM: 9.9 mg/dL (ref 8.6–10.3)
CO2: 22 mmol/L (ref 20–31)
Chloride: 104 mmol/L (ref 98–110)
Creat: 1.26 mg/dL — ABNORMAL HIGH (ref 0.70–1.25)
Glucose, Bld: 92 mg/dL (ref 65–99)
Potassium: 3.7 mmol/L (ref 3.5–5.3)
SODIUM: 139 mmol/L (ref 135–146)

## 2015-01-15 NOTE — Progress Notes (Signed)
Patient ID: Jamie Clark, male   DOB: 06/23/1950, 64 y.o.   MRN: [id number]       Patient ID: Jamie Clark, male   DOB: 09/15/1950, 64 y.o.   MRN: [id number]  HPI 63yo M with HIV disease, CD 4 count of 870/VL 583 on triumeq ( in August). Also has chronic hep C without hepatic coma. He finished 12 wk course of harvoni at end of June He was supposed to get SVR at end of September  Outpatient Encounter Prescriptions as of 01/15/2015  Medication Sig  . Abacavir-Dolutegravir-Lamivud (TRIUMEQ) 600-50-300 MG TABS Take 1 tablet by mouth daily.  . hydrochlorothiazide (HYDRODIURIL) 25 MG tablet Take 1 tablet (25 mg total) by mouth at bedtime.  [name]. lisinopril (PRINIVIL,ZESTRIL) 20 MG tablet TAKE 1/2 TABLET BY MOUTH DAILY UNTIL NEXT APPOINTMENT OR 1 TABLET DAILY AS DIRECTED  . Miconazole Nitrate 2 % AERP Apply 1 application topically 2 (two) times daily. For 2 weeks  . terbinafine (LAMISIL AT) 1 % cream Apply 1 application topically 2 (two) times daily. To feet  . [DISCONTINUED] Ledipasvir-Sofosbuvir (HARVONI) 90-400 MG TABS Take 1 tablet by mouth daily.   No facility-administered encounter medications on file as of 01/15/2015.     Patient Active Problem List   Diagnosis Date Noted  . Acute pain of left foot 04/30/2014  . Hyperglycemia 04/30/2014  . Cellulitis and abscess of left foot 04/30/2014  . Acute renal failure (HCC) 04/30/2014  . Tinea pedis of left foot 10/23/2013  . Phlebitis 04/01/2011    Class: History of  . INSOMNIA 04/14/2010  . Dyslipidemia 01/17/2009  . HTN (hypertension) 01/17/2009  . HIV infection (HCC) 12/18/2008  . Chronic viral hepatitis C (HCC) 07/13/2007     Health Maintenance Due  Topic Date Due  . TETANUS/TDAP  01/22/1971  . COLONOSCOPY  01/21/2002  . ZOSTAVAX  01/22/2012  . INFLUENZA VACCINE  10/22/2014     Review of Systems  Physical Exam   BP 139/81 mmHg  Pulse 72  Temp(Src) 97.6 F (36.4 C) (Oral)  Wt 175 lb (79.379 kg)  Lab Results    Component Value Date   CD4TCELL 43 11/21/2014   Lab Results  Component Value Date   CD4TABS 870 11/21/2014   CD4TABS 980 09/06/2014   CD4TABS 880 05/01/2014   Lab Results  Component Value Date   HIV1RNAQUANT 583* 11/21/2014   Lab Results  Component Value Date   HEPBSAB NEG 05/06/2010   No results found for: RPR  CBC Lab Results  Component Value Date   WBC 4.3 11/21/2014   RBC 4.60 11/21/2014   HGB 14.9 11/21/2014   HCT 41.4 11/21/2014   PLT 289 11/21/2014   MCV 90.0 11/21/2014   MCH 32.4 11/21/2014   MCHC 36.0 11/21/2014   RDW 14.1 11/21/2014   LYMPHSABS 2.1 11/21/2014   MONOABS 0.4 11/21/2014   EOSABS 0.2 11/21/2014   BASOSABS 0.0 11/21/2014   BMET Lab Results  Component Value Date   NA 139 11/21/2014   K 3.8 11/21/2014   CL 105 11/21/2014   CO2 25 11/21/2014   GLUCOSE 94 11/21/2014   BUN 18 11/21/2014   CREATININE 1.38* 11/21/2014   CALCIUM 9.5 11/21/2014   GFRNONAA 54* 11/21/2014   GFRAA 63 11/21/2014     Assessment and Plan  hiv disease= detectable viral load in setting of not missing doses per his report. Concern that he may be developing resistance. We will check hiv viral load with reflex genotype  ckd 3 = will  check cr function   Chronic hep c without hepatic coma = will check hcv vl. Will get u/s once his insurance come  Hypertension= well controlled, continue on current regimen  Right foot callus = recommended he uses insoles in shoes, if worsens, may refer to podiatry

## 2015-01-16 LAB — HIV-1 RNA ULTRAQUANT REFLEX TO GENTYP+

## 2015-01-16 LAB — HEPATITIS C RNA QUANTITATIVE: HCV Quantitative: NOT DETECTED IU/mL (ref <15)

## 2015-02-02 LAB — HIV-1 INTEGRASE GENOTYPE

## 2015-02-03 ENCOUNTER — Emergency Department (HOSPITAL_COMMUNITY): Payer: 59

## 2015-02-03 ENCOUNTER — Encounter (HOSPITAL_COMMUNITY): Payer: Self-pay | Admitting: *Deleted

## 2015-02-03 ENCOUNTER — Emergency Department (HOSPITAL_COMMUNITY)
Admission: EM | Admit: 2015-02-03 | Discharge: 2015-02-03 | Disposition: A | Discharge status: Home / Self Care | Payer: 59 | Attending: Emergency Medicine | Admitting: Emergency Medicine

## 2015-02-03 DIAGNOSIS — Z8619 Personal history of other infectious and parasitic diseases: Secondary | ICD-10-CM | POA: Insufficient documentation

## 2015-02-03 DIAGNOSIS — R110 Nausea: Secondary | ICD-10-CM | POA: Insufficient documentation

## 2015-02-03 DIAGNOSIS — Z720 Tobacco use: Secondary | ICD-10-CM | POA: Insufficient documentation

## 2015-02-03 DIAGNOSIS — H539 Unspecified visual disturbance: Secondary | ICD-10-CM | POA: Insufficient documentation

## 2015-02-03 DIAGNOSIS — I10 Essential (primary) hypertension: Secondary | ICD-10-CM | POA: Insufficient documentation

## 2015-02-03 DIAGNOSIS — R531 Weakness: Secondary | ICD-10-CM | POA: Insufficient documentation

## 2015-02-03 DIAGNOSIS — E119 Type 2 diabetes mellitus without complications: Secondary | ICD-10-CM | POA: Insufficient documentation

## 2015-02-03 DIAGNOSIS — J069 Acute upper respiratory infection, unspecified: Secondary | ICD-10-CM | POA: Insufficient documentation

## 2015-02-03 DIAGNOSIS — B9789 Other viral agents as the cause of diseases classified elsewhere: Secondary | ICD-10-CM

## 2015-02-03 DIAGNOSIS — Z21 Asymptomatic human immunodeficiency virus [HIV] infection status: Secondary | ICD-10-CM | POA: Insufficient documentation

## 2015-02-03 DIAGNOSIS — Z79899 Other long term (current) drug therapy: Secondary | ICD-10-CM | POA: Insufficient documentation

## 2015-02-03 LAB — CBC WITH DIFFERENTIAL/PLATELET
BASOS ABS: 0 10*3/uL (ref 0.0–0.1)
BASOS PCT: 0 %
EOS ABS: 0.2 10*3/uL (ref 0.0–0.7)
Eosinophils Relative: 2 %
HEMATOCRIT: 44.8 % (ref 39.0–52.0)
HEMOGLOBIN: 15 g/dL (ref 13.0–17.0)
Lymphocytes Relative: 28 %
Lymphs Abs: 2 10*3/uL (ref 0.7–4.0)
MCH: 31.1 pg (ref 26.0–34.0)
MCHC: 33.5 g/dL (ref 30.0–36.0)
MCV: 92.9 fL (ref 78.0–100.0)
MONO ABS: 0.9 10*3/uL (ref 0.1–1.0)
Monocytes Relative: 12 %
NEUTROS ABS: 4.1 10*3/uL (ref 1.7–7.7)
NEUTROS PCT: 58 %
Platelets: 207 10*3/uL (ref 150–400)
RBC: 4.82 MIL/uL (ref 4.22–5.81)
RDW: 12.8 % (ref 11.5–15.5)
WBC: 7.2 10*3/uL (ref 4.0–10.5)

## 2015-02-03 LAB — BASIC METABOLIC PANEL
ANION GAP: 8 (ref 5–15)
BUN: 11 mg/dL (ref 6–20)
CALCIUM: 9 mg/dL (ref 8.9–10.3)
CO2: 24 mmol/L (ref 22–32)
CREATININE: 1.28 mg/dL — AB (ref 0.61–1.24)
Chloride: 105 mmol/L (ref 101–111)
GFR calc Af Amer: >60 mL/min (ref >60)
GFR calc non Af Amer: 58 mL/min — ABNORMAL LOW (ref >60)
Glucose, Bld: 86 mg/dL (ref 65–99)
Potassium: 4 mmol/L (ref 3.5–5.1)
SODIUM: 137 mmol/L (ref 135–145)

## 2015-02-03 LAB — INFLUENZA PANEL BY PCR (TYPE A & B)
H1N1 flu by pcr: NOT DETECTED
INFLBPCR: NEGATIVE
Influenza A By PCR: NEGATIVE

## 2015-02-03 MED ORDER — OXYMETAZOLINE HCL 0.05 % NA SOLN: 1.0000 | Freq: Two times a day (BID) | NASAL | Status: DC

## 2015-02-03 MED ORDER — BENZONATATE 100 MG PO CAPS: 200.0000 mg | ORAL_CAPSULE | Freq: Two times a day (BID) | ORAL | Status: DC | PRN

## 2015-02-03 MED ORDER — ONDANSETRON 4 MG PO TBDP
8.0000 mg | ORAL_TABLET | Freq: Once | ORAL | Status: AC
  Administered 2015-02-03: 8 mg via ORAL
  Filled 2015-02-03: qty 2

## 2015-02-03 MED ORDER — ONDANSETRON 4 MG PO TBDP: 4.0000 mg | ORAL_TABLET | Freq: Three times a day (TID) | ORAL | Status: DC | PRN

## 2015-02-03 NOTE — ED Provider Notes (Signed)
CSN: [id number]     Arrival date & time 02/03/15  0827 History   First MD Initiated Contact with Patient 02/03/15 [phone]     Chief Complaint  Patient presents with  . Headache  . Nausea     (Consider location/radiation/quality/duration/timing/severity/associated sxs/prior Treatment) HPI   Patient is a 64 year old male with past medical history of HTN, HIV, hepatitis C, DM who presents to the emergency department with complaint of flulike symptoms, onset 2 days. Patient reports having chest congestion, nausea, sore throat, sinus congestion, productive cough and weakness. He also endorses having a headache at his right frontal/maxillary sinus pressure that is constant, no aggravating or alleviating factors. Pt denies fever, neck stiffness, visual changes, photophobia, abdominal pain, N/V, urinary symptoms, numbness, tingling, seizures, syncope.  Denies chills, nasal congestion, rhinorrhea, SOB, wheezing, CP, diarrhea, constipation. Patient reports he received his flu shot 3 weeks ago by his PCP. Patient reports followed by ID (Dr. Drue Second) for his HIV and his last CD4 count on 8/31 was 870, HIV1 RNA quant <20 on 10/25. Denies any sick contacts.   Past Medical History  Diagnosis Date  . Hypertension   . HIV positive (HCC)   . Hepatitis C, chronic (HCC)   . Diabetes mellitus without complication (HCC)    History reviewed. No pertinent past surgical history. No family history on file. Social History  Substance Use Topics  . Smoking status: Current Every Day Smoker -- 0.15 packs/day    Types: Cigarettes  . Smokeless tobacco: Never Used     Comment: not ready to quit - cutting back  . Alcohol Use: No    Review of Systems  HENT: Positive for congestion, sinus pressure and sore throat.   Eyes: Positive for visual disturbance.  Respiratory: Positive for cough.   Gastrointestinal: Positive for nausea.  Neurological: Positive for weakness and headaches.  All other systems reviewed and are  negative.     Allergies  Review of patient's allergies indicates no known allergies.  Home Medications   Prior to Admission medications   Medication Sig Start Date End Date Taking? Authorizing Provider  Abacavir-Dolutegravir-Lamivud (TRIUMEQ) 600-50-300 MG TABS Take 1 tablet by mouth daily. 08/06/14  Yes Judyann Munson, MD  hydrochlorothiazide (HYDRODIURIL) 25 MG tablet Take 1 tablet (25 mg total) by mouth at bedtime. 11/05/14  Yes Judyann Munson, MD  lisinopril (PRINIVIL,ZESTRIL) 20 MG tablet TAKE 1/2 TABLET BY MOUTH DAILY UNTIL NEXT APPOINTMENT OR 1 TABLET DAILY AS DIRECTED 11/05/14  Yes Judyann Munson, MD  benzonatate (TESSALON) 100 MG capsule Take 2 capsules (200 mg total) by mouth 2 (two) times daily as needed for cough. 02/03/15   Barrett Henle, PA-C  Miconazole Nitrate 2 % AERP Apply 1 application topically 2 (two) times daily. For 2 weeks 09/16/14   Toy Cookey, MD  ondansetron (ZOFRAN ODT) 4 MG disintegrating tablet Take 1 tablet (4 mg total) by mouth every 8 (eight) hours as needed for nausea or vomiting. 02/03/15   Barrett Henle, PA-C  oxymetazoline (AFRIN NASAL SPRAY) 0.05 % nasal spray Place 1 spray into both nostrils 2 (two) times daily. 02/03/15   Barrett Henle, PA-C  terbinafine (LAMISIL AT) 1 % cream Apply 1 application topically 2 (two) times daily. To feet 09/06/14   Judyann Munson, MD   BP 106/93 mmHg  Pulse 71  Temp(Src) 98.6 F (37 C) (Oral)  Resp 18  Ht  (1.702 m)  Wt 175 lb (79.379 kg)  BMI 27.40 kg/m2  SpO2 97% Physical Exam  Constitutional: He is oriented to person, place, and time. He appears well-developed and well-nourished. No distress.  HENT:  Head: Normocephalic and atraumatic.  Right Ear: Tympanic membrane normal.  Left Ear: Tympanic membrane normal.  Nose: Right sinus exhibits maxillary sinus tenderness. Right sinus exhibits no frontal sinus tenderness. Left sinus exhibits no maxillary sinus tenderness and no  frontal sinus tenderness.  Mouth/Throat: Uvula is midline, oropharynx is clear and moist and mucous membranes are normal. No trismus in the jaw. No uvula swelling. No oropharyngeal exudate, posterior oropharyngeal edema, posterior oropharyngeal erythema or tonsillar abscesses.  Eyes: Conjunctivae and EOM are normal. Pupils are equal, round, and reactive to light. Right eye exhibits no discharge. Left eye exhibits no discharge. No scleral icterus.  Neck: Normal range of motion. Neck supple.  Cardiovascular: Normal rate, regular rhythm, normal heart sounds and intact distal pulses.   Pulmonary/Chest: Effort normal and breath sounds normal. No respiratory distress. He has no wheezes. He has no rales. He exhibits no tenderness.  Abdominal: Soft. Bowel sounds are normal. He exhibits no distension and no mass. There is no tenderness. There is no rebound and no guarding.  Musculoskeletal: Normal range of motion. He exhibits no edema.  Lymphadenopathy:    He has cervical adenopathy (tender bilateral submandibular lymphadenopathy).  Neurological: He is alert and oriented to person, place, and time. He has normal strength. No cranial nerve deficit or sensory deficit. Coordination normal.  Skin: Skin is warm and dry. He is not diaphoretic.  Nursing note and vitals reviewed.   ED Course  Procedures (including critical care time) Labs Review Labs Reviewed  BASIC METABOLIC PANEL - Abnormal; Notable for the following:    Creatinine, Ser 1.28 (*)    GFR calc non Af Amer 58 (*)    All other components within normal limits  CBC WITH DIFFERENTIAL/PLATELET  INFLUENZA PANEL BY PCR (TYPE A & B, H1N1)    Imaging Review Dg Chest 2 View  02/03/2015  CLINICAL DATA:  Headache, congestion, nausea, sore throat since Friday. Flu-like symptoms. Possible sinus infection. EXAM: CHEST  2 VIEW COMPARISON:  Chest x-ray dated 04/30/2014. FINDINGS: Heart size is normal. Overall cardiomediastinal silhouette is stable in size  and configuration. There is mild prominence of the pulmonary arteries bilaterally suspicious for some degree of pulmonary artery hypertension. There appear to be trace pleural effusions bilaterally. Lungs otherwise clear. IMPRESSION: 1. Trace pleural effusions bilaterally. 2. Otherwise unremarkable chest x-ray. No evidence of pneumonia. Heart size is normal. Electronically Signed   By: Bary Richard M.D.   On: 02/03/2015 10:16   I have personally reviewed and evaluated these images and lab results as part of my medical decision-making.  Filed Vitals:   02/03/15 0930  BP: 106/93  Pulse: 71  Temp:   Resp:      MDM   Final diagnoses:  Viral URI with cough    Patient presents with chest congestion, nausea, sore throat, cough, headache. History of HIV, hep C DM. VSS. Exam revealed right maxillary TTP, mildly tender submandibular cervical lymphadenopathy. Lungs CTAP. Abdominal exam benign. Patient given Zofran. Chest x-ray revealed trace bilateral pleural effusions, otherwise unremarkable. Labs unremarkable. Influenza panel negative. I suspect pt's sxs are likely due to viral URI. Plan to discharge patient home with nasal decongestion, antitussive and antiemetics and follow up with his PCP. Patient reports he has no appointment scheduled with his PCP in 2 days.  Evaluation does not show pathology requring ongoing emergent intervention or admission. Pt is hemodynamically stable and  mentating appropriately. Discussed findings/results and plan with patient/guardian, who agrees with plan. All questions answered. Return precautions discussed and outpatient follow up given.      Satira Sarkicole Elizabeth FairviewNadeau, [place] JerseyPA-C 02/03/15 1223  Laurence Spatesachel Morgan Little, MD 02/03/15 1245

## 2015-02-03 NOTE — ED Notes (Signed)
pt reports headache, congestion, nausea, and sore throat since Friday.

## 2015-02-03 NOTE — Discharge Instructions (Signed)
Take your medications as prescribed. Follow-up with your primary care provider at your scheduled appointment on Tuesday. Please return to the Emergency Department if symptoms worsen or new onset of fever, difficulty breathing, chest pain, abdominal pain, vomiting, diarrhea, blood in emesis or stool.

## 2015-02-05 ENCOUNTER — Encounter: Payer: Self-pay | Admitting: Internal Medicine

## 2015-02-05 ENCOUNTER — Ambulatory Visit (INDEPENDENT_AMBULATORY_CARE_PROVIDER_SITE_OTHER): Payer: 59 | Admitting: Internal Medicine

## 2015-02-05 VITALS — BP 106/70 | HR 75 | Temp 97.8°F | Wt 173.0 lb

## 2015-02-05 DIAGNOSIS — N182 Chronic kidney disease, stage 2 (mild): Secondary | ICD-10-CM

## 2015-02-05 DIAGNOSIS — B20 Human immunodeficiency virus [HIV] disease: Secondary | ICD-10-CM

## 2015-02-05 DIAGNOSIS — J0120 Acute ethmoidal sinusitis, unspecified: Secondary | ICD-10-CM

## 2015-02-05 NOTE — Progress Notes (Signed)
Patient ID: Jamie Clark, male   DOB: 08-23-1950, 64 y.o.   MRN: [id number]       Patient ID: Jamie Clark, male   DOB: November 22, 1950, 64 y.o.   MRN: [id number]  HPI 64yo M with HIV disease, CD 4 count of 870/VL<20 (oct 2016) doing well with meds. He went to UC/ED to evaluate sinusitis. He was ruled out for flu and given afrin to help with his symptoms.. Which is improving.  Outpatient Encounter Prescriptions as of 02/05/2015  Medication Sig  . Abacavir-Dolutegravir-Lamivud (TRIUMEQ) 600-50-300 MG TABS Take 1 tablet by mouth daily.  . hydrochlorothiazide (HYDRODIURIL) 25 MG tablet Take 1 tablet (25 mg total) by mouth at bedtime.  [name] lisinopril (PRINIVIL,ZESTRIL) 20 MG tablet TAKE 1/2 TABLET BY MOUTH DAILY UNTIL NEXT APPOINTMENT OR 1 TABLET DAILY AS DIRECTED  . ondansetron (ZOFRAN ODT) 4 MG disintegrating tablet Take 1 tablet (4 mg total) by mouth every 8 (eight) hours as needed for nausea or vomiting.  [name] oxymetazoline (AFRIN NASAL SPRAY) 0.05 % nasal spray Place 1 spray into both nostrils 2 (two) times daily.  [name] terbinafine (LAMISIL AT) 1 % cream Apply 1 application topically 2 (two) times daily. To feet  . [DISCONTINUED] Miconazole Nitrate 2 % AERP Apply 1 application topically 2 (two) times daily. For 2 weeks  . [DISCONTINUED] benzonatate (TESSALON) 100 MG capsule Take 2 capsules (200 mg total) by mouth 2 (two) times daily as needed for cough.   No facility-administered encounter medications on file as of 02/05/2015.     Patient Active Problem List   Diagnosis Date Noted  . Acute pain of left foot 04/30/2014  . Hyperglycemia 04/30/2014  . Cellulitis and abscess of left foot 04/30/2014  . Acute renal failure (HCC) 04/30/2014  . Tinea pedis of left foot 10/23/2013  . Phlebitis 04/01/2011    Class: History of  . INSOMNIA 04/14/2010  . Dyslipidemia 01/17/2009  . HTN (hypertension) 01/17/2009  . HIV infection (HCC) 12/18/2008  . Chronic viral hepatitis C (HCC) 07/13/2007      Health Maintenance Due  Topic Date Due  . TETANUS/TDAP  01/22/1971  . COLONOSCOPY  01/21/2002  . ZOSTAVAX  01/22/2012     Review of Systems + nasal sinus congestion, tenderness to maxillary sinus, post nasal drip Physical Exam   BP 106/70 mmHg  Pulse 75  Temp(Src) 97.8 F (36.6 C) (Oral)  Wt 173 lb (78.472 kg) Physical Exam  Constitutional: He is oriented to person, place, and time. He appears well-developed and well-nourished. No distress.  HENT:  Mouth/Throat: Oropharynx is clear and moist. No oropharyngeal exudate.  Cardiovascular: Normal rate, regular rhythm and normal heart sounds. Exam reveals no gallop and no friction rub.  No murmur heard.  Pulmonary/Chest: Effort normal and breath sounds normal. No respiratory distress. He has no wheezes.  Lymphadenopathy:  He has no cervical adenopathy.  Neurological: He is alert and oriented to person, place, and time.  Skin: Skin is warm and dry. No rash noted. No erythema.  Psychiatric: He has a normal mood and affect. His behavior is normal.     Lab Results  Component Value Date   CD4TCELL 43 11/21/2014   Lab Results  Component Value Date   CD4TABS 870 11/21/2014   CD4TABS 980 09/06/2014   CD4TABS 880 05/01/2014   Lab Results  Component Value Date   HIV1RNAQUANT <20 01/15/2015   Lab Results  Component Value Date   HEPBSAB NEG 05/06/2010   No results found for: RPR  CBC Lab  Results  Component Value Date   WBC 7.2 02/03/2015   RBC 4.82 02/03/2015   HGB 15.0 02/03/2015   HCT 44.8 02/03/2015   PLT 207 02/03/2015   MCV 92.9 02/03/2015   MCH 31.1 02/03/2015   MCHC 33.5 02/03/2015   RDW 12.8 02/03/2015   LYMPHSABS 2.0 02/03/2015   MONOABS 0.9 02/03/2015   EOSABS 0.2 02/03/2015   BASOSABS 0.0 02/03/2015   BMET Lab Results  Component Value Date   NA 137 02/03/2015   K 4.0 02/03/2015   CL 105 02/03/2015   CO2 24 02/03/2015   GLUCOSE 86 02/03/2015   BUN 11 02/03/2015   CREATININE 1.28* 02/03/2015    CALCIUM 9.0 02/03/2015   GFRNONAA 58* 02/03/2015   GFRAA >60 02/03/2015     Assessment and Plan   hiv disease = no need for changing his regimen. He had viral blip of 580 now undetectable. Continue on current regimen  ckd 2= stable. No need to change doses of lamivudine  sinusitis = viral, encourage good oral fluid intake, and can try saline nasal spray  rtc in 6 mon, labs 2 wk prior

## 2015-02-26 ENCOUNTER — Other Ambulatory Visit: Payer: 59

## 2015-06-08 ENCOUNTER — Other Ambulatory Visit: Payer: Self-pay | Admitting: Internal Medicine

## 2015-06-11 ENCOUNTER — Ambulatory Visit (INDEPENDENT_AMBULATORY_CARE_PROVIDER_SITE_OTHER): Payer: Self-pay | Admitting: Internal Medicine

## 2015-06-11 ENCOUNTER — Encounter: Payer: Self-pay | Admitting: Internal Medicine

## 2015-06-11 VITALS — BP 146/83 | HR 83 | Temp 97.4°F

## 2015-06-11 DIAGNOSIS — I10 Essential (primary) hypertension: Secondary | ICD-10-CM

## 2015-06-11 DIAGNOSIS — E785 Hyperlipidemia, unspecified: Secondary | ICD-10-CM

## 2015-06-11 DIAGNOSIS — Z79899 Other long term (current) drug therapy: Secondary | ICD-10-CM

## 2015-06-11 DIAGNOSIS — B20 Human immunodeficiency virus [HIV] disease: Secondary | ICD-10-CM

## 2015-06-11 LAB — COMPLETE METABOLIC PANEL WITH GFR
ALT: 35 U/L (ref 9–46)
AST: 29 U/L (ref 10–35)
Albumin: 3.8 g/dL (ref 3.6–5.1)
Alkaline Phosphatase: 66 U/L (ref 40–115)
BUN: 16 mg/dL (ref 7–25)
CALCIUM: 9 mg/dL (ref 8.6–10.3)
CHLORIDE: 107 mmol/L (ref 98–110)
CO2: 24 mmol/L (ref 20–31)
Creat: 1.15 mg/dL (ref 0.70–1.25)
GFR, Est African American: 78 mL/min (ref >=60)
GFR, Est Non African American: 67 mL/min (ref >=60)
Glucose, Bld: 92 mg/dL (ref 65–99)
POTASSIUM: 4 mmol/L (ref 3.5–5.3)
Sodium: 139 mmol/L (ref 135–146)
Total Bilirubin: 0.6 mg/dL (ref 0.2–1.2)
Total Protein: 7.7 g/dL (ref 6.1–8.1)

## 2015-06-11 LAB — LIPID PANEL
CHOL/HDL RATIO: 7.4 ratio — AB (ref <=5.0)
CHOLESTEROL: 215 mg/dL — AB (ref 125–200)
HDL: 29 mg/dL — ABNORMAL LOW (ref >=40)
LDL CALC: 149 mg/dL — AB (ref <130)
TRIGLYCERIDES: 183 mg/dL — AB (ref <150)
VLDL: 37 mg/dL — AB (ref <30)

## 2015-06-11 LAB — CBC WITH DIFFERENTIAL/PLATELET
BASOS ABS: 0 10*3/uL (ref 0.0–0.1)
BASOS PCT: 1 % (ref 0–1)
EOS ABS: 0.3 10*3/uL (ref 0.0–0.7)
EOS PCT: 8 % — AB (ref 0–5)
HEMATOCRIT: 43.6 % (ref 39.0–52.0)
Hemoglobin: 14.9 g/dL (ref 13.0–17.0)
LYMPHS PCT: 50 % — AB (ref 12–46)
Lymphs Abs: 1.9 10*3/uL (ref 0.7–4.0)
MCH: 30.5 pg (ref 26.0–34.0)
MCHC: 34.2 g/dL (ref 30.0–36.0)
MCV: 89.3 fL (ref 78.0–100.0)
MONO ABS: 0.3 10*3/uL (ref 0.1–1.0)
MPV: 9.9 fL (ref 8.6–12.4)
Monocytes Relative: 7 % (ref 3–12)
Neutro Abs: 1.3 10*3/uL — ABNORMAL LOW (ref 1.7–7.7)
Neutrophils Relative %: 34 % — ABNORMAL LOW (ref 43–77)
PLATELETS: 228 10*3/uL (ref 150–400)
RBC: 4.88 MIL/uL (ref 4.22–5.81)
RDW: 13.7 % (ref 11.5–15.5)
WBC: 3.8 10*3/uL — AB (ref 4.0–10.5)

## 2015-06-11 MED ORDER — LISINOPRIL 20 MG PO TABS: ORAL_TABLET | ORAL | Status: DC

## 2015-06-11 NOTE — Progress Notes (Signed)
Patient ID: Jamie Clark, male   DOB: 04/26/1950, 65 y.o.   MRN: [id number]       Patient ID: Jamie Clark, male   DOB: 05/06/1950, [age] y.o.   MRN: [id number]  HPI 870/VL<20 on triumeq. Doing well with adherence. He has no complaints. He denies having any recent illnesses. Attempting to keep active and still engages with [facility] higher grounds  Outpatient Encounter Prescriptions as of 06/11/2015  Medication Sig  . Abacavir-Dolutegravir-Lamivud (TRIUMEQ) 600-50-300 MG TABS Take 1 tablet by mouth daily.  . hydrochlorothiazide (HYDRODIURIL) 25 MG tablet TAKE 1 TABLET(25 MG) BY MOUTH AT BEDTIME  . lisinopril (PRINIVIL,ZESTRIL) 20 MG tablet TAKE 1/2 TABLET BY MOUTH DAILY UNTIL NEXT APPOINTMENT OR 1 TABLET DAILY AS DIRECTED  . ondansetron (ZOFRAN ODT) 4 MG disintegrating tablet Take 1 tablet (4 mg total) by mouth every 8 (eight) hours as needed for nausea or vomiting. (Patient not taking: Reported on 06/11/2015)  . oxymetazoline (AFRIN NASAL SPRAY) 0.05 % nasal spray Place 1 spray into both nostrils 2 (two) times daily. (Patient not taking: Reported on 06/11/2015)  . terbinafine (LAMISIL AT) 1 % cream Apply 1 application topically 2 (two) times daily. To feet (Patient not taking: Reported on 06/11/2015)   No facility-administered encounter medications on file as of 06/11/2015.     Patient Active Problem List   Diagnosis Date Noted  . Acute pain of left foot 04/30/2014  . Hyperglycemia 04/30/2014  . Cellulitis and abscess of left foot 04/30/2014  . Acute renal failure (HCC) 04/30/2014  . Tinea pedis of left foot 10/23/2013  . Phlebitis 04/01/2011    Class: History of  . INSOMNIA 04/14/2010  . Dyslipidemia 01/17/2009  . HTN (hypertension) 01/17/2009  . HIV infection (HCC) 12/18/2008  . Chronic viral hepatitis C (HCC) 07/13/2007     Health Maintenance Due  Topic Date Due  . TETANUS/TDAP  01/22/1971  . COLONOSCOPY  01/21/2002  . ZOSTAVAX  01/22/2012     Review of Systems Review of  Systems  Constitutional: Negative for fever, chills, diaphoresis, activity change, appetite change, fatigue and unexpected weight change.  HENT: Negative for congestion, sore throat, rhinorrhea, sneezing, trouble swallowing and sinus pressure.  Eyes: Negative for photophobia and visual disturbance.  Respiratory: Negative for cough, chest tightness, shortness of breath, wheezing and stridor.  Cardiovascular: Negative for chest pain, palpitations and leg swelling.  Gastrointestinal: Negative for nausea, vomiting, abdominal pain, diarrhea, constipation, blood in stool, abdominal distention and anal bleeding.  Genitourinary: Negative for dysuria, hematuria, flank pain and difficulty urinating.  Musculoskeletal: Negative for myalgias, back pain, joint swelling, arthralgias and gait problem.  Skin: Negative for color change, pallor, rash and wound.  Neurological: Negative for dizziness, tremors, weakness and light-headedness.  Hematological: Negative for adenopathy. Does not bruise/bleed easily.  Psychiatric/Behavioral: Negative for behavioral problems, confusion, sleep disturbance, dysphoric mood, decreased concentration and agitation.    Physical Exam   BP 146/83 mmHg  Pulse 83  Temp(Src) 97.4 F (36.3 C) (Oral) Physical Exam  Constitutional: He is oriented to person, place, and time. He appears well-developed and well-nourished. No distress.  HENT:  Mouth/Throat: Oropharynx is clear and moist. No oropharyngeal exudate.  Cardiovascular: Normal rate, regular rhythm and normal heart sounds. Exam reveals no gallop and no friction rub.  No murmur heard.  Pulmonary/Chest: Effort normal and breath sounds normal. No respiratory distress. He has no wheezes.  Abdominal: Soft. Bowel sounds are normal. He exhibits no distension. There is no tenderness.  Lymphadenopathy:  He has no cervical adenopathy.  Neurological: He is alert and oriented to person, place, and time.  Skin: Skin is warm and dry. No  rash noted. No erythema.  Psychiatric: He has a normal mood and affect. His behavior is normal.    Lab Results  Component Value Date   CD4TCELL 43 11/21/2014   Lab Results  Component Value Date   CD4TABS 870 11/21/2014   CD4TABS 980 09/06/2014   CD4TABS 880 05/01/2014   Lab Results  Component Value Date   HIV1RNAQUANT <20 01/15/2015   Lab Results  Component Value Date   HEPBSAB NEG 05/06/2010   No results found for: RPR  CBC Lab Results  Component Value Date   WBC 7.2 02/03/2015   RBC 4.82 02/03/2015   HGB 15.0 02/03/2015   HCT 44.8 02/03/2015   PLT 207 02/03/2015   MCV 92.9 02/03/2015   MCH 31.1 02/03/2015   MCHC 33.5 02/03/2015   RDW 12.8 02/03/2015   LYMPHSABS 2.0 02/03/2015   MONOABS 0.9 02/03/2015   EOSABS 0.2 02/03/2015   BASOSABS 0.0 02/03/2015   BMET Lab Results  Component Value Date   NA 137 02/03/2015   K 4.0 02/03/2015   CL 105 02/03/2015   CO2 24 02/03/2015   GLUCOSE 86 02/03/2015   BUN 11 02/03/2015   CREATININE 1.28* 02/03/2015   CALCIUM 9.0 02/03/2015   GFRNONAA 58* 02/03/2015   GFRAA >60 02/03/2015     Assessment and Plan  hiv disease = will check labs, otherwise well controlled  ckd 2 = stable  htn = not at goal, will add lisinopril  dialy  Hx of hyperglycemia = no longer an issue. Not considerend prediabetic  Dyslipidemia = will check lipids  Health maintenance = will see when he is due for colonoscopy

## 2015-06-12 LAB — T-HELPER CELL (CD4) - (RCID CLINIC ONLY)
CD4 % Helper T Cell: 44 % (ref 33–55)
CD4 T CELL ABS: 850 /uL (ref 400–2700)

## 2015-06-13 LAB — HIV-1 RNA QUANT-NO REFLEX-BLD
HIV 1 RNA Quant: 62 copies/mL — ABNORMAL HIGH (ref <20)
HIV-1 RNA QUANT, LOG: 1.79 {Log_copies}/mL — AB (ref <1.30)

## 2015-07-05 ENCOUNTER — Other Ambulatory Visit: Payer: Self-pay | Admitting: Internal Medicine

## 2015-07-19 IMAGING — US US SCROTUM
1 series · 13 of 25 positions shown · non-contrast
Comparison: None.

CLINICAL DATA: Right testicular pain and swelling which began
yesterday

EXAM:
SCROTAL ULTRASOUND
DOPPLER ULTRASOUND OF THE TESTICLES
TECHNIQUE: Complete ultrasound examination of the testicles, epididymis, and
other scrotal structures was performed. Color and spectral Doppler
ultrasound were also utilized to evaluate blood flow to the
testicles.

[Series 1: us scrotum · 0.09mm/px · 13 of 63 slices shown]
[im 1/63]
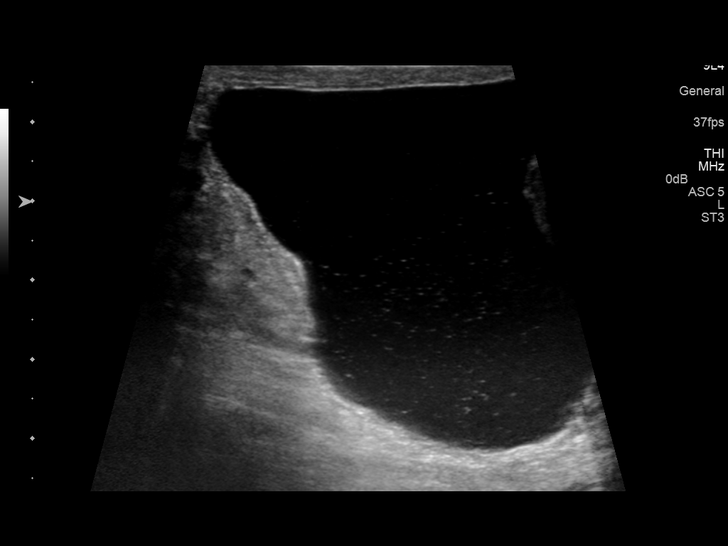
[im 6/63]
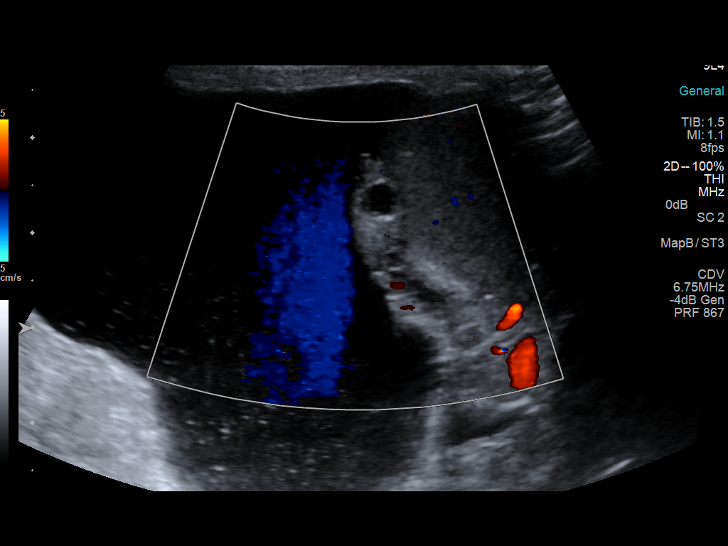
[im 11/63]
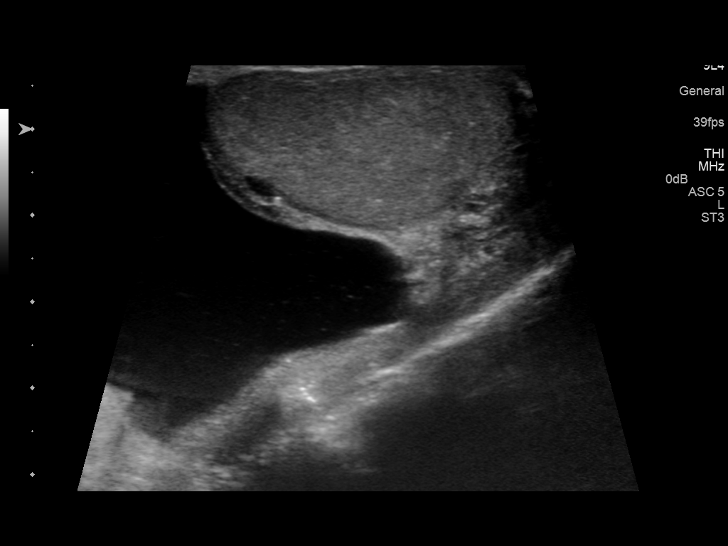
[im 16/63]
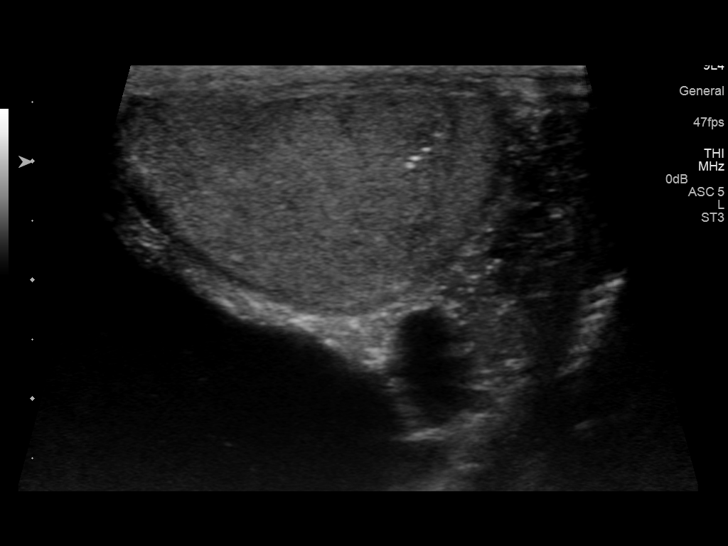
[im 21/63]
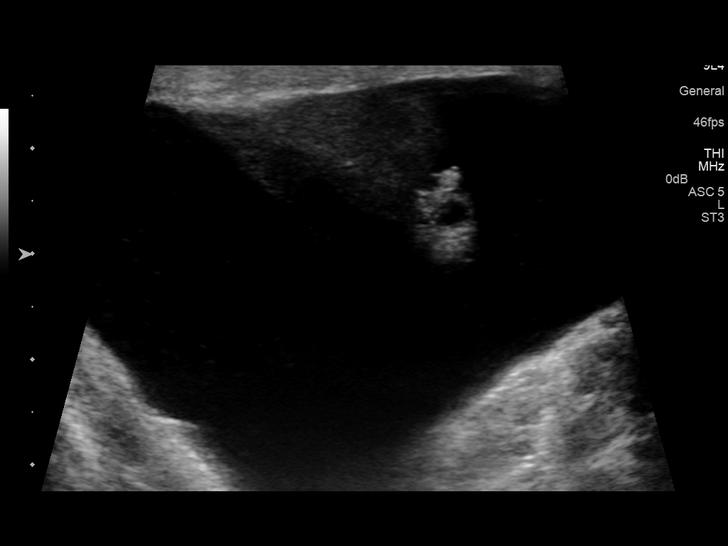
[im 26/63]
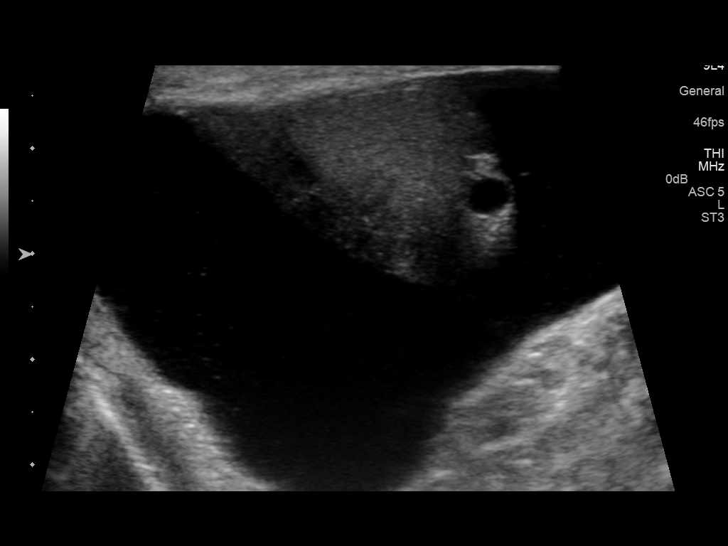
[im 32/63]
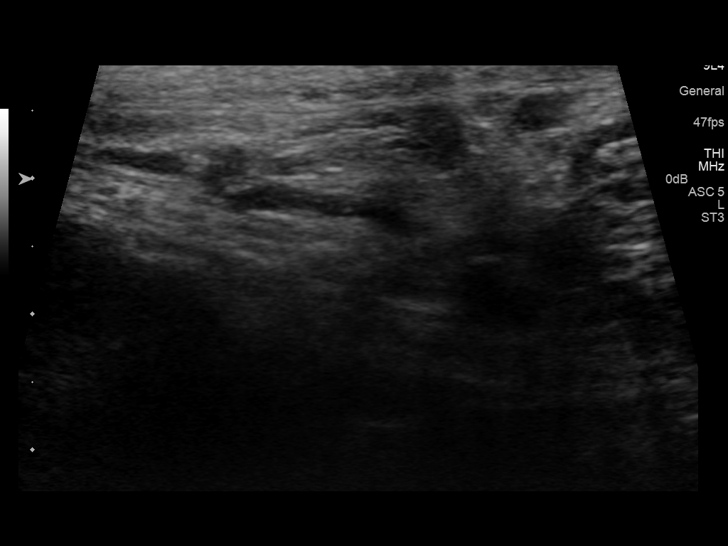
[im 37/63]
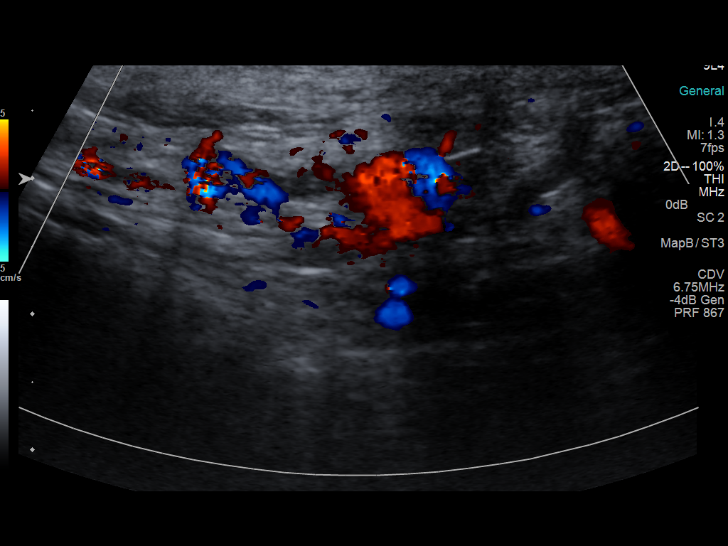
[im 42/63]
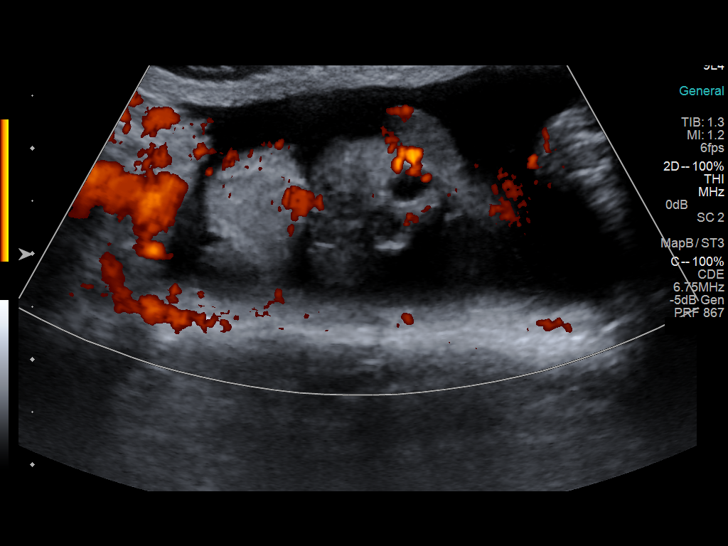
[im 47/63]
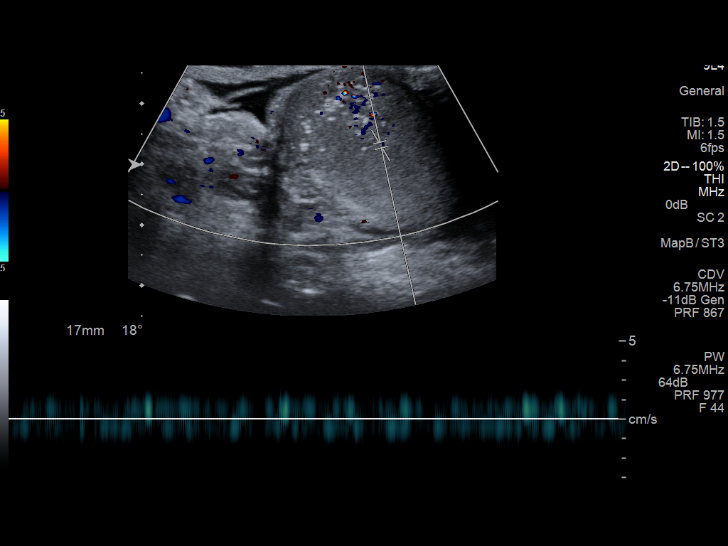
[im 52/63]
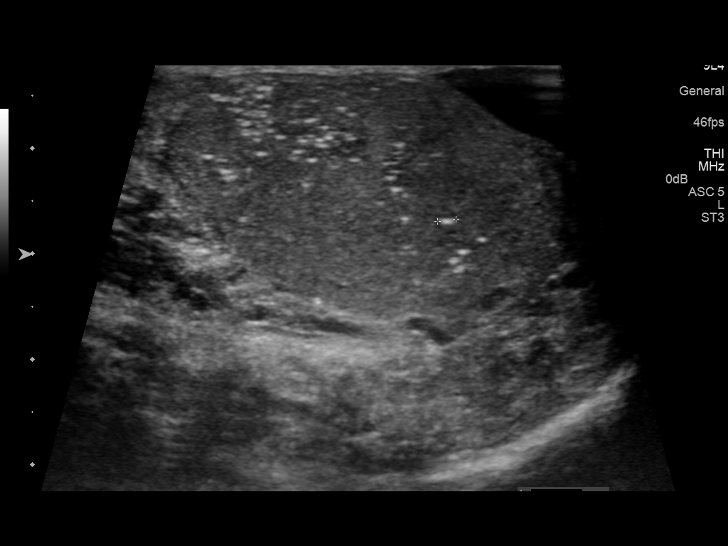
[im 57/63]
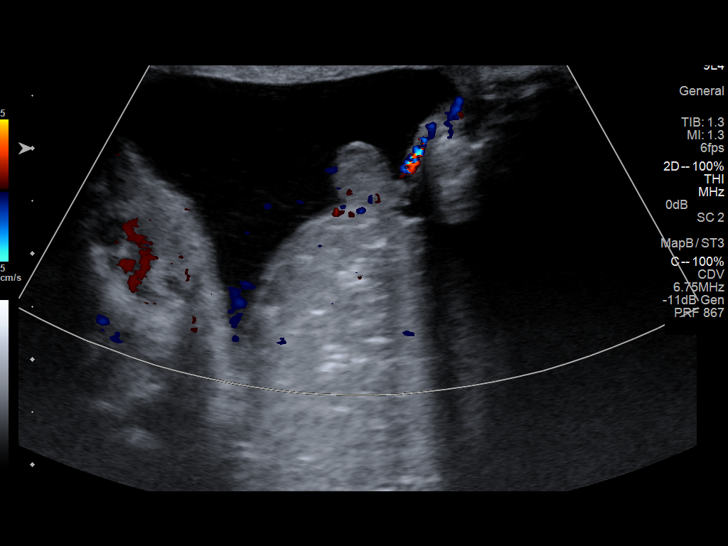
[im 63/63]
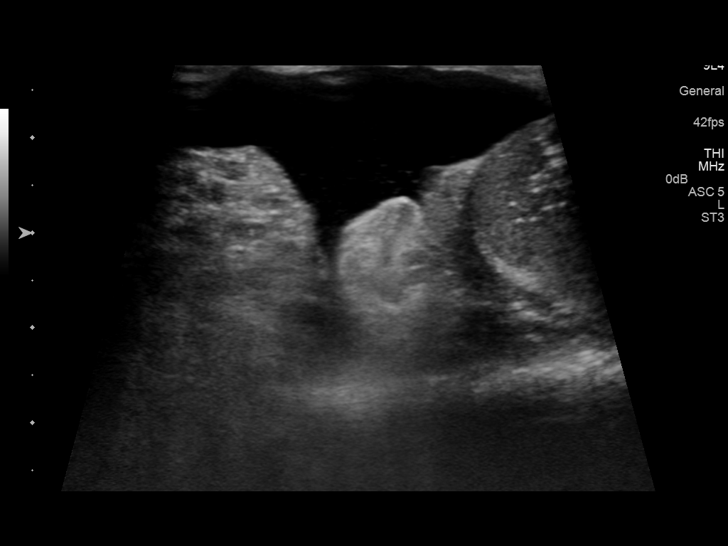

[13 of 25 positions shown; findings below may reference images not displayed]

FINDINGS: Right testicle

Measurements: 33 x 32 x 37 mm.  Microlithiasis.  No mass..

Left testicle

Measurements:  40 x 25 x 24 mm.  Extensive microlithiasis.  No mass.

Right epididymis: No significant abnormalities. Tiny epididymal head
cyst.

Left epididymis:  Tiny epididymal head cyst.

Hydrocele: Very large right hydrocele with debris. Possible
loculation within the hydrocele. Significant left hydrocele as well.

Varicocele:  None visualized.

Pulsed Doppler interrogation of both testes demonstrates normal low
resistance arterial and venous waveforms bilaterally.
IMPRESSION: 1. No evidence of testicular torsion.
2. Bilateral microlithiasis. Current literature suggests that
testicular microlithiasis is not a significant independent risk
factor for development of testicular carcinoma, and that follow up
imaging is not warranted in the absence of other risk factors.
Monthly testicular self-examination and annual physical exams are
considered appropriate surveillance. If patient has other risk
factors for testicular carcinoma, then referral to Urology should be
considered. (Reference: Pfister, et al.: A 5-Year Follow up Study
of Asymptomatic Men with Testicular Microlithiasis. J Urol 6550;
3. Large hydroceles right larger than left, complex.

## 2015-07-25 ENCOUNTER — Other Ambulatory Visit: Payer: 59

## 2015-08-08 ENCOUNTER — Ambulatory Visit: Payer: 59 | Admitting: Internal Medicine

## 2015-08-10 ENCOUNTER — Other Ambulatory Visit: Payer: Self-pay | Admitting: Internal Medicine

## 2015-08-10 DIAGNOSIS — B20 Human immunodeficiency virus [HIV] disease: Secondary | ICD-10-CM

## 2015-08-12 ENCOUNTER — Emergency Department (HOSPITAL_COMMUNITY)
Admission: EM | Admit: 2015-08-12 | Discharge: 2015-08-13 | Disposition: A | Discharge status: Home / Self Care | Payer: Self-pay | Attending: Emergency Medicine | Admitting: Emergency Medicine

## 2015-08-12 ENCOUNTER — Encounter (HOSPITAL_COMMUNITY): Payer: Self-pay | Admitting: Emergency Medicine

## 2015-08-12 DIAGNOSIS — Z79899 Other long term (current) drug therapy: Secondary | ICD-10-CM | POA: Insufficient documentation

## 2015-08-12 DIAGNOSIS — E119 Type 2 diabetes mellitus without complications: Secondary | ICD-10-CM | POA: Insufficient documentation

## 2015-08-12 DIAGNOSIS — I10 Essential (primary) hypertension: Secondary | ICD-10-CM | POA: Insufficient documentation

## 2015-08-12 DIAGNOSIS — B20 Human immunodeficiency virus [HIV] disease: Secondary | ICD-10-CM | POA: Insufficient documentation

## 2015-08-12 DIAGNOSIS — F17210 Nicotine dependence, cigarettes, uncomplicated: Secondary | ICD-10-CM | POA: Insufficient documentation

## 2015-08-12 DIAGNOSIS — Y9289 Other specified places as the place of occurrence of the external cause: Secondary | ICD-10-CM | POA: Insufficient documentation

## 2015-08-12 DIAGNOSIS — S90415A Abrasion, left lesser toe(s), initial encounter: Secondary | ICD-10-CM | POA: Insufficient documentation

## 2015-08-12 DIAGNOSIS — X58XXXA Exposure to other specified factors, initial encounter: Secondary | ICD-10-CM | POA: Insufficient documentation

## 2015-08-12 DIAGNOSIS — Y9389 Activity, other specified: Secondary | ICD-10-CM | POA: Insufficient documentation

## 2015-08-12 DIAGNOSIS — Y998 Other external cause status: Secondary | ICD-10-CM | POA: Insufficient documentation

## 2015-08-12 LAB — CBG MONITORING, ED: Glucose-Capillary: 108 mg/dL — ABNORMAL HIGH (ref 65–99)

## 2015-08-12 NOTE — ED Provider Notes (Signed)
CSN: [id number]     Arrival date & time 08/12/15  2251 History   First MD Initiated Contact with Patient 08/12/15 2341     Chief Complaint  Patient presents with  . Toe Pain     (Consider location/radiation/quality/duration/timing/severity/associated sxs/prior Treatment) Patient is a 65 y.o. male presenting with toe pain. The history is provided by the patient.  Toe Pain This is a new problem. The current episode started 1 to 4 weeks ago. The problem occurs constantly. The problem has been gradually worsening.   Sindy MessingWalter Ramnath is a 65 y.o. male with hx of HIV, Hep. C, HTN and DM presents to the ED with complaint of pain, redness and draining sores to the toes on the left foot. He reports that the symptoms started 5/11 and have gotten worse. He reports being admitted in Feb. For similar problem but reports that it seems worse this time and he has not been able to get the infection to improve with the use of Neosporin Ointment. He went to [facility] tonight and ask what he should get to try and help with it and they told him to come to the ED. Patient denies fever, chills or red streaking.   Past Medical History  Diagnosis Date  . Hypertension   . HIV positive (HCC)   . Hepatitis C, chronic (HCC)   . Diabetes mellitus without complication (HCC)    History reviewed. No pertinent past surgical history. History reviewed. No pertinent family history. Social History  Substance Use Topics  . Smoking status: Current Every Day Smoker -- 0.15 packs/day    Types: Cigarettes  . Smokeless tobacco: Never Used     Comment: not ready to quit - cutting back  . Alcohol Use: No    Review of Systems  Skin: Positive for wound.  all other systems negative    Allergies  Review of patient's allergies indicates no known allergies.  Home Medications   Prior to Admission medications   Medication Sig Start Date End Date Taking? Authorizing Provider  hydrochlorothiazide (HYDRODIURIL) 25 MG  tablet TAKE 1 TABLET(25 MG) BY MOUTH AT BEDTIME 07/08/15   Judyann Munsonynthia Snider, MD  lisinopril (PRINIVIL,ZESTRIL) 20 MG tablet TAKE 1/2 TABLET BY MOUTH DAILY UNTIL NEXT APPOINTMENT OR 1 TABLET DAILY AS DIRECTED 06/11/15   Judyann Munsonynthia Snider, MD  ondansetron (ZOFRAN ODT) 4 MG disintegrating tablet Take 1 tablet (4 mg total) by mouth every 8 (eight) hours as needed for nausea or vomiting. Patient not taking: Reported on 06/11/2015 02/03/15   Barrett HenleNicole Elizabeth Nadeau, PA-C  oxymetazoline [facility](AFRIN NASAL SPRAY) 0.05 % nasal spray Place 1 spray into both nostrils 2 (two) times daily. Patient not taking: Reported on 06/11/2015 02/03/15   Barrett HenleNicole Elizabeth Nadeau, PA-C  terbinafine (LAMISIL AT) 1 % cream Apply 1 application topically 2 (two) times daily. To feet Patient not taking: Reported on 06/11/2015 09/06/14   Judyann Munsonynthia Snider, MD  TRIUMEQ 600-50-300 MG tablet TAKE 1 TABLET BY MOUTH DAILY 08/12/15   Judyann Munsonynthia Snider, MD   BP 138/98 mmHg  Pulse 80  Temp(Src) 98.2 F (36.8 C) (Oral)  Resp 20  Wt 79.379 kg  SpO2 100% Physical Exam  Constitutional: He is oriented to person, place, and time. He appears well-developed and well-nourished. No distress.  HENT:  Head: Normocephalic.  Eyes: EOM are normal.  Neck: Neck supple.  Cardiovascular: Normal rate.   Pulmonary/Chest: Effort normal.  Musculoskeletal: Normal range of motion.  There is swelling and open wounds to the toes of the left  foot. The wounds are between the toes and to the dorsum of the toes. Pedal pulses 2+, adequate circulation, good touch sensation. Tender on palpation to the dorsum of the foot at the base of the toes.   Neurological: He is alert and oriented to person, place, and time. No cranial nerve deficit.  Skin: Skin is warm and dry.  Psychiatric: He has a normal mood and affect. His behavior is normal.  Nursing note and vitals reviewed.   ED Course  Procedures (including critical care time) Labs Review Labs Reviewed  BASIC METABOLIC PANEL -  Abnormal; Notable for the following:    Glucose, Bld 104 (*)    All other components within normal limits  CBC WITH DIFFERENTIAL/PLATELET - Abnormal; Notable for the following:    Neutro Abs 1.5 (*)    All other components within normal limits  CBG MONITORING, ED - Abnormal; Notable for the following:    Glucose-Capillary 108 (*)    All other components within normal limits  I-STAT CG4 LACTIC ACID, ED    Imaging Review Dg Foot Complete Left  08/13/2015  CLINICAL DATA:  Superimposed infection of left-sided athlete's foot, about the third and fourth toes, over the past 2 weeks. Initial encounter. EXAM: LEFT FOOT - COMPLETE 3+ VIEW COMPARISON:  MRI of the left foot and left foot radiographs performed 04/30/2014 FINDINGS: There is no evidence of fracture or dislocation. There is no evidence of osseous erosion. Known infection is not well characterized on radiograph. Visualized joint spaces are grossly preserved. An os peroneum is noted. IMPRESSION: 1. No evidence of fracture or dislocation. No evidence of osseous erosion. 2. Os peroneum noted. Electronically Signed   By: Roanna Raider M.D.   On: 08/13/2015 02:07    MDM  Care turned over to Lajuana Ripple, [facility] @ 0240 she will discuss with Dr. Rhunette Croft and decide plan of care.    [address] [place], [place] 08/13/15 4540  Derwood Kaplan, MD 08/13/15 [phone]

## 2015-08-12 NOTE — ED Notes (Signed)
Patient arrives with complaint of infected left 3rd and 4th toes. States onset of 5/11. Toes currently appear mildly swollen with some clear drainage visible. Patient reports history of similar which required admission, but patient explains that infection extended into his lower leg. Diabetic; CBG: 108.

## 2015-08-13 ENCOUNTER — Emergency Department (HOSPITAL_COMMUNITY): Payer: Self-pay

## 2015-08-13 LAB — BASIC METABOLIC PANEL
Anion gap: 5 (ref 5–15)
BUN: 16 mg/dL (ref 6–20)
CALCIUM: 9.1 mg/dL (ref 8.9–10.3)
CO2: 24 mmol/L (ref 22–32)
Chloride: 109 mmol/L (ref 101–111)
Creatinine, Ser: 1.24 mg/dL (ref 0.61–1.24)
GFR calc Af Amer: >60 mL/min (ref >60)
GLUCOSE: 104 mg/dL — AB (ref 65–99)
Potassium: 3.5 mmol/L (ref 3.5–5.1)
SODIUM: 138 mmol/L (ref 135–145)

## 2015-08-13 LAB — CBC WITH DIFFERENTIAL/PLATELET
BASOS ABS: 0 10*3/uL (ref 0.0–0.1)
BASOS PCT: 0 %
EOS ABS: 0.4 10*3/uL (ref 0.0–0.7)
EOS PCT: 8 %
HCT: 42.5 % (ref 39.0–52.0)
Hemoglobin: 13.7 g/dL (ref 13.0–17.0)
Lymphocytes Relative: 49 %
Lymphs Abs: 2.3 10*3/uL (ref 0.7–4.0)
MCH: 30.2 pg (ref 26.0–34.0)
MCHC: 32.2 g/dL (ref 30.0–36.0)
MCV: 93.8 fL (ref 78.0–100.0)
MONO ABS: 0.5 10*3/uL (ref 0.1–1.0)
Monocytes Relative: 10 %
Neutro Abs: 1.5 10*3/uL — ABNORMAL LOW (ref 1.7–7.7)
Neutrophils Relative %: 33 %
PLATELETS: 252 10*3/uL (ref 150–400)
RBC: 4.53 MIL/uL (ref 4.22–5.81)
RDW: 13.4 % (ref 11.5–15.5)
WBC: 4.6 10*3/uL (ref 4.0–10.5)

## 2015-08-13 LAB — I-STAT CG4 LACTIC ACID, ED: LACTIC ACID, VENOUS: 0.94 mmol/L (ref 0.5–2.0)

## 2015-08-13 MED ORDER — CEPHALEXIN 500 MG PO CAPS: 500.0000 mg | ORAL_CAPSULE | Freq: Four times a day (QID) | ORAL | Status: DC

## 2015-08-13 NOTE — Discharge Instructions (Signed)
Abrasion °An abrasion is a cut or scrape on the outer surface of your skin. An abrasion does not extend through all of the layers of your skin. It is important to care for your abrasion properly to prevent infection. °CAUSES °Most abrasions are caused by falling on or gliding across the ground or another surface. When your skin rubs on something, the outer and inner layer of skin rubs off.  °SYMPTOMS °A cut or scrape is the main symptom of this condition. The scrape may be bleeding, or it may appear red or pink. If there was an associated fall, there may be an underlying bruise. °DIAGNOSIS °An abrasion is diagnosed with a physical exam. °TREATMENT °Treatment for this condition depends on how large and deep the abrasion is. Usually, your abrasion will be cleaned with water and mild soap. This removes any dirt or debris that may be stuck. An antibiotic ointment may be applied to the abrasion to help prevent infection. A bandage (dressing) may be placed on the abrasion to keep it clean. °You may also need a tetanus shot. °HOME CARE INSTRUCTIONS °Medicines °· Take or apply medicines only as directed by your health care provider. °· If you were prescribed an antibiotic ointment, finish all of it even if you start to feel better. °Wound Care °· Clean the wound with mild soap and water 2-3 times per day or as directed by your health care provider. Pat your wound dry with a clean towel. Do not rub it. °· There are many different ways to close and cover a wound. Follow instructions from your health care provider about: °· Wound care. °· Dressing changes and removal. °· Check your wound every day for signs of infection. Watch for: °· Redness, swelling, or pain. °· Fluid, blood, or pus. °General Instructions °· Keep the dressing dry as directed by your health care provider. Do not take baths, swim, use a hot tub, or do anything that would put your wound underwater until your health care provider approves. °· If there is  swelling, raise (elevate) the injured area above the level of your heart while you are sitting or lying down. °· Keep all follow-up visits as directed by your health care provider. This is important. °SEEK MEDICAL CARE IF: °· You received a tetanus shot and you have swelling, severe pain, redness, or bleeding at the injection site. °· Your pain is not controlled with medicine. °· You have increased redness, swelling, or pain at the site of your wound. °SEEK IMMEDIATE MEDICAL CARE IF: °· You have a red streak going away from your wound. °· You have a fever. °· You have fluid, blood, or pus coming from your wound. °· You notice a bad smell coming from your wound or your dressing. °  °This information is not intended to replace advice given to you by your health care provider. Make sure you discuss any questions you have with your health care provider. °  °Document Released: 12/17/2004 Document Revised: 11/28/2014 Document Reviewed: 03/07/2014 °Elsevier Interactive Patient Education ©2016 Elsevier Inc. ° °Wound Care °Taking care of your wound properly can help to prevent pain and infection. It can also help your wound to heal more quickly.  °HOW TO CARE FOR YOUR WOUND  °· Take or apply over-the-counter and prescription medicines only as told by your health care provider. °· If you were prescribed antibiotic medicine, take or apply it as told by your health care provider. Do not stop using the antibiotic even if your condition improves. °·   Clean the wound each day or as told by your health care provider. °¨ Wash the wound with mild soap and water. °¨ Rinse the wound with water to remove all soap. °¨ Pat the wound dry with a clean towel. Do not rub it. °· There are many different ways to close and cover a wound. For example, a wound can be covered with stitches (sutures), skin glue, or adhesive strips. Follow instructions from your health care provider about: °¨ How to take care of your wound. °¨ When and how you should  change your bandage (dressing). °¨ When you should remove your dressing. °¨ Removing whatever was used to close your wound. °· Check your wound every day for signs of infection. Watch for: °¨ Redness, swelling, or pain. °¨ Fluid, blood, or pus. °· Keep the dressing dry until your health care provider says it can be removed. Do not take baths, swim, use a hot tub, or do anything that would put your wound underwater until your health care provider approves. °· Raise (elevate) the injured area above the level of your heart while you are sitting or lying down. °· Do not scratch or pick at the wound. °· Keep all follow-up visits as told by your health care provider. This is important. °SEEK MEDICAL CARE IF: °· You received a tetanus shot and you have swelling, severe pain, redness, or bleeding at the injection site. °· You have a fever. °· Your pain is not controlled with medicine. °· You have increased redness, swelling, or pain at the site of your wound. °· You have fluid, blood, or pus coming from your wound. °· You notice a bad smell coming from your wound or your dressing. °SEEK IMMEDIATE MEDICAL CARE IF: °· You have a red streak going away from your wound. °  °This information is not intended to replace advice given to you by your health care provider. Make sure you discuss any questions you have with your health care provider. °  °Document Released: 12/17/2007 Document Revised: 07/24/2014 Document Reviewed: 03/05/2014 °Elsevier Interactive Patient Education ©2016 Elsevier Inc. ° °

## 2015-08-13 NOTE — ED Provider Notes (Signed)
Patient care signed out at the end of shift by Kerrie BuffaloHope Neese, NP. Patient presented with toe pain, history of similar presentation with cellulitis and abscess. Complicated medical history including HIV, last CD4 853.   Toes of left foot have circular, raw areas over PIP of 3rd, 4th, 5th toes. No surrounding erythema or significant swelling. Non-tender forefoot. Labs are reassuring, imaging without evidence of osteomyelitis. Patient is afebrile. Discussed with Dr. Rhunette CroftNanavati who feels the patient can be discharged home with PO abx and close PCP/ID follow up.  Jamie AnisShari Telma Pyeatt, PA-C 08/13/15 [phone]

## 2015-09-05 ENCOUNTER — Ambulatory Visit: Payer: Self-pay | Admitting: Internal Medicine

## 2015-09-26 ENCOUNTER — Encounter (HOSPITAL_COMMUNITY): Payer: Self-pay | Admitting: Emergency Medicine

## 2015-09-26 ENCOUNTER — Emergency Department (HOSPITAL_COMMUNITY)
Admission: EM | Admit: 2015-09-26 | Discharge: 2015-09-26 | Disposition: A | Discharge status: Home / Self Care | Payer: Self-pay | Attending: Emergency Medicine | Admitting: Emergency Medicine

## 2015-09-26 DIAGNOSIS — E119 Type 2 diabetes mellitus without complications: Secondary | ICD-10-CM | POA: Insufficient documentation

## 2015-09-26 DIAGNOSIS — B353 Tinea pedis: Secondary | ICD-10-CM | POA: Insufficient documentation

## 2015-09-26 DIAGNOSIS — Z79899 Other long term (current) drug therapy: Secondary | ICD-10-CM | POA: Insufficient documentation

## 2015-09-26 DIAGNOSIS — L259 Unspecified contact dermatitis, unspecified cause: Secondary | ICD-10-CM | POA: Insufficient documentation

## 2015-09-26 DIAGNOSIS — I10 Essential (primary) hypertension: Secondary | ICD-10-CM | POA: Insufficient documentation

## 2015-09-26 DIAGNOSIS — F17210 Nicotine dependence, cigarettes, uncomplicated: Secondary | ICD-10-CM | POA: Insufficient documentation

## 2015-09-26 MED ORDER — CEPHALEXIN 500 MG PO CAPS: 500.0000 mg | ORAL_CAPSULE | Freq: Four times a day (QID) | ORAL | Status: DC

## 2015-09-26 MED ORDER — TERBINAFINE HCL 1 % EX CREA: 1.0000 "application " | TOPICAL_CREAM | Freq: Two times a day (BID) | CUTANEOUS | Status: DC

## 2015-09-26 NOTE — ED Provider Notes (Signed)
CSN: [id number]     Arrival date & time 09/26/15  0022 History  By signing my name below, I, Evon Slackerrance Branch, attest that this documentation has been prepared under the direction and in the presence of General MillsBenjamin Jovoni Borkenhagen, PA-C. Electronically Signed: Evon Slackerrance Branch, ED Scribe. 09/26/2015. 1:51 AM.      Chief Complaint  Patient presents with  . Foot Pain   Patient is a 65 y.o. male presenting with lower extremity pain. The history is provided by the patient. No language interpreter was used.  Foot Pain   HPI Comments: Jamie Clark is a 65 y.o. male who presents to the Emergency Department complaining of left foot infection around his toes onset 1 day prior. Pt doesn't report any associated symptoms. Pt states that he believes he had athletes foot. Pt denies any medications PTA. Pt denies fever, chills, nausea, or vomiting, Abdominal pain, chest pain or shortness of breath. Pt reports Hx of similar infection, he states he previously had relief with antibiotics.  States he will be able to follow-up with his PCP this week for reevaluation.  Past Medical History  Diagnosis Date  . Hypertension   . HIV positive (HCC)   . Hepatitis C, chronic (HCC)   . Diabetes mellitus without complication (HCC)    History reviewed. No pertinent past surgical history. No family history on file. Social History  Substance Use Topics  . Smoking status: Current Every Day Smoker -- 0.15 packs/day    Types: Cigarettes  . Smokeless tobacco: Never Used     Comment: not ready to quit - cutting back  . Alcohol Use: No    Review of Systems A complete 10 system review of systems was obtained and all systems are negative except as noted in the HPI and PMH.     Allergies  Review of patient's allergies indicates no known allergies.  Home Medications   Prior to Admission medications   Medication Sig Start Date End Date Taking? Authorizing Provider  hydrochlorothiazide (HYDRODIURIL) 25 MG tablet TAKE 1  TABLET(25 MG) BY MOUTH AT BEDTIME 07/08/15  Yes Judyann Munsonynthia Snider, MD  lisinopril (PRINIVIL,ZESTRIL) 20 MG tablet TAKE 1/2 TABLET BY MOUTH DAILY UNTIL NEXT APPOINTMENT OR 1 TABLET DAILY AS DIRECTED 06/11/15  Yes Judyann Munsonynthia Snider, MD  TRIUMEQ 600-50-300 MG tablet TAKE 1 TABLET BY MOUTH DAILY 08/12/15  Yes Judyann Munsonynthia Snider, MD  cephALEXin (KEFLEX) 500 MG capsule Take 1 capsule (500 mg total) by mouth 4 (four) times daily. 09/26/15   Joycie PeekBenjamin Denicia Pagliarulo, PA-C  ondansetron (ZOFRAN ODT) 4 MG disintegrating tablet Take 1 tablet (4 mg total) by mouth every 8 (eight) hours as needed for nausea or vomiting. Patient not taking: Reported on 06/11/2015 02/03/15   Barrett HenleNicole Elizabeth Nadeau, PA-C  oxymetazoline [facility](AFRIN NASAL SPRAY) 0.05 % nasal spray Place 1 spray into both nostrils 2 (two) times daily. Patient not taking: Reported on 06/11/2015 02/03/15   Barrett HenleNicole Elizabeth Nadeau, PA-C  terbinafine (LAMISIL AT) 1 % cream Apply 1 application topically 2 (two) times daily. To feet 09/26/15   Joycie PeekBenjamin Lawrie Tunks, PA-C   BP 130/79 mmHg  Pulse 57  Temp(Src) 98.1 F (36.7 C) (Oral)  Resp 16  SpO2 97%   Physical Exam  Constitutional: He is oriented to person, place, and time. He appears well-developed and well-nourished.  HENT:  Head: Normocephalic and atraumatic.  Mouth/Throat: Oropharynx is clear and moist.  Eyes: Conjunctivae are normal. Pupils are equal, round, and reactive to light. Right eye exhibits no discharge. Left eye exhibits no discharge. No  scleral icterus.  Neck: Neck supple.  Cardiovascular: Normal rate, regular rhythm, normal heart sounds and intact distal pulses.   Pulmonary/Chest: Effort normal and breath sounds normal. No respiratory distress. He has no wheezes. He has no rales.  Abdominal: Soft. There is no tenderness.  Musculoskeletal: He exhibits no tenderness.  Neurological: He is alert and oriented to person, place, and time.  Cranial Nerves II-XII grossly intact  Skin: Skin is warm and dry. No rash noted.   Apparent contact dermatitis over dorsum of left third toe. Also with macerated skin between webspace of 3,4 and 5th toes consistent with fungus.  Psychiatric: He has a normal mood and affect.  Nursing note and vitals reviewed.   ED Course  Procedures (including critical care time) DIAGNOSTIC STUDIES: Oxygen Saturation is 94% on RA, adequate by my interpretation.    COORDINATION OF CARE: 1:51 AM-Discussed treatment plan which includes antibiotics and antifungal with pt at bedside and pt agreed to plan.     Labs Review Labs Reviewed - No data to display  Imaging Review No results found.    EKG Interpretation None     Meds given in ED:  Medications - No data to display  Discharge Medication List as of 09/26/2015  2:47 AM     Filed Vitals:   09/26/15 0025 09/26/15 0244  BP: 178/110 130/79  Pulse: 93 57  Temp: 98.1 F (36.7 C)   TempSrc: Oral   Resp: 18 16  SpO2: 94% 97%    MDM  Patient with contact dermatitis and associated Tinea pedis. He is a diabetic. We'll cover with Keflex and Lotrimin. Discussed follow-up with his PCP for wound check in 2 days. He verbalizes understanding, agrees with this plan as well as subsequent discharge. Return precautions discussed. Overall appears very well, nontoxic, hemodynamically stable and appropriate for outpatient follow-up Final diagnoses:  Tinea pedis of left foot  Contact dermatitis      I personally performed the services described in this documentation, which was scribed in my presence. The recorded information has been reviewed and is accurate.      Joycie PeekBenjamin Jill Stopka, PA-C 09/26/15 [id number]  Dione Boozeavid Glick, MD 09/26/15 [phone]

## 2015-09-26 NOTE — ED Notes (Signed)
Pt presents today with several open shallow ulcers on his left toes.  States he was [facility] two [facility] the [facility] and was prescribed antibiotics for cellulitis.  States these symptoms just showed up yesterday with quick onset.  Pt said he thinks he has had athletes foot and put gold bond on it.

## 2015-09-26 NOTE — ED Notes (Signed)
Pt ambulated to room from triage without any difficulty

## 2015-09-26 NOTE — ED Notes (Signed)
Pt is a diabetic

## 2015-09-26 NOTE — Discharge Instructions (Signed)
Please keep your feet clean and dry. Take your medications as prescribed. Follow-up with your doctor in 3 days for wound recheck. Return to ED for any new or worsening symptoms as we discussed.  Athlete's Foot Athlete's foot (tinea pedis) is a fungal infection of the skin on the feet. It often occurs on the skin between the toes or underneath the toes. It can also occur on the soles of the feet. Athlete's foot is more likely to occur in hot, humid weather. Not washing your feet or changing your socks often enough can contribute to athlete's foot. The infection can spread from person to person (contagious). CAUSES Athlete's foot is caused by a fungus. This fungus thrives in warm, moist places. Most people get athlete's foot by sharing shower stalls, towels, and wet floors with an infected person. People with weakened immune systems, including those with diabetes, may be more likely to get athlete's foot. SYMPTOMS   Itchy areas between the toes or on the soles of the feet.  White, flaky, or scaly areas between the toes or on the soles of the feet.  Tiny, intensely itchy blisters between the toes or on the soles of the feet.  Tiny cuts on the skin. These cuts can develop a bacterial infection.  Thick or discolored toenails. DIAGNOSIS  Your caregiver can usually tell what the problem is by doing a physical exam. Your caregiver may also take a skin sample from the rash area. The skin sample may be examined under a microscope, or it may be tested to see if fungus will grow in the sample. A sample may also be taken from your toenail for testing. TREATMENT  Over-the-counter and prescription medicines can be used to kill the fungus. These medicines are available as powders or creams. Your caregiver can suggest medicines for you. Fungal infections respond slowly to treatment. You may need to continue using your medicine for several weeks. PREVENTION   Do not share towels.  Wear sandals in wet areas,  such as shared locker rooms and shared showers.  Keep your feet dry. Wear shoes that allow air to circulate. Wear cotton or wool socks. HOME CARE INSTRUCTIONS   Take medicines as directed by your caregiver. Do not use steroid creams on athlete's foot.  Keep your feet clean and cool. Wash your feet daily and dry them thoroughly, especially between your toes.  Change your socks every day. Wear cotton or wool socks. In hot climates, you may need to change your socks 2 to 3 times per day.  Wear sandals or canvas tennis shoes with good air circulation.  If you have blisters, soak your feet in Burow's solution or Epsom salts for 20 to 30 minutes, 2 times a day to dry out the blisters. Make sure you dry your feet thoroughly afterward. SEEK MEDICAL CARE IF:   You have a fever.  You have swelling, soreness, warmth, or redness in your foot.  You are not getting better after 7 days of treatment.  You are not completely cured after 30 days.  You have any problems caused by your medicines. MAKE SURE YOU:   Understand these instructions.  Will watch your condition.  Will get help right away if you are not doing well or get worse.   This information is not intended to replace advice given to you by your health care provider. Make sure you discuss any questions you have with your health care provider.   Document Released: 03/06/2000 Document Revised: 06/01/2011 Document Reviewed:  09/10/2014 Elsevier Interactive Patient Education [employer]2016 Elsevier Inc.

## 2015-10-01 ENCOUNTER — Encounter: Payer: Self-pay | Admitting: *Deleted

## 2015-10-01 ENCOUNTER — Encounter: Payer: Self-pay | Admitting: Internal Medicine

## 2015-10-01 ENCOUNTER — Ambulatory Visit (INDEPENDENT_AMBULATORY_CARE_PROVIDER_SITE_OTHER): Payer: Self-pay | Admitting: Internal Medicine

## 2015-10-01 ENCOUNTER — Other Ambulatory Visit: Payer: Self-pay | Admitting: *Deleted

## 2015-10-01 VITALS — BP 125/78 | HR 68 | Temp 97.7°F | Wt 174.0 lb

## 2015-10-01 DIAGNOSIS — L089 Local infection of the skin and subcutaneous tissue, unspecified: Secondary | ICD-10-CM

## 2015-10-01 DIAGNOSIS — B20 Human immunodeficiency virus [HIV] disease: Secondary | ICD-10-CM

## 2015-10-01 DIAGNOSIS — I10 Essential (primary) hypertension: Secondary | ICD-10-CM

## 2015-10-01 MED ORDER — HYDROCODONE-ACETAMINOPHEN 5-500 MG PO TABS: 1.0000 | ORAL_TABLET | Freq: Four times a day (QID) | ORAL | Status: DC | PRN

## 2015-10-01 MED ORDER — HYDROCODONE-ACETAMINOPHEN 5-325 MG PO TABS: 1.0000 | ORAL_TABLET | Freq: Four times a day (QID) | ORAL | Status: DC | PRN

## 2015-10-01 MED ORDER — DOXYCYCLINE HYCLATE 100 MG PO TABS: 100.0000 mg | ORAL_TABLET | Freq: Two times a day (BID) | ORAL | Status: DC

## 2015-10-01 NOTE — Progress Notes (Signed)
Patient ID: Jamie Clark, male   DOB: 09-Nov-1950, 65 y.o.   MRN: [id number]     RFV: sick visit, foot infection  Patient ID: Jamie Clark, male   DOB: [birth year], 65 y.o.   MRN: [id number]  HPI Jamie Clark is a 65yo M with well controlled hiv disease, chronic tinea pedis, he comes to clinic stating that he has been taking cephalexin without improvement to infection to left foot. He states that it has worsened due to itching and excoriation. He has still continued going to work, Clinical research associate.  Outpatient Encounter Prescriptions as of 10/01/2015  Medication Sig  . cephALEXin (KEFLEX) 500 MG capsule Take 1 capsule (500 mg total) by mouth 4 (four) times daily.  . hydrochlorothiazide (HYDRODIURIL) 25 MG tablet TAKE 1 TABLET(25 MG) BY MOUTH AT BEDTIME  . lisinopril (PRINIVIL,ZESTRIL) 20 MG tablet TAKE 1/2 TABLET BY MOUTH DAILY UNTIL NEXT APPOINTMENT OR 1 TABLET DAILY AS DIRECTED  . ondansetron (ZOFRAN ODT) 4 MG disintegrating tablet Take 1 tablet (4 mg total) by mouth every 8 (eight) hours as needed for nausea or vomiting.  [name] oxymetazoline (AFRIN NASAL SPRAY) 0.05 % nasal spray Place 1 spray into both nostrils 2 (two) times daily.  [name] terbinafine (LAMISIL AT) 1 % cream Apply 1 application topically 2 (two) times daily. To feet  . TRIUMEQ 600-50-300 MG tablet TAKE 1 TABLET BY MOUTH DAILY  . doxycycline (VIBRA-TABS) 100 MG tablet Take 1 tablet (100 mg total) by mouth 2 (two) times daily.  . [DISCONTINUED] HYDROcodone-acetaminophen (VICODIN) 5-500 MG tablet Take 1 tablet by mouth every 6 (six) hours as needed for pain.   No facility-administered encounter medications on file as of 10/01/2015.     Patient Active Problem List   Diagnosis Date Noted  . Acute pain of left foot 04/30/2014  . Hyperglycemia 04/30/2014  . Cellulitis and abscess of left foot 04/30/2014  . Acute renal failure (HCC) 04/30/2014  . Tinea pedis of left foot 10/23/2013  . Phlebitis 04/01/2011    Class: History  of  . INSOMNIA 04/14/2010  . Dyslipidemia 01/17/2009  . HTN (hypertension) 01/17/2009  . HIV infection (HCC) 12/18/2008  . Chronic viral hepatitis C (HCC) 07/13/2007     Health Maintenance Due  Topic Date Due  . TETANUS/TDAP  01/22/1971  . COLONOSCOPY  01/21/2002  . ZOSTAVAX  01/22/2012     Review of Systems  Physical Exam   BP 125/78 mmHg  Pulse 68  Temp(Src) 97.7 F (36.5 C) (Oral)  Wt 174 lb (78.926 kg) Physical Exam  Constitutional: He is oriented to person, place, and time. He appears well-developed and well-nourished. No distress.  HENT:  Mouth/Throat: Oropharynx is clear and moist. No oropharyngeal exudate.  Cardiovascular: Normal rate, regular rhythm and normal heart sounds. Exam reveals no gallop and no friction rub.  No murmur heard.  Pulmonary/Chest: Effort normal and breath sounds normal. No respiratory distress. He has no wheezes.  Ext : slight swelling to left lower foot Skin: cellulitis with areas of open ulcer from excoriation to tops of toes, serosanginous drainage. Also has ongoing tinea pedis between toes. Affecting digits #3, #4, #5 No posterior calf pain or lymph node. Redness localized to dorsum of foot Psychiatric: He has a normal mood and affect. His behavior is normal.    Lab Results  Component Value Date   CD4TCELL 44 06/11/2015   Lab Results  Component Value Date   CD4TABS 850 06/11/2015   CD4TABS 870 11/21/2014   CD4TABS 980 09/06/2014  Lab Results  Component Value Date   HIV1RNAQUANT 62* 06/11/2015   Lab Results  Component Value Date   HEPBSAB NEG 05/06/2010   No results found for: RPR  CBC Lab Results  Component Value Date   WBC 4.6 08/13/2015   RBC 4.53 08/13/2015   HGB 13.7 08/13/2015   HCT 42.5 08/13/2015   PLT 252 08/13/2015   MCV 93.8 08/13/2015   MCH 30.2 08/13/2015   MCHC 32.2 08/13/2015   RDW 13.4 08/13/2015   LYMPHSABS 2.3 08/13/2015   MONOABS 0.5 08/13/2015   EOSABS 0.4 08/13/2015   BASOSABS 0.0  08/13/2015   BMET Lab Results  Component Value Date   NA 138 08/13/2015   K 3.5 08/13/2015   CL 109 08/13/2015   CO2 24 08/13/2015   GLUCOSE 104* 08/13/2015   BUN 16 08/13/2015   CREATININE 1.24 08/13/2015   CALCIUM 9.1 08/13/2015   GFRNONAA >60 08/13/2015   GFRAA >60 08/13/2015     Assessment and Plan    Cellulitis and soft tissue infection of left foot = bacterial superinfection with underlying tinea pedis. Asked him to keep foot dry. Will need twice a day dressing changes. Off of work til July 64st. Will give 10d course of doxy since no improvement with keflex. See him back on 24th to see if any improvement. Gave him precautions to go to ED if swelling erythema pain, fever, chills occur  hiv disease= well controlled. Continue on current regimen  htn = well controlled at this time.

## 2015-10-11 ENCOUNTER — Encounter: Payer: Self-pay | Admitting: Internal Medicine

## 2015-10-14 ENCOUNTER — Ambulatory Visit: Payer: Self-pay | Admitting: Internal Medicine

## 2015-10-15 ENCOUNTER — Ambulatory Visit: Payer: Self-pay | Admitting: Internal Medicine

## 2015-11-02 ENCOUNTER — Other Ambulatory Visit: Payer: Self-pay | Admitting: Internal Medicine

## 2015-11-02 DIAGNOSIS — I10 Essential (primary) hypertension: Secondary | ICD-10-CM

## 2015-11-04 ENCOUNTER — Encounter (HOSPITAL_COMMUNITY): Payer: Self-pay | Admitting: *Deleted

## 2015-11-04 ENCOUNTER — Emergency Department (HOSPITAL_COMMUNITY)
Admission: EM | Admit: 2015-11-04 | Discharge: 2015-11-04 | Disposition: A | Discharge status: Home / Self Care | Payer: Self-pay | Attending: Emergency Medicine | Admitting: Emergency Medicine

## 2015-11-04 ENCOUNTER — Emergency Department (HOSPITAL_COMMUNITY): Payer: Self-pay

## 2015-11-04 DIAGNOSIS — L03116 Cellulitis of left lower limb: Secondary | ICD-10-CM | POA: Insufficient documentation

## 2015-11-04 DIAGNOSIS — B353 Tinea pedis: Secondary | ICD-10-CM | POA: Insufficient documentation

## 2015-11-04 DIAGNOSIS — L0889 Other specified local infections of the skin and subcutaneous tissue: Secondary | ICD-10-CM | POA: Insufficient documentation

## 2015-11-04 DIAGNOSIS — Z79899 Other long term (current) drug therapy: Secondary | ICD-10-CM | POA: Insufficient documentation

## 2015-11-04 DIAGNOSIS — L03119 Cellulitis of unspecified part of limb: Secondary | ICD-10-CM

## 2015-11-04 DIAGNOSIS — E1165 Type 2 diabetes mellitus with hyperglycemia: Secondary | ICD-10-CM | POA: Insufficient documentation

## 2015-11-04 DIAGNOSIS — B999 Unspecified infectious disease: Secondary | ICD-10-CM

## 2015-11-04 DIAGNOSIS — F17210 Nicotine dependence, cigarettes, uncomplicated: Secondary | ICD-10-CM | POA: Insufficient documentation

## 2015-11-04 DIAGNOSIS — I10 Essential (primary) hypertension: Secondary | ICD-10-CM | POA: Insufficient documentation

## 2015-11-04 LAB — CBC WITH DIFFERENTIAL/PLATELET
BASOS ABS: 0 10*3/uL (ref 0.0–0.1)
BASOS PCT: 1 %
Eosinophils Absolute: 0.2 10*3/uL (ref 0.0–0.7)
Eosinophils Relative: 5 %
HEMATOCRIT: 41.6 % (ref 39.0–52.0)
Hemoglobin: 13.8 g/dL (ref 13.0–17.0)
LYMPHS PCT: 51 %
Lymphs Abs: 2.2 10*3/uL (ref 0.7–4.0)
MCH: 30.7 pg (ref 26.0–34.0)
MCHC: 33.2 g/dL (ref 30.0–36.0)
MCV: 92.7 fL (ref 78.0–100.0)
Monocytes Absolute: 0.3 10*3/uL (ref 0.1–1.0)
Monocytes Relative: 7 %
Neutro Abs: 1.6 10*3/uL — ABNORMAL LOW (ref 1.7–7.7)
Neutrophils Relative %: 36 %
PLATELETS: 296 10*3/uL (ref 150–400)
RBC: 4.49 MIL/uL (ref 4.22–5.81)
RDW: 13.2 % (ref 11.5–15.5)
WBC: 4.3 10*3/uL (ref 4.0–10.5)

## 2015-11-04 LAB — COMPREHENSIVE METABOLIC PANEL
ALBUMIN: 3.3 g/dL — AB (ref 3.5–5.0)
ALT: 17 U/L (ref 17–63)
AST: 26 U/L (ref 15–41)
Alkaline Phosphatase: 74 U/L (ref 38–126)
Anion gap: 8 (ref 5–15)
BUN: 14 mg/dL (ref 6–20)
CHLORIDE: 110 mmol/L (ref 101–111)
CO2: 20 mmol/L — ABNORMAL LOW (ref 22–32)
CREATININE: 1.13 mg/dL (ref 0.61–1.24)
Calcium: 9.2 mg/dL (ref 8.9–10.3)
GFR calc Af Amer: >60 mL/min (ref >60)
GLUCOSE: 134 mg/dL — AB (ref 65–99)
POTASSIUM: 3.9 mmol/L (ref 3.5–5.1)
Sodium: 138 mmol/L (ref 135–145)
Total Bilirubin: 0.6 mg/dL (ref 0.3–1.2)
Total Protein: 6.9 g/dL (ref 6.5–8.1)

## 2015-11-04 LAB — I-STAT CG4 LACTIC ACID, ED
LACTIC ACID, VENOUS: 2.1 mmol/L — AB (ref 0.5–1.9)
Lactic Acid, Venous: 1.24 mmol/L (ref 0.5–1.9)

## 2015-11-04 MED ORDER — DOXYCYCLINE HYCLATE 100 MG PO CAPS: 100.0000 mg | ORAL_CAPSULE | Freq: Two times a day (BID) | ORAL | 0 refills | Status: DC

## 2015-11-04 MED ORDER — FLUCONAZOLE 200 MG PO TABS: 200.0000 mg | ORAL_TABLET | Freq: Every day | ORAL | 0 refills | Status: DC

## 2015-11-04 NOTE — ED Notes (Signed)
Pt states had a bad case of Athletes foot in July that just got worse and worse. Pt saw his PCP recently and got some antibiotics that he finished last week.  Pt was told that if he saw any drainage to come in and see a doctor.

## 2015-11-04 NOTE — ED Provider Notes (Signed)
[facility]-EMERGENCY DEPT Provider Note   CSN: [id number] Arrival date & time: 11/04/15  1945  By signing my name below, I, Arianna Nassar, attest that this documentation has been prepared under the direction and in the presence of Geoffery Lyons, MD.  Electronically Signed: Octavia Heir, ED Scribe. 11/04/15. 11:34 PM.    History   Chief Complaint Chief Complaint  Patient presents with  . Foot Pain    The history is provided by the patient. No language interpreter was used.   HPI Comments: Jamie Clark is a 65 y.o. male who has a PMhx of DM, hepatitis C, HIV and HTN presents to the Emergency Department complaining of constant, gradual worsening, moderate left foot pain with associated swelling onset one month ago. Pt has associated drainage to the area. He states he was diagnosed with a really bad case of athlete's foot in July and notes his foot has gotten progressively worse. Pt was seen at [facility] and by his PCP for the same problems and was prescribed Keflex and more antibiotics. He states his foot became better after taking the antibiotics but notes having drainage and foul odor that started yesterday. He says he called his PCP yesterday and she told him to come to the ED to be evaluated once drainage was seen. He is not on any anti-fungal medication. Pt denies any injury to his foot.   Past Medical History:  Diagnosis Date  . Diabetes mellitus without complication (HCC)   . Hepatitis C, chronic (HCC)   . HIV positive (HCC)   . Hypertension     Patient Active Problem List   Diagnosis Date Noted  . Acute pain of left foot 04/30/2014  . Hyperglycemia 04/30/2014  . Cellulitis and abscess of left foot 04/30/2014  . Acute renal failure (HCC) 04/30/2014  . Tinea pedis of left foot 10/23/2013  . Phlebitis 04/01/2011    Class: History of  . INSOMNIA 04/14/2010  . Dyslipidemia 01/17/2009  . HTN (hypertension) 01/17/2009  . HIV infection (HCC) 12/18/2008  . Chronic viral hepatitis C  (HCC) 07/13/2007    History reviewed. No pertinent surgical history.     Home Medications    Prior to Admission medications   Medication Sig Start Date End Date Taking? Authorizing Provider  cephALEXin (KEFLEX) 500 MG capsule Take 1 capsule (500 mg total) by mouth 4 (four) times daily. 09/26/15   Joycie Peek, PA-C  doxycycline (VIBRA-TABS) 100 MG tablet Take 1 tablet (100 mg total) by mouth 2 (two) times daily. 10/01/15   Judyann Munson, MD  hydrochlorothiazide (HYDRODIURIL) 25 MG tablet TAKE 1 TABLET(25 MG) BY MOUTH AT BEDTIME 07/08/15   Judyann Munson, MD  HYDROcodone-acetaminophen (NORCO) 5-325 MG tablet Take 1 tablet by mouth every 6 (six) hours as needed for moderate pain. 10/01/15   Judyann Munson, MD  lisinopril (PRINIVIL,ZESTRIL) 20 MG tablet TAKE 1/2 TABLET BY MOUTH DAILY UNTIL NEXT APPOINTMENT OR 1 TABLET DAILY AS DIRECTED 11/04/15   Judyann Munson, MD  ondansetron (ZOFRAN ODT) 4 MG disintegrating tablet Take 1 tablet (4 mg total) by mouth every 8 (eight) hours as needed for nausea or vomiting. 02/03/15   Barrett Henle, PA-C  oxymetazoline (AFRIN NASAL SPRAY) 0.05 % nasal spray Place 1 spray into both nostrils 2 (two) times daily. 02/03/15   Barrett Henle, PA-C  terbinafine (LAMISIL AT) 1 % cream Apply 1 application topically 2 (two) times daily. To feet 09/26/15   Joycie Peek, PA-C  TRIUMEQ 600-50-300 MG tablet TAKE 1 TABLET BY MOUTH  DAILY 08/12/15   Judyann Munsonynthia Snider, MD    Family History History reviewed. No pertinent family history.  Social History Social History  Substance Use Topics  . Smoking status: Current Every Day Smoker    Packs/day: 0.15    Types: Cigarettes  . Smokeless tobacco: Never Used     Comment: not ready to quit - cutting back  . Alcohol use No     Allergies   Review of patient's allergies indicates no known allergies.   Review of Systems Review of Systems  A complete 10 system review of systems was obtained and all  systems are negative except as noted in the HPI and PMH.   Physical Exam Updated Vital Signs BP 144/89   Pulse 66   Temp 98.2 F (36.8 C) (Oral)   Resp 20   Ht 5\' 7"  (1.702 m)   Wt 175 lb (79.4 kg)   SpO2 93%   BMI 27.41 kg/m   Physical Exam  Constitutional: He is oriented to person, place, and time. He appears well-developed and well-nourished.  HENT:  Head: Normocephalic.  Eyes: EOM are normal.  Neck: Normal range of motion.  Pulmonary/Chest: Effort normal.  Abdominal: He exhibits no distension.  Musculoskeletal: Normal range of motion. He exhibits edema.  Left foot is noted to have skin breakdown between each toe along with callused skin throughout the distal plantar surface of the foot. There is warmth and erythema to the dorsum of the foot. DP pulses are strong and easily palpable.  Neurological: He is alert and oriented to person, place, and time.  Psychiatric: He has a normal mood and affect.  Nursing note and vitals reviewed.    ED Treatments / Results  DIAGNOSTIC STUDIES: Oxygen Saturation is 93% on RA, low by my interpretation.  COORDINATION OF CARE:  11:31 PM Discussed treatment plan which includes doxycycline and diflucan with pt at bedside and pt agreed to plan.  Labs (all labs ordered are listed, but only abnormal results are displayed) Labs Reviewed  CBC WITH DIFFERENTIAL/PLATELET - Abnormal; Notable for the following:       Result Value   Neutro Abs 1.6 (*)    All other components within normal limits  COMPREHENSIVE METABOLIC PANEL - Abnormal; Notable for the following:    CO2 20 (*)    Glucose, Bld 134 (*)    Albumin 3.3 (*)    All other components within normal limits  I-STAT CG4 LACTIC ACID, ED - Abnormal; Notable for the following:    Lactic Acid, Venous 2.10 (*)    All other components within normal limits  I-STAT CG4 LACTIC ACID, ED    EKG  EKG Interpretation None       Radiology Dg Foot Complete Left  Result Date:  11/04/2015 CLINICAL DATA:  Acute onset of left foot infection. Initial encounter. EXAM: LEFT FOOT - COMPLETE 3+ VIEW COMPARISON:  Left foot radiographs performed 08/12/2015 FINDINGS: There is no evidence of fracture or dislocation. There is no evidence of osseous erosion. The joint spaces are preserved. There is no evidence of talar subluxation; the subtalar joint is unremarkable in appearance. A small os peroneum is noted. Small plantar and posterior calcaneal spurs are seen. No significant soft tissue abnormalities are seen. IMPRESSION: 1. No evidence of fracture or dislocation. No evidence of osseous erosion. 2. Small os peroneum noted. Electronically Signed   By: Roanna RaiderJeffery  Chang M.D.   On: 11/04/2015 21:29    Procedures Procedures (including critical care time)  Medications Ordered  in ED Medications - No data to display   Initial Impression / Assessment and Plan / ED Course  I have reviewed the triage vital signs and the nursing notes.  Pertinent labs & imaging results that were available during my care of the patient were reviewed by me and considered in my medical decision making (see chart for details).  Clinical Course      Final Clinical Impressions(s) / ED Diagnoses   Final diagnoses:  Infection    New Prescriptions New Prescriptions   No medications on file   Patient presents with foot pain and swelling. He has appears to be a bad case of tinea pedis with superinfection/cellulitis. This will be treated with oral Diflucan and doxycycline as this has worked in the past. He is to follow-up with his primary Dr. to discuss a referral to a podiatrist if not improving.  I personally performed the services described in this documentation, which was scribed in my presence. The recorded information has been reviewed and is accurate.       Geoffery Lyonsouglas Camerin Jimenez, MD 11/05/15 [phone]

## 2015-11-04 NOTE — Discharge Instructions (Signed)
Diflucan and doxycycline as prescribed.  Follow-up with your primary Dr. if not improving in the next week to discuss a referral to a podiatrist.

## 2015-11-04 NOTE — ED Triage Notes (Signed)
Pt c/o recurrent left foot infection. Pt states foot started draining two days ago. Pt states he just finished some antibiotics a few days ago.

## 2015-11-05 ENCOUNTER — Encounter: Payer: Self-pay | Admitting: Pharmacist

## 2015-11-05 DIAGNOSIS — B20 Human immunodeficiency virus [HIV] disease: Secondary | ICD-10-CM

## 2015-11-05 NOTE — Progress Notes (Signed)
Patient ID: Jamie Clark, male   DOB: 10/15/1950, 65 y.o.   MRN: [id number]  Jamie Clark came in as a walk in and had a medication question.  He went to the ED last night because his foot starting draining again.  He came in and said that [facility] on [address] told him that his Doxy and fluconazole Rx (that the ED gave him) were not covered under [employer] and his pay would be ~$80. [facility] under [employer].  He will go back to [facility] to see if he can get them filled this time, and he will call me if he has any more issues.

## 2015-11-14 ENCOUNTER — Ambulatory Visit: Payer: Self-pay | Admitting: Internal Medicine

## 2015-12-02 ENCOUNTER — Other Ambulatory Visit: Payer: Self-pay

## 2015-12-12 ENCOUNTER — Encounter: Payer: Self-pay | Admitting: Internal Medicine

## 2015-12-12 ENCOUNTER — Ambulatory Visit (INDEPENDENT_AMBULATORY_CARE_PROVIDER_SITE_OTHER): Payer: Self-pay | Admitting: Internal Medicine

## 2015-12-12 VITALS — BP 169/89 | HR 71 | Temp 97.5°F | Ht 68.0 in | Wt 173.5 lb

## 2015-12-12 DIAGNOSIS — Z23 Encounter for immunization: Secondary | ICD-10-CM

## 2015-12-12 DIAGNOSIS — Z113 Encounter for screening for infections with a predominantly sexual mode of transmission: Secondary | ICD-10-CM

## 2015-12-12 DIAGNOSIS — B20 Human immunodeficiency virus [HIV] disease: Secondary | ICD-10-CM

## 2015-12-12 LAB — COMPLETE METABOLIC PANEL WITH GFR
ALK PHOS: 78 U/L (ref 40–115)
ALT: 15 U/L (ref 9–46)
AST: 19 U/L (ref 10–35)
Albumin: 3.8 g/dL (ref 3.6–5.1)
BUN: 17 mg/dL (ref 7–25)
CALCIUM: 8.9 mg/dL (ref 8.6–10.3)
CHLORIDE: 110 mmol/L (ref 98–110)
CO2: 21 mmol/L (ref 20–31)
Creat: 1.1 mg/dL (ref 0.70–1.25)
GFR, EST AFRICAN AMERICAN: 82 mL/min (ref >=60)
GFR, Est Non African American: 71 mL/min (ref >=60)
Glucose, Bld: 129 mg/dL — ABNORMAL HIGH (ref 65–99)
POTASSIUM: 3.6 mmol/L (ref 3.5–5.3)
Sodium: 141 mmol/L (ref 135–146)
Total Bilirubin: 0.4 mg/dL (ref 0.2–1.2)
Total Protein: 7.3 g/dL (ref 6.1–8.1)

## 2015-12-12 MED ORDER — TRAZODONE HCL 50 MG PO TABS: 50.0000 mg | ORAL_TABLET | Freq: Every day | ORAL | 3 refills | Status: DC

## 2015-12-12 MED ORDER — TERBINAFINE HCL 250 MG PO TABS: 250.0000 mg | ORAL_TABLET | Freq: Every day | ORAL | 3 refills | Status: DC

## 2015-12-12 NOTE — Progress Notes (Signed)
RFV: hiv visit  Patient ID: Jamie Clark, male   DOB: 04-15-1950, 65 y.o.   MRN: [id number]  HPI Jamie Clark is a 65yo M with hiv disease, cd 4 count of 850/VL<62, also has htn, ckd 2, hep c treated. He has recurrent tinea pedis with bacterial  secondary infection for which he went to the ED for evaluation last month. He was given a course of doxycycline and fluconazole which has helped. He also is keeping his foot in dry sock and in open ortho boot. Appears steadily improving  He doesn't have insurance nor qualify for orange card  Outpatient Encounter Prescriptions as of 12/12/2015  Medication Sig  . hydrochlorothiazide (HYDRODIURIL) 25 MG tablet TAKE 1 TABLET(25 MG) BY MOUTH AT BEDTIME  . lisinopril (PRINIVIL,ZESTRIL) 20 MG tablet TAKE 1/2 TABLET BY MOUTH DAILY UNTIL NEXT APPOINTMENT OR 1 TABLET DAILY AS DIRECTED  . oxymetazoline (AFRIN NASAL SPRAY) 0.05 % nasal spray Place 1 spray into both nostrils 2 (two) times daily.  . TRIUMEQ 600-50-300 MG tablet TAKE 1 TABLET BY MOUTH DAILY  . [DISCONTINUED] cephALEXin (KEFLEX) 500 MG capsule Take 1 capsule (500 mg total) by mouth 4 (four) times daily.  . [DISCONTINUED] doxycycline (VIBRAMYCIN) 100 MG capsule Take 1 capsule (100 mg total) by mouth 2 (two) times daily.  . [DISCONTINUED] fluconazole (DIFLUCAN) 200 MG tablet Take 1 tablet (200 mg total) by mouth daily.  . [DISCONTINUED] HYDROcodone-acetaminophen (NORCO) 5-325 MG tablet Take 1 tablet by mouth every 6 (six) hours as needed for moderate pain.  . [DISCONTINUED] ondansetron (ZOFRAN ODT) 4 MG disintegrating tablet Take 1 tablet (4 mg total) by mouth every 8 (eight) hours as needed for nausea or vomiting.  . [DISCONTINUED] terbinafine (LAMISIL AT) 1 % cream Apply 1 application topically 2 (two) times daily. To feet   No facility-administered encounter medications on file as of 12/12/2015.      Patient Active Problem List   Diagnosis Date Noted  . Acute pain of left foot 04/30/2014  .  Hyperglycemia 04/30/2014  . Cellulitis and abscess of left foot 04/30/2014  . Acute renal failure (HCC) 04/30/2014  . Tinea pedis of left foot 10/23/2013  . Phlebitis 04/01/2011    Class: History of  . INSOMNIA 04/14/2010  . Dyslipidemia 01/17/2009  . HTN (hypertension) 01/17/2009  . HIV infection (HCC) 12/18/2008  . Chronic viral hepatitis C (HCC) 07/13/2007     Health Maintenance Due  Topic Date Due  . TETANUS/TDAP  01/22/1971  . COLONOSCOPY  01/21/2002  . ZOSTAVAX  01/22/2012  . INFLUENZA VACCINE  10/22/2015     Review of Systems + left foot pain. Otherwise 10 point ros is negative Physical Exam   BP (!) 169/89 (BP Location: Right Arm, Cuff Size: Normal)   Pulse 71   Temp 97.5 F (36.4 C) (Oral)   Ht 5\' 8"  (1.727 m)   Wt 78.7 kg (173 lb 8 oz)   BMI 26.38 kg/m  Physical Exam  Constitutional: He is oriented to person, place, and time. He appears well-developed and well-nourished. No distress.  HENT:  Mouth/Throat: Oropharynx is clear and moist. No oropharyngeal exudate.  Cardiovascular: Normal rate, regular rhythm and normal heart sounds. Exam reveals no gallop and no friction rub.  No murmur heard.  Pulmonary/Chest: Effort normal and breath sounds normal. No respiratory distress. He has no wheezes.  Abdominal: Soft. Bowel sounds are normal. He exhibits no distension. There is no tenderness.  Lymphadenopathy:  He has no cervical adenopathy.  Neurological: He is alert  and oriented to person, place, and time.  Skin: left foot still has evidence of tinea pedis. Moisture between toes. No excessive erythema. Psychiatric: He has a normal mood and affect. His behavior is normal.    Lab Results  Component Value Date   CD4TCELL 44 06/11/2015   Lab Results  Component Value Date   CD4TABS 850 06/11/2015   CD4TABS 870 11/21/2014   CD4TABS 980 09/06/2014   Lab Results  Component Value Date   HIV1RNAQUANT 62 (H) 06/11/2015   Lab Results  Component Value Date    HEPBSAB NEG 05/06/2010   No results found for: RPR  CBC Lab Results  Component Value Date   WBC 4.3 11/04/2015   RBC 4.49 11/04/2015   HGB 13.8 11/04/2015   HCT 41.6 11/04/2015   PLT 296 11/04/2015   MCV 92.7 11/04/2015   MCH 30.7 11/04/2015   MCHC 33.2 11/04/2015   RDW 13.2 11/04/2015   LYMPHSABS 2.2 11/04/2015   MONOABS 0.3 11/04/2015   EOSABS 0.2 11/04/2015   BASOSABS 0.0 11/04/2015   BMET Lab Results  Component Value Date   NA 138 11/04/2015   K 3.9 11/04/2015   CL 110 11/04/2015   CO2 20 (L) 11/04/2015   GLUCOSE 134 (H) 11/04/2015   BUN 14 11/04/2015   CREATININE 1.13 11/04/2015   CALCIUM 9.2 11/04/2015   GFRNONAA >60 11/04/2015   GFRAA >60 11/04/2015     Assessment and Plan  Onychomycosis = will do a course of terbanifine. Recommend to go to see dr. Ardelle AntonWagoner for evaluation  Insomnia = will do a trial of trazadone  hiv = cotninue on current regimen. He had low level viral load at last visit that is likely a blip since he is so adherent to his meds. We will do labs  Health maintenance = will give flu shot today

## 2015-12-12 NOTE — Patient Instructions (Signed)
Please see dr. Ardelle AntonWagoner at [facility] [phone]

## 2015-12-13 LAB — CBC WITH DIFFERENTIAL/PLATELET
BASOS ABS: 43 {cells}/uL (ref 0–200)
Basophils Relative: 1 %
EOS ABS: 172 {cells}/uL (ref 15–500)
Eosinophils Relative: 4 %
HEMATOCRIT: 41.6 % (ref 38.5–50.0)
Hemoglobin: 14 g/dL (ref 13.2–17.1)
LYMPHS PCT: 47 %
Lymphs Abs: 2021 cells/uL (ref 850–3900)
MCH: 30.4 pg (ref 27.0–33.0)
MCHC: 33.7 g/dL (ref 32.0–36.0)
MCV: 90.4 fL (ref 80.0–100.0)
MONO ABS: 301 {cells}/uL (ref 200–950)
MPV: 9.9 fL (ref 7.5–12.5)
Monocytes Relative: 7 %
NEUTROS PCT: 41 %
Neutro Abs: 1763 cells/uL (ref 1500–7800)
Platelets: 267 10*3/uL (ref 140–400)
RBC: 4.6 MIL/uL (ref 4.20–5.80)
RDW: 13.9 % (ref 11.0–15.0)
WBC: 4.3 10*3/uL (ref 3.8–10.8)

## 2015-12-13 LAB — T-HELPER CELL (CD4) - (RCID CLINIC ONLY)
CD4 T CELL ABS: 810 /uL (ref 400–2700)
CD4 T CELL HELPER: 41 % (ref 33–55)

## 2015-12-13 LAB — RPR

## 2015-12-16 LAB — HIV-1 RNA QUANT-NO REFLEX-BLD

## 2015-12-28 ENCOUNTER — Other Ambulatory Visit: Payer: Self-pay | Admitting: Internal Medicine

## 2016-01-10 ENCOUNTER — Emergency Department (HOSPITAL_COMMUNITY)
Admission: EM | Admit: 2016-01-10 | Discharge: 2016-01-10 | Disposition: A | Discharge status: Home / Self Care | Payer: No Typology Code available for payment source | Attending: Emergency Medicine | Admitting: Emergency Medicine

## 2016-01-10 ENCOUNTER — Encounter (HOSPITAL_COMMUNITY): Payer: Self-pay

## 2016-01-10 ENCOUNTER — Emergency Department (HOSPITAL_COMMUNITY): Payer: No Typology Code available for payment source

## 2016-01-10 DIAGNOSIS — S46812A Strain of other muscles, fascia and tendons at shoulder and upper arm level, left arm, initial encounter: Secondary | ICD-10-CM

## 2016-01-10 DIAGNOSIS — I10 Essential (primary) hypertension: Secondary | ICD-10-CM | POA: Diagnosis not present

## 2016-01-10 DIAGNOSIS — M542 Cervicalgia: Secondary | ICD-10-CM

## 2016-01-10 DIAGNOSIS — Y939 Activity, unspecified: Secondary | ICD-10-CM | POA: Insufficient documentation

## 2016-01-10 DIAGNOSIS — F17210 Nicotine dependence, cigarettes, uncomplicated: Secondary | ICD-10-CM | POA: Insufficient documentation

## 2016-01-10 DIAGNOSIS — S4992XA Unspecified injury of left shoulder and upper arm, initial encounter: Secondary | ICD-10-CM | POA: Diagnosis present

## 2016-01-10 DIAGNOSIS — Y92410 Unspecified street and highway as the place of occurrence of the external cause: Secondary | ICD-10-CM | POA: Diagnosis not present

## 2016-01-10 DIAGNOSIS — E119 Type 2 diabetes mellitus without complications: Secondary | ICD-10-CM | POA: Diagnosis not present

## 2016-01-10 DIAGNOSIS — R51 Headache: Secondary | ICD-10-CM | POA: Diagnosis not present

## 2016-01-10 DIAGNOSIS — Y999 Unspecified external cause status: Secondary | ICD-10-CM | POA: Diagnosis not present

## 2016-01-10 MED ORDER — NAPROXEN 500 MG PO TABS: 500.0000 mg | ORAL_TABLET | Freq: Two times a day (BID) | ORAL | 0 refills | Status: DC

## 2016-01-10 MED ORDER — METHOCARBAMOL 500 MG PO TABS
1000.0000 mg | ORAL_TABLET | Freq: Once | ORAL | Status: AC
  Administered 2016-01-10: 1000 mg via ORAL
  Filled 2016-01-10: qty 2

## 2016-01-10 MED ORDER — METHOCARBAMOL 500 MG PO TABS: 500.0000 mg | ORAL_TABLET | Freq: Three times a day (TID) | ORAL | 0 refills | Status: DC | PRN

## 2016-01-10 MED ORDER — ONDANSETRON 4 MG PO TBDP
4.0000 mg | ORAL_TABLET | Freq: Once | ORAL | Status: AC
  Administered 2016-01-10: 4 mg via ORAL
  Filled 2016-01-10: qty 1

## 2016-01-10 MED ORDER — HYDROCODONE-ACETAMINOPHEN 5-325 MG PO TABS
2.0000 | ORAL_TABLET | Freq: Once | ORAL | Status: AC
  Administered 2016-01-10: 2 via ORAL
  Filled 2016-01-10: qty 2
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
## 2016-01-10 NOTE — ED Notes (Signed)
Patient transported to CT 
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
## 2016-01-26 ENCOUNTER — Other Ambulatory Visit: Payer: Self-pay | Admitting: Internal Medicine

## 2016-01-26 DIAGNOSIS — B20 Human immunodeficiency virus [HIV] disease: Secondary | ICD-10-CM

## 2016-01-30 ENCOUNTER — Ambulatory Visit (HOSPITAL_COMMUNITY)
Admission: EM | Admit: 2016-01-30 | Discharge: 2016-01-30 | Disposition: A | Discharge status: Home / Self Care | Payer: Self-pay | Attending: Emergency Medicine | Admitting: Emergency Medicine

## 2016-01-30 ENCOUNTER — Encounter (HOSPITAL_COMMUNITY): Payer: Self-pay | Admitting: Emergency Medicine

## 2016-01-30 DIAGNOSIS — J111 Influenza due to unidentified influenza virus with other respiratory manifestations: Secondary | ICD-10-CM

## 2016-01-30 LAB — POCT RAPID STREP A: Streptococcus, Group A Screen (Direct): NEGATIVE

## 2016-01-30 MED ORDER — ONDANSETRON HCL 4 MG PO TABS: 4.0000 mg | ORAL_TABLET | Freq: Three times a day (TID) | ORAL | 0 refills | Status: DC | PRN

## 2016-01-30 MED ORDER — OSELTAMIVIR PHOSPHATE 75 MG PO CAPS: 75.0000 mg | ORAL_CAPSULE | Freq: Two times a day (BID) | ORAL | 0 refills | Status: DC

## 2016-01-30 MED ORDER — POLYMYXIN B-TRIMETHOPRIM 10000-0.1 UNIT/ML-% OP SOLN: 1.0000 [drp] | Freq: Four times a day (QID) | OPHTHALMIC | 0 refills | Status: DC

## 2016-01-30 MED ORDER — IBUPROFEN 800 MG PO TABS
ORAL_TABLET | ORAL | Status: AC
  Filled 2016-01-30: qty 1

## 2016-01-30 MED ORDER — IBUPROFEN 800 MG PO TABS
800.0000 mg | ORAL_TABLET | Freq: Once | ORAL | Status: AC
  Administered 2016-01-30: 800 mg via ORAL

## 2016-01-30 NOTE — ED Provider Notes (Signed)
[facility]    CSN: [id number] Arrival date & time: 01/30/16  1201     History   Chief Complaint Chief Complaint  Patient presents with  . Headache    HPI Jamie Clark is a 65 y.o. male.   HPI  He is a [age] year old man here for headache and body aches. He reports a one-day history of headaches, body aches, cold chills. He also reports some discomfort in the right eye intermittently. He also reports some congestion and a sore throat. He denies any shortness of breath, cough, or wheeze. He does have some nausea and diarrhea. No vomiting. He did tolerate some orange juice today. His appetite is decreased. He received his flu shot in September. He does have HIV.  Past Medical History:  Diagnosis Date  . Diabetes mellitus without complication (HCC)   . Hepatitis C, chronic (HCC)   . HIV positive (HCC)   . Hypertension     Patient Active Problem List   Diagnosis Date Noted  . Acute pain of left foot 04/30/2014  . Hyperglycemia 04/30/2014  . Cellulitis and abscess of left foot 04/30/2014  . Acute renal failure (HCC) 04/30/2014  . Tinea pedis of left foot 10/23/2013  . Phlebitis 04/01/2011    Class: History of  . INSOMNIA 04/14/2010  . Dyslipidemia 01/17/2009  . HTN (hypertension) 01/17/2009  . HIV infection (HCC) 12/18/2008  . Chronic viral hepatitis C (HCC) 07/13/2007    History reviewed. No pertinent surgical history.     Home Medications    Prior to Admission medications   Medication Sig Start Date End Date Taking? Authorizing Provider  hydrochlorothiazide (HYDRODIURIL) 25 MG tablet TAKE 1 TABLET(25 MG) BY MOUTH AT BEDTIME 12/30/15  Yes Judyann Munsonynthia Snider, MD  lisinopril (PRINIVIL,ZESTRIL) 20 MG tablet TAKE 1/2 TABLET BY MOUTH DAILY UNTIL NEXT APPOINTMENT OR 1 TABLET DAILY AS DIRECTED 11/04/15  Yes Judyann Munsonynthia Snider, MD  lisinopril (PRINIVIL,ZESTRIL) 20 MG tablet Take 10 mg by mouth daily.   Yes Historical Provider, MD  naproxen (NAPROSYN) 500 MG tablet  Take 1 tablet (500 mg total) by mouth 2 (two) times daily. 01/10/16  Yes Francoise CeoWarren S Jones, DO  TRIUMEQ 600-50-300 MG tablet TAKE 1 TABLET BY MOUTH DAILY 01/27/16  Yes Judyann Munsonynthia Snider, MD  methocarbamol (ROBAXIN) 500 MG tablet Take 1 tablet (500 mg total) by mouth every 8 (eight) hours as needed for muscle spasms. 01/10/16   Francoise CeoWarren S Jones, DO  ondansetron (ZOFRAN) 4 MG tablet Take 1 tablet (4 mg total) by mouth every 8 (eight) hours as needed for nausea or vomiting. 01/30/16   Charm RingsErin J Shakesha Soltau, MD  oseltamivir (TAMIFLU) 75 MG capsule Take 1 capsule (75 mg total) by mouth every 12 (twelve) hours. 01/30/16   Charm RingsErin J Suesan Mohrmann, MD  oxymetazoline (AFRIN NASAL SPRAY) 0.05 % nasal spray Place 1 spray into both nostrils 2 (two) times daily. Patient not taking: Reported on 01/30/2016 02/03/15   Barrett HenleNicole Elizabeth Nadeau, PA-C  terbinafine (LAMISIL) 250 MG tablet Take 1 tablet (250 mg total) by mouth daily. Patient not taking: Reported on 01/30/2016 12/12/15   Judyann Munsonynthia Snider, MD  traZODone (DESYREL) 50 MG tablet Take 1 tablet (50 mg total) by mouth at bedtime. Start with 1/2 tab at bedtime Patient not taking: Reported on 01/30/2016 12/12/15   Judyann Munsonynthia Snider, MD  trimethoprim-polymyxin b [facility](POLYTRIM) ophthalmic solution Place 1 drop into the right eye 4 (four) times daily. For 5 days 01/30/16   Charm RingsErin J Terrah Decoster, MD    Family History History reviewed.  No pertinent family history.  Social History Social History  Substance Use Topics  . Smoking status: Current Every Day Smoker    Packs/day: 0.15    Types: Cigarettes  . Smokeless tobacco: Never Used     Comment: not ready to quit - cutting back  . Alcohol use No     Allergies   Patient has no known allergies.   Review of Systems Review of Systems  Constitutional: Positive for appetite change, chills and fatigue. Negative for fever.  HENT: Positive for congestion and sore throat. Negative for ear pain, rhinorrhea and trouble swallowing.   Respiratory: Negative for cough  and shortness of breath.   Gastrointestinal: Positive for diarrhea and nausea. Negative for vomiting.  Musculoskeletal: Positive for myalgias.  Skin: Negative for rash.  Neurological: Positive for headaches.     Physical Exam Triage Vital Signs ED Triage Vitals  Enc Vitals Group     BP 01/30/16 1214 140/93     Pulse Rate 01/30/16 1214 88     Resp 01/30/16 1214 16     Temp 01/30/16 1214 99 F (37.2 C)     Temp Source 01/30/16 1214 Oral     SpO2 01/30/16 1214 98 %     Weight --      Height --      Head Circumference --      Peak Flow --      Pain Score 01/30/16 1218 8     Pain Loc --      Pain Edu? --      Excl. in GC? --    No data found.   Updated Vital Signs BP 140/93 (BP Location: Left Arm)   Pulse 88   Temp 99 F (37.2 C) (Oral)   Resp 16   SpO2 98%   Visual Acuity Right Eye Distance:   Left Eye Distance:   Bilateral Distance:    Right Eye Near:   Left Eye Near:    Bilateral Near:     Physical Exam  Constitutional: He is oriented to person, place, and time. He appears well-developed and well-nourished. No distress.  HENT:  Mouth/Throat: Oropharyngeal exudate present.  TMs normal bilaterally. Nasal mucosa is erythematous and edematous. Right tonsil is erythematous and swollen with exudate. Left tonsil normal.  Eyes: EOM are normal. Pupils are equal, round, and reactive to light.  Right conjunctiva injected  Neck: Neck supple.  Cardiovascular: Normal rate, regular rhythm and normal heart sounds.   No murmur heard. Pulmonary/Chest: Effort normal and breath sounds normal. No respiratory distress. He has no wheezes. He has no rales.  Lymphadenopathy:    He has no cervical adenopathy.  Neurological: He is alert and oriented to person, place, and time.     UC Treatments / Results  Labs (all labs ordered are listed, but only abnormal results are displayed) Labs Reviewed  POCT RAPID STREP A    EKG  EKG Interpretation None       Radiology No  results found.  Procedures Procedures (including critical care time)  Medications Ordered in UC Medications  ibuprofen (ADVIL,MOTRIN) tablet 800 mg (800 mg Oral Given 01/30/16 1236)     Initial Impression / Assessment and Plan / UC Course  I have reviewed the triage vital signs and the nursing notes.  Pertinent labs & imaging results that were available during my care of the patient were reviewed by me and considered in my medical decision making (see chart for details).  Clinical Course  Strep negative. Symptoms are consistent with flu. We'll treat with Tamiflu. Zofran as needed for nausea. Polytrim for conjunctivitis.  Final Clinical Impressions(s) / UC Diagnoses   Final diagnoses:  Influenza    New Prescriptions New Prescriptions   ONDANSETRON (ZOFRAN) 4 MG TABLET    Take 1 tablet (4 mg total) by mouth every 8 (eight) hours as needed for nausea or vomiting.   OSELTAMIVIR (TAMIFLU) 75 MG CAPSULE    Take 1 capsule (75 mg total) by mouth every 12 (twelve) hours.   TRIMETHOPRIM-POLYMYXIN B (POLYTRIM) OPHTHALMIC SOLUTION    Place 1 drop into the right eye 4 (four) times daily. For 5 days     Charm Rings, MD 01/30/16 1254

## 2016-01-30 NOTE — Discharge Instructions (Signed)
You likely have the flu. Get plenty of rest and drink plenty of fluids. Take Tamiflu twice a day for 5 days. Take Tylenol or ibuprofen to help with the headaches and body aches. Use Zofran every 8 hours as needed for nausea. Use the Polytrim drops in ear right eye 4 times a day for the next 5 days. Follow-up if not improving in 1 week.

## 2016-01-30 NOTE — ED Triage Notes (Signed)
[facility] a [facility] and body [facility]hes. The patient reported that he has had a headache with general body aches and chills x 2 days.

## 2016-02-02 LAB — CULTURE, GROUP A STREP (THRC)

## 2016-02-22 ENCOUNTER — Ambulatory Visit (INDEPENDENT_AMBULATORY_CARE_PROVIDER_SITE_OTHER): Payer: Self-pay

## 2016-02-22 ENCOUNTER — Ambulatory Visit (HOSPITAL_COMMUNITY)
Admission: EM | Admit: 2016-02-22 | Discharge: 2016-02-22 | Disposition: A | Discharge status: Home / Self Care | Payer: Self-pay | Attending: Emergency Medicine | Admitting: Emergency Medicine

## 2016-02-22 ENCOUNTER — Encounter (HOSPITAL_COMMUNITY): Payer: Self-pay | Admitting: Emergency Medicine

## 2016-02-22 DIAGNOSIS — J0120 Acute ethmoidal sinusitis, unspecified: Secondary | ICD-10-CM

## 2016-02-22 MED ORDER — PREDNISONE 50 MG PO TABS: ORAL_TABLET | ORAL | 0 refills | Status: DC

## 2016-02-22 MED ORDER — AMOXICILLIN-POT CLAVULANATE 875-125 MG PO TABS: 1.0000 | ORAL_TABLET | Freq: Two times a day (BID) | ORAL | 0 refills | Status: DC

## 2016-02-22 MED ORDER — FLUTICASONE PROPIONATE 50 MCG/ACT NA SUSP: 1.0000 | Freq: Every day | NASAL | 2 refills | Status: DC

## 2016-02-22 NOTE — ED Triage Notes (Signed)
Here for cold sx onset 3 days associated w/facial pressure, diaphoresis, diarrhea, dry cough, nasal congestion/drainage  A&O x4... NAD

## 2016-02-22 NOTE — Discharge Instructions (Signed)
You have a sinus infection. Take prednisone daily for 5 days. Take Augmentin twice a day for 10 days. To prevent this from happening again, use Flonase every single day. Also use nasal saline spray whenever you feel like it's starting to flareup. Follow-up as needed.

## 2016-02-22 NOTE — ED Provider Notes (Signed)
[facility]    CSN: [id number] Arrival date & time: 02/22/16  1441     History   Chief Complaint Chief Complaint  Patient presents with  . URI    HPI Jamie Clark is a 65 y.o. male.   HPI  He is a 65 year old man here for a 3 day history of sinus issues. He reports nasal congestion, rhinorrhea, sore throat, and mild cough. He also reports feeling a lot of pain and pressure in both the right cheek and right forehead. This radiates to the eye. He has tried multiple over-the-counter medications as well as a humidifier without much improvement. Today at work he had an episode where he felt weak and became slightly diaphoretic. This was associated with a loose bowel movement. Symptoms resolved in about 30 minutes. No known fevers. He does report a decreased appetite, but no nausea or vomiting.  Past Medical History:  Diagnosis Date  . Diabetes mellitus without complication (HCC)   . Hepatitis C, chronic (HCC)   . HIV positive (HCC)   . Hypertension     Patient Active Problem List   Diagnosis Date Noted  . Acute pain of left foot 04/30/2014  . Hyperglycemia 04/30/2014  . Cellulitis and abscess of left foot 04/30/2014  . Acute renal failure (HCC) 04/30/2014  . Tinea pedis of left foot 10/23/2013  . Phlebitis 04/01/2011    Class: History of  . INSOMNIA 04/14/2010  . Dyslipidemia 01/17/2009  . HTN (hypertension) 01/17/2009  . HIV infection (HCC) 12/18/2008  . Chronic viral hepatitis C (HCC) 07/13/2007    History reviewed. No pertinent surgical history.     Home Medications    Prior to Admission medications   Medication Sig Start Date End Date Taking? Authorizing Provider  hydrochlorothiazide (HYDRODIURIL) 25 MG tablet TAKE 1 TABLET(25 MG) BY MOUTH AT BEDTIME 12/30/15  Yes Judyann Munsonynthia Snider, MD  lisinopril (PRINIVIL,ZESTRIL) 20 MG tablet TAKE 1/2 TABLET BY MOUTH DAILY UNTIL NEXT APPOINTMENT OR 1 TABLET DAILY AS DIRECTED 11/04/15  Yes Judyann Munsonynthia Snider, MD    TRIUMEQ 600-50-300 MG tablet TAKE 1 TABLET BY MOUTH DAILY 01/27/16  Yes Judyann Munsonynthia Snider, MD  amoxicillin-clavulanate (AUGMENTIN) 875-125 MG tablet Take 1 tablet by mouth 2 (two) times daily. 02/22/16   Charm RingsErin J Jlee Harkless, MD  fluticasone (FLONASE) 50 MCG/ACT nasal spray Place 1 spray into both nostrils daily. 02/22/16   Charm RingsErin J Vern Prestia, MD  predniSONE (DELTASONE) 50 MG tablet Take 1 pill daily for 5 days. 02/22/16   Charm RingsErin J Messiah Ahr, MD    Family History History reviewed. No pertinent family history.  Social History Social History  Substance Use Topics  . Smoking status: Current Every Day Smoker    Packs/day: 0.15    Types: Cigarettes  . Smokeless tobacco: Never Used     Comment: not ready to quit - cutting back  . Alcohol use No     Allergies   Patient has no known allergies.   Review of Systems Review of Systems As in history of present illness  Physical Exam Triage Vital Signs ED Triage Vitals  Enc Vitals Group     BP 02/22/16 1517 122/61     Pulse Rate 02/22/16 1517 92     Resp 02/22/16 1517 20     Temp 02/22/16 1517 98.7 F (37.1 C)     Temp Source 02/22/16 1517 Oral     SpO2 02/22/16 1517 98 %     Weight --      Height --  Head Circumference --      Peak Flow --      Pain Score 02/22/16 1519 8     Pain Loc --      Pain Edu? --      Excl. in GC? --    No data found.   Updated Vital Signs BP 122/61 (BP Location: Left Arm)   Pulse 92   Temp 98.7 F (37.1 C) (Oral)   Resp 20   SpO2 98%   Visual Acuity Right Eye Distance:   Left Eye Distance:   Bilateral Distance:    Right Eye Near:   Left Eye Near:    Bilateral Near:     Physical Exam  Constitutional: He is oriented to person, place, and time. He appears well-developed and well-nourished. No distress.  HENT:  Diffuse sinus tenderness, worse on the right side.  Neck: Neck supple.  Cardiovascular: Normal rate, regular rhythm and normal heart sounds.   No murmur heard. Pulmonary/Chest: Effort normal and  breath sounds normal. No respiratory distress. He has no wheezes. He has no rales.  Lymphadenopathy:    He has no cervical adenopathy.  Neurological: He is alert and oriented to person, place, and time.     UC Treatments / Results  Labs (all labs ordered are listed, but only abnormal results are displayed) Labs Reviewed - No data to display  EKG  EKG Interpretation None       Radiology Dg Sinuses Complete  Result Date: 02/22/2016 CLINICAL DATA:  Sinus pressure and pain. EXAM: PARANASAL SINUSES - COMPLETE 3 + VIEW COMPARISON:  None. FINDINGS: The paranasal sinus are aerated. There appears to be opacification of the RIGHT ethmoid air cells. Otherwise, there is no evidence of sinus opacification, air-fluid levels or mucosal thickening. No significant bone abnormalities are seen. IMPRESSION: Query RIGHT ethmoid sinusitis. CT of the paranasal sinuses is the optimal imaging test to evaluate for paranasal sinus disease. Electronically Signed   By: Elsie StainJohn T Curnes M.D.   On: 02/22/2016 16:20    Procedures Procedures (including critical care time)  Medications Ordered in UC Medications - No data to display   Initial Impression / Assessment and Plan / UC Course  I have reviewed the triage vital signs and the nursing notes.  Pertinent labs & imaging results that were available during my care of the patient were reviewed by me and considered in my medical decision making (see chart for details).  Clinical Course     Treatment for sinusitis with Augmentin and prednisone. Recommended daily Flonase to prevent recurrence. Also discussed nasal saline spray.  Final Clinical Impressions(s) / UC Diagnoses   Final diagnoses:  Acute ethmoidal sinusitis, recurrence not specified    New Prescriptions New Prescriptions   AMOXICILLIN-CLAVULANATE (AUGMENTIN) 875-125 MG TABLET    Take 1 tablet by mouth 2 (two) times daily.   FLUTICASONE (FLONASE) 50 MCG/ACT NASAL SPRAY    Place 1 spray into  both nostrils daily.   PREDNISONE (DELTASONE) 50 MG TABLET    Take 1 pill daily for 5 days.     Charm RingsErin J Jeliyah Middlebrooks, MD 02/22/16 1640

## 2016-03-12 ENCOUNTER — Ambulatory Visit: Payer: Self-pay | Admitting: Internal Medicine

## 2016-03-29 ENCOUNTER — Encounter (HOSPITAL_COMMUNITY): Payer: Self-pay

## 2016-03-29 ENCOUNTER — Emergency Department (HOSPITAL_COMMUNITY)
Admission: EM | Admit: 2016-03-29 | Discharge: 2016-03-29 | Disposition: A | Discharge status: Home / Self Care | Payer: BLUE CROSS/BLUE SHIELD | Attending: Emergency Medicine | Admitting: Emergency Medicine

## 2016-03-29 DIAGNOSIS — I10 Essential (primary) hypertension: Secondary | ICD-10-CM | POA: Diagnosis not present

## 2016-03-29 DIAGNOSIS — E119 Type 2 diabetes mellitus without complications: Secondary | ICD-10-CM | POA: Diagnosis not present

## 2016-03-29 DIAGNOSIS — L089 Local infection of the skin and subcutaneous tissue, unspecified: Secondary | ICD-10-CM | POA: Diagnosis present

## 2016-03-29 DIAGNOSIS — L03115 Cellulitis of right lower limb: Secondary | ICD-10-CM | POA: Insufficient documentation

## 2016-03-29 DIAGNOSIS — F17210 Nicotine dependence, cigarettes, uncomplicated: Secondary | ICD-10-CM | POA: Diagnosis not present

## 2016-03-29 DIAGNOSIS — B353 Tinea pedis: Secondary | ICD-10-CM | POA: Diagnosis not present

## 2016-03-29 MED ORDER — DOXYCYCLINE HYCLATE 100 MG PO CAPS: 100.0000 mg | ORAL_CAPSULE | Freq: Two times a day (BID) | ORAL | 0 refills | Status: DC

## 2016-03-29 MED ORDER — ITRACONAZOLE 100 MG PO CAPS: 200.0000 mg | ORAL_CAPSULE | Freq: Two times a day (BID) | ORAL | 0 refills | Status: DC

## 2016-03-29 NOTE — ED Triage Notes (Signed)
Pt with wound infection on bottom of left foot.  Started as Advertising account executiveathletes feet.  Pt states wound is draining. Started x 4 days ago.

## 2016-03-29 NOTE — ED Provider Notes (Signed)
[facility] Provider Note   CSN: [id number] Arrival date & time: 03/29/16  1104     History   Chief Complaint Chief Complaint  Patient presents with  . Wound Infection    HPI Jamie Clark is a 66 y.o. male.  Patient with history of HIV, last CD4 count in 11/2015 greater than 800, history of diabetes, history of tinea pedis and cellulitis -- presents with complaint of athlete's foot infection on the bilateral feet 4 days ago. Patient wears boots at work. He denies fevers, nausea or vomiting. He has had a small area of tenderness and redness appear on his right lower extremity as well. No streaking. Patient has been trying to keep the areas dry. No other treatments. Symptoms similar but not as severe as recent infections. The onset of this condition was acute. The course is constant. Aggravating factors: none. Alleviating factors: none.        Past Medical History:  Diagnosis Date  . Diabetes mellitus without complication (HCC)   . Hepatitis C, chronic (HCC)   . HIV positive (HCC)   . Hypertension     Patient Active Problem List   Diagnosis Date Noted  . Acute pain of left foot 04/30/2014  . Hyperglycemia 04/30/2014  . Cellulitis and abscess of left foot 04/30/2014  . Acute renal failure (HCC) 04/30/2014  . Tinea pedis of left foot 10/23/2013  . Phlebitis 04/01/2011    Class: History of  . INSOMNIA 04/14/2010  . Dyslipidemia 01/17/2009  . HTN (hypertension) 01/17/2009  . HIV infection (HCC) 12/18/2008  . Chronic viral hepatitis C (HCC) 07/13/2007    History reviewed. No pertinent surgical history.     Home Medications    Prior to Admission medications   Medication Sig Start Date End Date Taking? Authorizing Provider  doxycycline (VIBRAMYCIN) 100 MG capsule Take 1 capsule (100 mg total) by mouth 2 (two) times daily. 03/29/16   Renne Crigler, PA-C  fluticasone (FLONASE) 50 MCG/ACT nasal spray Place 1 spray into both nostrils daily. 02/22/16   Charm Rings, MD  hydrochlorothiazide (HYDRODIURIL) 25 MG tablet TAKE 1 TABLET(25 MG) BY MOUTH AT BEDTIME 12/30/15   Judyann Munson, MD  itraconazole (SPORANOX) 100 MG capsule Take 2 capsules (200 mg total) by mouth 2 (two) times daily. 03/29/16   Renne Crigler, PA-C  lisinopril (PRINIVIL,ZESTRIL) 20 MG tablet TAKE 1/2 TABLET BY MOUTH DAILY UNTIL NEXT APPOINTMENT OR 1 TABLET DAILY AS DIRECTED 11/04/15   Judyann Munson, MD  predniSONE (DELTASONE) 50 MG tablet Take 1 pill daily for 5 days. 02/22/16   Charm Rings, MD  TRIUMEQ 600-50-300 MG tablet TAKE 1 TABLET BY MOUTH DAILY 01/27/16   Judyann Munson, MD    Family History History reviewed. No pertinent family history.  Social History Social History  Substance Use Topics  . Smoking status: Current Every Day Smoker    Packs/day: 0.15    Types: Cigarettes  . Smokeless tobacco: Never Used     Comment: not ready to quit - cutting back  . Alcohol use No     Allergies   Patient has no known allergies.   Review of Systems Review of Systems  Constitutional: Negative for fever.  HENT: Negative for rhinorrhea and sore throat.   Eyes: Negative for redness.  Respiratory: Negative for cough.   Cardiovascular: Negative for chest pain.  Gastrointestinal: Negative for abdominal pain, diarrhea, nausea and vomiting.  Genitourinary: Negative for dysuria.  Musculoskeletal: Negative for myalgias.  Skin: Positive for color change  and wound. Negative for rash.  Neurological: Negative for headaches.     Physical Exam Updated Vital Signs BP (!) 163/106 (BP Location: Right Arm)   Pulse 82   Temp 98.2 F (36.8 C) (Oral)   Resp 16   Ht 5\' 8"  (1.727 m)   Wt 79.4 kg   SpO2 97%   BMI 26.61 kg/m   Physical Exam  Constitutional: He appears well-developed and well-nourished.  HENT:  Head: Normocephalic and atraumatic.  Eyes: Conjunctivae are normal.  Neck: Normal range of motion. Neck supple.  Pulmonary/Chest: No respiratory distress.  Neurological: He  is alert.  Skin: Skin is warm and dry. There is erythema.  Patient with tinea pedis bilaterally, mild drainage, cracking of the skin between the toes and on the plantar surface of the foot at the base of the toes. No significant secondary bacterial infection noted.  There is a small approximately 2 cm area of erythema on the anterior aspect of the right lower extremity. There is overlying cracked skin. No drainage. Area is slightly warm. Consistent with mild cellulitis.  Psychiatric: He has a normal mood and affect.  Nursing note and vitals reviewed.    ED Treatments / Results   Procedures Procedures (including critical care time)  Medications Ordered in ED Medications - No data to display   Initial Impression / Assessment and Plan / ED Course  I have reviewed the triage vital signs and the nursing notes.  Pertinent labs & imaging results that were available during my care of the patient were reviewed by me and considered in my medical decision making (see chart for details).  Clinical Course    Patient seen and examined. Previous records reviewed. Overall, his infections are mild. He was previously treated with Lotrimin topical and Keflex which was later changed to Doxy.  Vital signs reviewed and are as follows: BP (!) 163/106 (BP Location: Right Arm)   Pulse 82   Temp 98.2 F (36.8 C) (Oral)   Resp 16   Ht 5\' 8"  (1.727 m)   Wt 79.4 kg   SpO2 97%   BMI 26.61 kg/m   Patient given prescription for oral doxycycline and oral itraconazole area and encouraged PCP follow-up in the next 1 week to ensure improvement in symptoms. Patient will call his infectious disease doctor tomorrow for instructions.  Pt urged to return with worsening pain, worsening swelling, expanding area of redness or streaking up extremity, fever, or any other concerns.Urged to take complete course of antibiotics as prescribed. Pt verbalizes understanding and agrees with plan.    Final Clinical  Impressions(s) / ED Diagnoses   Final diagnoses:  Tinea pedis of both feet  Cellulitis of right lower extremity   Tinea pedis: Do not feel patient has secondary bacterial infection. Will treat with oral itraconazole. Patient to continue to keep area dry and change socks frequently especially while working.  Cellulitis: Probable early cellulitis, minimal. No systemic symptoms or streaking. No indications for further lab workup for IV antibiotics. Will treat with Doxy. Patient with normal CD4 count.  New Prescriptions New Prescriptions   DOXYCYCLINE (VIBRAMYCIN) 100 MG CAPSULE    Take 1 capsule (100 mg total) by mouth 2 (two) times daily.   ITRACONAZOLE (SPORANOX) 100 MG CAPSULE    Take 2 capsules (200 mg total) by mouth 2 (two) times daily.     Renne CriglerJoshua Tambra Muller, PA-C 03/29/16 1602    Vanetta MuldersScott Zackowski, MD 03/31/16 2337

## 2016-03-29 NOTE — Discharge Instructions (Signed)
Please read and follow all provided instructions.  Your diagnoses today include:  1. Tinea pedis of both feet   2. Cellulitis of right lower extremity     Tests performed today include:  Vital signs. See below for your results today.   Medications prescribed:   Doxycycline - antibiotic  You have been prescribed an antibiotic medicine: take the entire course of medicine even if you are feeling better. Stopping early can cause the antibiotic not to work.   Itraconazole - medication for athlete's foot  Take any prescribed medications only as directed.   Home care instructions:  Follow any educational materials contained in this packet. Keep affected area above the level of your heart when possible. Wash area gently twice a day with warm soapy water. Do not apply alcohol or hydrogen peroxide. Cover the area if it draining or weeping.   Follow-up instructions: Please follow-up with your primary care provider in the next 1 week for further evaluation of your symptoms.   Return instructions:  Return to the Emergency Department if you have:  Fever  Worsening symptoms  Worsening pain  Worsening swelling  Redness of the skin that moves away from the affected area, especially if it streaks away from the affected area   Any other emergent concerns  Your vital signs today were: BP (!) 163/106 (BP Location: Right Arm)    Pulse 82    Temp 98.2 F (36.8 C) (Oral)    Resp 16    Ht 5\' 8"  (1.727 m)    Wt 79.4 kg    SpO2 97%    BMI 26.61 kg/m  If your blood pressure (BP) was elevated above 135/85 this visit, please have this repeated by your doctor within one month. --------------

## 2016-03-29 NOTE — ED Notes (Signed)
Patient was alert, oriented and stable upon discharge. RN went over AVS and patient had no further questions. Pt refused wheelchair.  

## 2016-04-21 ENCOUNTER — Ambulatory Visit (INDEPENDENT_AMBULATORY_CARE_PROVIDER_SITE_OTHER): Payer: Self-pay | Admitting: Internal Medicine

## 2016-04-21 ENCOUNTER — Encounter: Payer: Self-pay | Admitting: Internal Medicine

## 2016-04-21 VITALS — BP 165/89 | HR 75 | Temp 97.8°F | Wt 173.0 lb

## 2016-04-21 DIAGNOSIS — Z9119 Patient's noncompliance with other medical treatment and regimen: Secondary | ICD-10-CM

## 2016-04-21 DIAGNOSIS — I10 Essential (primary) hypertension: Secondary | ICD-10-CM

## 2016-04-21 DIAGNOSIS — B353 Tinea pedis: Secondary | ICD-10-CM

## 2016-04-21 DIAGNOSIS — B20 Human immunodeficiency virus [HIV] disease: Secondary | ICD-10-CM

## 2016-04-21 DIAGNOSIS — Z91199 Patient's noncompliance with other medical treatment and regimen due to unspecified reason: Secondary | ICD-10-CM

## 2016-04-21 MED ORDER — TERBINAFINE HCL 250 MG PO TABS: 250.0000 mg | ORAL_TABLET | Freq: Every day | ORAL | 2 refills | Status: DC

## 2016-04-21 NOTE — Progress Notes (Signed)
HPI: Jamie Clark is a 66 y.o. male who is here for his HIV visit.   Allergies: No Known Allergies  Vitals: Temp: 97.8 F (36.6 C) (01/30 1054) Temp Source: Oral (01/30 1054) BP: 165/89 (01/30 1054) Pulse Rate: 75 (01/30 1054)  Past Medical History: Past Medical History:  Diagnosis Date  . Diabetes mellitus without complication (HCC)   . Hepatitis C, chronic (HCC)   . HIV positive (HCC)   . Hypertension     Social History: Social History   Social History  . Marital status: Divorced    Spouse name: N/A  . Number of children: N/A  . Years of education: N/A   Social History Main Topics  . Smoking status: Current Every Day Smoker    Packs/day: 0.15    Types: Cigarettes  . Smokeless tobacco: Never Used     Comment: not ready to quit - cutting back  . Alcohol use No  . Drug use: No  . Sexual activity: No     Comment: given condms   Other Topics Concern  . None   Social History Narrative  . None    Previous Regimen: TRV, DTG, ISN  Current Regimen: Triumeq  Labs: HIV 1 RNA Quant (copies/mL)  Date Value  12/12/2015 <20  06/11/2015 62 (H)  01/15/2015 <20   HIV-1 RNA Viral Load (no units)  Date Value  07/18/2013 <50  05/23/2013 <50  04/25/2013 <50   CD4 (no units)  Date Value  07/18/2013 927  05/23/2013 991  04/25/2013 837   CD4 T Cell Abs (/uL)  Date Value  12/12/2015 810  06/11/2015 850  11/21/2014 870   Hep B S Ab (no units)  Date Value  05/06/2010 NEG   Hepatitis B Surface Ag (no units)  Date Value  05/06/2010 NEGATIVE   HCV Ab (no units)  Date Value  01/01/2009 REACTIVE (A)    CrCl: CrCl cannot be calculated (Patient's most recent lab result is older than the maximum 21 days allowed.).  Lipids:    Component Value Date/Time   CHOL 215 (H) 06/11/2015 1135   CHOL 170 03/30/2013   TRIG 183 (H) 06/11/2015 1135   TRIG 148 03/30/2013   HDL 29 (L) 06/11/2015 1135   CHOLHDL 7.4 (H) 06/11/2015 1135   VLDL 37 (H) 06/11/2015  1135   LDLCALC 149 (H) 06/11/2015 1135   LDLCALC 93 10/12/2012    Assessment: Jamie Clark has been doing well recently with the suppression of his HIV. However, he has had some issue with the living situation and some depression. Therefore, he has stopped his meds for about 1 1/2 week. Reiterated with him about how dangerous that is and he should not doing that. Sometimes, he gets stressful and forget his meds. We decided to give him both the pillbox and the key chain pill box. He really likes the key chain one since he can fits a couple of Triumeq in there. For the times that he forgot to take it while at work, he'll have it with him. I'm going to bring Jamie Clark back in 2 wks to do a f/u with him. After our discussion, I think he will take his meds.   Recommendations:  Restart Triumeq F/u back in 2 wks  Jamie Clark, PharmD, BCPS, AAHIVP, CPP Clinical Infectious Disease Pharmacist [facility] 04/21/2016, 12:04 PM

## 2016-04-21 NOTE — Progress Notes (Signed)
RFV: follow up on HIV disease Patient ID: Jamie MessingWalter Clark, male   DOB: 02/20/1951, 66 y.o.   MRN: [id number]  HPI Jamie Clark is an 66yo M with longstanding HIV disease, CD 4 count of 810/VL<20 (sep 2017) on triumeq. He has had ongoing issues with onychomycosis and candidal foot infection. He has been using topical ointment, attempts to change socks often. He previously did a trial of terbinafine which helped me per his report. Of late, he has had financial stressors,now living in single occupancy/room in home with other males. He is slowly adjusting to his new living conditions, he has become apathetic to taking his hiv meds, all his meds, really. He reports not taking any meds for the past 2 weeks.   Outpatient Encounter Prescriptions as of 04/21/2016  Medication Sig  . doxycycline (VIBRAMYCIN) 100 MG capsule Take 1 capsule (100 mg total) by mouth 2 (two) times daily.  . fluticasone (FLONASE) 50 MCG/ACT nasal spray Place 1 spray into both nostrils daily.  . hydrochlorothiazide (HYDRODIURIL) 25 MG tablet TAKE 1 TABLET(25 MG) BY MOUTH AT BEDTIME  . itraconazole (SPORANOX) 100 MG capsule Take 2 capsules (200 mg total) by mouth 2 (two) times daily.  [name]. lisinopril (PRINIVIL,ZESTRIL) 20 MG tablet TAKE 1/2 TABLET BY MOUTH DAILY UNTIL NEXT APPOINTMENT OR 1 TABLET DAILY AS DIRECTED  . predniSONE (DELTASONE) 50 MG tablet Take 1 pill daily for 5 days.  . TRIUMEQ 600-50-300 MG tablet TAKE 1 TABLET BY MOUTH DAILY   No facility-administered encounter medications on file as of 04/21/2016.      Patient Active Problem List   Diagnosis Date Noted  . Acute pain of left foot 04/30/2014  . Hyperglycemia 04/30/2014  . Cellulitis and abscess of left foot 04/30/2014  . Acute renal failure (HCC) 04/30/2014  . Tinea pedis of left foot 10/23/2013  . Phlebitis 04/01/2011    Class: History of  . INSOMNIA 04/14/2010  . Dyslipidemia 01/17/2009  . HTN (hypertension) 01/17/2009  . HIV infection (HCC) 12/18/2008  .  Chronic viral hepatitis C (HCC) 07/13/2007     Health Maintenance Due  Topic Date Due  . TETANUS/TDAP  01/22/1971  . COLONOSCOPY  01/21/2002  . ZOSTAVAX  01/22/2012     Review of Systems  Physical Exam   BP (!) 165/89   Pulse 75   Temp 97.8 F (36.6 C) (Oral)   Wt 173 lb (78.5 kg)   BMI 26.30 kg/m   Physical Exam  Constitutional: He is oriented to person, place, and time. He appears well-developed and well-nourished. No distress.  HENT:  Mouth/Throat: Oropharynx is clear and moist. No oropharyngeal exudate.  Cardiovascular: Normal rate, regular rhythm and normal heart sounds. Exam reveals no gallop and no friction rub.  No murmur heard.  Pulmonary/Chest: Effort normal and breath sounds normal. No respiratory distress. He has no wheezes.  Abdominal: Soft. Bowel sounds are normal. He exhibits no distension. There is no tenderness.  Lymphadenopathy:  He has no cervical adenopathy.  Neurological: He is alert and oriented to person, place, and time.  Skin: his feet still have maceration with associated exfoliation of skin between toes, onychomycosis of toe nails Psychiatric: He has a normal mood and affect. His behavior is normal.    Lab Results  Component Value Date   CD4TCELL 41 12/12/2015   Lab Results  Component Value Date   CD4TABS 810 12/12/2015   CD4TABS 850 06/11/2015   CD4TABS 870 11/21/2014   Lab Results  Component Value Date   HIV1RNAQUANT <  20 12/12/2015   Lab Results  Component Value Date   HEPBSAB NEG 05/06/2010   Lab Results  Component Value Date   LABRPR NON REAC 12/12/2015    CBC Lab Results  Component Value Date   WBC 4.3 12/12/2015   RBC 4.60 12/12/2015   HGB 14.0 12/12/2015   HCT 41.6 12/12/2015   PLT 267 12/12/2015   MCV 90.4 12/12/2015   MCH 30.4 12/12/2015   MCHC 33.7 12/12/2015   RDW 13.9 12/12/2015   LYMPHSABS 2,021 12/12/2015   MONOABS 301 12/12/2015   EOSABS 172 12/12/2015    BMET Lab Results  Component Value Date    NA 141 12/12/2015   K 3.6 12/12/2015   CL 110 12/12/2015   CO2 21 12/12/2015   GLUCOSE 129 (H) 12/12/2015   BUN 17 12/12/2015   CREATININE 1.10 12/12/2015   CALCIUM 8.9 12/12/2015   GFRNONAA 71 12/12/2015   GFRAA 82 12/12/2015      Assessment and Plan  hiv disease =now poorly controlled since he stopped taking his meds. Recent stressor is that he had to move into a place with other folks, now renting a room. Does not endorse deppresion?. He is will to restart and meet with adherence counselors.   I suspect situational depression contributing to med non-adherence. See if counseling will help  Fungal foot infection = will do another trial terbanifine again. Have him referred see podiatry to see if any additional things can be done to help his skin condition  Hypertension = poorly controlled since off of meds. He is willing to restart  Spent 25 min with greater than 50% in adherence counseling  In addition he is meeting with pharmacy for further intensive Adherence counseling

## 2016-04-30 ENCOUNTER — Ambulatory Visit: Payer: Self-pay

## 2016-05-05 ENCOUNTER — Ambulatory Visit (INDEPENDENT_AMBULATORY_CARE_PROVIDER_SITE_OTHER): Payer: Self-pay | Admitting: Pharmacist Clinician (PhC)/ Clinical Pharmacy Specialist

## 2016-05-05 DIAGNOSIS — B20 Human immunodeficiency virus [HIV] disease: Secondary | ICD-10-CM

## 2016-05-05 MED ORDER — ABACAVIR-DOLUTEGRAVIR-LAMIVUD 600-50-300 MG PO TABS: 1.0000 | ORAL_TABLET | Freq: Every day | ORAL | 5 refills | Status: DC

## 2016-05-05 NOTE — Patient Instructions (Addendum)
Continue to take your meds Come back and see Dr. [name] on 3/7 at [facility]4PM

## 2016-05-05 NOTE — Progress Notes (Signed)
HPI: Jamie Clark is a [age] y.o. male who walked into today even though his appt is tomorrow.   Allergies: No Known Allergies  Vitals:    Past Medical History: Past Medical History:  Diagnosis Date  . Diabetes mellitus without complication (HCC)   . Hepatitis C, chronic (HCC)   . HIV positive (HCC)   . Hypertension     Social History: Social History   Social History  . Marital status: Divorced    Spouse name: N/A  . Number of children: N/A  . Years of education: N/A   Social History Main Topics  . Smoking status: Current Every Day Smoker    Packs/day: 0.15    Types: Cigarettes  . Smokeless tobacco: Never Used     Comment: not ready to quit - cutting back  . Alcohol use No  . Drug use: No  . Sexual activity: No     Comment: given condms   Other Topics Concern  . Not on file   Social History Narrative  . No narrative on file    Previous Regimen:  TRV, DTG, ISN  Current Regimen: Triumeq  Labs: HIV 1 RNA Quant (copies/mL)  Date Value  12/12/2015 <20  06/11/2015 62 (H)  01/15/2015 <20   HIV-1 RNA Viral Load (no units)  Date Value  07/18/2013 <50  05/23/2013 <50  04/25/2013 <50   CD4 (no units)  Date Value  07/18/2013 927  05/23/2013 991  04/25/2013 837   CD4 T Cell Abs (/uL)  Date Value  12/12/2015 810  06/11/2015 850  11/21/2014 870   Hep B S Ab (no units)  Date Value  05/06/2010 NEG   Hepatitis B Surface Ag (no units)  Date Value  05/06/2010 NEGATIVE   HCV Ab (no units)  Date Value  01/01/2009 REACTIVE (A)    CrCl: CrCl cannot be calculated (Patient's most recent lab result is older than the maximum 21 days allowed.).  Lipids:    Component Value Date/Time   CHOL 215 (H) 06/11/2015 1135   CHOL 170 03/30/2013   TRIG 183 (H) 06/11/2015 1135   TRIG 148 03/30/2013   HDL 29 (L) 06/11/2015 1135   CHOLHDL 7.4 (H) 06/11/2015 1135   VLDL 37 (H) 06/11/2015 1135   LDLCALC 149 (H) 06/11/2015 1135   LDLCALC 93 10/12/2012     Assessment: [name] thought that his appt is today so here asked if we could see him today instead. He had a lapse in his compliance a couple of weeks ago. Since then he has not missed any doses. Brought him back to check his adherence. He is doing well. He has insurance now instead of ADAP. Unfortunately, it's [employer] so it has to be filled at a specialty pharmacy. We will send it to [facility] in [place] so they can activate the co-pay card for him. He is scheduled to see Dr. [name] mid march so it'll be a perfect time to repeat his VL.   Recommendations:  Tx Triumeq to [facility] F/u with Dr [name] mid March  Minh Pham, PharmD, BCPS, AAHIVP, CPP Clinical Infectious Disease Pharmacist [facility] 05/05/2016, 4:11 PM

## 2016-05-06 ENCOUNTER — Ambulatory Visit: Payer: Self-pay

## 2016-05-12 ENCOUNTER — Encounter: Payer: Self-pay | Admitting: Internal Medicine

## 2016-05-27 ENCOUNTER — Ambulatory Visit (INDEPENDENT_AMBULATORY_CARE_PROVIDER_SITE_OTHER): Payer: Self-pay | Admitting: Internal Medicine

## 2016-05-27 ENCOUNTER — Encounter: Payer: Self-pay | Admitting: Internal Medicine

## 2016-05-27 VITALS — BP 128/85 | HR 74 | Temp 97.6°F | Ht 67.0 in | Wt 174.0 lb

## 2016-05-27 DIAGNOSIS — I10 Essential (primary) hypertension: Secondary | ICD-10-CM

## 2016-05-27 DIAGNOSIS — B20 Human immunodeficiency virus [HIV] disease: Secondary | ICD-10-CM

## 2016-05-27 DIAGNOSIS — N182 Chronic kidney disease, stage 2 (mild): Secondary | ICD-10-CM

## 2016-05-27 DIAGNOSIS — B353 Tinea pedis: Secondary | ICD-10-CM

## 2016-05-27 LAB — CBC WITH DIFFERENTIAL/PLATELET
BASOS PCT: 1 %
Basophils Absolute: 42 cells/uL (ref 0–200)
EOS PCT: 4 %
Eosinophils Absolute: 168 cells/uL (ref 15–500)
HEMATOCRIT: 42 % (ref 38.5–50.0)
HEMOGLOBIN: 14.2 g/dL (ref 13.2–17.1)
LYMPHS ABS: 2142 {cells}/uL (ref 850–3900)
Lymphocytes Relative: 51 %
MCH: 31.3 pg (ref 27.0–33.0)
MCHC: 33.8 g/dL (ref 32.0–36.0)
MCV: 92.7 fL (ref 80.0–100.0)
MONO ABS: 336 {cells}/uL (ref 200–950)
MPV: 9.3 fL (ref 7.5–12.5)
Monocytes Relative: 8 %
NEUTROS ABS: 1512 {cells}/uL (ref 1500–7800)
Neutrophils Relative %: 36 %
Platelets: 287 10*3/uL (ref 140–400)
RBC: 4.53 MIL/uL (ref 4.20–5.80)
RDW: 14.9 % (ref 11.0–15.0)
WBC: 4.2 10*3/uL (ref 3.8–10.8)

## 2016-05-27 MED ORDER — TERBINAFINE HCL 250 MG PO TABS: 250.0000 mg | ORAL_TABLET | Freq: Every day | ORAL | 2 refills | Status: DC

## 2016-05-27 NOTE — Progress Notes (Signed)
RFV: hiv disease follow up visit  Patient ID: Jamie Clark, male   DOB: 07/11/1950, 66 y.o.   MRN: [id number]  HPI Jamie Clark is a 66yo M with well controlled HIV disease, CD 4 count of 810/VL<20 (fall 2017). Currently on triumeq. He was seen roughly 4 weeks ago stating that he had stopped taking his HIV medications and the rest of his meds, "given up". Since our last visit, and also having adherence counseling with pharmacy, he reports taking his medications consistently for the past 4 weeks.  He states that he still has rawness/fungal infection of feet. He works in [employer], has notices cracks and fissure of skin  Outpatient Encounter Prescriptions as of 05/27/2016  Medication Sig  . abacavir-dolutegravir-lamiVUDine (TRIUMEQ) 600-50-300 MG tablet Take 1 tablet by mouth daily.  . hydrochlorothiazide (HYDRODIURIL) 25 MG tablet TAKE 1 TABLET(25 MG) BY MOUTH AT BEDTIME  . lisinopril (PRINIVIL,ZESTRIL) 20 MG tablet TAKE 1/2 TABLET BY MOUTH DAILY UNTIL NEXT APPOINTMENT OR 1 TABLET DAILY AS DIRECTED  . doxycycline (VIBRAMYCIN) 100 MG capsule Take 1 capsule (100 mg total) by mouth 2 (two) times daily. (Patient not taking: Reported on 05/05/2016)  . fluticasone (FLONASE) 50 MCG/ACT nasal spray Place 1 spray into both nostrils daily. (Patient not taking: Reported on 05/05/2016)  . itraconazole (SPORANOX) 100 MG capsule Take 2 capsules (200 mg total) by mouth 2 (two) times daily. (Patient not taking: Reported on 05/05/2016)  . predniSONE (DELTASONE) 50 MG tablet Take 1 pill daily for 5 days. (Patient not taking: Reported on 05/05/2016)  . terbinafine (LAMISIL) 250 MG tablet Take 1 tablet (250 mg total) by mouth daily. (Patient not taking: Reported on 05/05/2016)   No facility-administered encounter medications on file as of 05/27/2016.      Patient Active Problem List   Diagnosis Date Noted  . Acute pain of left foot 04/30/2014  . Hyperglycemia 04/30/2014  . Cellulitis and abscess of left foot  04/30/2014  . Acute renal failure (HCC) 04/30/2014  . Tinea pedis of left foot 10/23/2013  . Phlebitis 04/01/2011    Class: History of  . INSOMNIA 04/14/2010  . Dyslipidemia 01/17/2009  . HTN (hypertension) 01/17/2009  . HIV infection (HCC) 12/18/2008  . Chronic viral hepatitis C (HCC) 07/13/2007     Health Maintenance Due  Topic Date Due  . TETANUS/TDAP  01/22/1971  . COLONOSCOPY  01/21/2002     Review of Systems +feet skin infection. 10 point ros is otherwise negative Physical Exam   BP 128/85   Pulse 74   Temp 97.6 F (36.4 C) (Oral)   Ht 5\' 7"  (1.702 m)   Wt 174 lb (78.9 kg)   BMI 27.25 kg/m   Physical Exam  Constitutional: He is oriented to person, place, and time. He appears well-developed and well-nourished. No distress.  HENT:  Mouth/Throat: Oropharynx is clear and moist. No oropharyngeal exudate.  Cardiovascular: Normal rate, regular rhythm and normal heart sounds. Exam reveals no gallop and no friction rub.  No murmur heard.  Pulmonary/Chest: Effort normal and breath sounds normal. No respiratory distress. He has no wheezes.  Abdominal: Soft. Bowel sounds are normal. He exhibits no distension. There is no tenderness.  Lymphadenopathy:  He has no cervical adenopathy.  Neurological: He is alert and oriented to person, place, and time.  Skin: Skin of feet show maceration between toes. Some fissures noted on plantar aspect of base of toes. Thickened nail beds Psychiatric: He has a normal mood and affect. His behavior is normal.  Lab Results  Component Value Date   CD4TCELL 41 12/12/2015   Lab Results  Component Value Date   CD4TABS 810 12/12/2015   CD4TABS 850 06/11/2015   CD4TABS 870 11/21/2014   Lab Results  Component Value Date   HIV1RNAQUANT <20 12/12/2015   Lab Results  Component Value Date   HEPBSAB NEG 05/06/2010   Lab Results  Component Value Date   LABRPR NON REAC 12/12/2015    CBC Lab Results  Component Value Date   WBC 4.3  12/12/2015   RBC 4.60 12/12/2015   HGB 14.0 12/12/2015   HCT 41.6 12/12/2015   PLT 267 12/12/2015   MCV 90.4 12/12/2015   MCH 30.4 12/12/2015   MCHC 33.7 12/12/2015   RDW 13.9 12/12/2015   LYMPHSABS 2,021 12/12/2015   MONOABS 301 12/12/2015   EOSABS 172 12/12/2015    BMET Lab Results  Component Value Date   NA 141 12/12/2015   K 3.6 12/12/2015   CL 110 12/12/2015   CO2 21 12/12/2015   GLUCOSE 129 (H) 12/12/2015   BUN 17 12/12/2015   CREATININE 1.10 12/12/2015   CALCIUM 8.9 12/12/2015   GFRNONAA 71 12/12/2015   GFRAA 82 12/12/2015      Assessment and Plan   hiv disease= has been compliant since last visit. Will check labs today  Onychomycosis/tinea pedis = will do a trial of terbinafine. Recommended that he keeps his feet dry since they appear macerated from moisture  htn = well controlled on 2 agents. Plan to continue to refill meds  ckd2 = stable

## 2016-05-28 LAB — COMPLETE METABOLIC PANEL WITH GFR
ALBUMIN: 3.8 g/dL (ref 3.6–5.1)
ALK PHOS: 92 U/L (ref 40–115)
ALT: 14 U/L (ref 9–46)
AST: 21 U/L (ref 10–35)
BUN: 15 mg/dL (ref 7–25)
CO2: 23 mmol/L (ref 20–31)
CREATININE: 1.21 mg/dL (ref 0.70–1.25)
Calcium: 9.3 mg/dL (ref 8.6–10.3)
Chloride: 106 mmol/L (ref 98–110)
GFR, Est African American: 73 mL/min (ref >=60)
GFR, Est Non African American: 63 mL/min (ref >=60)
GLUCOSE: 90 mg/dL (ref 65–99)
POTASSIUM: 3.6 mmol/L (ref 3.5–5.3)
SODIUM: 139 mmol/L (ref 135–146)
TOTAL PROTEIN: 7.9 g/dL (ref 6.1–8.1)
Total Bilirubin: 0.4 mg/dL (ref 0.2–1.2)

## 2016-05-29 LAB — T-HELPER CELL (CD4) - (RCID CLINIC ONLY)
CD4 % Helper T Cell: 46 % (ref 33–55)
CD4 T Cell Abs: 1080 /uL (ref 400–2700)

## 2016-05-29 LAB — HIV-1 RNA QUANT-NO REFLEX-BLD
HIV 1 RNA Quant: 37 copies/mL — ABNORMAL HIGH
HIV-1 RNA Quant, Log: 1.57 Log copies/mL — ABNORMAL HIGH

## 2016-06-27 ENCOUNTER — Encounter (HOSPITAL_COMMUNITY): Payer: Self-pay | Admitting: Emergency Medicine

## 2016-06-27 ENCOUNTER — Emergency Department (HOSPITAL_COMMUNITY): Payer: BLUE CROSS/BLUE SHIELD

## 2016-06-27 ENCOUNTER — Emergency Department (HOSPITAL_COMMUNITY)
Admission: EM | Admit: 2016-06-27 | Discharge: 2016-06-27 | Disposition: A | Discharge status: Home / Self Care | Payer: BLUE CROSS/BLUE SHIELD | Attending: Emergency Medicine | Admitting: Emergency Medicine

## 2016-06-27 ENCOUNTER — Emergency Department (HOSPITAL_BASED_OUTPATIENT_CLINIC_OR_DEPARTMENT_OTHER)
Admit: 2016-06-27 | Discharge: 2016-06-27 | Disposition: A | Discharge status: Home / Self Care | Payer: BLUE CROSS/BLUE SHIELD | Attending: Emergency Medicine | Admitting: Emergency Medicine

## 2016-06-27 DIAGNOSIS — I891 Lymphangitis: Secondary | ICD-10-CM | POA: Insufficient documentation

## 2016-06-27 DIAGNOSIS — Z79899 Other long term (current) drug therapy: Secondary | ICD-10-CM | POA: Insufficient documentation

## 2016-06-27 DIAGNOSIS — I10 Essential (primary) hypertension: Secondary | ICD-10-CM | POA: Diagnosis not present

## 2016-06-27 DIAGNOSIS — E119 Type 2 diabetes mellitus without complications: Secondary | ICD-10-CM | POA: Diagnosis not present

## 2016-06-27 DIAGNOSIS — L0390 Cellulitis, unspecified: Secondary | ICD-10-CM | POA: Diagnosis not present

## 2016-06-27 DIAGNOSIS — M79609 Pain in unspecified limb: Secondary | ICD-10-CM

## 2016-06-27 DIAGNOSIS — L03115 Cellulitis of right lower limb: Secondary | ICD-10-CM

## 2016-06-27 DIAGNOSIS — R1031 Right lower quadrant pain: Secondary | ICD-10-CM | POA: Diagnosis present

## 2016-06-27 DIAGNOSIS — M7989 Other specified soft tissue disorders: Secondary | ICD-10-CM

## 2016-06-27 DIAGNOSIS — F17210 Nicotine dependence, cigarettes, uncomplicated: Secondary | ICD-10-CM | POA: Diagnosis not present

## 2016-06-27 DIAGNOSIS — A55 Chlamydial lymphogranuloma (venereum): Secondary | ICD-10-CM | POA: Insufficient documentation

## 2016-06-27 DIAGNOSIS — L0391 Acute lymphangitis, unspecified: Secondary | ICD-10-CM

## 2016-06-27 LAB — COMPREHENSIVE METABOLIC PANEL
ALT: 17 U/L (ref 17–63)
AST: 32 U/L (ref 15–41)
Albumin: 3.7 g/dL (ref 3.5–5.0)
Alkaline Phosphatase: 88 U/L (ref 38–126)
Anion gap: 8 (ref 5–15)
BUN: 15 mg/dL (ref 6–20)
CO2: 23 mmol/L (ref 22–32)
Calcium: 9 mg/dL (ref 8.9–10.3)
Chloride: 105 mmol/L (ref 101–111)
Creatinine, Ser: 1.25 mg/dL — ABNORMAL HIGH (ref 0.61–1.24)
GFR, EST NON AFRICAN AMERICAN: 59 mL/min — AB (ref >60)
Glucose, Bld: 126 mg/dL — ABNORMAL HIGH (ref 65–99)
POTASSIUM: 3.4 mmol/L — AB (ref 3.5–5.1)
Sodium: 136 mmol/L (ref 135–145)
TOTAL PROTEIN: 8.1 g/dL (ref 6.5–8.1)
Total Bilirubin: 0.8 mg/dL (ref 0.3–1.2)

## 2016-06-27 LAB — CBC WITH DIFFERENTIAL/PLATELET
BASOS ABS: 0 10*3/uL (ref 0.0–0.1)
Basophils Relative: 0 %
EOS ABS: 0.1 10*3/uL (ref 0.0–0.7)
EOS PCT: 1 %
HCT: 41.8 % (ref 39.0–52.0)
Hemoglobin: 14.5 g/dL (ref 13.0–17.0)
LYMPHS PCT: 8 %
Lymphs Abs: 0.8 10*3/uL (ref 0.7–4.0)
MCH: 32.2 pg (ref 26.0–34.0)
MCHC: 34.7 g/dL (ref 30.0–36.0)
MCV: 92.7 fL (ref 78.0–100.0)
Monocytes Absolute: 0.8 10*3/uL (ref 0.1–1.0)
Monocytes Relative: 8 %
Neutro Abs: 8.6 10*3/uL — ABNORMAL HIGH (ref 1.7–7.7)
Neutrophils Relative %: 83 %
PLATELETS: 268 10*3/uL (ref 150–400)
RBC: 4.51 MIL/uL (ref 4.22–5.81)
RDW: 12.9 % (ref 11.5–15.5)
WBC: 10.3 10*3/uL (ref 4.0–10.5)

## 2016-06-27 LAB — URINALYSIS, ROUTINE W REFLEX MICROSCOPIC
BILIRUBIN URINE: NEGATIVE
GLUCOSE, UA: NEGATIVE mg/dL
HGB URINE DIPSTICK: NEGATIVE
Ketones, ur: NEGATIVE mg/dL
LEUKOCYTES UA: NEGATIVE
Nitrite: NEGATIVE
Protein, ur: NEGATIVE mg/dL
SPECIFIC GRAVITY, URINE: 1.016 (ref 1.005–1.030)
pH: 5 (ref 5.0–8.0)

## 2016-06-27 LAB — I-STAT CG4 LACTIC ACID, ED
LACTIC ACID, VENOUS: 3.3 mmol/L — AB (ref 0.5–1.9)
Lactic Acid, Venous: 1.22 mmol/L (ref 0.5–1.9)

## 2016-06-27 LAB — C-REACTIVE PROTEIN: CRP: <0.8 mg/dL (ref <1.0)

## 2016-06-27 MED ORDER — IOPAMIDOL (ISOVUE-370) INJECTION 76%
INTRAVENOUS | Status: AC
  Filled 2016-06-27: qty 100

## 2016-06-27 MED ORDER — PIPERACILLIN-TAZOBACTAM 3.375 G IVPB 30 MIN
3.3750 g | Freq: Once | INTRAVENOUS | Status: AC
  Administered 2016-06-27: 3.375 g via INTRAVENOUS
  Filled 2016-06-27: qty 50

## 2016-06-27 MED ORDER — SODIUM CHLORIDE 0.9 % IV BOLUS (SEPSIS)
1000.0000 mL | Freq: Once | INTRAVENOUS | Status: AC
  Administered 2016-06-27: 1000 mL via INTRAVENOUS

## 2016-06-27 MED ORDER — SODIUM CHLORIDE 0.9 % IV BOLUS (SEPSIS)
500.0000 mL | Freq: Once | INTRAVENOUS | Status: AC
  Administered 2016-06-27: 500 mL via INTRAVENOUS

## 2016-06-27 MED ORDER — DOXYCYCLINE HYCLATE 100 MG PO CAPS: 100.0000 mg | ORAL_CAPSULE | Freq: Two times a day (BID) | ORAL | 0 refills | Status: AC

## 2016-06-27 MED ORDER — ACETAMINOPHEN 325 MG PO TABS
650.0000 mg | ORAL_TABLET | Freq: Once | ORAL | Status: AC
  Administered 2016-06-27: 650 mg via ORAL
  Filled 2016-06-27: qty 2

## 2016-06-27 MED ORDER — CEFTRIAXONE SODIUM 250 MG IJ SOLR
250.0000 mg | Freq: Once | INTRAMUSCULAR | Status: AC
  Administered 2016-06-27: 250 mg via INTRAMUSCULAR
  Filled 2016-06-27: qty 250

## 2016-06-27 MED ORDER — CEPHALEXIN 500 MG PO CAPS: 500.0000 mg | ORAL_CAPSULE | Freq: Three times a day (TID) | ORAL | 0 refills | Status: AC

## 2016-06-27 MED ORDER — FENTANYL CITRATE (PF) 100 MCG/2ML IJ SOLN
50.0000 ug | INTRAMUSCULAR | Status: DC | PRN
  Administered 2016-06-27: 50 ug via INTRAVENOUS
  Filled 2016-06-27: qty 2

## 2016-06-27 MED ORDER — IOPAMIDOL (ISOVUE-370) INJECTION 76%
100.0000 mL | Freq: Once | INTRAVENOUS | Status: AC | PRN
  Administered 2016-06-27: 100 mL via INTRAVENOUS

## 2016-06-27 MED ORDER — IBUPROFEN 800 MG PO TABS
800.0000 mg | ORAL_TABLET | Freq: Once | ORAL | Status: AC
  Administered 2016-06-27: 800 mg via ORAL
  Filled 2016-06-27: qty 1

## 2016-06-27 MED ORDER — VANCOMYCIN HCL IN DEXTROSE 1-5 GM/200ML-% IV SOLN
1000.0000 mg | Freq: Once | INTRAVENOUS | Status: AC
  Administered 2016-06-27: 1000 mg via INTRAVENOUS
  Filled 2016-06-27: qty 200

## 2016-06-27 MED ORDER — VANCOMYCIN HCL 10 G IV SOLR
1250.0000 mg | INTRAVENOUS | Status: DC
  Filled 2016-06-27: qty 1250

## 2016-06-27 MED ORDER — PIPERACILLIN-TAZOBACTAM 3.375 G IVPB: 3.3750 g | Freq: Three times a day (TID) | INTRAVENOUS | Status: DC

## 2016-06-27 MED ORDER — AZITHROMYCIN 250 MG PO TABS
1000.0000 mg | ORAL_TABLET | Freq: Once | ORAL | Status: AC
  Administered 2016-06-27: 1000 mg via ORAL
  Filled 2016-06-27: qty 4

## 2016-06-27 MED ORDER — HYDROCODONE-ACETAMINOPHEN 5-325 MG PO TABS: 1.0000 | ORAL_TABLET | ORAL | 0 refills | Status: DC | PRN

## 2016-06-27 MED ORDER — ACETAMINOPHEN 500 MG PO TABS
1000.0000 mg | ORAL_TABLET | Freq: Once | ORAL | Status: DC
  Filled 2016-06-27: qty 2

## 2016-06-27 MED ORDER — DOXYCYCLINE HYCLATE 100 MG PO TABS
100.0000 mg | ORAL_TABLET | Freq: Once | ORAL | Status: AC
  Administered 2016-06-27: 100 mg via ORAL
  Filled 2016-06-27: qty 1

## 2016-06-27 MED ORDER — DOXYCYCLINE HYCLATE 100 MG IV SOLR
100.0000 mg | Freq: Once | INTRAVENOUS | Status: DC
  Filled 2016-06-27: qty 100

## 2016-06-27 MED ORDER — ACETAMINOPHEN 500 MG PO TABS
1000.0000 mg | ORAL_TABLET | Freq: Once | ORAL | Status: AC
  Administered 2016-06-27: 1000 mg via ORAL
  Filled 2016-06-27: qty 2

## 2016-06-27 NOTE — ED Notes (Signed)
Had secretary call us to find out an ETA.  States they will be here as soon as possible.

## 2016-06-27 NOTE — ED Notes (Signed)
EKG obtained and given to Dr Clydene Pugh

## 2016-06-27 NOTE — Progress Notes (Addendum)
Pharmacy Antibiotic Note  Jamie Clark is a 66 y.o. male admitted on 06/27/2016 with what appears to be recurrent cellulitis to RLE. Reports chills, shakes, n/v, groin pain, and general malaise.  Pharmacy has been consulted for vancomycin and Zosyn dosing.  Plan:  Vancomycin 1000 mg IV now, then 1250 mg IV q24 hr; goal trough 15-20 mcg/mL  Measure vancomycin trough levels at steady state as indicated  Zosyn 3.375 g IV given once over 30 minutes, then every 8 hrs by 4-hr infusion  Daily SCr while on Vanc/Zosyn combo   Weight: 174 lb (78.9 kg)  Temp (24hrs), Avg:102 F (38.9 C), Min:102 F (38.9 C), Max:102 F (38.9 C)  No results for input(s): WBC, CREATININE, LATICACIDVEN, VANCOTROUGH, VANCOPEAK, VANCORANDOM, GENTTROUGH, GENTPEAK, GENTRANDOM, TOBRATROUGH, TOBRAPEAK, TOBRARND, AMIKACINPEAK, AMIKACINTROU, AMIKACIN in the last 168 hours.  CrCl cannot be calculated (Patient's most recent lab result is older than the maximum 21 days allowed.).    No Known Allergies  Antimicrobials this admission: Vancomycin 4/7 >>  Zosyn 4/7 >>   Dose adjustments this admission: ---  Microbiology results: 4/7 BCx: sent   Thank you for allowing pharmacy to be a part of this patient's care.  Bernadene Person, PharmD, BCPS Pager: [phone] 06/27/2016, 11:19 AM

## 2016-06-27 NOTE — Progress Notes (Signed)
VASCULAR LAB PRELIMINARY  PRELIMINARY  PRELIMINARY  PRELIMINARY  Right lower extremity venous duplex completed.    Preliminary report:  There is no DVT or SVT noted in the right lower extremity.  There are multiple enlarged inguinal lymph nodes noted in the right groin.  Salome Cozby, RVT 06/27/2016, 5:38 PM

## 2016-06-27 NOTE — ED Provider Notes (Signed)
Patient feels well except for mild pain at his anterior left thigh. He feels ready to go home. Tylenol ordered for leg pain. He'll get potassium chloride : By mouth prior to discharge. He is alert and ambulatory on discharge. Feels ready to go home. Not lightheaded on standing. Venous Doppler study negative for DVT   Doug Sou, MD 06/27/16 1610

## 2016-06-27 NOTE — ED Provider Notes (Signed)
[facility]-EMERGENCY DEPT Provider Note   CSN: [id number] Arrival date & time: 06/27/16  1048     History   Chief Complaint Chief Complaint  Patient presents with  . Multiple Complaints    HPI Jamie Clark is a 66 y.o. male.  The history is provided by the patient.  Extremity Pain  This is a new problem. The current episode started 2 days ago. The problem occurs constantly. The problem has been gradually worsening. Associated symptoms comments: Bilateral groin pain R>L. The symptoms are aggravated by walking. Nothing relieves the symptoms. He has tried nothing for the symptoms.    Past Medical History:  Diagnosis Date  . Diabetes mellitus without complication (HCC)   . Hepatitis C, chronic (HCC)   . HIV positive (HCC)   . Hypertension     Patient Active Problem List   Diagnosis Date Noted  . Acute pain of left foot 04/30/2014  . Hyperglycemia 04/30/2014  . Cellulitis and abscess of left foot 04/30/2014  . Acute renal failure (HCC) 04/30/2014  . Tinea pedis of left foot 10/23/2013  . Phlebitis 04/01/2011    Class: History of  . INSOMNIA 04/14/2010  . Dyslipidemia 01/17/2009  . HTN (hypertension) 01/17/2009  . HIV infection (HCC) 12/18/2008  . Chronic viral hepatitis C (HCC) 07/13/2007    History reviewed. No pertinent surgical history.     Home Medications    Prior to Admission medications   Medication Sig Start Date End Date Taking? Authorizing Provider  abacavir-dolutegravir-lamiVUDine (TRIUMEQ) 600-50-300 MG tablet Take 1 tablet by mouth daily. 05/05/16  Yes Judyann Munson, MD  hydrochlorothiazide (HYDRODIURIL) 25 MG tablet TAKE 1 TABLET(25 MG) BY MOUTH AT BEDTIME 12/30/15  Yes Judyann Munson, MD  lisinopril (PRINIVIL,ZESTRIL) 20 MG tablet TAKE 1/2 TABLET BY MOUTH DAILY UNTIL NEXT APPOINTMENT OR 1 TABLET DAILY AS DIRECTED 11/04/15  Yes Judyann Munson, MD  terbinafine (LAMISIL) 250 MG tablet Take 1 tablet (250 mg total) by mouth daily. 05/27/16  Yes Judyann Munson, MD    Family History No family history on file.  Social History Social History  Substance Use Topics  . Smoking status: Light Tobacco Smoker    Packs/day: 0.15    Types: Cigarettes  . Smokeless tobacco: Never Used     Comment: 1 a day  . Alcohol use No     Allergies   Patient has no known allergies.   Review of Systems Review of Systems  All other systems reviewed and are negative.    Physical Exam Updated Vital Signs BP (!) 151/76   Pulse 99   Temp (!) 101.6 F (38.7 C) (Oral)   Resp 14   Wt 174 lb (78.9 kg)   SpO2 95%   BMI 27.25 kg/m   Physical Exam  Constitutional: He is oriented to person, place, and time. He appears well-developed and well-nourished. No distress.  HENT:  Head: Normocephalic and atraumatic.  Nose: Nose normal.  Eyes: Conjunctivae are normal.  Neck: Neck supple. No tracheal deviation present.  Cardiovascular: Normal rate and regular rhythm.   Pulmonary/Chest: Effort normal. No respiratory distress.  Abdominal: Soft. He exhibits no distension.  Genitourinary: Penis normal.  Lymphadenopathy:       Right: Inguinal (with exquisite tenderness) adenopathy present.       Left: Inguinal adenopathy present.  Neurological: He is alert and oriented to person, place, and time.  Skin: Skin is warm and dry. Rash (3 cm area of circular mild erythema over right shin) noted.  Psychiatric: He has  a normal mood and affect.     ED Treatments / Results  Labs (all labs ordered are listed, but only abnormal results are displayed) Labs Reviewed  COMPREHENSIVE METABOLIC PANEL - Abnormal; Notable for the following:       Result Value   Potassium 3.4 (*)    Glucose, Bld 126 (*)    Creatinine, Ser 1.25 (*)    GFR calc non Af Amer 59 (*)    All other components within normal limits  CBC WITH DIFFERENTIAL/PLATELET - Abnormal; Notable for the following:    Neutro Abs 8.6 (*)    All other components within normal limits  I-STAT CG4 LACTIC ACID,  ED - Abnormal; Notable for the following:    Lactic Acid, Venous 3.30 (*)    All other components within normal limits  CULTURE, BLOOD (ROUTINE X 2)  CULTURE, BLOOD (ROUTINE X 2)  URINALYSIS, ROUTINE W REFLEX MICROSCOPIC  C-REACTIVE PROTEIN  SEDIMENTATION RATE  RPR  I-STAT CG4 LACTIC ACID, ED  GC/CHLAMYDIA PROBE AMP ([place]) NOT AT [facility]    EKG  EKG Interpretation  Date/Time:  Saturday June 27 2016 12:02:22 EDT Ventricular Rate:  96 PR Interval:    QRS Duration: 95 QT Interval:  344 QTC Calculation: 435 R Axis:   4 Text Interpretation:  Sinus rhythm Probable anteroseptal infarct, old Baseline wander in lead(s) V6 No significant change since last tracing Confirmed by Nuriyah Hanline MD, Verne Lanuza [phone]) on 06/27/2016 12:21:12 PM       Radiology Ct Angio Ao+bifem W & Or Wo Contrast  Result Date: 06/27/2016 CLINICAL DATA:  66 year old male with a history of right leg pain. Concern for vascular abnormality. EXAM: CT ANGIOGRAPHY OF ABDOMINAL AORTA WITH ILIOFEMORAL RUNOFF TECHNIQUE: Multidetector CT imaging of the abdomen, pelvis and lower extremities was performed using the standard protocol during bolus administration of intravenous contrast. Multiplanar CT image reconstructions and MIPs were obtained to evaluate the vascular anatomy. CONTRAST:  100 cc Isovue 3 70 COMPARISON:  CT 11/10/2011 FINDINGS: VASCULAR Aorta: Unremarkable course, caliber, contour of the abdominal aorta. No dissection, aneurysm, or periaortic fluid. Celiac: No significant atherosclerotic changes at the origin of the celiac artery. Typical branch pattern of the celiac artery, with left gastric, common hepatic, and splenic artery identified. Accessory left hepatic artery. SMA: No significant atherosclerotic changes of the superior mesenteric artery. Renals: Renal arteries are patent. Single left and single right renal artery. IMA: Inferior mesenteric artery is patent. Left colic artery patent. Superior rectal artery patent.  Right lower extremity: Mild atherosclerotic changes of the right iliac system. No significant stenosis or occlusion. No aneurysm or dissection. Hypogastric artery remains patent. External iliac artery widely patent. Common femoral artery patent. Mild atherosclerotic changes of the proximal right superficial femoral artery without significant narrowing. Profunda femoris is patent. Popliteal artery patent. Trifurcation remains patent, with apparent in-line flow of 3 tibial vessels to the ankle. Left lower extremity: Mild atherosclerotic changes of the left iliac system. No significant stenosis or occlusion. No aneurysm or dissection. Hypogastric artery is patent. External iliac artery patent. Common femoral artery patent. High bifurcation of the left common femoral artery. Circumflex femoral artery is patent. Profunda femoris patent. Minimal atherosclerotic changes of the SFA. Popliteal artery is patent with minimal atherosclerosis in the posterior knee. Trifurcation is patent with apparent in-line flow of 3 tibial vessels to the ankle. Veins: Unremarkable appearance of the venous system. Review of the MIP images confirms the above findings. NON-VASCULAR Lower chest: Respiratory motion limits evaluation of the lungs  for small nodules. Minimal atelectasis/ scarring. Hepatobiliary: Small nonspecific hypodensity of the bilateral liver lobe, likely benign cysts. Unchanged from the comparison. Pancreas: Unremarkable appearance of the pancreas. No pericholecystic fluid or inflammatory changes. Unremarkable ductal system. Spleen: Unremarkable. Adrenals/Urinary Tract: Unremarkable appearance of adrenal glands. Right: No hydronephrosis. Symmetric perfusion to the left. No nephrolithiasis. Unremarkable course of the right ureter. Cyst of the right kidney is unchanged. Left: No hydronephrosis. Symmetric perfusion to the right. No nephrolithiasis. Unremarkable course of the left ureter. Unremarkable appearance of the urinary  bladder . Stomach/Bowel: Unremarkable appearance of the stomach. Unremarkable appearance of small bowel. No evidence of obstruction. Minimal colonic diverticula. No associated inflammatory changes. Normal appendix. Lymphatic: Multiple lymph nodes in the right iliac nodal station. The largest measures 9 mm. Multiple bilateral inguinal lymph nodes. Largest on the left measures 13 mm. Mesenteric: No free fluid or air. No adenopathy. Reproductive: Transverse diameter prostate measures 6.3 cm. Right-sided hydrocele. Other: Umbilical hernia containing fat. Wide neck of the hernia sac contains short segment of colon loop. No evidence of obstruction. Musculoskeletal: No evidence of acute fracture. No bony canal narrowing. No significant degenerative changes of the hips. IMPRESSION: No acute vascular abnormality identified. Mild aortic atherosclerosis, with mild disease of iliac vessels and femoral popliteal system. Multiple bilateral inguinal lymph nodes with associated borderline enlarged lymph nodes of the right greater than left iliac nodal stations. These are nonspecific, potentially reactive. If there is concern for lymphoproliferative disorder, recommend correlation with lab values, and patient presentation. Right-sided hydrocele. Diverticular disease without evidence of acute diverticulitis. Signed, Yvone Neu. Loreta Ave, DO [facility] Electronically Signed   By: Gilmer Mor D.O.   On: 06/27/2016 13:22    Procedures Procedures (including critical care time)  Medications Ordered in ED Medications  fentaNYL (SUBLIMAZE) injection 50 mcg (50 mcg Intravenous Given 06/27/16 1251)  iopamidol (ISOVUE-370) 76 % injection (not administered)  vancomycin (VANCOCIN) 1,250 mg in sodium chloride 0.9 % 250 mL IVPB (not administered)  piperacillin-tazobactam (ZOSYN) IVPB 3.375 g (not administered)  piperacillin-tazobactam (ZOSYN) IVPB 3.375 g (0 g Intravenous Stopped  06/27/16 1206)  vancomycin (VANCOCIN) IVPB 1000 mg/200 mL premix (0 mg Intravenous Stopped 06/27/16 1323)  sodium chloride 0.9 % bolus 1,000 mL (0 mLs Intravenous Stopped 06/27/16 1323)    And  sodium chloride 0.9 % bolus 1,000 mL (0 mLs Intravenous Stopped 06/27/16 1211)    And  sodium chloride 0.9 % bolus 500 mL (500 mLs Intravenous New Bag/Given 06/27/16 1321)  iopamidol (ISOVUE-370) 76 % injection 100 mL (100 mLs Intravenous Contrast Given 06/27/16 1231)  acetaminophen (TYLENOL) tablet 1,000 mg (1,000 mg Oral Given 06/27/16 1341)  cefTRIAXone (ROCEPHIN) injection 250 mg (250 mg Intramuscular Given 06/27/16 1342)  doxycycline (VIBRA-TABS) tablet 100 mg (100 mg Oral Given 06/27/16 1342)  azithromycin (ZITHROMAX) tablet 1,000 mg (1,000 mg Oral Given 06/27/16 1341)     Initial Impression / Assessment and Plan / ED Course  I have reviewed the triage vital signs and the nursing notes.  Pertinent labs & imaging results that were available during my care of the patient were reviewed by me and considered in my medical decision making (see chart for details).     66 year old male with history of HIV presents with right severe leg pain in the inguinal region with swelling, fever, and a red area over his right distal leg. He is concerned that he has cellulitis of his right leg that is precipitating symptoms. I have concern initially the patient  may be septic given his vital signs with tachycardia and fever and high risk demographic so empiric vancomycin and Zosyn was ordered for cellulitis.  On exam the patient is exquisitely tender over his right groin with grossly enlarged lymph nodes bilaterally suggesting either primary or secondary infectious process. Considered proximal arterial compromise versus venous thrombosis with onset and severity of symptoms. CTA and lower extremity Doppler of right lower extremity will be performed to evaluate.  CT shows enlarged lymph nodes with widely patent arterial structures. When  confronted about sexual partners he admits to unprotected sex with his fiance and having a small painless genital ulcer for roughly a week about a month ago that resolved with topical creams.   Given his recent GU symptoms, findings on CT and physical exam with painful bilateral inguinal lymph node involvement, fever, body aches and malaise it is possible he has lymphogranuloma venereum. Cell counts are not concerning for a hematologic malignancy or sepsis. No signs of necrotizing fasciitis or other serious soft tissue infection. He will be covered empirically with Rocephin, azithromycin and 21 days of doxycycline pending urine test. We discussed need for treatment of partners.   He may have an early cellulitis of the right shin that could be producing reactive lymphangitis. He will be provided 7 days of Keflex for empiric coverage.   His vital signs improved after fluid administration and fever control, discussed need for follow-up with infectious disease to determine results of GC/chlamydia testing.  Final Clinical Impressions(s) / ED Diagnoses   Final diagnoses:  Cellulitis of right leg  LGV (lymphogranuloma venereum)  Acute lymphangitis    New Prescriptions New Prescriptions   CEPHALEXIN (KEFLEX) 500 MG CAPSULE    Take 1 capsule (500 mg total) by mouth 3 (three) times daily.   DOXYCYCLINE (VIBRAMYCIN) 100 MG CAPSULE    Take 1 capsule (100 mg total) by mouth 2 (two) times daily.   HYDROCODONE-ACETAMINOPHEN (NORCO/VICODIN) 5-325 MG TABLET    Take 1 tablet by mouth every 4 (four) hours as needed.     Lyndal Pulley, MD 06/27/16 [phone]

## 2016-06-27 NOTE — ED Triage Notes (Addendum)
Pt complaint new onset redness, swelling, and hardening to RLE; hx of cellulitis. Pt continues to verbalizes generalized body aches, right groin pain, chills, shakes, n/v onset this morning. Pt verbalizes "vomited once."

## 2016-06-27 NOTE — ED Notes (Signed)
Pt requested his temperature be taken=100.0. He stated he felt like he still has a fever.

## 2016-06-27 NOTE — ED Notes (Signed)
Pt ambulatory and independent at discharge.  Verbalized understanding of discharge instructions 

## 2016-06-27 NOTE — ED Notes (Signed)
Venous US at bedside.  

## 2016-06-27 NOTE — ED Notes (Signed)
Pt given something to drink and a sandwich

## 2016-06-29 LAB — GC/CHLAMYDIA PROBE AMP (~~LOC~~) NOT AT ARMC
Chlamydia: NEGATIVE
Neisseria Gonorrhea: NEGATIVE

## 2016-07-02 LAB — CULTURE, BLOOD (ROUTINE X 2)
CULTURE: NO GROWTH
CULTURE: NO GROWTH
SPECIAL REQUESTS: ADEQUATE
Special Requests: ADEQUATE

## 2016-07-28 ENCOUNTER — Other Ambulatory Visit (HOSPITAL_COMMUNITY)
Admission: RE | Admit: 2016-07-28 | Discharge: 2016-07-28 | Disposition: A | Discharge status: Home / Self Care | Payer: BLUE CROSS/BLUE SHIELD | Source: Ambulatory Visit | Attending: Internal Medicine | Admitting: Internal Medicine

## 2016-07-28 ENCOUNTER — Encounter: Payer: Self-pay | Admitting: Internal Medicine

## 2016-07-28 ENCOUNTER — Ambulatory Visit (INDEPENDENT_AMBULATORY_CARE_PROVIDER_SITE_OTHER): Payer: BLUE CROSS/BLUE SHIELD | Admitting: Internal Medicine

## 2016-07-28 VITALS — BP 151/96 | HR 88 | Temp 97.8°F | Wt 174.0 lb

## 2016-07-28 DIAGNOSIS — Z202 Contact with and (suspected) exposure to infections with a predominantly sexual mode of transmission: Secondary | ICD-10-CM | POA: Diagnosis not present

## 2016-07-28 DIAGNOSIS — B20 Human immunodeficiency virus [HIV] disease: Secondary | ICD-10-CM

## 2016-07-28 DIAGNOSIS — B353 Tinea pedis: Secondary | ICD-10-CM | POA: Diagnosis not present

## 2016-07-28 NOTE — Progress Notes (Signed)
RFV: hiv disease  Patient ID: Jamie MessingWalter Clark, male   DOB: 11/08/1950, 66 y.o.   MRN: [id number]  HPI 66yo M with hiv disease, CD 4 count 1080/VL 38 (mar 2018), he reports being back on track with taking his medication.he states that he had unprotected sex and contracted chlamydia. Still has ongoing tinea pedis despite creams and antifungal oral regimen  Outpatient Encounter Prescriptions as of 07/28/2016  Medication Sig  . abacavir-dolutegravir-lamiVUDine (TRIUMEQ) 600-50-300 MG tablet Take 1 tablet by mouth daily.  . hydrochlorothiazide (HYDRODIURIL) 25 MG tablet TAKE 1 TABLET(25 MG) BY MOUTH AT BEDTIME  . lisinopril (PRINIVIL,ZESTRIL) 20 MG tablet TAKE 1/2 TABLET BY MOUTH DAILY UNTIL NEXT APPOINTMENT OR 1 TABLET DAILY AS DIRECTED  . terbinafine (LAMISIL) 250 MG tablet Take 1 tablet (250 mg total) by mouth daily.  [name]. HYDROcodone-acetaminophen (NORCO/VICODIN) 5-325 MG tablet Take 1 tablet by mouth every 4 (four) hours as needed. (Patient not taking: Reported on 07/28/2016)   No facility-administered encounter medications on file as of 07/28/2016.      Patient Active Problem List   Diagnosis Date Noted  . Acute pain of left foot 04/30/2014  . Hyperglycemia 04/30/2014  . Cellulitis and abscess of left foot 04/30/2014  . Acute renal failure (HCC) 04/30/2014  . Tinea pedis of left foot 10/23/2013  . Phlebitis 04/01/2011    Class: History of  . INSOMNIA 04/14/2010  . Dyslipidemia 01/17/2009  . HTN (hypertension) 01/17/2009  . HIV infection (HCC) 12/18/2008  . Chronic viral hepatitis C (HCC) 07/13/2007     Health Maintenance Due  Topic Date Due  . TETANUS/TDAP  01/22/1971  . COLONOSCOPY  01/21/2002    Social History  Substance Use Topics  . Smoking status: Light Tobacco Smoker    Packs/day: 0.15    Types: Cigarettes  . Smokeless tobacco: Never Used     Comment: 1 a day  . Alcohol use No   Review of Systems + tinea pedis. Otherwise 12 point ros is negative Physical Exam     BP (!) 151/96   Pulse 88   Temp 97.8 F (36.6 C) (Oral)   Wt 174 lb (78.9 kg)   SpO2 98%   BMI 27.25 kg/m   Physical Exam  Constitutional: He is oriented to person, place, and time. He appears well-developed and well-nourished. No distress.  HENT:  Mouth/Throat: Oropharynx is clear and moist. No oropharyngeal exudate.  Cardiovascular: Normal rate, regular rhythm and normal heart sounds. Exam reveals no gallop and no friction rub.  No murmur heard.  Pulmonary/Chest: Effort normal and breath sounds normal. No respiratory distress. He has no wheezes.  Lymphadenopathy:  He has no cervical adenopathy.  Neurological: He is alert and oriented to person, place, and time.  Skin: Skin is unchanged Psychiatric: He has a normal mood and affect. His behavior is normal.    Lab Results  Component Value Date   CD4TCELL 46 05/27/2016   Lab Results  Component Value Date   CD4TABS 1,080 05/27/2016   CD4TABS 810 12/12/2015   CD4TABS 850 06/11/2015   Lab Results  Component Value Date   HIV1RNAQUANT 37 (H) 05/27/2016   Lab Results  Component Value Date   HEPBSAB NEG 05/06/2010   Lab Results  Component Value Date   LABRPR NON REAC 12/12/2015    CBC Lab Results  Component Value Date   WBC 10.3 06/27/2016   RBC 4.51 06/27/2016   HGB 14.5 06/27/2016   HCT 41.8 06/27/2016   PLT 268 06/27/2016  MCV 92.7 06/27/2016   MCH 32.2 06/27/2016   MCHC 34.7 06/27/2016   RDW 12.9 06/27/2016   LYMPHSABS 0.8 06/27/2016   MONOABS 0.8 06/27/2016   EOSABS 0.1 06/27/2016    BMET Lab Results  Component Value Date   NA 136 06/27/2016   K 3.4 (L) 06/27/2016   CL 105 06/27/2016   CO2 23 06/27/2016   GLUCOSE 126 (H) 06/27/2016   BUN 15 06/27/2016   CREATININE 1.25 (H) 06/27/2016   CALCIUM 9.0 06/27/2016   GFRNONAA 59 (L) 06/27/2016   GFRAA >60 06/27/2016      Assessment and Plan hiv infection = well controlled, continue on current regimen  Unprotected sex = will check rpr Urine  cytology Oral swab for GC/chlam  Tinea pedis = continue with moisture wicking socks, antifungal cream, will refer to podiatry

## 2016-07-29 LAB — RPR

## 2016-07-30 LAB — CYTOLOGY, (ORAL, ANAL, URETHRAL) ANCILLARY ONLY
CHLAMYDIA, DNA PROBE: NEGATIVE
NEISSERIA GONORRHEA: NEGATIVE

## 2016-07-30 LAB — URINE CYTOLOGY ANCILLARY ONLY
Chlamydia: NEGATIVE
NEISSERIA GONORRHEA: NEGATIVE

## 2016-08-29 DIAGNOSIS — B182 Chronic viral hepatitis C: Secondary | ICD-10-CM | POA: Insufficient documentation

## 2016-09-10 ENCOUNTER — Encounter: Payer: Self-pay | Admitting: Emergency Medicine

## 2016-09-10 ENCOUNTER — Emergency Department
Admission: EM | Admit: 2016-09-10 | Discharge: 2016-09-10 | Disposition: A | Discharge status: Home / Self Care | Payer: BLUE CROSS/BLUE SHIELD | Attending: Emergency Medicine | Admitting: Emergency Medicine

## 2016-09-10 DIAGNOSIS — I10 Essential (primary) hypertension: Secondary | ICD-10-CM | POA: Insufficient documentation

## 2016-09-10 DIAGNOSIS — R51 Headache: Secondary | ICD-10-CM | POA: Diagnosis not present

## 2016-09-10 DIAGNOSIS — E119 Type 2 diabetes mellitus without complications: Secondary | ICD-10-CM | POA: Insufficient documentation

## 2016-09-10 DIAGNOSIS — R42 Dizziness and giddiness: Secondary | ICD-10-CM

## 2016-09-10 DIAGNOSIS — F17210 Nicotine dependence, cigarettes, uncomplicated: Secondary | ICD-10-CM | POA: Diagnosis not present

## 2016-09-10 DIAGNOSIS — R110 Nausea: Secondary | ICD-10-CM | POA: Diagnosis not present

## 2016-09-10 DIAGNOSIS — R197 Diarrhea, unspecified: Secondary | ICD-10-CM | POA: Insufficient documentation

## 2016-09-10 DIAGNOSIS — L081 Erythrasma: Secondary | ICD-10-CM

## 2016-09-10 LAB — URINALYSIS, COMPLETE (UACMP) WITH MICROSCOPIC
BACTERIA UA: NONE SEEN
BILIRUBIN URINE: NEGATIVE
Glucose, UA: NEGATIVE mg/dL
KETONES UR: NEGATIVE mg/dL
LEUKOCYTES UA: NEGATIVE
NITRITE: NEGATIVE
PROTEIN: NEGATIVE mg/dL
Specific Gravity, Urine: 1.021 (ref 1.005–1.030)
pH: 5 (ref 5.0–8.0)

## 2016-09-10 LAB — BASIC METABOLIC PANEL
ANION GAP: 5 (ref 5–15)
BUN: 18 mg/dL (ref 6–20)
CO2: 25 mmol/L (ref 22–32)
Calcium: 8.9 mg/dL (ref 8.9–10.3)
Chloride: 107 mmol/L (ref 101–111)
Creatinine, Ser: 1.04 mg/dL (ref 0.61–1.24)
GFR calc Af Amer: >60 mL/min (ref >60)
GLUCOSE: 90 mg/dL (ref 65–99)
POTASSIUM: 3.6 mmol/L (ref 3.5–5.1)
Sodium: 137 mmol/L (ref 135–145)

## 2016-09-10 LAB — CBC
HEMATOCRIT: 37.8 % — AB (ref 40.0–52.0)
HEMOGLOBIN: 13 g/dL (ref 13.0–18.0)
MCH: 32.3 pg (ref 26.0–34.0)
MCHC: 34.5 g/dL (ref 32.0–36.0)
MCV: 93.6 fL (ref 80.0–100.0)
Platelets: 332 10*3/uL (ref 150–440)
RBC: 4.04 MIL/uL — ABNORMAL LOW (ref 4.40–5.90)
RDW: 14.5 % (ref 11.5–14.5)
WBC: 5.3 10*3/uL (ref 3.8–10.6)

## 2016-09-10 MED ORDER — IBUPROFEN 600 MG PO TABS
600.0000 mg | ORAL_TABLET | Freq: Three times a day (TID) | ORAL | 1 refills | Status: DC | PRN
Start: 2016-09-10 — End: 2016-12-24

## 2016-09-10 MED ORDER — KETOROLAC TROMETHAMINE 30 MG/ML IJ SOLN
15.0000 mg | Freq: Once | INTRAMUSCULAR | Status: AC
  Administered 2016-09-10: 15 mg via INTRAVENOUS
  Filled 2016-09-10: qty 1

## 2016-09-10 MED ORDER — SODIUM CHLORIDE 0.9 % IV SOLN
Freq: Once | INTRAVENOUS | Status: AC
  Administered 2016-09-10: 18:00:00 via INTRAVENOUS

## 2016-09-10 MED ORDER — MUPIROCIN 2 % EX OINT: TOPICAL_OINTMENT | CUTANEOUS | 0 refills | Status: DC

## 2016-09-10 NOTE — ED Notes (Signed)
ED Provider at bedside. 

## 2016-09-10 NOTE — ED Provider Notes (Signed)
[facility] Emergency Department Provider Note       Time seen: ----------------------------------------- 5:50 PM on 09/10/2016 -----------------------------------------     I have reviewed the triage vital signs and the nursing notes.   HISTORY   Chief Complaint Emesis; Dizziness; and Headache    HPI Jamie Clark is a 66 y.o. male who presents to the ED for dizziness, nausea, headache and some diarrhea. Patient states he has had some sinus pressure along with mild diarrhea. His fiance feels like he may be dehydrated. Patient states he works in very hot conditions. He denies fevers or chills but is concerned about drainage she has between the webspaces of his toes on left foot.   Past Medical History:  Diagnosis Date  . Diabetes mellitus without complication (HCC)   . Hepatitis C, chronic (HCC)   . HIV positive (HCC)   . Hypertension     Patient Active Problem List   Diagnosis Date Noted  . Acute pain of left foot 04/30/2014  . Hyperglycemia 04/30/2014  . Cellulitis and abscess of left foot 04/30/2014  . Acute renal failure (HCC) 04/30/2014  . Tinea pedis of left foot 10/23/2013  . Phlebitis 04/01/2011    Class: History of  . INSOMNIA 04/14/2010  . Dyslipidemia 01/17/2009  . HTN (hypertension) 01/17/2009  . HIV infection (HCC) 12/18/2008  . Chronic viral hepatitis C (HCC) 07/13/2007    No past surgical history on file.  Allergies Patient has no known allergies.  Social History Social History  Substance Use Topics  . Smoking status: Light Tobacco Smoker    Packs/day: 0.15    Types: Cigarettes  . Smokeless tobacco: Never Used     Comment: 1 a day  . Alcohol use No    Review of Systems Constitutional: Negative for fever. Eyes: Negative for vision changes ENT:  Negative for congestion, sore throat. Positive for sinus pressure bilaterally Cardiovascular: Negative for chest pain. Respiratory: Negative for shortness of  breath. Gastrointestinal: Negative for abdominal pain, vomiting and diarrhea. Genitourinary: Negative for dysuria. Musculoskeletal: Negative for back pain. Skin: Positive for drainage and odor from the webspaces of the left foot Neurological: Positive for headache  All systems negative/normal/unremarkable except as stated in the HPI  ____________________________________________   PHYSICAL EXAM:  VITAL SIGNS: ED Triage Vitals  Enc Vitals Group     BP 09/10/16 1605 (!) 153/90     Pulse Rate 09/10/16 1605 66     Resp 09/10/16 1605 18     Temp 09/10/16 1605 98.3 F (36.8 C)     Temp Source 09/10/16 1605 Oral     SpO2 09/10/16 1605 98 %     Weight 09/10/16 1604 175 lb (79.4 kg)     Height 09/10/16 1604 5\' 8"  (1.727 m)     Head Circumference --      Peak Flow --      Pain Score 09/10/16 1603 8     Pain Loc --      Pain Edu? --      Excl. in GC? --     Constitutional: Alert and oriented. Well appearing and in no distress. Eyes: Conjunctivae are normal. Normal extraocular movements. ENT   Head: Normocephalic and atraumatic.Mild tenderness over the frontal and maxillary sinuses   Nose: No congestion/rhinnorhea.   Mouth/Throat: Mucous membranes are moist.   Neck: No stridor. Cardiovascular: Normal rate, regular rhythm. No murmurs, rubs, or gallops, Good distal pulses Respiratory: Normal respiratory effort without tachypnea nor retractions. Breath sounds are  clear and equal bilaterally. No wheezes/rales/rhonchi. Gastrointestinal: Soft and nontender. Normal bowel sounds Musculoskeletal: Nontender with normal range of motion in extremities. No lower extremity tenderness nor edema. Neurologic:  Normal speech and language. No gross focal neurologic deficits are appreciated.  Skin:  Drainage and odor coming from the webspaces of the left foot. No surrounding erythema or induration. Psychiatric: Mood and affect are normal. Speech and behavior are normal.   ____________________________________________  EKG: Interpreted by me. sinus rhythm rate of 65 bpm, normal PR normal, normal QRS size, normal QT, possible septal infarct age indeterminate.  ____________________________________________  ED COURSE:  Pertinent labs & imaging results that were available during my care of the patient were reviewed by me and considered in my medical decision making (see chart for details). Patient presents for  multiple complaints, we will assess with labs and imaging as indicated.   Procedures ____________________________________________   LABS (pertinent positives/negatives)  Labs Reviewed  CBC - Abnormal; Notable for the following:       Result Value   RBC 4.04 (*)    HCT 37.8 (*)    All other components within normal limits  URINALYSIS, COMPLETE (UACMP) WITH MICROSCOPIC - Abnormal; Notable for the following:    Color, Urine YELLOW (*)    APPearance CLEAR (*)    Hgb urine dipstick SMALL (*)    Squamous Epithelial / LPF 0-5 (*)    All other components within normal limits  BASIC METABOLIC PANEL   ___________________________________________  FINAL ASSESSMENT AND PLAN  Mild dehydration, sinusitis, erythrasma  Plan: Patient's labs and imaging were dictated above. Patient had presented for Multiple complaints but appears well. He likely was mildly dehydrated or head heat related illness today on top of mild sinusitis. He does not need antibiotics orally at this point, but will be prescribed antibiotic ointment to place between his left toes. They've advised against excessive moisture developing in his shoes while working. He is stable for outpatient follow-up.   Emily Filbert, MD   Note: This note was generated in part or whole with voice recognition software. Voice recognition is usually quite accurate but there are transcription errors that can and very often do occur. I apologize for any typographical errors that were not detected and  corrected.     Emily Filbert, MD 09/10/16 1911

## 2016-09-10 NOTE — ED Triage Notes (Signed)
Pt reports dizziness, nausea, headache and left foot athlete's foot. Denies chest pain. Pt reports a little diarrhea. Denies shortness of breath. Pt reports vomited once today.

## 2016-09-22 ENCOUNTER — Telehealth: Payer: Self-pay | Admitting: *Deleted

## 2016-09-22 DIAGNOSIS — B20 Human immunodeficiency virus [HIV] disease: Secondary | ICD-10-CM

## 2016-09-22 MED ORDER — ABACAVIR-DOLUTEGRAVIR-LAMIVUD 600-50-300 MG PO TABS: 1.0000 | ORAL_TABLET | Freq: Every day | ORAL | 5 refills | Status: DC

## 2016-09-22 NOTE — Telephone Encounter (Signed)
Refill request, Triumeq

## 2016-09-28 ENCOUNTER — Encounter: Payer: Self-pay | Admitting: Podiatry

## 2016-10-22 ENCOUNTER — Ambulatory Visit: Payer: Self-pay | Admitting: Podiatry

## 2016-10-27 ENCOUNTER — Ambulatory Visit: Payer: BLUE CROSS/BLUE SHIELD | Admitting: Internal Medicine

## 2016-11-13 ENCOUNTER — Encounter: Payer: Self-pay | Admitting: Internal Medicine

## 2016-11-19 ENCOUNTER — Ambulatory Visit: Payer: BLUE CROSS/BLUE SHIELD | Admitting: Podiatry

## 2016-11-25 ENCOUNTER — Ambulatory Visit (HOSPITAL_COMMUNITY)
Admission: EM | Admit: 2016-11-25 | Discharge: 2016-11-25 | Disposition: A | Discharge status: Home / Self Care | Payer: BLUE CROSS/BLUE SHIELD | Attending: Family Medicine | Admitting: Family Medicine

## 2016-11-25 ENCOUNTER — Encounter (HOSPITAL_COMMUNITY): Payer: Self-pay | Admitting: Emergency Medicine

## 2016-11-25 DIAGNOSIS — L03115 Cellulitis of right lower limb: Secondary | ICD-10-CM | POA: Diagnosis not present

## 2016-11-25 MED ORDER — CEFTRIAXONE SODIUM 1 G IJ SOLR
1.0000 g | Freq: Once | INTRAMUSCULAR | Status: AC
  Administered 2016-11-25: 1 g via INTRAMUSCULAR

## 2016-11-25 MED ORDER — CEFTRIAXONE SODIUM 1 G IJ SOLR
INTRAMUSCULAR | Status: AC
  Filled 2016-11-25: qty 10

## 2016-11-25 MED ORDER — AMOXICILLIN-POT CLAVULANATE 875-125 MG PO TABS: 1.0000 | ORAL_TABLET | Freq: Two times a day (BID) | ORAL | 0 refills | Status: DC

## 2016-11-25 MED ORDER — ACETAMINOPHEN 325 MG PO TABS
ORAL_TABLET | ORAL | Status: AC
  Filled 2016-11-25: qty 2

## 2016-11-25 MED ORDER — ACETAMINOPHEN 325 MG PO TABS
650.0000 mg | ORAL_TABLET | Freq: Once | ORAL | Status: AC
  Administered 2016-11-25: 650 mg via ORAL

## 2016-11-25 NOTE — ED Triage Notes (Signed)
[facility] a [facility] of upper right leg pain and swelling and redness to the lower right leg.

## 2016-11-25 NOTE — Progress Notes (Signed)
This encounter was created in error - please disregard.

## 2016-11-25 NOTE — ED Provider Notes (Signed)
  [facility]   [id number] 11/25/16 Arrival Time: [id number]  ASSESSMENT & PLAN:  1. Cellulitis of right lower extremity     Meds ordered this encounter  Medications  . acetaminophen (TYLENOL) tablet 650 mg  . cefTRIAXone (ROCEPHIN) injection 1 g  . amoxicillin-clavulanate (AUGMENTIN) 875-125 MG tablet    Sig: Take 1 tablet by mouth 2 (two) times daily.    Dispense:  14 tablet    Refill:  0    Order Specific Question:   Supervising Provider    Answer:   Mardella LaymanHAGLER, BRIAN [[id number]]   Return to clinic in 2 days for recheck, or go to the ER sooner if symptoms worsen in anyway  Reviewed expectations re: course of current medical issues. Questions answered. Outlined signs and symptoms indicating need for more acute intervention. Patient verbalized understanding. After Visit Summary given.   SUBJECTIVE:   Jamie Clark is a 66 y.o. male who presents to the urgent care with complaint of leg pain. He states he gets frequent episodes of cellulitis, he was having an episode of cellulitis now. States she woke up with these symptoms this morning, denies any nausea or vomiting, is feeling fatigued, no loss of appetite, shortness of breath, chest pain or palpitations, or other symptoms. He does have a past history of HIV, last CD4 count was in September 2017, was 810, however viral load has been decreased.  ROS: As per HPI.   OBJECTIVE:  Vitals:   11/25/16 1909  BP: (!) 163/92  Pulse: (!) 105  Resp: 20  Temp: (!) 103 F (39.4 C)  TempSrc: Oral  SpO2: 96%    General appearance: alert; no distress Eyes: PERRLA; EOMI; conjunctiva normal HENT: normocephalic; atraumatic; External ears normal without trauma; nasal mucosa normal; oral mucosa normal Neck: supple Lungs: clear to auscultation bilaterally Heart: regular rate and rhythm Abdomen: soft, non-tender; bowel sounds normal; no masses or organomegaly; no guarding or rebound tenderness Back: no CVA tenderness Extremities: no  cyanosis or edema; symmetrical with no gross deformities, right lower leg area of erythema, and tenderness, there is warm to touch, consistent with cellulitis Skin: warm and dry Neurologic: normal gait; normal symmetric reflexes Psychological: alert and cooperative; normal mood and affect  Labs:Labs Reviewed - No data to display  Imaging: No results found.  No Known Allergies  Past Medical History:  Diagnosis Date  . Diabetes mellitus without complication (HCC)   . Hepatitis C, chronic (HCC)   . HIV positive (HCC)   . Hypertension    Social History   Social History  . Marital status: Divorced    Spouse name: N/A  . Number of children: N/A  . Years of education: N/A   Occupational History  . Not on file.   Social History Main Topics  . Smoking status: Light Tobacco Smoker    Packs/day: 0.15    Types: Cigarettes  . Smokeless tobacco: Never Used     Comment: 1 a day  . Alcohol use No  . Drug use: No  . Sexual activity: No     Comment: given condms   Other Topics Concern  . Not on file   Social History Narrative  . No narrative on file   History reviewed. No pertinent family history. History reviewed. No pertinent surgical history.   Dorena BodoKennard, Javelle Donigan, NP 11/25/16 1958

## 2016-11-25 NOTE — Discharge Instructions (Signed)
If you still have a fever in the morning, go to the emergency room. Otherwise if you showing improvements, return to clinic in 2 days for recheck on your cellulitis. You've received a shot here tonight, and started on Augmentin 1 tablet twice a day. I recommend Tylenol Motrin for pain, and for fever. If at anytime your symptoms worsen, go straight to the emergency room.

## 2016-12-24 ENCOUNTER — Encounter: Payer: Self-pay | Admitting: Internal Medicine

## 2016-12-24 ENCOUNTER — Other Ambulatory Visit: Payer: Self-pay | Admitting: *Deleted

## 2016-12-24 ENCOUNTER — Other Ambulatory Visit (HOSPITAL_COMMUNITY)
Admission: RE | Admit: 2016-12-24 | Discharge: 2016-12-24 | Disposition: A | Discharge status: Home / Self Care | Payer: BLUE CROSS/BLUE SHIELD | Source: Ambulatory Visit | Attending: Internal Medicine | Admitting: Internal Medicine

## 2016-12-24 ENCOUNTER — Ambulatory Visit (INDEPENDENT_AMBULATORY_CARE_PROVIDER_SITE_OTHER): Payer: BLUE CROSS/BLUE SHIELD | Admitting: Internal Medicine

## 2016-12-24 VITALS — BP 166/100 | HR 77 | Temp 98.1°F | Ht 67.0 in | Wt 165.0 lb

## 2016-12-24 DIAGNOSIS — I10 Essential (primary) hypertension: Secondary | ICD-10-CM | POA: Diagnosis not present

## 2016-12-24 DIAGNOSIS — F320 Major depressive disorder, single episode, mild: Secondary | ICD-10-CM | POA: Diagnosis not present

## 2016-12-24 DIAGNOSIS — B20 Human immunodeficiency virus [HIV] disease: Secondary | ICD-10-CM

## 2016-12-24 DIAGNOSIS — Z23 Encounter for immunization: Secondary | ICD-10-CM | POA: Diagnosis not present

## 2016-12-24 MED ORDER — AMLODIPINE BESYLATE 10 MG PO TABS
10.0000 mg | ORAL_TABLET | Freq: Every day | ORAL | 11 refills | Status: DC
Start: 2016-12-24 — End: 2017-03-03

## 2016-12-24 MED ORDER — AMLODIPINE BESYLATE 10 MG PO TABS: 10.0000 mg | ORAL_TABLET | Freq: Every day | ORAL | 11 refills | Status: DC

## 2016-12-24 NOTE — Progress Notes (Signed)
RFV: follow up for hiv disease  Patient ID: Jamie Clark, male   DOB: [birth year], 66 y.o.   MRN: [id number]  HPI 65yoM with well controlled hiv disease, CD4 count of 1080/VL 37 on triumeq. He reports that he is currently living with a religious couple to save money for his own place but feels like he is imposing. He is also broke up from his recent relationship. All of which is causing him some depression. Noticeably having labile moods, easily becomes tearful.  Outpatient Encounter Prescriptions as of 12/24/2016  Medication Sig  . abacavir-dolutegravir-lamiVUDine (TRIUMEQ) 600-50-300 MG tablet Take 1 tablet by mouth daily.  . hydrochlorothiazide (HYDRODIURIL) 25 MG tablet TAKE 1 TABLET(25 MG) BY MOUTH AT BEDTIME  . lisinopril (PRINIVIL,ZESTRIL) 20 MG tablet TAKE 1/2 TABLET BY MOUTH DAILY UNTIL NEXT APPOINTMENT OR 1 TABLET DAILY AS DIRECTED  . [DISCONTINUED] ibuprofen (ADVIL,MOTRIN) 600 MG tablet Take 1 tablet (600 mg total) by mouth every 8 (eight) hours as needed.  . [DISCONTINUED] mupirocin ointment (BACTROBAN) 2 % Apply to affected area 3 times daily  . [DISCONTINUED] terbinafine (LAMISIL) 250 MG tablet Take 1 tablet (250 mg total) by mouth daily.  . [DISCONTINUED] amoxicillin-clavulanate (AUGMENTIN) 875-125 MG tablet Take 1 tablet by mouth 2 (two) times daily. (Patient not taking: Reported on 12/24/2016)   No facility-administered encounter medications on file as of 12/24/2016.      Patient Active Problem List   Diagnosis Date Noted  . Acute pain of left foot 04/30/2014  . Hyperglycemia 04/30/2014  . Cellulitis and abscess of left foot 04/30/2014  . Acute renal failure (HCC) 04/30/2014  . Tinea pedis of left foot 10/23/2013  . Phlebitis 04/01/2011    Class: History of  . INSOMNIA 04/14/2010  . Dyslipidemia 01/17/2009  . HTN (hypertension) 01/17/2009  . HIV infection (HCC) 12/18/2008  . Chronic viral hepatitis C (HCC) 07/13/2007     Health Maintenance Due  Topic Date  Due  . TETANUS/TDAP  01/22/1971  . COLONOSCOPY  01/21/2002  . INFLUENZA VACCINE  10/21/2016     Review of Systems Per hpi, otherwise 12 point ros is negative Physical Exam   BP (!) 166/100   Pulse 77   Temp 98.1 F (36.7 C) (Oral)   Ht  (1.702 m)   Wt 165 lb (74.8 kg)   BMI 25.84 kg/m   Physical Exam  Constitutional: He is oriented to person, place, and time. He appears well-developed and well-nourished. No distress.  HENT:  Mouth/Throat: Oropharynx is clear and moist. No oropharyngeal exudate.  Cardiovascular: Normal rate, regular rhythm and normal heart sounds. Exam reveals no gallop and no friction rub.  No murmur heard.  Pulmonary/Chest: Effort normal and breath sounds normal. No respiratory distress. He has no wheezes.  Abdominal: Soft. Bowel sounds are normal. He exhibits no distension. There is no tenderness.  Lymphadenopathy:  He has no cervical adenopathy.  Neurological: He is alert and oriented to person, place, and time.  Skin: Skin is warm and dry. No rash noted. No erythema.  Psychiatric: He has a normal mood and affect. His behavior is normal.    Lab Results  Component Value Date   CD4TCELL 46 05/27/2016   Lab Results  Component Value Date   CD4TABS 1,080 05/27/2016   CD4TABS 810 12/12/2015   CD4TABS 850 06/11/2015   Lab Results  Component Value Date   HIV1RNAQUANT 37 (H) 05/27/2016   Lab Results  Component Value Date   HEPBSAB NEG 05/06/2010   Lab Results  Component Value Date   LABRPR NON REAC 07/28/2016    CBC Lab Results  Component Value Date   WBC 5.3 09/10/2016   RBC 4.04 (L) 09/10/2016   HGB 13.0 09/10/2016   HCT 37.8 (L) 09/10/2016   PLT 332 09/10/2016   MCV 93.6 09/10/2016   MCH 32.3 09/10/2016   MCHC 34.5 09/10/2016   RDW 14.5 09/10/2016   LYMPHSABS 0.8 06/27/2016   MONOABS 0.8 06/27/2016   EOSABS 0.1 06/27/2016    BMET Lab Results  Component Value Date   NA 137 09/10/2016   K 3.6 09/10/2016   CL 107 09/10/2016    CO2 25 09/10/2016   GLUCOSE 90 09/10/2016   BUN 18 09/10/2016   CREATININE 1.04 09/10/2016   CALCIUM 8.9 09/10/2016   GFRNONAA >60 09/10/2016   GFRAA >60 09/10/2016      Assessment and Plan  hiv disease = well controlled continue on current dose  htn = poorly controlled. Currently on lisinopril 20, hctz 25, and will add amlodipine  daily Come back next week for BP check and counseling  Depression = counseling introduction but schedule would not work. Recommend to go to family services. He would like to wait on starting SSRI. No thoughts for self harm

## 2016-12-28 LAB — URINE CYTOLOGY ANCILLARY ONLY
Chlamydia: NEGATIVE
Neisseria Gonorrhea: NEGATIVE

## 2016-12-31 ENCOUNTER — Ambulatory Visit: Payer: BLUE CROSS/BLUE SHIELD

## 2017-01-27 ENCOUNTER — Ambulatory Visit: Payer: BLUE CROSS/BLUE SHIELD | Admitting: Internal Medicine

## 2017-02-24 ENCOUNTER — Other Ambulatory Visit: Payer: Self-pay

## 2017-02-24 DIAGNOSIS — R69 Illness, unspecified: Secondary | ICD-10-CM | POA: Diagnosis not present

## 2017-02-24 DIAGNOSIS — B20 Human immunodeficiency virus [HIV] disease: Secondary | ICD-10-CM

## 2017-02-24 LAB — COMPLETE METABOLIC PANEL WITH GFR
AG Ratio: 1 (calc) (ref 1.0–2.5)
ALKALINE PHOSPHATASE (APISO): 101 U/L (ref 40–115)
ALT: 14 U/L (ref 9–46)
AST: 20 U/L (ref 10–35)
Albumin: 3.7 g/dL (ref 3.6–5.1)
BUN: 15 mg/dL (ref 7–25)
CHLORIDE: 107 mmol/L (ref 98–110)
CO2: 25 mmol/L (ref 20–32)
Calcium: 9.3 mg/dL (ref 8.6–10.3)
Creat: 1.06 mg/dL (ref 0.70–1.25)
GFR, EST AFRICAN AMERICAN: 85 mL/min/{1.73_m2} (ref >=60)
GFR, Est Non African American: 73 mL/min/{1.73_m2} (ref >=60)
GLUCOSE: 95 mg/dL (ref 65–99)
Globulin: 3.7 g/dL (calc) (ref 1.9–3.7)
Potassium: 4 mmol/L (ref 3.5–5.3)
Sodium: 139 mmol/L (ref 135–146)
TOTAL PROTEIN: 7.4 g/dL (ref 6.1–8.1)
Total Bilirubin: 0.7 mg/dL (ref 0.2–1.2)

## 2017-02-24 LAB — CBC WITH DIFFERENTIAL/PLATELET
BASOS PCT: 0.4 %
Basophils Absolute: 19 cells/uL (ref 0–200)
EOS PCT: 3.5 %
Eosinophils Absolute: 168 cells/uL (ref 15–500)
HEMATOCRIT: 40 % (ref 38.5–50.0)
HEMOGLOBIN: 14 g/dL (ref 13.2–17.1)
LYMPHS ABS: 2419 {cells}/uL (ref 850–3900)
MCH: 32.6 pg (ref 27.0–33.0)
MCHC: 35 g/dL (ref 32.0–36.0)
MCV: 93.2 fL (ref 80.0–100.0)
MPV: 9.9 fL (ref 7.5–12.5)
Monocytes Relative: 9.1 %
NEUTROS ABS: 1757 {cells}/uL (ref 1500–7800)
Neutrophils Relative %: 36.6 %
Platelets: 287 10*3/uL (ref 140–400)
RBC: 4.29 10*6/uL (ref 4.20–5.80)
RDW: 15.2 % — ABNORMAL HIGH (ref 11.0–15.0)
Total Lymphocyte: 50.4 %
WBC mixed population: 437 cells/uL (ref 200–950)
WBC: 4.8 10*3/uL (ref 3.8–10.8)

## 2017-02-25 ENCOUNTER — Other Ambulatory Visit: Payer: Self-pay | Admitting: Internal Medicine

## 2017-02-25 DIAGNOSIS — B20 Human immunodeficiency virus [HIV] disease: Secondary | ICD-10-CM

## 2017-02-26 LAB — HIV-1 RNA QUANT-NO REFLEX-BLD
HIV 1 RNA QUANT: 1520 {copies}/mL — AB
HIV-1 RNA QUANT, LOG: 3.18 {Log_copies}/mL — AB

## 2017-02-26 LAB — T-HELPER CELL (CD4) - (RCID CLINIC ONLY)
CD4 % Helper T Cell: 34 % (ref 33–55)
CD4 T Cell Abs: 950 /uL (ref 400–2700)

## 2017-03-03 ENCOUNTER — Encounter (HOSPITAL_COMMUNITY): Payer: Self-pay | Admitting: *Deleted

## 2017-03-03 ENCOUNTER — Ambulatory Visit (HOSPITAL_COMMUNITY)
Admission: EM | Admit: 2017-03-03 | Discharge: 2017-03-03 | Disposition: A | Discharge status: Home / Self Care | Payer: Medicare HMO | Attending: Family Medicine | Admitting: Family Medicine

## 2017-03-03 ENCOUNTER — Other Ambulatory Visit: Payer: Self-pay

## 2017-03-03 DIAGNOSIS — R1084 Generalized abdominal pain: Secondary | ICD-10-CM

## 2017-03-03 DIAGNOSIS — K5900 Constipation, unspecified: Secondary | ICD-10-CM

## 2017-03-03 DIAGNOSIS — R110 Nausea: Secondary | ICD-10-CM

## 2017-03-03 MED ORDER — ONDANSETRON 4 MG PO TBDP: 4.0000 mg | ORAL_TABLET | Freq: Three times a day (TID) | ORAL | 0 refills | Status: DC | PRN

## 2017-03-03 NOTE — ED Triage Notes (Signed)
Per pt he as really bad abd pain, per pt pain is in the center of his stomach and radiating to his lower back, per pt he is having trouble BM, Per pt this started yesterday

## 2017-03-03 NOTE — Discharge Instructions (Signed)
Please return here or to the Emergency Department immediately should you feel worse in any way or have any of the following symptoms: increasing or different abdominal pain, persistent vomiting, fevers, or shaking chills. ° ° °

## 2017-03-08 NOTE — ED Provider Notes (Signed)
[facility]   [id number] 03/03/17 Arrival Time: 1312  ASSESSMENT & PLAN:  1. Generalized abdominal pain   2. Nausea without vomiting   3. Constipation, unspecified constipation type     Meds ordered this encounter  Medications  . ondansetron (ZOFRAN-ODT) 4 MG disintegrating tablet    Sig: Take 1 tablet (4 mg total) by mouth every 8 (eight) hours as needed for nausea or vomiting.    Dispense:  15 tablet    Refill:  0   Patient reports having a "massive" bowel movement in office. Symptoms have resolved. Still would like to have some Zofran if needed. Declines ordered imaging today. Will f/u as needed.  Reviewed expectations re: course of current medical issues. Questions answered. Outlined signs and symptoms indicating need for more acute intervention. Patient verbalized understanding. After Visit Summary given.   SUBJECTIVE:  Jamie Clark is a 66 y.o. male who presents with complaint of abdominal discomfort. Onset gradual, 1-2 days ago. Described as "full feeling and sharp at times". Location: "moves around". Described symptoms are stable since beginning. Aggravating factors: none. Alleviating factors: none. Associated symptoms: constipation and mild nausea without emesis. The patient denies belching, chills and fever. Appetite: normal. PO intake: normal. Ambulatory without assistance. Urinary symptoms: none. Last bowel movement approx 4 days ago without blood. OTC treatment: None.  History of similar symptoms: No.  History reviewed. No pertinent surgical history.  ROS: As per HPI.  OBJECTIVE:  Vitals:   03/03/17 1406  BP: (!) 156/89  Pulse: 64  Temp: 98.2 F (36.8 C)  TempSrc: Oral  SpO2: 100%    General appearance: alert; no distress Lungs: clear to auscultation bilaterally Heart: regular rate and rhythm Abdomen: soft; non-distended; no tenderness - "just feels full"; bowel sounds present; no masses or organomegaly; no guarding or rebound  tenderness Back: no CVA tenderness Extremities: no edema; symmetrical with no gross deformities Skin: warm and dry Neurologic: normal gait Psychological: alert and cooperative; normal mood and affect   No Known Allergies                                             Past Medical History:  Diagnosis Date  . Diabetes mellitus without complication (HCC)   . Hepatitis C, chronic (HCC)   . HIV positive (HCC)   . Hypertension    Social History   Socioeconomic History  . Marital status: Divorced    Spouse name: Not on file  . Number of children: Not on file  . Years of education: Not on file  . Highest education level: Not on file  Social Needs  . Financial resource strain: Not on file  . Food insecurity - worry: Not on file  . Food insecurity - inability: Not on file  . Transportation needs - medical: Not on file  . Transportation needs - non-medical: Not on file  Occupational History  . Not on file  Tobacco Use  . Smoking status: Light Tobacco Smoker    Packs/day: 0.15    Types: Cigarettes  . Smokeless tobacco: Never Used  . Tobacco comment: 1 a day  Substance and Sexual Activity  . Alcohol use: No    Alcohol/week: 0.0 oz  . Drug use: No  . Sexual activity: No    Partners: Female    Comment: given condms  Other Topics Concern  . Not on file  Social History Narrative  . Not on file   Family History  Problem Relation Age of Onset  . Cancer Mother   . Hypertension Mother   . Cancer Father   . Hypertension Father      Mardella LaymanHagler, Camaron Cammack, MD 03/08/17 [phone]

## 2017-03-09 ENCOUNTER — Ambulatory Visit: Payer: Self-pay | Admitting: Internal Medicine

## 2017-03-10 ENCOUNTER — Encounter: Payer: Self-pay | Admitting: Internal Medicine

## 2017-03-10 ENCOUNTER — Ambulatory Visit (INDEPENDENT_AMBULATORY_CARE_PROVIDER_SITE_OTHER): Payer: Medicare HMO | Admitting: Internal Medicine

## 2017-03-10 ENCOUNTER — Ambulatory Visit: Payer: Self-pay | Admitting: Internal Medicine

## 2017-03-10 VITALS — BP 154/91 | HR 67 | Temp 97.9°F | Wt 160.0 lb

## 2017-03-10 DIAGNOSIS — R69 Illness, unspecified: Secondary | ICD-10-CM | POA: Diagnosis not present

## 2017-03-10 DIAGNOSIS — Z9119 Patient's noncompliance with other medical treatment and regimen: Secondary | ICD-10-CM | POA: Diagnosis not present

## 2017-03-10 DIAGNOSIS — Z23 Encounter for immunization: Secondary | ICD-10-CM

## 2017-03-10 DIAGNOSIS — B20 Human immunodeficiency virus [HIV] disease: Secondary | ICD-10-CM | POA: Diagnosis not present

## 2017-03-10 DIAGNOSIS — I10 Essential (primary) hypertension: Secondary | ICD-10-CM

## 2017-03-10 DIAGNOSIS — Z91199 Patient's noncompliance with other medical treatment and regimen due to unspecified reason: Secondary | ICD-10-CM

## 2017-03-10 MED ORDER — ENSURE PO LIQD: 237.0000 mL | Freq: Two times a day (BID) | ORAL | 5 refills | Status: DC

## 2017-03-10 MED ORDER — LISINOPRIL 10 MG PO TABS: 10.0000 mg | ORAL_TABLET | Freq: Every day | ORAL | 11 refills | Status: DC

## 2017-03-10 MED ORDER — ABACAVIR-DOLUTEGRAVIR-LAMIVUD 600-50-300 MG PO TABS
1.0000 | ORAL_TABLET | Freq: Every day | ORAL | 5 refills | Status: DC
Start: 2017-03-10 — End: 2018-04-01

## 2017-03-10 MED ORDER — HYDROCHLOROTHIAZIDE 25 MG PO TABS: 25.0000 mg | ORAL_TABLET | Freq: Every day | ORAL | 4 refills | Status: DC

## 2017-03-10 MED ORDER — MULTIVITAMIN MEN 50+ PO TABS: 1.0000 | ORAL_TABLET | Freq: Every day | ORAL | 11 refills | Status: DC

## 2017-03-10 NOTE — Progress Notes (Signed)
RFV: follow up for hiv disease  Patient ID: Jamie Clark, male   DOB: 02/08/1951, 66 y.o.   MRN: [id number]  HPI  Jamie Clark is a 66 yo M with hiv disease, CD 4 count of 950/VL 1520. Currently on triumeq. Did miss several doses while he was moving.  Has lost his job from [employer]. He reports being Depressed, loss of appetite, with associated weight loss. No thoughts of harm to self or others. Outpatient Encounter Medications as of 03/10/2017  Medication Sig  . hydrochlorothiazide (HYDRODIURIL) 25 MG tablet TAKE 1 TABLET(25 MG) BY MOUTH AT BEDTIME  . TRIUMEQ 600-50-300 MG tablet TAKE ONE TABLET BY MOUTH EVERY DAY  . ondansetron (ZOFRAN-ODT) 4 MG disintegrating tablet Take 1 tablet (4 mg total) by mouth every 8 (eight) hours as needed for nausea or vomiting. (Patient not taking: Reported on 03/10/2017)   No facility-administered encounter medications on file as of 03/10/2017.      Patient Active Problem List   Diagnosis Date Noted  . Acute pain of left foot 04/30/2014  . Hyperglycemia 04/30/2014  . Cellulitis and abscess of left foot 04/30/2014  . Acute renal failure (HCC) 04/30/2014  . Tinea pedis of left foot 10/23/2013  . Phlebitis 04/01/2011    Class: History of  . INSOMNIA 04/14/2010  . Dyslipidemia 01/17/2009  . HTN (hypertension) 01/17/2009  . HIV infection (HCC) 12/18/2008  . Chronic viral hepatitis C (HCC) 07/13/2007     Health Maintenance Due  Topic Date Due  . TETANUS/TDAP  01/22/1971  . COLONOSCOPY  01/21/2002  . PNA vac Low Risk Adult (1 of 2 - PCV13) 01/21/2017     Review of Systems 12 point ros is negative except what is mentioned above Physical Exam   BP (!) 154/91   Pulse 67   Temp 97.9 F (36.6 C)   Wt 160 lb (72.6 kg)   BMI 25.06 kg/m   Physical Exam  Constitutional: He is oriented to person, place, and time. He appears well-developed and well-nourished. No distress.  HENT:  Mouth/Throat: Oropharynx is clear and moist. No oropharyngeal  exudate.  Cardiovascular: Normal rate, regular rhythm and normal heart sounds. Exam reveals no gallop and no friction rub.  No murmur heard.  Pulmonary/Chest: Effort normal and breath sounds normal. No respiratory distress. He has no wheezes.  Lymphadenopathy:  He has no cervical adenopathy.  Neurological: He is alert and oriented to person, place, and time.  Skin: Skin is warm and dry. No rash noted. No erythema.  Psychiatric: somber. Slightly flat affect.   Lab Results  Component Value Date   CD4TCELL 34 02/24/2017   Lab Results  Component Value Date   CD4TABS 950 02/24/2017   CD4TABS 1,080 05/27/2016   CD4TABS 810 12/12/2015   Lab Results  Component Value Date   HIV1RNAQUANT 1,520 (H) 02/24/2017   Lab Results  Component Value Date   HEPBSAB NEG 05/06/2010   Lab Results  Component Value Date   LABRPR NON REAC 07/28/2016    CBC Lab Results  Component Value Date   WBC 4.8 02/24/2017   RBC 4.29 02/24/2017   HGB 14.0 02/24/2017   HCT 40.0 02/24/2017   PLT 287 02/24/2017   MCV 93.2 02/24/2017   MCH 32.6 02/24/2017   MCHC 35.0 02/24/2017   RDW 15.2 (H) 02/24/2017   LYMPHSABS 2,419 02/24/2017   MONOABS 0.8 06/27/2016   EOSABS 168 02/24/2017    BMET Lab Results  Component Value Date   NA 139 02/24/2017  K 4.0 02/24/2017   CL 107 02/24/2017   CO2 25 02/24/2017   GLUCOSE 95 02/24/2017   BUN 15 02/24/2017   CREATININE 1.06 02/24/2017   CALCIUM 9.3 02/24/2017   GFRNONAA 73 02/24/2017   GFRAA 85 02/24/2017      Assessment and Plan  hiv disease = detectable VL! Missing several doses, possibly off for the past few weeks as he has disclosed to pharmacist, possilby in setting on depression. Will get genotype. For now, continue with triumeq  Adherence = counseling given on the importance to restart  Depression = will see janet tomorrow  htn = will add back lisinopril 10mg   Health maintenance = will give pneumococcal vaccine

## 2017-03-10 NOTE — Progress Notes (Signed)
[facility] Pharmacy Visit  HPI: Jamie Clark is a 66 y.o. male who presents to the [facility] clinic today to follow-up with Dr. [name] for his HIV infection.  Patient Active Problem List   Diagnosis Date Noted  . Acute pain of left foot 04/30/2014  . Hyperglycemia 04/30/2014  . Cellulitis and abscess of left foot 04/30/2014  . Acute renal failure (HCC) 04/30/2014  . Tinea pedis of left foot 10/23/2013  . Phlebitis 04/01/2011    Class: History of  . INSOMNIA 04/14/2010  . Dyslipidemia 01/17/2009  . HTN (hypertension) 01/17/2009  . HIV infection (HCC) 12/18/2008  . Chronic viral hepatitis C (HCC) 07/13/2007      Medication List        Accurate as of 03/10/17  3:59 PM. Always use your most recent med list.          abacavir-dolutegravir-lamiVUDine 600-50-300 MG tablet Commonly known as:  TRIUMEQ Take 1 tablet by mouth daily.   hydrochlorothiazide 25 MG tablet Commonly known as:  HYDRODIURIL Take 1 tablet (25 mg total) by mouth daily.   lisinopril 10 MG tablet Commonly known as:  PRINIVIL,ZESTRIL Take 1 tablet (10 mg total) by mouth daily.   MULTIVITAMIN MEN 50+ Tabs Take 1 tablet by mouth daily.   ondansetron 4 MG disintegrating tablet Commonly known as:  ZOFRAN-ODT Take 1 tablet (4 mg total) by mouth every 8 (eight) hours as needed for nausea or vomiting.       Where to Get Your Medications    [facility] [address]  [address] [place] [address]   Phone:  [phone]   abacavir-dolutegravir-lamiVUDine 600-50-300 MG tablet  hydrochlorothiazide 25 MG tablet  lisinopril 10 MG tablet  MULTIVITAMIN MEN 50+ Tabs     Allergies: No Known Allergies  Past Medical History: Past Medical History:  Diagnosis Date  . Diabetes mellitus without complication (HCC)   . Hepatitis C, chronic (HCC)   . HIV positive (HCC)   . Hypertension     Social  History: Social History   Socioeconomic History  . Marital status: Divorced    Spouse name: None  . Number of children: None  . Years of education: None  . Highest education level: None  Social Needs  . Financial resource strain: None  . Food insecurity - worry: None  . Food insecurity - inability: None  . Transportation needs - medical: None  . Transportation needs - non-medical: None  Occupational History  . None  Tobacco Use  . Smoking status: Light Tobacco Smoker    Packs/day: 0.15    Types: Cigarettes  . Smokeless tobacco: Never Used  . Tobacco comment: 1 a day  Substance and Sexual Activity  . Alcohol use: No    Alcohol/week: 0.0 oz  . Drug use: No  . Sexual activity: No    Partners: Female    Comment: given condms  Other Topics Concern  . None  Social History Narrative  . None    Labs: HIV 1 RNA Quant (copies/mL)  Date Value  02/24/2017 1,520 (H)  05/27/2016 37 (H)  12/12/2015 <20   HIV-1 RNA Viral Load (no units)  Date Value  07/18/2013 <50  05/23/2013 <50  04/25/2013 <50   CD4 (no units)  Date Value  07/18/2013 927  05/23/2013 991  04/25/2013 837   CD4 T Cell Abs (/uL)  Date Value  02/24/2017 950  05/27/2016  1,080  12/12/2015 810   Hep B S Ab (no units)  Date Value  05/06/2010 NEG   Hepatitis B Surface Ag (no units)  Date Value  05/06/2010 NEGATIVE   HCV Ab (no units)  Date Value  01/01/2009 REACTIVE (A)    Lipids:    Component Value Date/Time   CHOL 215 (H) 06/11/2015 1135   CHOL 170 03/30/2013   TRIG 183 (H) 06/11/2015 1135   TRIG 148 03/30/2013   HDL 29 (L) 06/11/2015 1135   CHOLHDL 7.4 (H) 06/11/2015 1135   VLDL 37 (H) 06/11/2015 1135   LDLCALC 149 (H) 06/11/2015 1135   LDLCALC 93 10/12/2012    Current HIV Regimen: Triumeq  Assessment: Jamie Clark is here today to follow-up with Dr. [name] for his HIV infection. He is on Triumeq.  He has had detectable viral loads the last couple of times we have checked - 37 on  05/27/16 and 1,520 on 02/24/17. He admits to me that he recently lost his job and has not taken his Triumeq for at least the last month. I spent time discussing resistance with him and how it can develop when he is on and off his medications.  Dr. [name] will get a genotype today and he will come back and see me in ~3 weeks to see if we need to change his regimen.  I gave him a pill box and a pill box key chain and told him to start taking his Triumeq again.  He wants [facility] to be his primary pharmacy, so I sent the HCTZ and lisinopril there as well.   Plan: - Continue Triumeq for now - Genotype - F/u with me 04/07/17 at 3pm  Cassie L. Kuppelweiser, PharmD, AAHIVP, CPP Infectious Diseases Clinical Pharmacist [facility] 03/10/2017, 3:59 PM

## 2017-03-11 ENCOUNTER — Telehealth: Payer: Self-pay

## 2017-03-11 ENCOUNTER — Ambulatory Visit: Payer: Medicare HMO

## 2017-03-11 DIAGNOSIS — F331 Major depressive disorder, recurrent, moderate: Secondary | ICD-10-CM | POA: Diagnosis not present

## 2017-03-11 DIAGNOSIS — R69 Illness, unspecified: Secondary | ICD-10-CM | POA: Diagnosis not present

## 2017-03-11 NOTE — Telephone Encounter (Signed)
Dr. [name], Jamie Clark is requesting medication for sleep.  He forgot to ask you about this yesterday.  He will be seeing me for therapy 1x weekly for depression and anxiety. Birdie RiddleJanet L Trulee Hamstra, LCSW

## 2017-03-12 NOTE — Telephone Encounter (Signed)
Thank you janet for seeing him. I am glad that he is continuing to see you.

## 2017-03-13 ENCOUNTER — Encounter (HOSPITAL_COMMUNITY): Payer: Self-pay | Admitting: *Deleted

## 2017-03-13 ENCOUNTER — Other Ambulatory Visit: Payer: Self-pay

## 2017-03-13 ENCOUNTER — Emergency Department (HOSPITAL_COMMUNITY): Payer: Medicare HMO

## 2017-03-13 ENCOUNTER — Emergency Department (HOSPITAL_COMMUNITY)
Admission: EM | Admit: 2017-03-13 | Discharge: 2017-03-13 | Disposition: A | Discharge status: Home / Self Care | Payer: Medicare HMO | Attending: Emergency Medicine | Admitting: Emergency Medicine

## 2017-03-13 DIAGNOSIS — Y929 Unspecified place or not applicable: Secondary | ICD-10-CM | POA: Diagnosis not present

## 2017-03-13 DIAGNOSIS — Y999 Unspecified external cause status: Secondary | ICD-10-CM | POA: Insufficient documentation

## 2017-03-13 DIAGNOSIS — S0083XA Contusion of other part of head, initial encounter: Secondary | ICD-10-CM | POA: Diagnosis not present

## 2017-03-13 DIAGNOSIS — R220 Localized swelling, mass and lump, head: Secondary | ICD-10-CM | POA: Diagnosis not present

## 2017-03-13 DIAGNOSIS — Y939 Activity, unspecified: Secondary | ICD-10-CM | POA: Diagnosis not present

## 2017-03-13 DIAGNOSIS — F17210 Nicotine dependence, cigarettes, uncomplicated: Secondary | ICD-10-CM | POA: Diagnosis not present

## 2017-03-13 DIAGNOSIS — E119 Type 2 diabetes mellitus without complications: Secondary | ICD-10-CM | POA: Insufficient documentation

## 2017-03-13 DIAGNOSIS — B20 Human immunodeficiency virus [HIV] disease: Secondary | ICD-10-CM | POA: Insufficient documentation

## 2017-03-13 DIAGNOSIS — I10 Essential (primary) hypertension: Secondary | ICD-10-CM | POA: Diagnosis not present

## 2017-03-13 DIAGNOSIS — S0993XA Unspecified injury of face, initial encounter: Secondary | ICD-10-CM | POA: Diagnosis not present

## 2017-03-13 DIAGNOSIS — S0990XA Unspecified injury of head, initial encounter: Secondary | ICD-10-CM | POA: Insufficient documentation

## 2017-03-13 DIAGNOSIS — R69 Illness, unspecified: Secondary | ICD-10-CM | POA: Diagnosis not present

## 2017-03-13 MED ORDER — ZOLPIDEM TARTRATE 5 MG PO TABS: 5.0000 mg | ORAL_TABLET | Freq: Every evening | ORAL | 0 refills | Status: DC | PRN

## 2017-03-13 NOTE — ED Notes (Signed)
Patient transported to CT 
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
## 2017-03-13 NOTE — Discharge Instructions (Signed)
Take acetaminophen or ibuprofen as needed or pain.
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
## 2017-03-18 ENCOUNTER — Ambulatory Visit: Payer: Medicare HMO

## 2017-03-18 DIAGNOSIS — R69 Illness, unspecified: Secondary | ICD-10-CM | POA: Diagnosis not present

## 2017-03-18 DIAGNOSIS — F331 Major depressive disorder, recurrent, moderate: Secondary | ICD-10-CM | POA: Diagnosis not present

## 2017-03-25 ENCOUNTER — Emergency Department (HOSPITAL_COMMUNITY)
Admission: EM | Admit: 2017-03-25 | Discharge: 2017-03-25 | Disposition: A | Discharge status: Home / Self Care | Payer: Medicare HMO | Attending: Emergency Medicine | Admitting: Emergency Medicine

## 2017-03-25 ENCOUNTER — Emergency Department (HOSPITAL_BASED_OUTPATIENT_CLINIC_OR_DEPARTMENT_OTHER)
Admit: 2017-03-25 | Discharge: 2017-03-25 | Disposition: A | Discharge status: Home / Self Care | Payer: Medicare HMO | Attending: Emergency Medicine | Admitting: Emergency Medicine

## 2017-03-25 ENCOUNTER — Encounter (HOSPITAL_COMMUNITY): Payer: Self-pay | Admitting: *Deleted

## 2017-03-25 ENCOUNTER — Ambulatory Visit: Payer: Medicare HMO

## 2017-03-25 ENCOUNTER — Other Ambulatory Visit: Payer: Self-pay

## 2017-03-25 ENCOUNTER — Telehealth: Payer: Self-pay

## 2017-03-25 DIAGNOSIS — Z202 Contact with and (suspected) exposure to infections with a predominantly sexual mode of transmission: Secondary | ICD-10-CM | POA: Insufficient documentation

## 2017-03-25 DIAGNOSIS — R590 Localized enlarged lymph nodes: Secondary | ICD-10-CM | POA: Diagnosis not present

## 2017-03-25 DIAGNOSIS — F17210 Nicotine dependence, cigarettes, uncomplicated: Secondary | ICD-10-CM | POA: Insufficient documentation

## 2017-03-25 DIAGNOSIS — Z79899 Other long term (current) drug therapy: Secondary | ICD-10-CM | POA: Diagnosis not present

## 2017-03-25 DIAGNOSIS — M79609 Pain in unspecified limb: Secondary | ICD-10-CM | POA: Diagnosis not present

## 2017-03-25 DIAGNOSIS — Z21 Asymptomatic human immunodeficiency virus [HIV] infection status: Secondary | ICD-10-CM | POA: Insufficient documentation

## 2017-03-25 DIAGNOSIS — E119 Type 2 diabetes mellitus without complications: Secondary | ICD-10-CM | POA: Diagnosis not present

## 2017-03-25 DIAGNOSIS — I10 Essential (primary) hypertension: Secondary | ICD-10-CM | POA: Diagnosis not present

## 2017-03-25 DIAGNOSIS — Z9189 Other specified personal risk factors, not elsewhere classified: Secondary | ICD-10-CM

## 2017-03-25 DIAGNOSIS — R69 Illness, unspecified: Secondary | ICD-10-CM | POA: Diagnosis not present

## 2017-03-25 LAB — URINALYSIS, ROUTINE W REFLEX MICROSCOPIC
BACTERIA UA: NONE SEEN
BILIRUBIN URINE: NEGATIVE
GLUCOSE, UA: NEGATIVE mg/dL
HGB URINE DIPSTICK: NEGATIVE
Ketones, ur: 5 mg/dL — AB
LEUKOCYTES UA: NEGATIVE
NITRITE: NEGATIVE
PROTEIN: 30 mg/dL — AB
SPECIFIC GRAVITY, URINE: 1.031 — AB (ref 1.005–1.030)
pH: 5 (ref 5.0–8.0)

## 2017-03-25 MED ORDER — LIDOCAINE HCL (PF) 1 % IJ SOLN
INTRAMUSCULAR | Status: AC
  Administered 2017-03-25: 5 mL
  Filled 2017-03-25: qty 5

## 2017-03-25 MED ORDER — CEFTRIAXONE SODIUM 250 MG IJ SOLR
250.0000 mg | Freq: Once | INTRAMUSCULAR | Status: AC
  Administered 2017-03-25: 250 mg via INTRAMUSCULAR
  Filled 2017-03-25: qty 250

## 2017-03-25 MED ORDER — DOXYCYCLINE HYCLATE 100 MG PO CAPS: 100.0000 mg | ORAL_CAPSULE | Freq: Two times a day (BID) | ORAL | 0 refills | Status: DC

## 2017-03-25 MED ORDER — NAPROXEN 500 MG PO TABS: 500.0000 mg | ORAL_TABLET | Freq: Two times a day (BID) | ORAL | 0 refills | Status: DC

## 2017-03-25 MED ORDER — DOXYCYCLINE HYCLATE 100 MG PO TABS
100.0000 mg | ORAL_TABLET | Freq: Once | ORAL | Status: AC
  Administered 2017-03-25: 100 mg via ORAL
  Filled 2017-03-25: qty 1

## 2017-03-25 NOTE — ED Notes (Signed)
Reviewed d/c with patient, patient verbalized understanding, no further questions

## 2017-03-25 NOTE — ED Triage Notes (Signed)
Pt reports left leg pain that started Tuesday. Pt reports redness and swelling. Pt states that he has had this in the past with infection. Pt states that the pain starts in his upper thigh and goes to his calf.

## 2017-03-25 NOTE — ED Provider Notes (Signed)
[facility] Provider Note   CSN: [id number] Arrival date & time: 03/25/17  [id number]     History   Chief Complaint Chief Complaint  Patient presents with  . Leg Pain    HPI Jamie Clark is a 67 y.o. male.  HPI States he started getting a tender swollen area in his left groin 2 days ago.  He reports that the pain radiated down the inside of his thigh towards his knee.  It actually feels a little better today.  He reports he had something similar a few months ago and got an antibiotic and it got better.  He denies any chest pain or shortness of breath.  No fever no chills.  No abdominal pain.  No pain burning urgency with urination.  Patient reports he is sexually active.  He reports his with the same partner but they have a casual relationship and she may be with other people.  He reports there have been times when they have not use protected sex.  Patient is HIV and hep C positive. Past Medical History:  Diagnosis Date  . Diabetes mellitus without complication (HCC)   . Hepatitis C, chronic (HCC)   . HIV positive (HCC)   . Hypertension     Patient Active Problem List   Diagnosis Date Noted  . Acute pain of left foot 04/30/2014  . Hyperglycemia 04/30/2014  . Cellulitis and abscess of left foot 04/30/2014  . Acute renal failure (HCC) 04/30/2014  . Tinea pedis of left foot 10/23/2013  . Phlebitis 04/01/2011    Class: History of  . INSOMNIA 04/14/2010  . Dyslipidemia 01/17/2009  . HTN (hypertension) 01/17/2009  . HIV infection (HCC) 12/18/2008  . Chronic viral hepatitis C (HCC) 07/13/2007    History reviewed. No pertinent surgical history.     Home Medications    Prior to Admission medications   Medication Sig Start Date End Date Taking? Authorizing Provider  abacavir-dolutegravir-lamiVUDine (TRIUMEQ) 600-50-300 MG tablet Take 1 tablet by mouth daily. 03/10/17  Yes Judyann MunsonSnider, Cynthia, MD  ENSURE (ENSURE) Take 237 mLs by mouth 2  (two) times daily between meals. 03/10/17  Yes Judyann MunsonSnider, Cynthia, MD  hydrochlorothiazide (HYDRODIURIL) 25 MG tablet Take 1 tablet (25 mg total) by mouth daily. 03/10/17  Yes Judyann MunsonSnider, Cynthia, MD  lisinopril (PRINIVIL,ZESTRIL) 10 MG tablet Take 1 tablet (10 mg total) by mouth daily. 03/10/17  Yes Judyann MunsonSnider, Cynthia, MD  tetrahydrozoline 0.05 % ophthalmic solution Place 1 drop into both eyes daily as needed (redness).   Yes [provider]  zolpidem (AMBIEN) 5 MG tablet Take 1 tablet (5 mg total) by mouth at bedtime as needed for sleep. 03/13/17  Yes Dione BoozeGlick, David, MD  doxycycline (VIBRAMYCIN) 100 MG capsule Take 1 capsule (100 mg total) by mouth 2 (two) times daily. One po bid x 7 days 03/25/17   Arby BarrettePfeiffer, Crystalann Korf, MD  Multiple Vitamins-Minerals (MULTIVITAMIN MEN 50+) TABS Take 1 tablet by mouth daily. Patient not taking: Reported on 03/25/2017 03/10/17   Judyann MunsonSnider, Cynthia, MD  naproxen (NAPROSYN) 500 MG tablet Take 1 tablet (500 mg total) by mouth 2 (two) times daily. 03/25/17   Arby BarrettePfeiffer, Jemima Petko, MD  ondansetron (ZOFRAN-ODT) 4 MG disintegrating tablet Take 1 tablet (4 mg total) by mouth every 8 (eight) hours as needed for nausea or vomiting. Patient not taking: Reported on 03/10/2017 03/03/17   Mardella LaymanHagler, Brian, MD    Family History Family History  Problem Relation Age of Onset  . Cancer Mother   . Hypertension  Mother   . Cancer Father   . Hypertension Father     Social History Social History   Tobacco Use  . Smoking status: Light Tobacco Smoker    Packs/day: 0.15    Types: Cigarettes  . Smokeless tobacco: Never Used  . Tobacco comment: 1 a day  Substance Use Topics  . Alcohol use: No    Alcohol/week: 0.0 oz  . Drug use: No     Allergies   Patient has no known allergies.   Review of Systems Review of Systems 10 Systems reviewed and are negative for acute change except as noted in the HPI.   Physical Exam Updated Vital Signs BP 137/89 (BP Location: Right Arm)   Pulse 63   Temp  97.6 F (36.4 C) (Oral)   Resp 20   SpO2 99%   Physical Exam  Constitutional:  Awake, alert, nontoxic appearance.  No distress.  Clinically well in appearance.  HENT:  Head: Normocephalic and atraumatic.  Mouth/Throat: No oropharyngeal exudate.  Eyes: EOM are normal. Right eye exhibits no discharge. Left eye exhibits no discharge.  Cardiovascular: Normal rate and regular rhythm.  No murmur heard. Pulmonary/Chest: Effort normal and breath sounds normal. No stridor. No respiratory distress. He has no wheezes. He has no rales.  Abdominal: Soft. Bowel sounds are normal. There is no tenderness.  Genitourinary:  Genitourinary Comments: Patient has 1 approximately 1.5 cm tender lymph node in the left groin.  This is fairly mobile.  Not matted.  He endorses some tenderness medially along the thigh.  No redness or streaking.  Musculoskeletal: Normal range of motion. He exhibits no tenderness.  Left calf is soft nontender.  No peripheral edema.  No tenderness or fullness behind the knee.  No knee effusion.  No skin redness or streaking.  Lymphadenopathy:    He has no cervical adenopathy.  Neurological:  Awake, alert, cooperative and aware of situation; motor strength bilaterally; sensation normal to light touch bilaterally; peripheral visual fields full to confrontation; no facial asymmetry; tongue midline; major cranial nerves appear intact; no pronator drift, normal finger to nose bilaterally, baseline gait without new ataxia.  Skin: Skin is warm and dry. No rash noted.  Psychiatric: He has a normal mood and affect.  Nursing note and vitals reviewed.    ED Treatments / Results  Labs (all labs ordered are listed, but only abnormal results are displayed) Labs Reviewed  RPR  URINALYSIS, ROUTINE W REFLEX MICROSCOPIC  GC/CHLAMYDIA PROBE AMP ([facility]aker) NOT AT [facility]    EKG  EKG Interpretation None       Radiology No results found.  Procedures Procedures (including critical care  time)  Medications Ordered in ED Medications  cefTRIAXone (ROCEPHIN) injection 250 mg (not administered)  doxycycline (VIBRA-TABS) tablet 100 mg (not administered)     Initial Impression / Assessment and Plan / ED Course  I have reviewed the triage vital signs and the nursing notes.  Pertinent labs & imaging results that were available during my care of the patient were reviewed by me and considered in my medical decision making (see chart for details).      Final Clinical Impressions(s) / ED Diagnoses   Final diagnoses:  Inguinal lymphadenopathy  At risk for sexually transmitted disease due to unprotected sex   DVT study is negative.  Patient is clinically well.  Positive finding is for left inguinal node.  Patient reports having gotten better after taking antibiotics for something similar in the past.  She does describe  possibility of STD.  He is HIV and hep C positive.  No fevers no malaise clinically well in appearance.  Will empirically treat with Rocephin IM and doxycycline.  Patient is counseled to call his infectious disease doctor, Dr. Ilsa Iha, to schedule follow-up within a week. ED Discharge Orders        Ordered    doxycycline (VIBRAMYCIN) 100 MG capsule  2 times daily     03/25/17 1304    naproxen (NAPROSYN) 500 MG tablet  2 times daily     03/25/17 1305       Arby Barrette, MD 03/25/17 1314

## 2017-03-25 NOTE — Progress Notes (Signed)
*  PRELIMINARY RESULTS* Vascular Ultrasound Left lower extremity venous duplex has been completed.  Preliminary findings: No evidence of deep vein thrombosis in the left lower extremity.  Prominent, palpable, 2.3cm lymph node noted in left groin and small bakers cyst behind left knee.    Jamie FischerCharlotte C Yao Hyppolite 03/25/2017, 10:44 AM

## 2017-03-25 NOTE — ED Notes (Signed)
ED Provider at bedside. 

## 2017-03-25 NOTE — Telephone Encounter (Signed)
Hi Dr. [name], [name] is complaining of difficulty sleeping (in spite of the Ambien) and wanted me to ask you for something for anxiety.  Thank you!

## 2017-03-26 LAB — RPR: RPR Ser Ql: NONREACTIVE

## 2017-04-07 ENCOUNTER — Ambulatory Visit: Payer: Medicare HMO

## 2017-04-08 ENCOUNTER — Ambulatory Visit: Payer: Medicare HMO

## 2017-04-08 ENCOUNTER — Telehealth: Payer: Self-pay | Admitting: Pharmacist Clinician (PhC)/ Clinical Pharmacy Specialist

## 2017-04-08 NOTE — Telephone Encounter (Signed)
Under chart review>>media

## 2017-04-09 ENCOUNTER — Encounter: Payer: Self-pay | Admitting: *Deleted

## 2017-04-12 ENCOUNTER — Encounter: Payer: Self-pay | Admitting: Internal Medicine

## 2017-04-13 ENCOUNTER — Other Ambulatory Visit: Payer: Self-pay | Admitting: Internal Medicine

## 2017-04-13 ENCOUNTER — Other Ambulatory Visit: Payer: Medicare HMO

## 2017-04-13 DIAGNOSIS — B20 Human immunodeficiency virus [HIV] disease: Secondary | ICD-10-CM

## 2017-04-13 DIAGNOSIS — R69 Illness, unspecified: Secondary | ICD-10-CM | POA: Diagnosis not present

## 2017-04-13 MED ORDER — CLONAZEPAM 0.25 MG PO TBDP
0.2500 mg | ORAL_TABLET | Freq: Two times a day (BID) | ORAL | 0 refills | Status: DC | PRN
Start: 2017-04-13 — End: 2017-05-25

## 2017-04-13 MED ORDER — PAROXETINE HCL 20 MG PO TABS: 20.0000 mg | ORAL_TABLET | Freq: Every day | ORAL | 11 refills | Status: DC

## 2017-04-13 NOTE — Progress Notes (Signed)
Patient suffers from anxiety.  1) will start on paxil 20mg  daily 2) also bridged with klonopin of 0.25mg  BID PRN #30 NR 3) see our counselor to see how he is improving

## 2017-04-15 LAB — HIV-1 RNA QUANT-NO REFLEX-BLD
HIV 1 RNA Quant: <20 copies/mL
HIV-1 RNA Quant, Log: <1.3 Log copies/mL

## 2017-04-19 ENCOUNTER — Telehealth: Payer: Self-pay | Admitting: *Deleted

## 2017-04-19 NOTE — Telephone Encounter (Signed)
Patient is calling for results. Please advise. Andree CossHowell, Ceil Roderick M, RN

## 2017-04-20 ENCOUNTER — Telehealth: Payer: Self-pay | Admitting: Pharmacist Clinician (PhC)/ Clinical Pharmacy Specialist

## 2017-04-20 NOTE — Telephone Encounter (Signed)
Spoke to Jamie Clark - he had labs drawn last week, so he does not need to come in. He is back to being undetectable. He has another job now and has to be at work at [employer].  He did not have a f/u scheduled with Dr. [name], so I made a f/u for him in mid March.

## 2017-04-20 NOTE — Telephone Encounter (Signed)
Left him a VM to call back for an appt to repeat labs because his VL went up in Dec due to insurance issue.

## 2017-04-21 ENCOUNTER — Encounter (HOSPITAL_COMMUNITY): Payer: Self-pay | Admitting: Emergency Medicine

## 2017-04-21 ENCOUNTER — Emergency Department (HOSPITAL_COMMUNITY)
Admission: EM | Admit: 2017-04-21 | Discharge: 2017-04-22 | Disposition: A | Discharge status: Home / Self Care | Payer: Medicare HMO | Attending: Emergency Medicine | Admitting: Emergency Medicine

## 2017-04-21 DIAGNOSIS — Z79899 Other long term (current) drug therapy: Secondary | ICD-10-CM | POA: Insufficient documentation

## 2017-04-21 DIAGNOSIS — J111 Influenza due to unidentified influenza virus with other respiratory manifestations: Secondary | ICD-10-CM | POA: Insufficient documentation

## 2017-04-21 DIAGNOSIS — B353 Tinea pedis: Secondary | ICD-10-CM | POA: Diagnosis not present

## 2017-04-21 DIAGNOSIS — Z21 Asymptomatic human immunodeficiency virus [HIV] infection status: Secondary | ICD-10-CM

## 2017-04-21 DIAGNOSIS — M79605 Pain in left leg: Secondary | ICD-10-CM | POA: Diagnosis not present

## 2017-04-21 DIAGNOSIS — R69 Illness, unspecified: Secondary | ICD-10-CM | POA: Diagnosis not present

## 2017-04-21 DIAGNOSIS — E119 Type 2 diabetes mellitus without complications: Secondary | ICD-10-CM | POA: Insufficient documentation

## 2017-04-21 DIAGNOSIS — F17210 Nicotine dependence, cigarettes, uncomplicated: Secondary | ICD-10-CM | POA: Insufficient documentation

## 2017-04-21 DIAGNOSIS — B20 Human immunodeficiency virus [HIV] disease: Secondary | ICD-10-CM | POA: Insufficient documentation

## 2017-04-21 DIAGNOSIS — R05 Cough: Secondary | ICD-10-CM | POA: Diagnosis present

## 2017-04-21 LAB — CBC WITH DIFFERENTIAL/PLATELET
Basophils Absolute: 0 10*3/uL (ref 0.0–0.1)
Basophils Relative: 0 %
EOS PCT: 1 %
Eosinophils Absolute: 0 10*3/uL (ref 0.0–0.7)
HCT: 42.6 % (ref 39.0–52.0)
Hemoglobin: 14.7 g/dL (ref 13.0–17.0)
LYMPHS ABS: 1.8 10*3/uL (ref 0.7–4.0)
LYMPHS PCT: 42 %
MCH: 34 pg (ref 26.0–34.0)
MCHC: 34.5 g/dL (ref 30.0–36.0)
MCV: 98.6 fL (ref 78.0–100.0)
MONO ABS: 0.5 10*3/uL (ref 0.1–1.0)
Monocytes Relative: 12 %
Neutro Abs: 2 10*3/uL (ref 1.7–7.7)
Neutrophils Relative %: 45 %
PLATELETS: 240 10*3/uL (ref 150–400)
RBC: 4.32 MIL/uL (ref 4.22–5.81)
RDW: 13.3 % (ref 11.5–15.5)
WBC: 4.3 10*3/uL (ref 4.0–10.5)

## 2017-04-21 LAB — BASIC METABOLIC PANEL
Anion gap: 6 (ref 5–15)
BUN: 17 mg/dL (ref 6–20)
CHLORIDE: 105 mmol/L (ref 101–111)
CO2: 25 mmol/L (ref 22–32)
Calcium: 8.8 mg/dL — ABNORMAL LOW (ref 8.9–10.3)
Creatinine, Ser: 1.17 mg/dL (ref 0.61–1.24)
GFR calc Af Amer: >60 mL/min (ref >60)
GFR calc non Af Amer: >60 mL/min (ref >60)
Glucose, Bld: 119 mg/dL — ABNORMAL HIGH (ref 65–99)
POTASSIUM: 3.7 mmol/L (ref 3.5–5.1)
Sodium: 136 mmol/L (ref 135–145)

## 2017-04-21 LAB — D-DIMER, QUANTITATIVE: D-Dimer, Quant: 0.63 ug/mL-FEU — ABNORMAL HIGH (ref 0.00–0.50)

## 2017-04-21 MED ORDER — RIVAROXABAN 15 MG PO TABS
15.0000 mg | ORAL_TABLET | Freq: Once | ORAL | Status: AC
  Administered 2017-04-22: 15 mg via ORAL
  Filled 2017-04-21: qty 1

## 2017-04-21 MED ORDER — OSELTAMIVIR PHOSPHATE 75 MG PO CAPS
75.0000 mg | ORAL_CAPSULE | Freq: Once | ORAL | Status: AC
  Administered 2017-04-21: 75 mg via ORAL
  Filled 2017-04-21: qty 1

## 2017-04-21 MED ORDER — CLINDAMYCIN HCL 150 MG PO CAPS
150.0000 mg | ORAL_CAPSULE | Freq: Once | ORAL | Status: AC
  Administered 2017-04-22: 150 mg via ORAL
  Filled 2017-04-21: qty 1

## 2017-04-21 NOTE — ED Provider Notes (Addendum)
[facility]-EMERGENCY DEPT Provider Note   CSN: [id number] Arrival date & time: 04/21/17  1548     History   Chief Complaint Chief Complaint  Patient presents with  . Cough  . Groin Pain    HPI Jamie Clark is a 67 y.o. male.  The history is provided by the patient.  He has history of hypertension, diabetes, HIV positive and comes in with 2-day history of subjective fever and chills.  There is been a nonproductive cough.  Over the last 24-48 hours, he is also noticed pain and redness in his left leg.  He had been seen earlier in the month for something similar and was given some antibiotics and symptoms improved.  He states his fever had broken today.  He did receive the influenza immunization this season.  He is also noted some malodorous drainage from his feet and it has been painful for him to walk.  Past Medical History:  Diagnosis Date  . Diabetes mellitus without complication (HCC)   . Hepatitis C, chronic (HCC)   . HIV positive (HCC)   . Hypertension     Patient Active Problem List   Diagnosis Date Noted  . Acute pain of left foot 04/30/2014  . Hyperglycemia 04/30/2014  . Cellulitis and abscess of left foot 04/30/2014  . Acute renal failure (HCC) 04/30/2014  . Tinea pedis of left foot 10/23/2013  . Phlebitis 04/01/2011    Class: History of  . INSOMNIA 04/14/2010  . Dyslipidemia 01/17/2009  . HTN (hypertension) 01/17/2009  . HIV infection (HCC) 12/18/2008  . Chronic viral hepatitis C (HCC) 07/13/2007    History reviewed. No pertinent surgical history.     Home Medications    Prior to Admission medications   Medication Sig Start Date End Date Taking? Authorizing Provider  abacavir-dolutegravir-lamiVUDine (TRIUMEQ) 600-50-300 MG tablet Take 1 tablet by mouth daily. 03/10/17  Yes Judyann MunsonSnider, Cynthia, MD  clonazePAM (KLONOPIN) 0.25 MG disintegrating tablet Take 1 tablet (0.25 mg total) by mouth 2 (two) times daily as needed for seizure.  If needed for anxiety attack. 04/13/17  Yes Judyann MunsonSnider, Cynthia, MD  ENSURE (ENSURE) Take 237 mLs by mouth 2 (two) times daily between meals. 03/10/17  Yes Judyann MunsonSnider, Cynthia, MD  hydrochlorothiazide (HYDRODIURIL) 25 MG tablet Take 1 tablet (25 mg total) by mouth daily. 03/10/17  Yes Judyann MunsonSnider, Cynthia, MD  lisinopril (PRINIVIL,ZESTRIL) 10 MG tablet Take 1 tablet (10 mg total) by mouth daily. 03/10/17  Yes Judyann MunsonSnider, Cynthia, MD  naproxen (NAPROSYN) 500 MG tablet Take 1 tablet (500 mg total) by mouth 2 (two) times daily. 03/25/17  Yes Arby BarrettePfeiffer, Marcy, MD  PARoxetine (PAXIL) 20 MG tablet Take 1 tablet (20 mg total) by mouth daily. Take 1/2 tab for 6 days, then increase to 1 tab daily 04/13/17  Yes Judyann MunsonSnider, Cynthia, MD  tetrahydrozoline 0.05 % ophthalmic solution Place 1 drop into both eyes daily as needed (redness).   Yes [provider]  zolpidem (AMBIEN) 5 MG tablet Take 1 tablet (5 mg total) by mouth at bedtime as needed for sleep. 03/13/17  Yes Dione BoozeGlick, Chevelle Coulson, MD  doxycycline (VIBRAMYCIN) 100 MG capsule Take 1 capsule (100 mg total) by mouth 2 (two) times daily. One po bid x 7 days Patient not taking: Reported on 04/21/2017 03/25/17   Arby BarrettePfeiffer, Marcy, MD  Multiple Vitamins-Minerals (MULTIVITAMIN MEN 50+) TABS Take 1 tablet by mouth daily. Patient not taking: Reported on 03/25/2017 03/10/17   Judyann MunsonSnider, Cynthia, MD  ondansetron (ZOFRAN-ODT) 4 MG disintegrating tablet Take 1  tablet (4 mg total) by mouth every 8 (eight) hours as needed for nausea or vomiting. Patient not taking: Reported on 03/10/2017 03/03/17   Mardella Layman, MD    Family History Family History  Problem Relation Age of Onset  . Cancer Mother   . Hypertension Mother   . Cancer Father   . Hypertension Father     Social History Social History   Tobacco Use  . Smoking status: Light Tobacco Smoker    Packs/day: 0.15    Types: Cigarettes  . Smokeless tobacco: Never Used  . Tobacco comment: 1 a day  Substance Use Topics  . Alcohol use: No      Alcohol/week: 0.0 oz  . Drug use: No     Allergies   Patient has no known allergies.   Review of Systems Review of Systems  All other systems reviewed and are negative.    Physical Exam Updated Vital Signs BP 135/83 (BP Location: Left Arm)   Pulse 78   Temp 98.2 F (36.8 C) (Oral)   Resp 18   Ht 5\' 7"  (1.702 m)   Wt 77.1 kg (170 lb)   SpO2 96%   BMI 26.63 kg/m   Physical Exam  Nursing note and vitals reviewed.  67 year old male, resting comfortably and in no acute distress. Vital signs are normal. Oxygen saturation is 96%, which is normal. Head is normocephalic and atraumatic. PERRLA, EOMI. Oropharynx is clear. Neck is nontender and supple without adenopathy or JVD. Back is nontender and there is no CVA tenderness. Lungs are clear without rales, wheezes, or rhonchi. Chest is nontender. Heart has regular rate and rhythm without murmur. Abdomen is soft, flat, nontender without masses or hepatosplenomegaly and peristalsis is normoactive. Genitalia: Circumcised penis.  Testes descended without masses.  No inguinal adenopathy. Extremities have no cyanosis or edema, full range of motion is present.  There is erythema over the medial aspect of the right thigh and calf.  There is a palpable cord in the center of the area of erythema, and this is markedly tender.  There is evidence of tinea pedis in the intertriginous areas of the toes of the left foot. Skin is warm and dry without rash. Neurologic: Mental status is normal, cranial nerves are intact, there are no motor or sensory deficits.  ED Treatments / Results  Labs (all labs ordered are listed, but only abnormal results are displayed) Labs Reviewed  BASIC METABOLIC PANEL - Abnormal; Notable for the following components:      Result Value   Glucose, Bld 119 (*)    Calcium 8.8 (*)    All other components within normal limits  SEDIMENTATION RATE - Abnormal; Notable for the following components:   Sed Rate 100 (*)     All other components within normal limits  D-DIMER, QUANTITATIVE (NOT AT [facility]) - Abnormal; Notable for the following components:   D-Dimer, Quant 0.63 (*)    All other components within normal limits  CBC WITH DIFFERENTIAL/PLATELET    Procedures Procedures (including critical care time)  Medications Ordered in ED Medications  oseltamivir (TAMIFLU) capsule 75 mg (75 mg Oral Given 04/21/17 2316)  clindamycin (CLEOCIN) capsule 150 mg (150 mg Oral Given 04/22/17 0016)  Rivaroxaban (XARELTO) tablet 15 mg (15 mg Oral Given 04/22/17 0016)     Initial Impression / Assessment and Plan / ED Course  I have reviewed the triage vital signs and the nursing notes.  Pertinent labs & imaging results that were available during my  care of the patient were reviewed by me and considered in my medical decision making (see chart for details).  Painful cord in left leg.  This is concerning for DVT, but could represent lymphangitis starting from infection in his foot.  Clinical tinea pedis without evidence of cellulitis in the foot.  Recent flulike illness in patient with known to be HIV positive, will start on oseltamivir.  Old records are reviewed, and he has had several visits for inguinal adenopathy, but no inguinal adenopathy is detected today.  Visit on January 3 was treated for possible STD with ceftriaxone and doxycycline.  Lower extremity venous Doppler was negative.  He has had 5 venous Doppler studies dating back to 2013, all negative for DVT.  Will check CBC and sed rate.  He likely will need a course of outpatient antibiotics, but also likely will need DVT study in spite of prior negatives.  WBC is normal and with normal differential.  Sedimentation rate is pending.  D-dimer is mildly elevated, so he will need to have DVT study.  I have explained this to the patient.  He is discharged with prescription for clindamycin, is given initial dose of rivaroxaban, and is instructed to return in the morning for  venous Doppler.  If negative, he is to continue his course of antibiotics.  Advised use over-the-counter antifungal creams for the next 3 weeks.  Also given prescription for oseltamivir.  Sedimentation rate is come back significantly elevated at 100, which would be more consistent with cellulitis and lymphangitis.  He has been started on appropriate antibiotics.  Will still need to return in the morning for venous Doppler studies.  Final Clinical Impressions(s) / ED Diagnoses   Final diagnoses:  Pain in left leg  Influenza-like illness  HIV positive (HCC)  Tinea pedis of left foot    ED Discharge Orders        Ordered    clindamycin (CLEOCIN) 150 MG capsule  Every 6 hours     04/22/17 0022    oseltamivir (TAMIFLU) 75 MG capsule  Every 12 hours     04/22/17 0022    VAS [place] LOWER EXTREMITY VENOUS (DVT)     04/22/17 0023       Dione Booze, MD 04/22/17 Valentina Lucks    Dione Booze, MD 04/22/17 [phone]

## 2017-04-21 NOTE — ED Triage Notes (Signed)
Patient c/o cough and congestion x5 days. Also c/o left groin pain x5 days. Hx inguinal lymphadenopathy. Hx HIV.

## 2017-04-21 NOTE — Telephone Encounter (Signed)
Let him know that they are excellent! He has an undetectable viral load. To continue taking his meds

## 2017-04-22 ENCOUNTER — Ambulatory Visit (HOSPITAL_BASED_OUTPATIENT_CLINIC_OR_DEPARTMENT_OTHER)
Admission: RE | Admit: 2017-04-22 | Discharge: 2017-04-22 | Disposition: A | Discharge status: Home / Self Care | Payer: Medicare HMO | Source: Ambulatory Visit | Attending: Emergency Medicine | Admitting: Emergency Medicine

## 2017-04-22 DIAGNOSIS — M79605 Pain in left leg: Secondary | ICD-10-CM

## 2017-04-22 DIAGNOSIS — R69 Illness, unspecified: Secondary | ICD-10-CM | POA: Diagnosis not present

## 2017-04-22 DIAGNOSIS — E119 Type 2 diabetes mellitus without complications: Secondary | ICD-10-CM | POA: Diagnosis not present

## 2017-04-22 DIAGNOSIS — Z79899 Other long term (current) drug therapy: Secondary | ICD-10-CM | POA: Diagnosis not present

## 2017-04-22 DIAGNOSIS — J111 Influenza due to unidentified influenza virus with other respiratory manifestations: Secondary | ICD-10-CM | POA: Diagnosis not present

## 2017-04-22 DIAGNOSIS — M7989 Other specified soft tissue disorders: Secondary | ICD-10-CM

## 2017-04-22 DIAGNOSIS — B353 Tinea pedis: Secondary | ICD-10-CM | POA: Diagnosis not present

## 2017-04-22 DIAGNOSIS — M79609 Pain in unspecified limb: Secondary | ICD-10-CM | POA: Diagnosis not present

## 2017-04-22 LAB — SEDIMENTATION RATE: Sed Rate: 100 mm/hr — ABNORMAL HIGH (ref 0–16)

## 2017-04-22 MED ORDER — OSELTAMIVIR PHOSPHATE 75 MG PO CAPS: 75.0000 mg | ORAL_CAPSULE | Freq: Two times a day (BID) | ORAL | 0 refills | Status: DC

## 2017-04-22 MED ORDER — CLINDAMYCIN HCL 150 MG PO CAPS: 150.0000 mg | ORAL_CAPSULE | Freq: Four times a day (QID) | ORAL | 0 refills | Status: DC

## 2017-04-22 NOTE — Discharge Instructions (Signed)
Apply an antifungal cream like tolnaftate (Tinactin), miconazole (Micatin), or clotrimazole (Lotrimin) twice a day for the next three weeks, or until your toes have been back to normal for at least a week.  Return in the morning for a venous doppler to make sure you don't have a blood clot. If that test is negative, just continue the antibiotic. If it is positive, you will need to be given a prescription for a blood thinner.

## 2017-04-22 NOTE — Progress Notes (Signed)
Left lower extremity venous duplex completed. No evidence of DVT, superficial thrombosis, or Baker's cyst. No change from exam of 1/3/21019. Graybar ElectricVirginia Damiya Sandefur, RVS 04/22/2017 10:06

## 2017-05-24 DIAGNOSIS — H524 Presbyopia: Secondary | ICD-10-CM | POA: Diagnosis not present

## 2017-05-25 ENCOUNTER — Other Ambulatory Visit: Payer: Self-pay

## 2017-05-25 ENCOUNTER — Encounter (HOSPITAL_COMMUNITY): Payer: Self-pay

## 2017-05-25 ENCOUNTER — Ambulatory Visit (HOSPITAL_COMMUNITY)
Admission: EM | Admit: 2017-05-25 | Discharge: 2017-05-25 | Disposition: A | Discharge status: Home / Self Care | Payer: Medicare HMO | Attending: Family Medicine | Admitting: Family Medicine

## 2017-05-25 DIAGNOSIS — R2243 Localized swelling, mass and lump, lower limb, bilateral: Secondary | ICD-10-CM

## 2017-05-25 DIAGNOSIS — B353 Tinea pedis: Secondary | ICD-10-CM | POA: Diagnosis not present

## 2017-05-25 DIAGNOSIS — E119 Type 2 diabetes mellitus without complications: Secondary | ICD-10-CM | POA: Diagnosis not present

## 2017-05-25 DIAGNOSIS — M7989 Other specified soft tissue disorders: Secondary | ICD-10-CM

## 2017-05-25 LAB — GLUCOSE, CAPILLARY: GLUCOSE-CAPILLARY: 95 mg/dL (ref 65–99)

## 2017-05-25 MED ORDER — TERBINAFINE HCL 1 % EX CREA
1.0000 | TOPICAL_CREAM | Freq: Two times a day (BID) | CUTANEOUS | 0 refills | Status: DC
Start: 2017-05-25 — End: 2018-03-03

## 2017-05-25 MED ORDER — DOXYCYCLINE HYCLATE 100 MG PO CAPS: 100.0000 mg | ORAL_CAPSULE | Freq: Two times a day (BID) | ORAL | 0 refills | Status: AC

## 2017-05-25 MED ORDER — ALUM SULFATE-CA ACETATE EX PACK: 1.0000 | PACK | Freq: Two times a day (BID) | CUTANEOUS | 0 refills | Status: AC

## 2017-05-25 NOTE — ED Provider Notes (Signed)
[facility]    CSN: [id number] Arrival date & time: 05/25/17  1401     History   Chief Complaint Chief Complaint  Patient presents with  . Foot Swelling    HPI Jamie Clark is a 67 y.o. male.   Jamie Clark presents with complaints of left foot swelling which started approximately 1 week ago. Mild pain to dorsal foot. Without injury. States has had similar episodes in the past, more typically to lower legs, and has required antibiotics for cellulitis. Mild pain with movement. Also with itching and skin breakdown in between toes, worse to middle and 4th toe. This has been ongoing for some time. Denies cough, congestion or swelling to right foot. He states his feet are typically quite moist while at work. No fevers. Swelling improves with elevation of the foot. Has had multiple DVT rule outs in the past. History of HIV, hep c, htn and borderline DM. He does not take any medications for DM. Without history of gout.     ROS per HPI.       Past Medical History:  Diagnosis Date  . Diabetes mellitus without complication (HCC)   . Hepatitis C, chronic (HCC)   . HIV positive (HCC)   . Hypertension     Patient Active Problem List   Diagnosis Date Noted  . Acute pain of left foot 04/30/2014  . Hyperglycemia 04/30/2014  . Cellulitis and abscess of left foot 04/30/2014  . Acute renal failure (HCC) 04/30/2014  . Tinea pedis of left foot 10/23/2013  . Phlebitis 04/01/2011    Class: History of  . INSOMNIA 04/14/2010  . Dyslipidemia 01/17/2009  . HTN (hypertension) 01/17/2009  . HIV infection (HCC) 12/18/2008  . Chronic viral hepatitis C (HCC) 07/13/2007    History reviewed. No pertinent surgical history.     Home Medications    Prior to Admission medications   Medication Sig Start Date End Date Taking? Authorizing Provider  abacavir-dolutegravir-lamiVUDine (TRIUMEQ) 600-50-300 MG tablet Take 1 tablet by mouth daily. 03/10/17  Yes Judyann Munson, MD    clindamycin (CLEOCIN) 150 MG capsule Take 1 capsule (150 mg total) by mouth every 6 (six) hours. 04/22/17  Yes Dione Booze, MD  ENSURE (ENSURE) Take 237 mLs by mouth 2 (two) times daily between meals. 03/10/17  Yes Judyann Munson, MD  hydrochlorothiazide (HYDRODIURIL) 25 MG tablet Take 1 tablet (25 mg total) by mouth daily. 03/10/17  Yes Judyann Munson, MD  lisinopril (PRINIVIL,ZESTRIL) 10 MG tablet Take 1 tablet (10 mg total) by mouth daily. 03/10/17  Yes Judyann Munson, MD  aluminum sulfate-calcium acetate [facility]) packet Apply 1 packet topically 2 (two) times daily for 7 days. 05/25/17 06/01/17  Georgetta Haber, NP  doxycycline (VIBRAMYCIN) 100 MG capsule Take 1 capsule (100 mg total) by mouth 2 (two) times daily for 7 days. 05/25/17 06/01/17  Georgetta Haber, NP  terbinafine (LAMISIL) 1 % cream Apply 1 application topically 2 (two) times daily. 05/25/17   Georgetta Haber, NP    Family History Family History  Problem Relation Age of Onset  . Cancer Mother   . Hypertension Mother   . Cancer Father   . Hypertension Father     Social History Social History   Tobacco Use  . Smoking status: Light Tobacco Smoker    Packs/day: 0.15    Types: Cigarettes  . Smokeless tobacco: Never Used  . Tobacco comment: 1 a day  Substance Use Topics  . Alcohol use: No    Alcohol/week: 0.0  oz  . Drug use: No     Allergies   Patient has no known allergies.   Review of Systems Review of Systems   Physical Exam Triage Vital Signs ED Triage Vitals [05/25/17 1424]  Enc Vitals Group     BP (!) 169/82     Pulse Rate 66     Resp 18     Temp (!) 97.3 F (36.3 C)     Temp Source Oral     SpO2 96 %     Weight 180 lb (81.6 kg)     Height      Head Circumference      Peak Flow      Pain Score 6     Pain Loc      Pain Edu?      Excl. in GC?    No data found.  Updated Vital Signs BP (!) 169/82   Pulse 66   Temp (!) 97.3 F (36.3 C) (Oral)   Resp 18   Wt 180 lb (81.6 kg)   SpO2  96%   BMI 28.19 kg/m   Visual Acuity Right Eye Distance:   Left Eye Distance:   Bilateral Distance:    Right Eye Near:   Left Eye Near:    Bilateral Near:     Physical Exam  Constitutional: He is oriented to person, place, and time. He appears well-developed and well-nourished.  Cardiovascular: Normal rate and regular rhythm.  Pulmonary/Chest: Effort normal and breath sounds normal.  Musculoskeletal:       Left foot: There is tenderness, bony tenderness and swelling. There is normal range of motion, normal capillary refill, no crepitus, no deformity and no laceration.       Feet:  Left dorsal foot with swelling, redness and generalized tenderness; full ROM to ankle and toes; toes with significant breakdown interdigitally; see photos   Neurological: He is alert and oriented to person, place, and time.  Skin: Skin is warm and dry.         UC Treatments / Results  Labs (all labs ordered are listed, but only abnormal results are displayed) Labs Reviewed - No data to display  EKG  EKG Interpretation None       Radiology No results found.  Procedures Procedures (including critical care time)  Medications Ordered in UC Medications - No data to display   Initial Impression / Assessment and Plan / UC Course  I have reviewed the triage vital signs and the nursing notes.  Pertinent labs & imaging results that were available during my care of the patient were reviewed by me and considered in my medical decision making (see chart for details).     Given open skin between toes and with history of cellulitis, hiv, will provide coverage with antibiotics with concern for cellulitis at this time. Tinea treatment provided. Return precautions provided. Encouraged follow up in the next week with PCP and/or podiatry for recheck of symptoms. Patient verbalized understanding and agreeable to plan.    Final Clinical Impressions(s) / UC Diagnoses   Final diagnoses:  Tinea pedis  of left foot  Foot swelling    ED Discharge Orders        Ordered    doxycycline (VIBRAMYCIN) 100 MG capsule  2 times daily     05/25/17 1458    aluminum sulfate-calcium acetate (DOMEBORO) packet  2 times daily     05/25/17 1458    terbinafine (LAMISIL) 1 % cream  2 times daily  05/25/17 1458       Controlled Substance Prescriptions [place] Controlled Substance Registry consulted? Not Applicable   Georgetta HaberBurky, Natalie B, NP 05/25/17 [phone]

## 2017-05-25 NOTE — Discharge Instructions (Signed)
We will treat you for athletes foot as well as cellulitis (skin infection) of your left foot. Soak foot twice a day in provided solution. Change out socks regularly to keep foot dry. Please follow up with PCP and/or podiatry in the next week for recheck. If worsening pain, swelling or redness please return or go to Er.

## 2017-05-25 NOTE — ED Triage Notes (Signed)
Pt presents with swelling to left foot, denies any injury x 1 week.

## 2017-06-08 ENCOUNTER — Encounter: Payer: Self-pay | Admitting: Internal Medicine

## 2017-06-08 ENCOUNTER — Ambulatory Visit (INDEPENDENT_AMBULATORY_CARE_PROVIDER_SITE_OTHER): Payer: Medicare HMO | Admitting: Internal Medicine

## 2017-06-08 VITALS — BP 146/92 | HR 64 | Wt 169.0 lb

## 2017-06-08 DIAGNOSIS — B182 Chronic viral hepatitis C: Secondary | ICD-10-CM

## 2017-06-08 NOTE — Patient Instructions (Addendum)
Please go to a medical supply store to get compression socks to help with feet swelling  Also need to find socks that wick, decrease moisture to feet - can find at any store, like [facility].  Keep up the good work with taking your medicines daily

## 2017-06-08 NOTE — Progress Notes (Signed)
RFV: follow up for hiv disease  Patient ID: Jamie Clark, male   DOB: 05/23/1950, 67 y.o.   MRN: [id number]  HPI  Jamie Clark is a 67yo M with well controlled HIV disease-treated chronic hepatitis C, CD 4 count of 950/LV<20 (jan 2019) currently on triumeq(switched from RLG-truvada based upon striving study in 2014) he reports doing better with adherence. He also reported having depression at our last visits, which he feels is better controlled. He has had a few ed visits regarding left ower extremity swelling. Which he was treated for cellulitis with 10d course of doxy. DVT ruled out. Outpatient Encounter Medications as of 06/08/2017  Medication Sig  . abacavir-dolutegravir-lamiVUDine (TRIUMEQ) 600-50-300 MG tablet Take 1 tablet by mouth daily.  [name]. ENSURE (ENSURE) Take 237 mLs by mouth 2 (two) times daily between meals.  . hydrochlorothiazide (HYDRODIURIL) 25 MG tablet Take 1 tablet (25 mg total) by mouth daily.  [name]. lisinopril (PRINIVIL,ZESTRIL) 10 MG tablet Take 1 tablet (10 mg total) by mouth daily.  . clindamycin (CLEOCIN) 150 MG capsule Take 1 capsule (150 mg total) by mouth every 6 (six) hours. (Patient not taking: Reported on 06/08/2017)  . terbinafine (LAMISIL) 1 % cream Apply 1 application topically 2 (two) times daily. (Patient not taking: Reported on 06/08/2017)   No facility-administered encounter medications on file as of 06/08/2017.      Patient Active Problem List   Diagnosis Date Noted  . Acute pain of left foot 04/30/2014  . Hyperglycemia 04/30/2014  . Cellulitis and abscess of left foot 04/30/2014  . Acute renal failure (HCC) 04/30/2014  . Tinea pedis of left foot 10/23/2013  . Phlebitis 04/01/2011    Class: History of  . INSOMNIA 04/14/2010  . Dyslipidemia 01/17/2009  . HTN (hypertension) 01/17/2009  . HIV infection (HCC) 12/18/2008  . Chronic viral hepatitis C (HCC) 07/13/2007     Health Maintenance Due  Topic Date Due  . TETANUS/TDAP  01/22/1971  .  COLONOSCOPY  01/21/2002     Review of Systems Left leg swelling, 12 point ros is negative Physical Exam   BP (!) 146/92   Pulse 64   Wt 169 lb (76.7 kg)   BMI 26.47 kg/m   Physical Exam  Constitutional: He is oriented to person, place, and time. He appears well-developed and well-nourished. No distress.  HENT:  Mouth/Throat: Oropharynx is clear and moist. No oropharyngeal exudate.  Cardiovascular: Normal rate, regular rhythm and normal heart sounds. Exam reveals no gallop and no friction rub.  No murmur heard.  Pulmonary/Chest: Effort normal and breath sounds normal. No respiratory distress. He has no wheezes.  Abdominal: Soft. Bowel sounds are normal. He exhibits no distension. There is no tenderness.  Lymphadenopathy:  He has no cervical adenopathy.  Ext: trace edema on right leg and +1 edema on LLE Neurological: He is alert and oriented to person, place, and time.  Skin: Skin is warm and dry. No rash noted. No erythema.  Psychiatric: He has a normal mood and affect. His behavior is normal.    Lab Results  Component Value Date   CD4TCELL 34 02/24/2017   Lab Results  Component Value Date   CD4TABS 950 02/24/2017   CD4TABS 1,080 05/27/2016   CD4TABS 810 12/12/2015   Lab Results  Component Value Date   HIV1RNAQUANT <20 NOT DETECTED 04/13/2017   Lab Results  Component Value Date   HEPBSAB NEG 05/06/2010   Lab Results  Component Value Date   LABRPR Non Reactive 03/25/2017  CBC Lab Results  Component Value Date   WBC 4.3 04/21/2017   RBC 4.32 04/21/2017   HGB 14.7 04/21/2017   HCT 42.6 04/21/2017   PLT 240 04/21/2017   MCV 98.6 04/21/2017   MCH 34.0 04/21/2017   MCHC 34.5 04/21/2017   RDW 13.3 04/21/2017   LYMPHSABS 1.8 04/21/2017   MONOABS 0.5 04/21/2017   EOSABS 0.0 04/21/2017    BMET Lab Results  Component Value Date   NA 136 04/21/2017   K 3.7 04/21/2017   CL 105 04/21/2017   CO2 25 04/21/2017   GLUCOSE 119 (H) 04/21/2017   BUN 17  04/21/2017   CREATININE 1.17 04/21/2017   CALCIUM 8.8 (L) 04/21/2017   GFRNONAA >60 04/21/2017   GFRAA >60 04/21/2017      Assessment and Plan   HIV disease = well-controlled. Back to taking his medications regularly  Feet swelling = left foot swelling - he has been to the ED on 1/31 and 3/5 for similar complaints. His vascular u/s on 1/31 ruled out DVT. He needs compression socks to see if that would make any difference  Chronic hepatitis C without hepatic coma =will need u/s of liver  depression = continue with appt with janet

## 2017-06-21 ENCOUNTER — Ambulatory Visit (HOSPITAL_COMMUNITY)
Admission: RE | Admit: 2017-06-21 | Discharge: 2017-06-21 | Disposition: A | Discharge status: Home / Self Care | Payer: Medicare HMO | Source: Ambulatory Visit | Attending: Internal Medicine | Admitting: Internal Medicine

## 2017-06-21 DIAGNOSIS — K7689 Other specified diseases of liver: Secondary | ICD-10-CM | POA: Diagnosis not present

## 2017-06-21 DIAGNOSIS — B182 Chronic viral hepatitis C: Secondary | ICD-10-CM | POA: Insufficient documentation

## 2017-06-21 DIAGNOSIS — N281 Cyst of kidney, acquired: Secondary | ICD-10-CM | POA: Insufficient documentation

## 2017-07-27 ENCOUNTER — Other Ambulatory Visit: Payer: Self-pay | Admitting: Internal Medicine

## 2017-07-27 DIAGNOSIS — B20 Human immunodeficiency virus [HIV] disease: Secondary | ICD-10-CM

## 2017-09-01 ENCOUNTER — Telehealth: Payer: Self-pay | Admitting: *Deleted

## 2017-09-01 NOTE — Telephone Encounter (Addendum)
Weight /BMI 06/08/2017  WEIGHT 169 lb  HEIGHT   BMI 26.47 kg/m2   Patient will need a diagnosis related to malnutrition or weight loss in order to continue on Ensure via THP. Please advise. Andree CossHowell, Danniel Tones M, RN

## 2017-09-06 ENCOUNTER — Other Ambulatory Visit: Payer: Self-pay | Admitting: *Deleted

## 2017-09-06 DIAGNOSIS — B20 Human immunodeficiency virus [HIV] disease: Secondary | ICD-10-CM

## 2017-09-06 DIAGNOSIS — Z8659 Personal history of other mental and behavioral disorders: Secondary | ICD-10-CM

## 2017-09-08 ENCOUNTER — Other Ambulatory Visit: Payer: Medicare HMO

## 2017-09-08 DIAGNOSIS — R69 Illness, unspecified: Secondary | ICD-10-CM | POA: Diagnosis not present

## 2017-09-08 DIAGNOSIS — Z8659 Personal history of other mental and behavioral disorders: Secondary | ICD-10-CM

## 2017-09-08 DIAGNOSIS — B20 Human immunodeficiency virus [HIV] disease: Secondary | ICD-10-CM

## 2017-09-09 LAB — T-HELPER CELL (CD4) - (RCID CLINIC ONLY)
CD4 % Helper T Cell: 40 % (ref 33–55)
CD4 T Cell Abs: 800 /uL (ref 400–2700)

## 2017-09-12 ENCOUNTER — Inpatient Hospital Stay (HOSPITAL_COMMUNITY): Payer: Medicare HMO

## 2017-09-12 ENCOUNTER — Encounter (HOSPITAL_COMMUNITY): Payer: Self-pay | Admitting: Emergency Medicine

## 2017-09-12 ENCOUNTER — Emergency Department (HOSPITAL_COMMUNITY): Payer: Medicare HMO

## 2017-09-12 ENCOUNTER — Inpatient Hospital Stay (HOSPITAL_COMMUNITY)
Admission: EM | Admit: 2017-09-12 | Discharge: 2017-09-16 | DRG: 975 | Disposition: A | Discharge status: Home / Self Care | Payer: Medicare HMO | Attending: Family Medicine | Admitting: Family Medicine

## 2017-09-12 DIAGNOSIS — D513 Other dietary vitamin B12 deficiency anemia: Secondary | ICD-10-CM | POA: Diagnosis present

## 2017-09-12 DIAGNOSIS — E1122 Type 2 diabetes mellitus with diabetic chronic kidney disease: Secondary | ICD-10-CM | POA: Diagnosis present

## 2017-09-12 DIAGNOSIS — R69 Illness, unspecified: Secondary | ICD-10-CM | POA: Diagnosis not present

## 2017-09-12 DIAGNOSIS — I129 Hypertensive chronic kidney disease with stage 1 through stage 4 chronic kidney disease, or unspecified chronic kidney disease: Secondary | ICD-10-CM | POA: Diagnosis present

## 2017-09-12 DIAGNOSIS — F17210 Nicotine dependence, cigarettes, uncomplicated: Secondary | ICD-10-CM | POA: Diagnosis present

## 2017-09-12 DIAGNOSIS — R21 Rash and other nonspecific skin eruption: Secondary | ICD-10-CM | POA: Diagnosis not present

## 2017-09-12 DIAGNOSIS — A419 Sepsis, unspecified organism: Secondary | ICD-10-CM | POA: Diagnosis not present

## 2017-09-12 DIAGNOSIS — B20 Human immunodeficiency virus [HIV] disease: Secondary | ICD-10-CM | POA: Diagnosis present

## 2017-09-12 DIAGNOSIS — M79672 Pain in left foot: Secondary | ICD-10-CM | POA: Diagnosis not present

## 2017-09-12 DIAGNOSIS — L03314 Cellulitis of groin: Secondary | ICD-10-CM

## 2017-09-12 DIAGNOSIS — N4822 Cellulitis of corpus cavernosum and penis: Secondary | ICD-10-CM | POA: Diagnosis present

## 2017-09-12 DIAGNOSIS — S30812A Abrasion of penis, initial encounter: Secondary | ICD-10-CM | POA: Diagnosis present

## 2017-09-12 DIAGNOSIS — M79609 Pain in unspecified limb: Secondary | ICD-10-CM | POA: Diagnosis not present

## 2017-09-12 DIAGNOSIS — Z8249 Family history of ischemic heart disease and other diseases of the circulatory system: Secondary | ICD-10-CM

## 2017-09-12 DIAGNOSIS — N183 Chronic kidney disease, stage 3 (moderate): Secondary | ICD-10-CM | POA: Diagnosis present

## 2017-09-12 DIAGNOSIS — N4889 Other specified disorders of penis: Secondary | ICD-10-CM | POA: Diagnosis not present

## 2017-09-12 DIAGNOSIS — Y92009 Unspecified place in unspecified non-institutional (private) residence as the place of occurrence of the external cause: Secondary | ICD-10-CM | POA: Diagnosis not present

## 2017-09-12 DIAGNOSIS — I10 Essential (primary) hypertension: Secondary | ICD-10-CM | POA: Diagnosis present

## 2017-09-12 DIAGNOSIS — B182 Chronic viral hepatitis C: Secondary | ICD-10-CM | POA: Diagnosis present

## 2017-09-12 DIAGNOSIS — R609 Edema, unspecified: Secondary | ICD-10-CM

## 2017-09-12 DIAGNOSIS — L03116 Cellulitis of left lower limb: Secondary | ICD-10-CM | POA: Diagnosis not present

## 2017-09-12 DIAGNOSIS — X58XXXA Exposure to other specified factors, initial encounter: Secondary | ICD-10-CM | POA: Diagnosis not present

## 2017-09-12 DIAGNOSIS — N179 Acute kidney failure, unspecified: Secondary | ICD-10-CM | POA: Diagnosis not present

## 2017-09-12 DIAGNOSIS — L539 Erythematous condition, unspecified: Secondary | ICD-10-CM | POA: Diagnosis not present

## 2017-09-12 DIAGNOSIS — M7989 Other specified soft tissue disorders: Secondary | ICD-10-CM | POA: Diagnosis not present

## 2017-09-12 LAB — CBC WITH DIFFERENTIAL/PLATELET
Abs Immature Granulocytes: 0.2 10*3/uL — ABNORMAL HIGH (ref 0.0–0.1)
Basophils Absolute: 0 10*3/uL (ref 0.0–0.1)
Basophils Relative: 0 %
EOS ABS: 0 10*3/uL (ref 0.0–0.7)
EOS PCT: 0 %
HEMATOCRIT: 41.6 % (ref 39.0–52.0)
Hemoglobin: 13.7 g/dL (ref 13.0–17.0)
IMMATURE GRANULOCYTES: 2 %
LYMPHS ABS: 0.5 10*3/uL — AB (ref 0.7–4.0)
Lymphocytes Relative: 5 %
MCH: 34.3 pg — ABNORMAL HIGH (ref 26.0–34.0)
MCHC: 32.9 g/dL (ref 30.0–36.0)
MCV: 104 fL — ABNORMAL HIGH (ref 78.0–100.0)
Monocytes Absolute: 0.4 10*3/uL (ref 0.1–1.0)
Monocytes Relative: 5 %
NEUTROS PCT: 88 %
Neutro Abs: 7.9 10*3/uL — ABNORMAL HIGH (ref 1.7–7.7)
Platelets: 220 10*3/uL (ref 150–400)
RBC: 4 MIL/uL — AB (ref 4.22–5.81)
RDW: 13.5 % (ref 11.5–15.5)
WBC: 8.9 10*3/uL (ref 4.0–10.5)

## 2017-09-12 LAB — PROTIME-INR
INR: 1.15
PROTHROMBIN TIME: 14.6 s (ref 11.4–15.2)

## 2017-09-12 LAB — URINALYSIS, ROUTINE W REFLEX MICROSCOPIC
Bilirubin Urine: NEGATIVE
GLUCOSE, UA: NEGATIVE mg/dL
HGB URINE DIPSTICK: NEGATIVE
Ketones, ur: NEGATIVE mg/dL
Leukocytes, UA: NEGATIVE
Nitrite: NEGATIVE
PROTEIN: NEGATIVE mg/dL
Specific Gravity, Urine: 1.039 — ABNORMAL HIGH (ref 1.005–1.030)
pH: 7 (ref 5.0–8.0)

## 2017-09-12 LAB — COMPREHENSIVE METABOLIC PANEL
ALBUMIN: 3.7 g/dL (ref 3.5–5.0)
ALT: 18 U/L (ref 17–63)
AST: 26 U/L (ref 15–41)
Alkaline Phosphatase: 82 U/L (ref 38–126)
Anion gap: 9 (ref 5–15)
BILIRUBIN TOTAL: 1.2 mg/dL (ref 0.3–1.2)
BUN: 17 mg/dL (ref 6–20)
CALCIUM: 8.9 mg/dL (ref 8.9–10.3)
CO2: 22 mmol/L (ref 22–32)
Chloride: 107 mmol/L (ref 101–111)
Creatinine, Ser: 1.36 mg/dL — ABNORMAL HIGH (ref 0.61–1.24)
GFR calc Af Amer: >60 mL/min (ref >60)
GFR calc non Af Amer: 53 mL/min — ABNORMAL LOW (ref >60)
Glucose, Bld: 99 mg/dL (ref 65–99)
POTASSIUM: 3.5 mmol/L (ref 3.5–5.1)
Sodium: 138 mmol/L (ref 135–145)
Total Protein: 7.9 g/dL (ref 6.5–8.1)

## 2017-09-12 LAB — I-STAT CG4 LACTIC ACID, ED
LACTIC ACID, VENOUS: 3.82 mmol/L — AB (ref 0.5–1.9)
Lactic Acid, Venous: 1.56 mmol/L (ref 0.5–1.9)
Lactic Acid, Venous: 1.16 mmol/L (ref 0.5–1.9)

## 2017-09-12 LAB — LACTIC ACID, PLASMA: Lactic Acid, Venous: 1.2 mmol/L (ref 0.5–1.9)

## 2017-09-12 LAB — PROCALCITONIN: PROCALCITONIN: 20.96 ng/mL

## 2017-09-12 LAB — HIV-1 RNA QUANT-NO REFLEX-BLD
HIV 1 RNA Quant: <20 copies/mL
HIV-1 RNA Quant, Log: <1.3 Log copies/mL

## 2017-09-12 LAB — APTT: aPTT: 33 seconds (ref 24–36)

## 2017-09-12 MED ORDER — ONDANSETRON HCL 4 MG/2ML IJ SOLN
4.0000 mg | Freq: Four times a day (QID) | INTRAMUSCULAR | Status: DC | PRN
  Administered 2017-09-13 – 2017-09-14 (×2): 4 mg via INTRAVENOUS
  Filled 2017-09-12 (×2): qty 2

## 2017-09-12 MED ORDER — PIPERACILLIN-TAZOBACTAM 3.375 G IVPB 30 MIN
3.3750 g | Freq: Once | INTRAVENOUS | Status: AC
  Administered 2017-09-12: 3.375 g via INTRAVENOUS
  Filled 2017-09-12: qty 50

## 2017-09-12 MED ORDER — IOHEXOL 300 MG/ML  SOLN
100.0000 mL | Freq: Once | INTRAMUSCULAR | Status: AC | PRN
  Administered 2017-09-12: 100 mL via INTRAVENOUS

## 2017-09-12 MED ORDER — ENSURE ENLIVE PO LIQD
237.0000 mL | Freq: Two times a day (BID) | ORAL | Status: DC
  Administered 2017-09-15 – 2017-09-16 (×3): 237 mL via ORAL

## 2017-09-12 MED ORDER — SODIUM CHLORIDE 0.9 % IV SOLN
INTRAVENOUS | Status: DC
  Administered 2017-09-13: 02:00:00 via INTRAVENOUS

## 2017-09-12 MED ORDER — VANCOMYCIN HCL 10 G IV SOLR
1500.0000 mg | Freq: Once | INTRAVENOUS | Status: AC
  Administered 2017-09-12: 1500 mg via INTRAVENOUS
  Filled 2017-09-12: qty 1500

## 2017-09-12 MED ORDER — LISINOPRIL 10 MG PO TABS
10.0000 mg | ORAL_TABLET | Freq: Every day | ORAL | Status: DC
  Administered 2017-09-13 – 2017-09-16 (×4): 10 mg via ORAL
  Filled 2017-09-12 (×4): qty 1

## 2017-09-12 MED ORDER — PIPERACILLIN-TAZOBACTAM 3.375 G IVPB
3.3750 g | Freq: Three times a day (TID) | INTRAVENOUS | Status: DC
  Administered 2017-09-13 (×2): 3.375 g via INTRAVENOUS
  Filled 2017-09-12 (×3): qty 50

## 2017-09-12 MED ORDER — IBUPROFEN 800 MG PO TABS
800.0000 mg | ORAL_TABLET | Freq: Once | ORAL | Status: AC
Start: 2017-09-12 — End: 2017-09-12
  Administered 2017-09-12: 800 mg via ORAL
  Filled 2017-09-12: qty 1

## 2017-09-12 MED ORDER — ACETAMINOPHEN 325 MG PO TABS
650.0000 mg | ORAL_TABLET | Freq: Once | ORAL | Status: AC
  Administered 2017-09-12: 650 mg via ORAL
  Filled 2017-09-12: qty 2

## 2017-09-12 MED ORDER — ONDANSETRON HCL 4 MG PO TABS: 4.0000 mg | ORAL_TABLET | Freq: Four times a day (QID) | ORAL | Status: DC | PRN

## 2017-09-12 MED ORDER — ACETAMINOPHEN 325 MG PO TABS
650.0000 mg | ORAL_TABLET | Freq: Four times a day (QID) | ORAL | Status: DC | PRN
  Administered 2017-09-13 – 2017-09-14 (×2): 650 mg via ORAL
  Filled 2017-09-12 (×2): qty 2

## 2017-09-12 MED ORDER — VANCOMYCIN HCL IN DEXTROSE 1-5 GM/200ML-% IV SOLN: 1000.0000 mg | Freq: Once | INTRAVENOUS | Status: DC

## 2017-09-12 MED ORDER — SODIUM CHLORIDE 0.9 % IV BOLUS (SEPSIS)
1000.0000 mL | Freq: Once | INTRAVENOUS | Status: AC
  Administered 2017-09-12: 1000 mL via INTRAVENOUS

## 2017-09-12 MED ORDER — VANCOMYCIN HCL IN DEXTROSE 750-5 MG/150ML-% IV SOLN
750.0000 mg | Freq: Two times a day (BID) | INTRAVENOUS | Status: DC
  Administered 2017-09-13 – 2017-09-14 (×3): 750 mg via INTRAVENOUS
  Filled 2017-09-12 (×4): qty 150

## 2017-09-12 MED ORDER — SODIUM CHLORIDE 0.9 % IV BOLUS (SEPSIS)
500.0000 mL | Freq: Once | INTRAVENOUS | Status: AC
  Administered 2017-09-12: 500 mL via INTRAVENOUS

## 2017-09-12 MED ORDER — AZITHROMYCIN 1 G PO PACK
1.0000 g | PACK | Freq: Once | ORAL | Status: AC
  Administered 2017-09-12: 1 g via ORAL
  Filled 2017-09-12: qty 1

## 2017-09-12 MED ORDER — ABACAVIR-DOLUTEGRAVIR-LAMIVUD 600-50-300 MG PO TABS: 1.0000 | ORAL_TABLET | Freq: Every day | ORAL | Status: DC

## 2017-09-12 MED ORDER — ACETAMINOPHEN 650 MG RE SUPP: 650.0000 mg | Freq: Four times a day (QID) | RECTAL | Status: DC | PRN

## 2017-09-12 NOTE — ED Notes (Signed)
Patient transported to ct

## 2017-09-12 NOTE — ED Provider Notes (Signed)
[facility] EMERGENCY DEPARTMENT Provider Note   CSN: [id number] Arrival date & time: 09/12/17  1705     History   Chief Complaint Chief Complaint  Patient presents with  . Penis infection    HPI Jamie Clark is a 67 y.o. male hx of HIV (CD 4 normal several days ago), here presenting with penile swelling and groin pain.  Patient states that he thinks that his girlfriend accidentally scratched his pain is with her nails about 3 days ago.  He noticed a scratch in the side of the penis that became progressively more swollen and red.  He states that the redness that progressed and now his left groin is sore and he has some fevers.  Denies any vomiting or diarrhea.  He states that he is able to urinate normally.  He states that he is monogamous with his girlfriend for about a year.  He denies any history of STDs in the past.  Of note, patient has history HIV and is compliant with his meds and has normal CD4 count several days ago.   The history is provided by the patient.    Past Medical History:  Diagnosis Date  . Diabetes mellitus without complication (HCC)   . Hepatitis C, chronic (HCC)   . HIV positive (HCC)   . Hypertension     Patient Active Problem List   Diagnosis Date Noted  . Acute pain of left foot 04/30/2014  . Hyperglycemia 04/30/2014  . Cellulitis and abscess of left foot 04/30/2014  . Acute renal failure (HCC) 04/30/2014  . Tinea pedis of left foot 10/23/2013  . Phlebitis 04/01/2011    Class: History of  . INSOMNIA 04/14/2010  . Dyslipidemia 01/17/2009  . HTN (hypertension) 01/17/2009  . HIV infection (HCC) 12/18/2008  . Chronic viral hepatitis C (HCC) 07/13/2007    No past surgical history on file.      Home Medications    Prior to Admission medications   Medication Sig Start Date End Date Taking? Authorizing Provider  abacavir-dolutegravir-lamiVUDine (TRIUMEQ) 600-50-300 MG tablet Take 1 tablet by mouth daily. 03/10/17    Judyann Munson, MD  clindamycin (CLEOCIN) 150 MG capsule Take 1 capsule (150 mg total) by mouth every 6 (six) hours. Patient not taking: Reported on 06/08/2017 04/22/17   Dione Booze, MD  ENSURE (ENSURE) Take 237 mLs by mouth 2 (two) times daily between meals. 03/10/17   Judyann Munson, MD  hydrochlorothiazide (HYDRODIURIL) 25 MG tablet take 1 tablet by MOUTH ONCE daily 07/27/17   Judyann Munson, MD  lisinopril (PRINIVIL,ZESTRIL) 10 MG tablet Take 1 tablet (10 mg total) by mouth daily. 03/10/17   Judyann Munson, MD  terbinafine (LAMISIL) 1 % cream Apply 1 application topically 2 (two) times daily. Patient not taking: Reported on 06/08/2017 05/25/17   Georgetta Haber, NP  TRIUMEQ 600-50-300 MG tablet TAKE ONE TABLET BY MOUTH EVERY DAY Patient not taking: Reported on 09/12/2017 07/27/17   Judyann Munson, MD    Family History Family History  Problem Relation Age of Onset  . Cancer Mother   . Hypertension Mother   . Cancer Father   . Hypertension Father     Social History Social History   Tobacco Use  . Smoking status: Light Tobacco Smoker    Packs/day: 0.15    Types: Cigarettes  . Smokeless tobacco: Never Used  . Tobacco comment: 1 a day  Substance Use Topics  . Alcohol use: No    Alcohol/week: 0.0 oz  . Drug  use: No     Allergies   Patient has no known allergies.   Review of Systems Review of Systems  Skin: Positive for rash and wound.  All other systems reviewed and are negative.    Physical Exam Updated Vital Signs BP 138/84   Pulse (!) 110   Temp (!) 101.5 F (38.6 C)   Resp 18   SpO2 98%   Physical Exam  Constitutional: He is oriented to person, place, and time.  Uncomfortable   HENT:  Head: Normocephalic.  Mouth/Throat: Oropharynx is clear and moist.  Eyes: Pupils are equal, round, and reactive to light. Conjunctivae and EOM are normal.  Neck: Normal range of motion. Neck supple.  Cardiovascular: Normal rate, regular rhythm and normal heart sounds.    Pulmonary/Chest: Effort normal and breath sounds normal. No stridor. No respiratory distress. He has no wheezes.  Abdominal: Soft. Bowel sounds are normal. He exhibits no distension. There is no tenderness. There is no guarding.  Genitourinary:  Genitourinary Comments: Swelling L side of penis with an abrasion. There is erythema of the entire shaft of penis. Testicles nontender , + L groin lymphadenopathy   Musculoskeletal: Normal range of motion.  Redness inner part of L thigh.   Neurological: He is alert and oriented to person, place, and time.  Skin: Skin is warm. There is erythema.  Psychiatric: He has a normal mood and affect.  Nursing note and vitals reviewed.    ED Treatments / Results  Labs (all labs ordered are listed, but only abnormal results are displayed) Labs Reviewed  COMPREHENSIVE METABOLIC PANEL - Abnormal; Notable for the following components:      Result Value   Creatinine, Ser 1.36 (*)    GFR calc non Af Amer 53 (*)    All other components within normal limits  CBC WITH DIFFERENTIAL/PLATELET - Abnormal; Notable for the following components:   RBC 4.00 (*)    MCV 104.0 (*)    MCH 34.3 (*)    Neutro Abs 7.9 (*)    Lymphs Abs 0.5 (*)    Abs Immature Granulocytes 0.2 (*)    All other components within normal limits  CULTURE, BLOOD (ROUTINE X 2)  CULTURE, BLOOD (ROUTINE X 2)  URINE CULTURE  PROTIME-INR  URINALYSIS, ROUTINE W REFLEX MICROSCOPIC  I-STAT CG4 LACTIC ACID, ED  GC/CHLAMYDIA PROBE AMP ([place]) NOT [facility]    EKG None  Radiology No results found.  Procedures Procedures (including critical care time)  CRITICAL CARE Performed by: Richardean Canalavid H Renton Berkley   Total critical care time: 30 minutes  Critical care time was exclusive of separately billable procedures and treating other patients.  Critical care was necessary to treat or prevent imminent or life-threatening deterioration.  Critical care was time spent personally by me on the following  activities: development of treatment plan with patient and/or surrogate as well as nursing, discussions with consultants, evaluation of patient's response to treatment, examination of patient, obtaining history from patient or surrogate, ordering and performing treatments and interventions, ordering and review of laboratory studies, ordering and review of radiographic studies, pulse oximetry and re-evaluation of patient's condition.   Medications Ordered in ED Medications  acetaminophen (TYLENOL) tablet 650 mg (has no administration in time range)  piperacillin-tazobactam (ZOSYN) IVPB 3.375 g (has no administration in time range)  sodium chloride 0.9 % bolus 1,000 mL (has no administration in time range)    And  sodium chloride 0.9 % bolus 1,000 mL (has no administration in time  range)    And  sodium chloride 0.9 % bolus 500 mL (has no administration in time range)  vancomycin (VANCOCIN) 1,500 mg in sodium chloride 0.9 % 500 mL IVPB (has no administration in time range)  azithromycin (ZITHROMAX) powder 1 g (has no administration in time range)  piperacillin-tazobactam (ZOSYN) IVPB 3.375 g (has no administration in time range)  vancomycin (VANCOCIN) IVPB 750 mg/150 ml premix (has no administration in time range)     Initial Impression / Assessment and Plan / ED Course  I have reviewed the triage vital signs and the nursing notes.  Pertinent labs & imaging results that were available during my care of the patient were reviewed by me and considered in my medical decision making (see chart for details).     Jamie Clark is a 67 y.o. male here with redness around the penis that progressed to the groin. He is febrile and tachycardic. Code sepsis initiated. I am concerned for fournier's gangrene vs inguinal cellulitis from penile infection. Will get labs, lactate, cultures, CT pelvis with contrast. Will give vanc/zosyn. Will add zithromax as well.   9:22 PM WBC normal. Lactate 3.8. CT  ab/pel showed no obvious drainable abscess, just cellulitis. I called Dr. Berneice Heinrich from urology, who agrees with IV abx. He states that if the cellulitis worsens or if he has subcutaneous air, then call urology for formal consult. Right now, he has no subcutaneous air on exam and doesn't need emergent urology evaluation.   Final Clinical Impressions(s) / ED Diagnoses   Final diagnoses:  None    ED Discharge Orders    None       Charlynne Pander, MD 09/12/17 2126

## 2017-09-12 NOTE — ED Notes (Signed)
Writer notified EDP Yao of abnormal I stat lactic of 3.82

## 2017-09-12 NOTE — ED Triage Notes (Addendum)
Pt states "a lady scratched my penix Thursday." Febrile at triage 101.5. Tachycardic 110s. Pt also has pain down his left leg.

## 2017-09-12 NOTE — H&P (Signed)
History and Physical    Jamie Clark WUJ:[id number] DOB: May 31, 1950 DOA: 09/12/2017  PCP: Dalbert Mayotte, MD  Patient coming from: Home.  Chief Complaint: Penile pain and swelling.  HPI: Jamie Clark is a 67 y.o. male with history of HIV, hypertension presents to the ER because of worsening pain and swelling around the penile area.  Patient symptoms started 3 days ago after he had a nail injury to the area.  Patient states his girlfriend accidentally scratched the area following which his penis is gradually became swollen and painful.  Has been having subjective feeling of fever and chills but denies any discharge from the penis.  Denies any chest pain productive cough fever or chills.  ED Course: In the ER patient was febrile tachycardic had sepsis syndrome.  Lactate was elevated and was given fluid bolus for sepsis and empiric antibiotic started.  CT of the pelvis which shows features concerning for penile cellulitis with no drainable abscess.  On-call urologist Dr. Berneice Heinrich was consulted by ER physician and at this time neurologist requested IV antibiotics and if needed to reconsult them in the morning.  Review of Systems: As per HPI, rest all negative.   Past Medical History:  Diagnosis Date  . Diabetes mellitus without complication (HCC)   . Hepatitis C, chronic (HCC)   . HIV positive (HCC)   . Hypertension     History reviewed. No pertinent surgical history.   reports that he has been smoking cigarettes.  He has been smoking about 0.15 packs per day. He has never used smokeless tobacco. He reports that he does not drink alcohol or use drugs.  No Known Allergies  Family History  Problem Relation Age of Onset  . Cancer Mother   . Hypertension Mother   . Cancer Father   . Hypertension Father     Prior to Admission medications   Medication Sig Start Date End Date Taking? Authorizing Provider  abacavir-dolutegravir-lamiVUDine (TRIUMEQ) 600-50-300 MG tablet Take 1  tablet by mouth daily. 03/10/17  Yes Judyann Munson, MD  ENSURE (ENSURE) Take 237 mLs by mouth 2 (two) times daily between meals. 03/10/17  Yes Judyann Munson, MD  hydrochlorothiazide (HYDRODIURIL) 25 MG tablet take 1 tablet by MOUTH ONCE daily Patient taking differently: Take 25mg  by mouth daily 07/27/17  Yes Judyann Munson, MD  lisinopril (PRINIVIL,ZESTRIL) 10 MG tablet Take 1 tablet (10 mg total) by mouth daily. 03/10/17  Yes Judyann Munson, MD  clindamycin (CLEOCIN) 150 MG capsule Take 1 capsule (150 mg total) by mouth every 6 (six) hours. Patient not taking: Reported on 06/08/2017 04/22/17   Dione Booze, MD  terbinafine (LAMISIL) 1 % cream Apply 1 application topically 2 (two) times daily. Patient not taking: Reported on 06/08/2017 05/25/17   Georgetta Haber, NP  TRIUMEQ 600-50-300 MG tablet TAKE ONE TABLET BY MOUTH EVERY DAY Patient not taking: Reported on 09/12/2017 07/27/17   Judyann Munson, MD    Physical Exam: Vitals:   09/12/17 1716 09/12/17 1901 09/12/17 1914 09/12/17 2100  BP: 138/84 128/81  130/76  Pulse: (!) 110 (!) 112    Resp: 18 (!) 23  (!) 22  Temp: (!) 101.5 F (38.6 C)  (!) 103 F (39.4 C)   TempSrc:   Rectal   SpO2: 98% 92%        Constitutional: Moderately built and nourished. Vitals:   09/12/17 1716 09/12/17 1901 09/12/17 1914 09/12/17 2100  BP: 138/84 128/81  130/76  Pulse: (!) 110 (!) 112    Resp:  18 (!) 23  (!) 22  Temp: (!) 101.5 F (38.6 C)  (!) 103 F (39.4 C)   TempSrc:   Rectal   SpO2: 98% 92%     Eyes: Anicteric no pallor. ENMT: No discharge from the ears eyes nose or mouth. Neck: No mass palpated no neck rigidity but no JVD appreciated. Respiratory: No rhonchi or crepitations. Cardiovascular: S1-S2 heard. Abdomen: Soft nontender bowel sounds present.  Penile area has no apparent induration.  Mildly tender.  No discharge. Musculoskeletal: No edema. Skin: Penile area is mild thickening of the skin with no crepitus. Neurologic: Alert awake  oriented to time place and person.  Moves all extremities. Psychiatric: Appears normal per normal affect.   Labs on Admission: I have personally reviewed following labs and imaging studies  CBC: Recent Labs  Lab 09/08/17 0916 09/12/17 1726  WBC 3.8 8.9  NEUTROABS 1,410* 7.9*  HGB 14.0 13.7  HCT 38.9 41.6  MCV 95.1 104.0*  PLT 268 220   Basic Metabolic Panel: Recent Labs  Lab 09/08/17 0916 09/12/17 1726  NA 138 138  K 4.0 3.5  CL 108 107  CO2 22 22  GLUCOSE 100* 99  BUN 15 17  CREATININE 1.23 1.36*  CALCIUM 9.1 8.9   GFR: CrCl cannot be calculated (Unknown ideal weight.). Liver Function Tests: Recent Labs  Lab 09/08/17 0916 09/12/17 1726  AST 20 26  ALT 15 18  ALKPHOS  --  82  BILITOT 0.5 1.2  PROT 7.5 7.9  ALBUMIN  --  3.7   No results for input(s): LIPASE, AMYLASE in the last 168 hours. No results for input(s): AMMONIA in the last 168 hours. Coagulation Profile: Recent Labs  Lab 09/12/17 1726  INR 1.15   Cardiac Enzymes: No results for input(s): CKTOTAL, CKMB, CKMBINDEX, TROPONINI in the last 168 hours. BNP (last 3 results) No results for input(s): PROBNP in the last 8760 hours. HbA1C: No results for input(s): HGBA1C in the last 72 hours. CBG: No results for input(s): GLUCAP in the last 168 hours. Lipid Profile: No results for input(s): CHOL, HDL, LDLCALC, TRIG, CHOLHDL, LDLDIRECT in the last 72 hours. Thyroid Function Tests: No results for input(s): TSH, T4TOTAL, FREET4, T3FREE, THYROIDAB in the last 72 hours. Anemia Panel: No results for input(s): VITAMINB12, FOLATE, FERRITIN, TIBC, IRON, RETICCTPCT in the last 72 hours. Urine analysis:    Component Value Date/Time   COLORURINE AMBER (A) 03/25/2017 1303   APPEARANCEUR HAZY (A) 03/25/2017 1303   LABSPEC 1.031 (H) 03/25/2017 1303   PHURINE 5.0 03/25/2017 1303   GLUCOSEU NEGATIVE 03/25/2017 1303   HGBUR NEGATIVE 03/25/2017 1303   HGBUR negative 05/06/2010 1135   BILIRUBINUR NEGATIVE  03/25/2017 1303   BILIRUBINUR Negative 02/01/2013 1006   KETONESUR 5 (A) 03/25/2017 1303   PROTEINUR 30 (A) 03/25/2017 1303   UROBILINOGEN 1.0 09/16/2014 1750   NITRITE NEGATIVE 03/25/2017 1303   LEUKOCYTESUR NEGATIVE 03/25/2017 1303   Sepsis Labs: @LABRCNTIP (procalcitonin:4,lacticidven:4) )No results found for this or any previous visit (from the past 240 hour(s)).   Radiological Exams on Admission: Ct Pelvis W Contrast  Result Date: 09/12/2017 CLINICAL DATA:  Patient sustained a scratch to the penis presents with penile swelling. Enlarged nodes. EXAM: CT PELVIS WITH CONTRAST TECHNIQUE: Multidetector CT imaging of the pelvis was performed using the standard protocol following the bolus administration of intravenous contrast. CONTRAST:  100mL OMNIPAQUE IOHEXOL 300 MG/ML  SOLN COMPARISON:  06/27/2016 FINDINGS: Urinary Tract: No obstructive uropathy. The bladder is somewhat thickened in appearance, most of  which is likely due to underdistention. The possibility of cystitis or chronic bladder outlet obstruction due to enlarged prostate other possibilities. There is a small right-sided bladder diverticulum present. Bowel:  Nonobstructed, nondistended small and large bowel. Vascular/Lymphatic: Mild aortoiliac atherosclerosis. Reproductive: Small bilateral hydroceles right greater than left. No intratesticular mass. Mild cutaneous thickening of the penis may be related to mild cellulitis. No drainable fluid collections. Enlarged prostate noted impressing upon the base of the bladder measuring 5.8 x 4.5 x 6.1 cm. Other: Mildly enlarged inguinal lymph nodes, the largest on the left measuring up to 10 mm short axis. Small stable subcentimeter short axis right inguinal lymph nodes are identified the largest 6 mm short axis. No pelvic lymphadenopathy. Musculoskeletal: Mild degenerative joint space narrowing of both hips. Degenerative disc disease with vacuum disc phenomenon at L5-S1. Osteoarthritis of the  sacroiliac joints bilaterally. IMPRESSION: 1. Probable mild cellulitis of the penis without drainable fluid collections. 2. Small bilateral hydroceles, right greater than left. 3. Mildly enlarged inguinal lymph nodes left greater than right likely reactive in etiology. 4. Enlarged prostate impressing upon the base of the bladder. 5. Slightly thick-walled appearance of the urinary bladder which is likely due to underdistention and/or chronic bladder outlet obstruction from the enlarged prostate. Cystitis is not entirely excluded. Small right-sided bladder diverticulum is noted Electronically Signed   By: Tollie Eth M.D.   On: 09/12/2017 20:42     Assessment/Plan Principal Problem:   Sepsis (HCC) Active Problems:   HIV infection (HCC)   HTN (hypertension)    1. Sepsis secondary to penile cellulitis -patient placed on empiric antibiotics.  Follow cultures lactate level.  No signs of any Fournier's gangrene at this time.  Closely observe.  ER physician did discuss with Dr. Urban Gibson on-call urologist. 2. HIV last CD4 count was 800 last week with HIV RNA undetectable.  Continue antiretrovirals. 3. Hypertension -we will continue lisinopril but will hold hydrochlorothiazide since patient is receiving fluids.  4. Acute renal failure mild likely from sepsis.  May have to hold lisinopril if creatinine does not improve.  Hold hydrochlorothiazide for now.   DVT prophylaxis: SCDs in anticipation of possible procedures. Code Status: Full code. Family Communication: Discussed with patient. Disposition Plan: Home. Consults called: ER physician discussed with urologist. Admission status: Inpatient.   Eduard Clos MD [facility] Pager [phone].  If 7PM-7AM, please contact night-coverage [email or web address] Password [facility] 2  09/12/2017, 9:56 PM

## 2017-09-12 NOTE — Progress Notes (Signed)
Pharmacy Antibiotic Note  Jamie Clark is a 67 y.o. male admitted on 09/12/2017 with sepsis.  Pharmacy has been consulted for Zosyn and vancomycin dosing.  SCr 1.36, CrCl ~1960ml/min  Plan: Give vancomycin 1.5g IV x 1, then start vancomycin 750mg  IV Q12h Start Zosyn 3.375 gm IV q8h (4 hour infusion) Monitor clinical picture, renal function,VT prn F/U C&S, abx deescalation / LOT     Temp (24hrs), Avg:101.5 F (38.6 C), Min:101.5 F (38.6 C), Max:101.5 F (38.6 C)  Recent Labs  Lab 09/08/17 0916 09/12/17 1726 09/12/17 1745  WBC 3.8 8.9  --   CREATININE 1.23 1.36*  --   LATICACIDVEN  --   --  1.56    CrCl cannot be calculated (Unknown ideal weight.).    No Known Allergies   Thank you for allowing pharmacy to be a part of this patient's care.  Armandina StammerBATCHELDER,Tyia Binford J 09/12/2017 6:21 PM

## 2017-09-13 ENCOUNTER — Inpatient Hospital Stay (HOSPITAL_COMMUNITY): Payer: Medicare HMO

## 2017-09-13 ENCOUNTER — Other Ambulatory Visit: Payer: Self-pay

## 2017-09-13 DIAGNOSIS — M7989 Other specified soft tissue disorders: Secondary | ICD-10-CM

## 2017-09-13 DIAGNOSIS — M79609 Pain in unspecified limb: Secondary | ICD-10-CM

## 2017-09-13 DIAGNOSIS — N179 Acute kidney failure, unspecified: Secondary | ICD-10-CM

## 2017-09-13 LAB — ABO/RH: ABO/RH(D): B NEG

## 2017-09-13 LAB — GC/CHLAMYDIA PROBE AMP (~~LOC~~) NOT AT ARMC
Chlamydia: NEGATIVE
NEISSERIA GONORRHEA: NEGATIVE

## 2017-09-13 LAB — COMPREHENSIVE METABOLIC PANEL
ALBUMIN: 2.9 g/dL — AB (ref 3.5–5.0)
ALK PHOS: 65 U/L (ref 38–126)
ALT: 15 U/L — ABNORMAL LOW (ref 17–63)
ANION GAP: 4 — AB (ref 5–15)
AST: 22 U/L (ref 15–41)
BILIRUBIN TOTAL: 1.5 mg/dL — AB (ref 0.3–1.2)
BUN: 18 mg/dL (ref 6–20)
CO2: 23 mmol/L (ref 22–32)
Calcium: 8.2 mg/dL — ABNORMAL LOW (ref 8.9–10.3)
Chloride: 113 mmol/L — ABNORMAL HIGH (ref 101–111)
Creatinine, Ser: 1.49 mg/dL — ABNORMAL HIGH (ref 0.61–1.24)
GFR calc Af Amer: 55 mL/min — ABNORMAL LOW (ref >60)
GFR calc non Af Amer: 48 mL/min — ABNORMAL LOW (ref >60)
GLUCOSE: 85 mg/dL (ref 65–99)
POTASSIUM: 3.5 mmol/L (ref 3.5–5.1)
SODIUM: 140 mmol/L (ref 135–145)
Total Protein: 6.4 g/dL — ABNORMAL LOW (ref 6.5–8.1)

## 2017-09-13 LAB — CBC WITH DIFFERENTIAL/PLATELET
BASOS ABS: 0 10*3/uL (ref 0.0–0.1)
Basophils Relative: 0 %
EOS ABS: 0 10*3/uL (ref 0.0–0.7)
Eosinophils Relative: 0 %
HEMATOCRIT: 36.2 % — AB (ref 39.0–52.0)
HEMOGLOBIN: 12.1 g/dL — AB (ref 13.0–17.0)
LYMPHS PCT: 7 %
Lymphs Abs: 1 10*3/uL (ref 0.7–4.0)
MCH: 34.5 pg — ABNORMAL HIGH (ref 26.0–34.0)
MCHC: 33.4 g/dL (ref 30.0–36.0)
MCV: 103.1 fL — ABNORMAL HIGH (ref 78.0–100.0)
MONOS PCT: 3 %
Monocytes Absolute: 0.4 10*3/uL (ref 0.1–1.0)
NEUTROS ABS: 12.5 10*3/uL — AB (ref 1.7–7.7)
Neutrophils Relative %: 90 %
Platelets: 188 10*3/uL (ref 150–400)
RBC: 3.51 MIL/uL — AB (ref 4.22–5.81)
RDW: 13.8 % (ref 11.5–15.5)
WBC: 13.9 10*3/uL — ABNORMAL HIGH (ref 4.0–10.5)

## 2017-09-13 LAB — TYPE AND SCREEN
ABO/RH(D): B NEG
ANTIBODY SCREEN: NEGATIVE

## 2017-09-13 LAB — LACTIC ACID, PLASMA: Lactic Acid, Venous: 1 mmol/L (ref 0.5–1.9)

## 2017-09-13 MED ORDER — OXYCODONE HCL 5 MG PO TABS
5.0000 mg | ORAL_TABLET | Freq: Four times a day (QID) | ORAL | Status: DC | PRN
  Administered 2017-09-13 – 2017-09-14 (×3): 5 mg via ORAL
  Filled 2017-09-13 (×3): qty 1

## 2017-09-13 MED ORDER — TRAMADOL HCL 50 MG PO TABS
50.0000 mg | ORAL_TABLET | Freq: Four times a day (QID) | ORAL | Status: DC | PRN
  Administered 2017-09-13: 50 mg via ORAL
  Filled 2017-09-13: qty 1

## 2017-09-13 MED ORDER — SODIUM CHLORIDE 0.9 % IV SOLN
2.0000 g | INTRAVENOUS | Status: DC
  Administered 2017-09-13 – 2017-09-15 (×3): 2 g via INTRAVENOUS
  Filled 2017-09-13 (×3): qty 20

## 2017-09-13 MED ORDER — ABACAVIR-DOLUTEGRAVIR-LAMIVUD 600-50-300 MG PO TABS
1.0000 | ORAL_TABLET | Freq: Every day | ORAL | Status: DC
  Administered 2017-09-13 – 2017-09-16 (×5): 1 via ORAL
  Filled 2017-09-13 (×5): qty 1

## 2017-09-13 NOTE — Progress Notes (Signed)
[facility]. SR on telemetry. VSS. C/O pain to lower legs. NS infusing at 150cc/hr.Swelling noted to penis and both feet. Feet warm to touch. No open wounds. Gen plan of care initiated. Patient oriented to room and call system.

## 2017-09-13 NOTE — Progress Notes (Signed)
VASCULAR LAB PRELIMINARY  PRELIMINARY  PRELIMINARY  PRELIMINARY  Bilateral lower extremity venous duplex completed.    Preliminary report:  There is no DVT or SVT noted in the bilateral lower extremities.  Enlarged left inguinal lymph node.  Doshia Dalia, RVT 09/13/2017, 12:27 PM

## 2017-09-13 NOTE — Progress Notes (Signed)
Patient ID: Jamie Clark, male   DOB: 12-27-1950, 67 y.o.   MRN: [id number]  PROGRESS NOTE    Jamie Clark  NWG:[id number] DOB: 1950/12/04 DOA: 09/12/2017 PCP: Dalbert Mayotte, MD   Brief Narrative:  67 year old male with history of HIV, hypertension presented on 09/12/2017 with worsening penile pain and swelling after being accidentally scratched by his girlfriend with subsequent fever and chills.  He was also complaining of left lower extremity swelling, pain and redness.  CT of the pelvis showed features concerning for pain or cellulitis with no drainable abscess.  On-call urologist recommended IV antibiotics.   Assessment & Plan:   Principal Problem:   Sepsis (HCC) Active Problems:   HIV infection (HCC)   HTN (hypertension)  Sepsis secondary to cellulitis -Continue antibiotics.  Currently hemodynamically stable.  Follow cultures.  Discontinue IV fluids  Penile cellulitis -CT pelvis showed mild penile cellulitis without drainable abscess.  Currently on vancomycin and Zosyn.  Discontinue Zosyn.  Start Rocephin.  On-call urologist had recommended IV antibiotics on admission..  Follow cultures  Left lower extremity cellulitis -Patient apparently had recent left lower extremity trauma.  Will get x-ray of the tibia-fibula/ankle/foot.  Lower extremity duplex to rule out DVT.  Continue antibiotics. -Limb elevation. -Continue pain management  HIV -Last CD4 count last week was 800 with undetectable viral load.  Continue antiretrovirals.  Outpatient follow-up with ID  Hypertension -Blood pressure stable.  Continue lisinopril.  hydrochlorothiazide on hold.  Monitor.  Leukocytosis -Probably secondary to sepsis and cellulitis.  Continue antibiotics.  Repeat a.m. labs  Acute kidney injury -Monitor creatinine.  If creatinine worsens, hold lisinopril.  DVT prophylaxis: SCDs Code Status: Full Family Communication: None at bedside Disposition Plan: Home in 2 to 3  days  Consultants: None  Procedures: None  Antimicrobials: Vancomycin and Zosyn from 09/12/2017 onwards   Subjective: Patient seen and examined at bedside.  He complains of left lower extremity pain and swelling.  His penile pain is improving.  No overnight fever or vomiting.  Objective: Vitals:   09/12/17 2312 09/12/17 2343 09/13/17 0450 09/13/17 0742  BP:  131/72 113/77 127/74  Pulse:  100 87 89  Resp:  19 18 18   Temp: 98.5 F (36.9 C) 98.7 F (37.1 C) 98.8 F (37.1 C) 99.2 F (37.3 C)  TempSrc:  Oral Oral Oral  SpO2:  94% 98% 97%  Weight:  78.2 kg (172 lb 8 oz) 78.4 kg (172 lb 12.8 oz)   Height:  5\' 7"  (1.702 m)      Intake/Output Summary (Last 24 hours) at 09/13/2017 1201 Last data filed at 09/13/2017 1051 Gross per 24 hour  Intake 4116.51 ml  Output 700 ml  Net 3416.51 ml   Filed Weights   09/12/17 2343 09/13/17 0450  Weight: 78.2 kg (172 lb 8 oz) 78.4 kg (172 lb 12.8 oz)    Examination:  General exam: Appears calm and comfortable  Respiratory system: Bilateral decreased breath sounds at bases Cardiovascular system: S1 & S2 heard, rate controlled  gastrointestinal system: Abdomen is nondistended, soft and nontender. Normal bowel sounds heard. Central nervous system: Alert and oriented. No focal neurological deficits. Moving extremities Extremities: No cyanosis, clubbing; left lower extremity is swollen, erythematous, tender from mid shin to just below the ankle, no ulcer or drainage. Skin: No obvious identifiable rash on the penile area, mildly tender, no discernible erythema. Psychiatry: Judgement and insight appear normal. Mood & affect appropriate.     Data Reviewed: I have personally reviewed following labs and imaging  studies  CBC: Recent Labs  Lab 09/08/17 0916 09/12/17 1726 09/13/17 0426  WBC 3.8 8.9 13.9*  NEUTROABS 1,410* 7.9* 12.5*  HGB 14.0 13.7 12.1*  HCT 38.9 41.6 36.2*  MCV 95.1 104.0* 103.1*  PLT 268 220 188   Basic Metabolic  Panel: Recent Labs  Lab 09/08/17 0916 09/12/17 1726 09/13/17 0426  NA 138 138 140  K 4.0 3.5 3.5  CL 108 107 113*  CO2 22 22 23   GLUCOSE 100* 99 85  BUN 15 17 18   CREATININE 1.23 1.36* 1.49*  CALCIUM 9.1 8.9 8.2*   GFR: Estimated Creatinine Clearance: 46.2 mL/min (A) (by C-G formula based on SCr of 1.49 mg/dL (H)). Liver Function Tests: Recent Labs  Lab 09/08/17 0916 09/12/17 1726 09/13/17 0426  AST 20 26 22   ALT 15 18 15*  ALKPHOS  --  82 65  BILITOT 0.5 1.2 1.5*  PROT 7.5 7.9 6.4*  ALBUMIN  --  3.7 2.9*   No results for input(s): LIPASE, AMYLASE in the last 168 hours. No results for input(s): AMMONIA in the last 168 hours. Coagulation Profile: Recent Labs  Lab 09/12/17 1726  INR 1.15   Cardiac Enzymes: No results for input(s): CKTOTAL, CKMB, CKMBINDEX, TROPONINI in the last 168 hours. BNP (last 3 results) No results for input(s): PROBNP in the last 8760 hours. HbA1C: No results for input(s): HGBA1C in the last 72 hours. CBG: No results for input(s): GLUCAP in the last 168 hours. Lipid Profile: No results for input(s): CHOL, HDL, LDLCALC, TRIG, CHOLHDL, LDLDIRECT in the last 72 hours. Thyroid Function Tests: No results for input(s): TSH, T4TOTAL, FREET4, T3FREE, THYROIDAB in the last 72 hours. Anemia Panel: No results for input(s): VITAMINB12, FOLATE, FERRITIN, TIBC, IRON, RETICCTPCT in the last 72 hours. Sepsis Labs: Recent Labs  Lab 09/12/17 2021 09/12/17 2252 09/12/17 2300 09/13/17 0426  PROCALCITON  --   --  20.96  --   LATICACIDVEN 3.82* 1.16 1.2 1.0    No results found for this or any previous visit (from the past 240 hour(s)).       Radiology Studies: Dg Tibia/fibula Left  Result Date: 09/13/2017 CLINICAL DATA:  Left leg pain and swelling EXAM: LEFT TIBIA AND FIBULA - 2 VIEW COMPARISON:  None. FINDINGS: There is no evidence of fracture or other focal bone lesions. Mild soft tissue swelling is noted about the ankle. IMPRESSION: Mild soft  tissue swelling about the ankle similar to that seen on previous ankle films. No acute bony abnormality is noted. Electronically Signed   By: Alcide CleverMark  Lukens M.D.   On: 09/13/2017 10:46   Dg Ankle 2 Views Left  Result Date: 09/13/2017 CLINICAL DATA:  Leg pain and swelling EXAM: LEFT ANKLE - 2 VIEW COMPARISON:  11/04/2015 FINDINGS: Calcaneal spurs are noted. Mild soft tissue swelling is seen. No acute fracture or dislocation is noted. No soft tissue changes are seen. IMPRESSION: Mild soft tissue swelling without acute bony abnormality. Electronically Signed   By: Alcide CleverMark  Lukens M.D.   On: 09/13/2017 10:46   Ct Pelvis W Contrast  Result Date: 09/12/2017 CLINICAL DATA:  Patient sustained a scratch to the penis presents with penile swelling. Enlarged nodes. EXAM: CT PELVIS WITH CONTRAST TECHNIQUE: Multidetector CT imaging of the pelvis was performed using the standard protocol following the bolus administration of intravenous contrast. CONTRAST:  100mL OMNIPAQUE IOHEXOL 300 MG/ML  SOLN COMPARISON:  06/27/2016 FINDINGS: Urinary Tract: No obstructive uropathy. The bladder is somewhat thickened in appearance, most of which is likely due  to underdistention. The possibility of cystitis or chronic bladder outlet obstruction due to enlarged prostate other possibilities. There is a small right-sided bladder diverticulum present. Bowel:  Nonobstructed, nondistended small and large bowel. Vascular/Lymphatic: Mild aortoiliac atherosclerosis. Reproductive: Small bilateral hydroceles right greater than left. No intratesticular mass. Mild cutaneous thickening of the penis may be related to mild cellulitis. No drainable fluid collections. Enlarged prostate noted impressing upon the base of the bladder measuring 5.8 x 4.5 x 6.1 cm. Other: Mildly enlarged inguinal lymph nodes, the largest on the left measuring up to 10 mm short axis. Small stable subcentimeter short axis right inguinal lymph nodes are identified the largest 6 mm  short axis. No pelvic lymphadenopathy. Musculoskeletal: Mild degenerative joint space narrowing of both hips. Degenerative disc disease with vacuum disc phenomenon at L5-S1. Osteoarthritis of the sacroiliac joints bilaterally. IMPRESSION: 1. Probable mild cellulitis of the penis without drainable fluid collections. 2. Small bilateral hydroceles, right greater than left. 3. Mildly enlarged inguinal lymph nodes left greater than right likely reactive in etiology. 4. Enlarged prostate impressing upon the base of the bladder. 5. Slightly thick-walled appearance of the urinary bladder which is likely due to underdistention and/or chronic bladder outlet obstruction from the enlarged prostate. Cystitis is not entirely excluded. Small right-sided bladder diverticulum is noted Electronically Signed   By: Tollie Eth M.D.   On: 09/12/2017 20:42   Dg Chest Port 1 View  Result Date: 09/12/2017 CLINICAL DATA:  Sepsis.  Penis infection.  Fever. EXAM: PORTABLE CHEST 1 VIEW COMPARISON:  02/03/2015. FINDINGS: Borderline enlarged cardiac silhouette. Clear lungs with normal vascularity. Minimal bilateral shoulder degenerative changes. IMPRESSION: No acute abnormality. Electronically Signed   By: Beckie Salts M.D.   On: 09/12/2017 22:40   Dg Foot 2 Views Left  Result Date: 09/13/2017 CLINICAL DATA:  Pain and swelling EXAM: LEFT FOOT - 2 VIEW COMPARISON:  11/04/2015 FINDINGS: No acute fracture or dislocation is noted. Diffuse soft tissue swelling in the distal foot is noted. Calcaneal spurs are seen. IMPRESSION: Soft tissue swelling without acute bony abnormality. Electronically Signed   By: Alcide Clever M.D.   On: 09/13/2017 10:47        Scheduled Meds: . abacavir-dolutegravir-lamiVUDine  1 tablet Oral Daily  . feeding supplement (ENSURE ENLIVE)  237 mL Oral BID BM  . lisinopril  10 mg Oral Daily   Continuous Infusions: . piperacillin-tazobactam (ZOSYN)  IV 3.375 g (09/13/17 1049)  . vancomycin 750 mg (09/13/17  0709)     LOS: 1 day        Glade Lloyd, MD [facility] Pager [phone]  If 7PM-7AM, please contact night-coverage [email or web address] Password [facility] 09/13/2017, 12:01 PM

## 2017-09-14 LAB — CBC WITH DIFFERENTIAL/PLATELET
Band Neutrophils: 2 %
Basophils Absolute: 0 10*3/uL (ref 0.0–0.1)
Basophils Relative: 0 %
Blasts: 0 %
Eosinophils Absolute: 0 10*3/uL (ref 0.0–0.7)
Eosinophils Relative: 0 %
HEMATOCRIT: 34.9 % — AB (ref 39.0–52.0)
HEMOGLOBIN: 11.9 g/dL — AB (ref 13.0–17.0)
Lymphocytes Relative: 14 %
Lymphs Abs: 1.8 10*3/uL (ref 0.7–4.0)
MCH: 34.2 pg — ABNORMAL HIGH (ref 26.0–34.0)
MCHC: 34.1 g/dL (ref 30.0–36.0)
MCV: 100.3 fL — ABNORMAL HIGH (ref 78.0–100.0)
METAMYELOCYTES PCT: 0 %
MYELOCYTES: 0 %
Monocytes Absolute: 0.3 10*3/uL (ref 0.1–1.0)
Monocytes Relative: 2 %
Neutro Abs: 10.6 10*3/uL — ABNORMAL HIGH (ref 1.7–7.7)
Neutrophils Relative %: 82 %
Other: 0 %
PROMYELOCYTES RELATIVE: 0 %
Platelets: 110 10*3/uL — ABNORMAL LOW (ref 150–400)
RBC: 3.48 MIL/uL — AB (ref 4.22–5.81)
RDW: 13.6 % (ref 11.5–15.5)
WBC: 12.7 10*3/uL — AB (ref 4.0–10.5)
nRBC: 0 /100 WBC

## 2017-09-14 LAB — COMPREHENSIVE METABOLIC PANEL
ALBUMIN: 2.7 g/dL — AB (ref 3.5–5.0)
ALK PHOS: 65 U/L (ref 38–126)
ALT: 15 U/L (ref 0–44)
ANION GAP: 6 (ref 5–15)
AST: 22 U/L (ref 15–41)
BILIRUBIN TOTAL: 1.3 mg/dL — AB (ref 0.3–1.2)
BUN: 11 mg/dL (ref 8–23)
CALCIUM: 8.2 mg/dL — AB (ref 8.9–10.3)
CO2: 21 mmol/L — ABNORMAL LOW (ref 22–32)
Chloride: 108 mmol/L (ref 98–111)
Creatinine, Ser: 1.36 mg/dL — ABNORMAL HIGH (ref 0.61–1.24)
GFR calc Af Amer: >60 mL/min (ref >60)
GFR, EST NON AFRICAN AMERICAN: 53 mL/min — AB (ref >60)
GLUCOSE: 96 mg/dL (ref 70–99)
Potassium: 3.9 mmol/L (ref 3.5–5.1)
Sodium: 135 mmol/L (ref 135–145)
TOTAL PROTEIN: 6.1 g/dL — AB (ref 6.5–8.1)

## 2017-09-14 LAB — C-REACTIVE PROTEIN: CRP: 16.7 mg/dL — ABNORMAL HIGH (ref <1.0)

## 2017-09-14 LAB — URINE CULTURE: CULTURE: NO GROWTH

## 2017-09-14 LAB — MAGNESIUM: Magnesium: 1.8 mg/dL (ref 1.7–2.4)

## 2017-09-14 NOTE — Progress Notes (Signed)
PROGRESS NOTE    Jamie Clark  ZOX:[id number] DOB: 1950-07-25 DOA: 09/12/2017 PCP: Dalbert Mayotte, MD      Brief Narrative:  Mr. Calvert is a 67 y.o. M with HIV well controlled and HTN who presents with left foot swelling, penis swelling.       Assessment & Plan:  Sepsis  LLE cellulitis Lactate 3.8 on admission, resolved with fluids.  Redness, swelling pain of left lower extremity persist but are improving.  Blood cultures negative.    -Elevate leg -Continue ceftriaxone -Stop vancomycin   Penis cellulitis To me the penis appears completely normal, patient denies ever having had any problems with his penis.  ED provider notes clearly describe a "scratch" by patient's GF, swelling of the shaft in addition tot he above foot swelling.  Reportedly this was discussed with Urology by phone who recommended IV antibiotics only.  Regardless, any penis component appears to be resolved completely.  HIV -Continue abacavir-dolutegravir-lamivudine  Hypertension -Continue lisinopril  Macrocytic anemia Mild  CKD III Basleine Cr 1.2-.1.3 stable.        DVT prophylaxis: SCDs Code Status: FULl Family Communication: None present MDM and disposition Plan: The below labs and imaging reports were reviewed and summarized above.    The patient was admitted with sepsis from LLE cellulitis.  Will narrow to Ceftriaxone today.  Keep elevated.  Likely home Thursday or Friday with oral antibiotics to complete course.   Consultants:   None  Procedures:   Bialteral Duplex US  Final Interpretation: Right: There is no evidence of deep vein thrombosis in the lower extremity. Left: There is no evidence of deep vein thrombosis in the lower extremity.  *See table(s) above for measurements and observations.  Electronically signed by Waverly Ferrari MD on 09/14/2017 at 3:16:16 PM.    Antimicrobials:   Vancomycin 6/23 >> 6/25  Zosyn 6/23 >> 6/24  Ceftriaxone 6/24 >>    Culture data:   Urine culture 6/23: NGTD  Blood culture x2 6/23: NGTD      Subjective: Redness and pain persist.  Swelling noted.  No confusion, further fever, weakness.  No purulent drainage.  No weakness, vomiting.  Objective: Vitals:   09/14/17 0622 09/14/17 0741 09/14/17 1132 09/14/17 1549  BP: (!) 144/88 (!) 146/93 (!) 142/87 (!) 151/91  Pulse: 89 79 79 80  Resp: 18 16 16 16   Temp: 98.6 F (37 C) 98.8 F (37.1 C) 98.4 F (36.9 C) 98.3 F (36.8 C)  TempSrc: Oral Oral Oral Oral  SpO2: 98% 97% 96% 97%  Weight: 78.7 kg (173 lb 6.4 oz)     Height:        Intake/Output Summary (Last 24 hours) at 09/14/2017 1855 Last data filed at 09/14/2017 1000 Gross per 24 hour  Intake 1164.6 ml  Output 1070 ml  Net 94.6 ml   Filed Weights   09/12/17 2343 09/13/17 0450 09/14/17 0622  Weight: 78.2 kg (172 lb 8 oz) 78.4 kg (172 lb 12.8 oz) 78.7 kg (173 lb 6.4 oz)    Examination: General appearance: thin adult male, alert and in no acute distress.   Sitting in recliner HEENT: Anicteric, conjunctiva pink, lids and lashes normal. No nasal deformity, discharge, epistaxis.  Lips moist, teeth normal, OP moist, no oral lesions, hearing normal.   Skin: Warm and dry.   No suspicious rashes or lesions.  Rubor on left lower extremitiy.  No ulcers of the groin or penis. GU: The penis is normal, no discharge Lymph: There is an enlarged lymph  node in the left groin, 2cm, non-ulcerating Cardiac: RRR, nl S1-S2, no murmurs appreciated.  Capillary refill is brisk.  JVP normal  No LE edema.  Radia  pulses 2+ and symmetric. Respiratory: Normal respiratory rate and rhythm.  CTAB without rales or wheezes. Abdomen: Abdomen soft.  No TTP. No ascites, distension, hepatosplenomegaly.   MSK: No deformities or effusions. Neuro: Awake and alert.  EOMI, moves all extremities. Speech fluent.    Psych: Sensorium intact and responding to questions, attention normal. Affect normal.  Judgment and insight appear  normal.    Data Reviewed: I have personally reviewed following labs and imaging studies:  CBC: Recent Labs  Lab 09/08/17 0916 09/12/17 1726 09/13/17 0426 09/14/17 0626  WBC 3.8 8.9 13.9* 12.7*  NEUTROABS 1,410* 7.9* 12.5* 10.6*  HGB 14.0 13.7 12.1* 11.9*  HCT 38.9 41.6 36.2* 34.9*  MCV 95.1 104.0* 103.1* 100.3*  PLT 268 220 188 110*   Basic Metabolic Panel: Recent Labs  Lab 09/08/17 0916 09/12/17 1726 09/13/17 0426 09/14/17 0626  NA 138 138 140 135  K 4.0 3.5 3.5 3.9  CL 108 107 113* 108  CO2 22 22 23  21*  GLUCOSE 100* 99 85 96  BUN 15 17 18 11   CREATININE 1.23 1.36* 1.49* 1.36*  CALCIUM 9.1 8.9 8.2* 8.2*  MG  --   --   --  1.8   GFR: Estimated Creatinine Clearance: 50.6 mL/min (A) (by C-G formula based on SCr of 1.36 mg/dL (H)). Liver Function Tests: Recent Labs  Lab 09/08/17 0916 09/12/17 1726 09/13/17 0426 09/14/17 0626  AST 20 26 22 22   ALT 15 18 15* 15  ALKPHOS  --  82 65 65  BILITOT 0.5 1.2 1.5* 1.3*  PROT 7.5 7.9 6.4* 6.1*  ALBUMIN  --  3.7 2.9* 2.7*   No results for input(s): LIPASE, AMYLASE in the last 168 hours. No results for input(s): AMMONIA in the last 168 hours. Coagulation Profile: Recent Labs  Lab 09/12/17 1726  INR 1.15   Cardiac Enzymes: No results for input(s): CKTOTAL, CKMB, CKMBINDEX, TROPONINI in the last 168 hours. BNP (last 3 results) No results for input(s): PROBNP in the last 8760 hours. HbA1C: No results for input(s): HGBA1C in the last 72 hours. CBG: No results for input(s): GLUCAP in the last 168 hours. Lipid Profile: No results for input(s): CHOL, HDL, LDLCALC, TRIG, CHOLHDL, LDLDIRECT in the last 72 hours. Thyroid Function Tests: No results for input(s): TSH, T4TOTAL, FREET4, T3FREE, THYROIDAB in the last 72 hours. Anemia Panel: No results for input(s): VITAMINB12, FOLATE, FERRITIN, TIBC, IRON, RETICCTPCT in the last 72 hours. Urine analysis:    Component Value Date/Time   COLORURINE YELLOW 09/12/2017 2052     APPEARANCEUR CLEAR 09/12/2017 2052   LABSPEC 1.039 (H) 09/12/2017 2052   PHURINE 7.0 09/12/2017 2052   GLUCOSEU NEGATIVE 09/12/2017 2052   HGBUR NEGATIVE 09/12/2017 2052   HGBUR negative 05/06/2010 1135   BILIRUBINUR NEGATIVE 09/12/2017 2052   BILIRUBINUR Negative 02/01/2013 1006   KETONESUR NEGATIVE 09/12/2017 2052   PROTEINUR NEGATIVE 09/12/2017 2052   UROBILINOGEN 1.0 09/16/2014 1750   NITRITE NEGATIVE 09/12/2017 2052   LEUKOCYTESUR NEGATIVE 09/12/2017 2052   Sepsis Labs: @LABRCNTIP (procalcitonin:4,lacticacidven:4)  ) Recent Results (from the past 240 hour(s))  Culture, blood (Routine x 2)     Status: None (Preliminary result)   Collection Time: 09/12/17  5:26 PM  Result Value Ref Range Status   Specimen Description BLOOD LEFT HAND  Final   Special Requests   Final  BOTTLES DRAWN AEROBIC AND ANAEROBIC Blood Culture adequate volume   Culture   Final    NO GROWTH 2 DAYS Performed at [facility], [address], [place], [place] [address]    Report Status PENDING  Incomplete  Culture, blood (Routine x 2)     Status: None (Preliminary result)   Collection Time: 09/12/17  6:21 PM  Result Value Ref Range Status   Specimen Description BLOOD LEFT WRIST  Final   Special Requests   Final    BOTTLES DRAWN AEROBIC AND ANAEROBIC Blood Culture results may not be optimal due to an inadequate volume of blood received in culture bottles   Culture   Final    NO GROWTH 2 DAYS Performed at [facility], [address], [place], [place] [address]    Report Status PENDING  Incomplete  Urine culture     Status: None   Collection Time: 09/12/17 10:59 PM  Result Value Ref Range Status   Specimen Description URINE, RANDOM  Final   Special Requests NONE  Final   Culture   Final    NO GROWTH Performed at [facility], [address], [place], [place] [address]    Report Status 09/14/2017 FINAL  Final         Radiology Studies: Dg Tibia/fibula Left  Result  Date: 09/13/2017 CLINICAL DATA:  Left leg pain and swelling EXAM: LEFT TIBIA AND FIBULA - 2 VIEW COMPARISON:  None. FINDINGS: There is no evidence of fracture or other focal bone lesions. Mild soft tissue swelling is noted about the ankle. IMPRESSION: Mild soft tissue swelling about the ankle similar to that seen on previous ankle films. No acute bony abnormality is noted. Electronically Signed   By: Alcide Clever M.D.   On: 09/13/2017 10:46   Dg Ankle 2 Views Left  Result Date: 09/13/2017 CLINICAL DATA:  Leg pain and swelling EXAM: LEFT ANKLE - 2 VIEW COMPARISON:  11/04/2015 FINDINGS: Calcaneal spurs are noted. Mild soft tissue swelling is seen. No acute fracture or dislocation is noted. No soft tissue changes are seen. IMPRESSION: Mild soft tissue swelling without acute bony abnormality. Electronically Signed   By: Alcide Clever M.D.   On: 09/13/2017 10:46   Ct Pelvis W Contrast  Result Date: 09/12/2017 CLINICAL DATA:  Patient sustained a scratch to the penis presents with penile swelling. Enlarged nodes. EXAM: CT PELVIS WITH CONTRAST TECHNIQUE: Multidetector CT imaging of the pelvis was performed using the standard protocol following the bolus administration of intravenous contrast. CONTRAST:  OMNIPAQUE IOHEXOL 300 MG/ML  SOLN COMPARISON:  06/27/2016 FINDINGS: Urinary Tract: No obstructive uropathy. The bladder is somewhat thickened in appearance, most of which is likely due to underdistention. The possibility of cystitis or chronic bladder outlet obstruction due to enlarged prostate other possibilities. There is a small right-sided bladder diverticulum present. Bowel:  Nonobstructed, nondistended small and large bowel. Vascular/Lymphatic: Mild aortoiliac atherosclerosis. Reproductive: Small bilateral hydroceles right greater than left. No intratesticular mass. Mild cutaneous thickening of the penis may be related to mild cellulitis. No drainable fluid collections. Enlarged prostate noted impressing  upon the base of the bladder measuring 5.8 x 4.5 x 6.1 cm. Other: Mildly enlarged inguinal lymph nodes, the largest on the left measuring up to 10 mm short axis. Small stable subcentimeter short axis right inguinal lymph nodes are identified the largest 6 mm short axis. No pelvic lymphadenopathy. Musculoskeletal: Mild degenerative joint space narrowing of both hips. Degenerative disc disease with  vacuum disc phenomenon at L5-S1. Osteoarthritis of the sacroiliac joints bilaterally. IMPRESSION: 1. Probable mild cellulitis of the penis without drainable fluid collections. 2. Small bilateral hydroceles, right greater than left. 3. Mildly enlarged inguinal lymph nodes left greater than right likely reactive in etiology. 4. Enlarged prostate impressing upon the base of the bladder. 5. Slightly thick-walled appearance of the urinary bladder which is likely due to underdistention and/or chronic bladder outlet obstruction from the enlarged prostate. Cystitis is not entirely excluded. Small right-sided bladder diverticulum is noted Electronically Signed   By: Tollie Eth M.D.   On: 09/12/2017 20:42   Dg Chest Port 1 View  Result Date: 09/12/2017 CLINICAL DATA:  Sepsis.  Penis infection.  Fever. EXAM: PORTABLE CHEST 1 VIEW COMPARISON:  02/03/2015. FINDINGS: Borderline enlarged cardiac silhouette. Clear lungs with normal vascularity. Minimal bilateral shoulder degenerative changes. IMPRESSION: No acute abnormality. Electronically Signed   By: Beckie Salts M.D.   On: 09/12/2017 22:40   Dg Foot 2 Views Left  Result Date: 09/13/2017 CLINICAL DATA:  Pain and swelling EXAM: LEFT FOOT - 2 VIEW COMPARISON:  11/04/2015 FINDINGS: No acute fracture or dislocation is noted. Diffuse soft tissue swelling in the distal foot is noted. Calcaneal spurs are seen. IMPRESSION: Soft tissue swelling without acute bony abnormality. Electronically Signed   By: Alcide Clever M.D.   On: 09/13/2017 10:47        Scheduled Meds: .  abacavir-dolutegravir-lamiVUDine  1 tablet Oral Daily  . feeding supplement (ENSURE ENLIVE)  237 mL Oral BID BM  . lisinopril  10 mg Oral Daily   Continuous Infusions: . cefTRIAXone (ROCEPHIN)  IV Stopped (09/14/17 1500)  . vancomycin Stopped (09/14/17 0945)     LOS: 2 days    Time spent: 25 minutes    Alberteen Sam, MD [facility] 09/14/2017, 6:55 PM     Pager [phone] --- please page though AMION:  [email or web address] Password [email or web address] If 7PM-7AM, please contact night-coverage

## 2017-09-14 NOTE — Consult Note (Signed)
[facility] CM Primary Care Navigator  09/14/2017  Jamie Clark 1950-10-10 [id number]   Met withpatient at the bedside toidentify possible discharge needs. Patientreportshaving "chills, fever, swelling and pain to left leg" which resulted to thisadmission. (sepsis secondary to left leg and penile cellulitis)  Patientendorses Dr.Cynthia Snider with [facility] for [facility] ashiscurrent primary care provider but mentioned that he will be having a new primary care provider which is in the process.   [facility] [address] [facility] [place] [facility] without difficulty.  Patientreports thathehas beenmanaginghisownmedications at [employer] use of "pill box" filled everyday.  Patient verbalizedthat he was driving prior to admission,buthis son Belenda Cruise) willprovide transportation to hisdoctors' appointments after discharge, if needed.  Patientstates that he is independent with self care prior to admission. His son, niece Doroteo Bradford) and nephew Sydnee Cabal) will be able to provide assistance with his care if needed after discharge.  Anticipated plan for dischargeis home per patient.  Patientvoiced understandingto callprimarycare provider'soffice whenhereturnshome,for a post discharge follow-upvisitwithin1- 2 weeksor sooner if needs arise.Patient letter (with PCP's contact number) was provided asareminder.   Discussed with patientregarding [facility] CM services available for health managementandresourcesat homebuthe indicated having no current needs or issues for now.Patientverbalizedunderstandingof needto seekreferral from primary care provider to [facility] care management ifdeemed necessary and appropriatefor anyservicesin thefuture.  [facility] care management information was provided for futureneedsthat hemay have.  Patient however,verbally agreedand  optedforEMMIcalls tofollow-up withhisrecoveryat home.   Referral made for [facility] calls after discharge.   For additional questions please contact:  Edwena Felty A. Yuki Purves, BSN, RN-BC [facility] PRIMARY CARE Navigator Cell: [phone]

## 2017-09-15 DIAGNOSIS — A419 Sepsis, unspecified organism: Principal | ICD-10-CM

## 2017-09-15 DIAGNOSIS — L03116 Cellulitis of left lower limb: Secondary | ICD-10-CM | POA: Diagnosis present

## 2017-09-15 DIAGNOSIS — B20 Human immunodeficiency virus [HIV] disease: Secondary | ICD-10-CM

## 2017-09-15 DIAGNOSIS — I10 Essential (primary) hypertension: Secondary | ICD-10-CM

## 2017-09-15 LAB — BASIC METABOLIC PANEL
Anion gap: 5 (ref 5–15)
BUN: 10 mg/dL (ref 8–23)
CO2: 25 mmol/L (ref 22–32)
CREATININE: 1.34 mg/dL — AB (ref 0.61–1.24)
Calcium: 8.4 mg/dL — ABNORMAL LOW (ref 8.9–10.3)
Chloride: 108 mmol/L (ref 98–111)
GFR calc Af Amer: >60 mL/min (ref >60)
GFR, EST NON AFRICAN AMERICAN: 54 mL/min — AB (ref >60)
GLUCOSE: 100 mg/dL — AB (ref 70–99)
Potassium: 3.7 mmol/L (ref 3.5–5.1)
SODIUM: 138 mmol/L (ref 135–145)

## 2017-09-15 LAB — CBC
HCT: 35.2 % — ABNORMAL LOW (ref 39.0–52.0)
Hemoglobin: 11.7 g/dL — ABNORMAL LOW (ref 13.0–17.0)
MCH: 34.1 pg — ABNORMAL HIGH (ref 26.0–34.0)
MCHC: 33.2 g/dL (ref 30.0–36.0)
MCV: 102.6 fL — ABNORMAL HIGH (ref 78.0–100.0)
PLATELETS: 203 10*3/uL (ref 150–400)
RBC: 3.43 MIL/uL — AB (ref 4.22–5.81)
RDW: 13.4 % (ref 11.5–15.5)
WBC: 7.5 10*3/uL (ref 4.0–10.5)

## 2017-09-15 MED ORDER — CEPHALEXIN 500 MG PO CAPS
500.0000 mg | ORAL_CAPSULE | Freq: Two times a day (BID) | ORAL | Status: DC
  Administered 2017-09-16: 500 mg via ORAL
  Filled 2017-09-15: qty 1

## 2017-09-15 MED ORDER — LIP MEDEX EX OINT
TOPICAL_OINTMENT | CUTANEOUS | Status: DC | PRN
  Administered 2017-09-16: 09:00:00 via TOPICAL
  Filled 2017-09-15: qty 7

## 2017-09-15 MED ORDER — DOCUSATE SODIUM 100 MG PO CAPS
100.0000 mg | ORAL_CAPSULE | Freq: Two times a day (BID) | ORAL | Status: DC | PRN
Start: 2017-09-15 — End: 2017-09-16

## 2017-09-15 NOTE — Progress Notes (Signed)
PROGRESS NOTE  Jamie Clark  ZOX:[id number] DOB: 12/15/1950 DOA: 09/12/2017 PCP: Dalbert Mayotte, MD   Brief Narrative: Jamie Clark is a 67 y.o. male with a history of controlled HIV disease and HTN who presented with LLE tender swelling found to have cellulitis, started on IV antibiotics with slow improvement over the past few days.  Assessment & Plan: Principal Problem:   Sepsis (HCC) Active Problems:   HIV infection (HCC)   HTN (hypertension)  LLE cellulitis: Symptoms improving. Lactic acid elevation resolved. Leukocytosis improving. Negative radiographs of foot, ankle, tib/fib.  - Elevate, TED hose, Will transition to po keflex (500mg  po BID due to CrCl 51ml/min) and discharge tomorrow if stable.  HIV disease: CD4 count 800.  - Continue home ART   HTN:  - Continue lisinopril  History of diabetes: Last A1c 2016 was 6.1%.  - Check A1c  Macrocytic anemia: Likely due to ART. Very mild anemia.  - Check Vitamin B12.   Penis injury: Healed scratch that was present on admission. No evidence of infection at the site.   Code Status: Full Family Communication: None at bedside Disposition Plan: Home once improved, possibly 6/27.   Consultants:   None  Procedures:   None  Antimicrobials:  Vancomycin, ceftriaxone  Keflex   Subjective: Tenderness is improving, much less with any weight bearing. Left ankle tender to palpation, swelling improved but significant when compared to right. 99.61F last night.   Objective: Vitals:   09/14/17 1549 09/14/17 2053 09/15/17 0556 09/15/17 0737  BP: (!) 151/91 (!) 147/89 (!) 156/89 (!) 152/88  Pulse: 80 80 75 70  Resp: 16 18 15 18   Temp: 98.3 F (36.8 C) 98.8 F (37.1 C) 99.6 F (37.6 C) 98.8 F (37.1 C)  TempSrc: Oral Oral Oral Oral  SpO2: 97% 95% 95% 96%  Weight:   78.2 kg (172 lb 6.4 oz)   Height:        Intake/Output Summary (Last 24 hours) at 09/15/2017 1624 Last data filed at 09/15/2017 1328 Gross per 24  hour  Intake 704.78 ml  Output 300 ml  Net 404.78 ml   Filed Weights   09/13/17 0450 09/14/17 0622 09/15/17 0556  Weight: 78.4 kg (172 lb 12.8 oz) 78.7 kg (173 lb 6.4 oz) 78.2 kg (172 lb 6.4 oz)    Gen: 67 y.o. male in no distress  Pulm: Non-labored breathing. Clear to auscultation bilaterally.  CV: Regular rate and rhythm. No murmur, rub, or gallop. No JVD. GI: Abdomen soft, non-tender, non-distended, with normoactive bowel sounds. No organomegaly or masses felt. Ext: Warm, no deformities Skin: Left foot and ankle diffusely edematous, warm, tender with ill-defined erythema to lower shin. No fluctuance.  Neuro: Alert and oriented. No focal neurological deficits. Psych: Judgement and insight appear normal. Mood & affect appropriate.   Data Reviewed: I have personally reviewed following labs and imaging studies  CBC: Recent Labs  Lab 09/12/17 1726 09/13/17 0426 09/14/17 0626 09/15/17 0635  WBC 8.9 13.9* 12.7* 7.5  NEUTROABS 7.9* 12.5* 10.6*  --   HGB 13.7 12.1* 11.9* 11.7*  HCT 41.6 36.2* 34.9* 35.2*  MCV 104.0* 103.1* 100.3* 102.6*  PLT 220 188 110* 203   Basic Metabolic Panel: Recent Labs  Lab 09/12/17 1726 09/13/17 0426 09/14/17 0626 09/15/17 0635  NA 138 140 135 138  K 3.5 3.5 3.9 3.7  CL 107 113* 108 108  CO2 22 23 21* 25  GLUCOSE 99 85 96 100*  BUN 17 18 11 10   CREATININE 1.36* 1.49*  1.36* 1.34*  CALCIUM 8.9 8.2* 8.2* 8.4*  MG  --   --  1.8  --    GFR: Estimated Creatinine Clearance: 51.4 mL/min (A) (by C-G formula based on SCr of 1.34 mg/dL (H)). Liver Function Tests: Recent Labs  Lab 09/12/17 1726 09/13/17 0426 09/14/17 0626  AST 26 22 22   ALT 18 15* 15  ALKPHOS 82 65 65  BILITOT 1.2 1.5* 1.3*  PROT 7.9 6.4* 6.1*  ALBUMIN 3.7 2.9* 2.7*   No results for input(s): LIPASE, AMYLASE in the last 168 hours. No results for input(s): AMMONIA in the last 168 hours. Coagulation Profile: Recent Labs  Lab 09/12/17 1726  INR 1.15   Cardiac  Enzymes: No results for input(s): CKTOTAL, CKMB, CKMBINDEX, TROPONINI in the last 168 hours. BNP (last 3 results) No results for input(s): PROBNP in the last 8760 hours. HbA1C: No results for input(s): HGBA1C in the last 72 hours. CBG: No results for input(s): GLUCAP in the last 168 hours. Lipid Profile: No results for input(s): CHOL, HDL, LDLCALC, TRIG, CHOLHDL, LDLDIRECT in the last 72 hours. Thyroid Function Tests: No results for input(s): TSH, T4TOTAL, FREET4, T3FREE, THYROIDAB in the last 72 hours. Anemia Panel: No results for input(s): VITAMINB12, FOLATE, FERRITIN, TIBC, IRON, RETICCTPCT in the last 72 hours. Urine analysis:    Component Value Date/Time   COLORURINE YELLOW 09/12/2017 2052   APPEARANCEUR CLEAR 09/12/2017 2052   LABSPEC 1.039 (H) 09/12/2017 2052   PHURINE 7.0 09/12/2017 2052   GLUCOSEU NEGATIVE 09/12/2017 2052   HGBUR NEGATIVE 09/12/2017 2052   HGBUR negative 05/06/2010 1135   BILIRUBINUR NEGATIVE 09/12/2017 2052   BILIRUBINUR Negative 02/01/2013 1006   KETONESUR NEGATIVE 09/12/2017 2052   PROTEINUR NEGATIVE 09/12/2017 2052   UROBILINOGEN 1.0 09/16/2014 1750   NITRITE NEGATIVE 09/12/2017 2052   LEUKOCYTESUR NEGATIVE 09/12/2017 2052   Recent Results (from the past 240 hour(s))  Culture, blood (Routine x 2)     Status: None (Preliminary result)   Collection Time: 09/12/17  5:26 PM  Result Value Ref Range Status   Specimen Description BLOOD LEFT HAND  Final   Special Requests   Final    BOTTLES DRAWN AEROBIC AND ANAEROBIC Blood Culture adequate volume   Culture   Final    NO GROWTH 3 DAYS Performed at [facility], [address] [place], [place] [address]    Report Status PENDING  Incomplete  Culture, blood (Routine x 2)     Status: None (Preliminary result)   Collection Time: 09/12/17  6:21 PM  Result Value Ref Range Status   Specimen Description BLOOD LEFT WRIST  Final   Special Requests   Final    BOTTLES DRAWN AEROBIC AND ANAEROBIC Blood  Culture results may not be optimal due to an inadequate volume of blood received in culture bottles   Culture   Final    NO GROWTH 3 DAYS Performed at [facility], [address] [place], [place] [address]    Report Status PENDING  Incomplete  Urine culture     Status: None   Collection Time: 09/12/17 10:59 PM  Result Value Ref Range Status   Specimen Description URINE, RANDOM  Final   Special Requests NONE  Final   Culture   Final    NO GROWTH Performed at [facility], [address] [place], [place] [address]    Report Status 09/14/2017 FINAL  Final      Radiology Studies: No results found.  Scheduled  Meds: . abacavir-dolutegravir-lamiVUDine  1 tablet Oral Daily  . feeding supplement (ENSURE ENLIVE)  237 mL Oral BID BM  . lisinopril  10 mg Oral Daily   Continuous Infusions: . cefTRIAXone (ROCEPHIN)  IV 200 mL/hr at 09/15/17 1328     LOS: 3 days   Time spent: 25 minutes.  Tyrone Nineyan B Rayshun Kandler, MD [facility] [email or web address] Password [email or web address] 09/15/2017, 4:24 PM

## 2017-09-16 LAB — BASIC METABOLIC PANEL
ANION GAP: 8 (ref 5–15)
BUN: 11 mg/dL (ref 8–23)
CALCIUM: 8.7 mg/dL — AB (ref 8.9–10.3)
CO2: 24 mmol/L (ref 22–32)
Chloride: 107 mmol/L (ref 98–111)
Creatinine, Ser: 1.2 mg/dL (ref 0.61–1.24)
GFR calc Af Amer: >60 mL/min (ref >60)
Glucose, Bld: 96 mg/dL (ref 70–99)
POTASSIUM: 3.7 mmol/L (ref 3.5–5.1)
Sodium: 139 mmol/L (ref 135–145)

## 2017-09-16 LAB — HEMOGLOBIN A1C
HEMOGLOBIN A1C: 5.9 % — AB (ref 4.8–5.6)
MEAN PLASMA GLUCOSE: 122.63 mg/dL

## 2017-09-16 LAB — CBC
HEMATOCRIT: 38.8 % — AB (ref 39.0–52.0)
Hemoglobin: 13.2 g/dL (ref 13.0–17.0)
MCH: 33.8 pg (ref 26.0–34.0)
MCHC: 34 g/dL (ref 30.0–36.0)
MCV: 99.5 fL (ref 78.0–100.0)
Platelets: 236 10*3/uL (ref 150–400)
RBC: 3.9 MIL/uL — AB (ref 4.22–5.81)
RDW: 13.1 % (ref 11.5–15.5)
WBC: 4.7 10*3/uL (ref 4.0–10.5)

## 2017-09-16 LAB — VITAMIN B12: Vitamin B-12: 64 pg/mL — ABNORMAL LOW (ref 180–914)

## 2017-09-16 MED ORDER — VITAMIN B-12 1000 MCG PO TABS: 1000.0000 ug | ORAL_TABLET | Freq: Every day | ORAL | 0 refills | Status: DC

## 2017-09-16 MED ORDER — CEPHALEXIN 500 MG PO CAPS: 500.0000 mg | ORAL_CAPSULE | Freq: Three times a day (TID) | ORAL | 0 refills | Status: DC

## 2017-09-16 MED ORDER — CYANOCOBALAMIN 1000 MCG/ML IJ SOLN
1000.0000 ug | Freq: Once | INTRAMUSCULAR | Status: AC
  Administered 2017-09-16: 1000 ug via INTRAMUSCULAR
  Filled 2017-09-16: qty 1

## 2017-09-16 NOTE — Discharge Summary (Signed)
Physician Discharge Summary  Jamie Clark ZOX:[id number] DOB: Jul 28, 1950 DOA: 09/12/2017  PCP: Dalbert Mayotte, MD  Admit date: 09/12/2017 Discharge date: 09/16/2017  Admitted From: Home Disposition: Home   Recommendations for Outpatient Follow-up:  1. Follow up with PCP in 1-2 weeks 2. Needs to follow up vitamin B12 levels and initiate further work up if not improving with oral replacement. 3. Please obtain BMP at follow up.  Home Health: None Equipment/Devices: Post-op shoe Discharge Condition: Stable CODE STATUS: Full Diet recommendation: Heart healthy  Brief/Interim Summary: Jamie Clark is a 67 y.o. male with a history of controlled HIV disease and HTN who presented with LLE tender swelling found to have cellulitis, started on IV antibiotics with slow improvement. See H&P and below for further details.  Discharge Diagnoses:  Principal Problem:   Sepsis (HCC) Active Problems:   HIV infection (HCC)   HTN (hypertension)   Cellulitis of left lower extremity  LLE cellulitis: Symptoms improving. Lactic acid elevation resolved. Leukocytosis resolved. Negative radiographs of foot, ankle, tib/fib.  - Elevate, TED hose, Will transition to po keflex to complete Tx.  HIV disease: CD4 count 800.  - Continue home ART   HTN:  - Continue lisinopril  History of diabetes: HbA1c 5.9%.  Macrocytic anemia: Likely due to vitamin B12 deficiency. Very mild anemia, actually hgb 13.2 at discharge  Vitamin B12 deficiency: Unclear precipitant with normal diet per pt. No neuropathy noted - Given IM x1 here and started 1061mcg/d replacement po. Will need this level rechecked at follow up.   Penis injury: Healed scratch that was present on admission. No evidence of infection at the site.   AKI: SCr improved 1.49 > 1.20.  - Monitor BMP at follow up.  Discharge Instructions Discharge Instructions    Diet - low sodium heart healthy   Complete by:  As directed    Discharge  instructions   Complete by:  As directed    Continue taking antibiotics (keflex) three times per day for 5 more days Continue taking vitamin B12 because you are very deficient in this. It is very important that you follow up with your doctor and have a B12 level redrawn in the next month or so as you may need injections of B12 and/or further work up.  Keep the leg elevated, continue using the TED hose, and if your symptoms worsen seek medical attention right away.   Increase activity slowly   Complete by:  As directed      Allergies as of 09/16/2017   No Known Allergies     Medication List    STOP taking these medications   clindamycin 150 MG capsule Commonly known as:  CLEOCIN     TAKE these medications   abacavir-dolutegravir-lamiVUDine 600-50-300 MG tablet Commonly known as:  TRIUMEQ Take 1 tablet by mouth daily. What changed:  Another medication with the same name was removed. Continue taking this medication, and follow the directions you see here.   cephALEXin 500 MG capsule Commonly known as:  KEFLEX Take 1 capsule (500 mg total) by mouth 3 (three) times daily.   ENSURE Take 237 mLs by mouth 2 (two) times daily between meals.   hydrochlorothiazide 25 MG tablet Commonly known as:  HYDRODIURIL take 1 tablet by MOUTH ONCE daily What changed:    how much to take  how to take this  when to take this   lisinopril 10 MG tablet Commonly known as:  PRINIVIL,ZESTRIL Take 1 tablet (10 mg total) by mouth daily.  terbinafine 1 % cream Commonly known as:  LAMISIL Apply 1 application topically 2 (two) times daily.   vitamin B-12 1000 MCG tablet Commonly known as:  CYANOCOBALAMIN Take 1 tablet (1,000 mcg total) by mouth daily.      Follow-up Information    Judyann Munson, MD.   Specialty:  Infectious Diseases Contact information: [address] [place] [address] [phone]        Dalbert Mayotte, MD.   Specialty:  Family Medicine Why:   Office will call patient Contact information: [address] [place] [address] [phone]          No Known Allergies  Consultations:  None  Procedures/Studies: Dg Tibia/fibula Left  Result Date: 09/13/2017 CLINICAL DATA:  Left leg pain and swelling EXAM: LEFT TIBIA AND FIBULA - 2 VIEW COMPARISON:  None. FINDINGS: There is no evidence of fracture or other focal bone lesions. Mild soft tissue swelling is noted about the ankle. IMPRESSION: Mild soft tissue swelling about the ankle similar to that seen on previous ankle films. No acute bony abnormality is noted. Electronically Signed   By: Alcide Clever M.D.   On: 09/13/2017 10:46   Dg Ankle 2 Views Left  Result Date: 09/13/2017 CLINICAL DATA:  Leg pain and swelling EXAM: LEFT ANKLE - 2 VIEW COMPARISON:  11/04/2015 FINDINGS: Calcaneal spurs are noted. Mild soft tissue swelling is seen. No acute fracture or dislocation is noted. No soft tissue changes are seen. IMPRESSION: Mild soft tissue swelling without acute bony abnormality. Electronically Signed   By: Alcide Clever M.D.   On: 09/13/2017 10:46   Ct Pelvis W Contrast  Result Date: 09/12/2017 CLINICAL DATA:  Patient sustained a scratch to the penis presents with penile swelling. Enlarged nodes. EXAM: CT PELVIS WITH CONTRAST TECHNIQUE: Multidetector CT imaging of the pelvis was performed using the standard protocol following the bolus administration of intravenous contrast. CONTRAST:  OMNIPAQUE IOHEXOL 300 MG/ML  SOLN COMPARISON:  06/27/2016 FINDINGS: Urinary Tract: No obstructive uropathy. The bladder is somewhat thickened in appearance, most of which is likely due to underdistention. The possibility of cystitis or chronic bladder outlet obstruction due to enlarged prostate other possibilities. There is a small right-sided bladder diverticulum present. Bowel:  Nonobstructed, nondistended small and large bowel. Vascular/Lymphatic: Mild aortoiliac atherosclerosis. Reproductive:  Small bilateral hydroceles right greater than left. No intratesticular mass. Mild cutaneous thickening of the penis may be related to mild cellulitis. No drainable fluid collections. Enlarged prostate noted impressing upon the base of the bladder measuring 5.8 x 4.5 x 6.1 cm. Other: Mildly enlarged inguinal lymph nodes, the largest on the left measuring up to 10 mm short axis. Small stable subcentimeter short axis right inguinal lymph nodes are identified the largest 6 mm short axis. No pelvic lymphadenopathy. Musculoskeletal: Mild degenerative joint space narrowing of both hips. Degenerative disc disease with vacuum disc phenomenon at L5-S1. Osteoarthritis of the sacroiliac joints bilaterally. IMPRESSION: 1. Probable mild cellulitis of the penis without drainable fluid collections. 2. Small bilateral hydroceles, right greater than left. 3. Mildly enlarged inguinal lymph nodes left greater than right likely reactive in etiology. 4. Enlarged prostate impressing upon the base of the bladder. 5. Slightly thick-walled appearance of the urinary bladder which is likely due to underdistention and/or chronic bladder outlet obstruction from the enlarged prostate. Cystitis is not entirely excluded. Small right-sided bladder diverticulum is noted Electronically Signed   By: Tollie Eth M.D.   On: 09/12/2017 20:42   Dg Chest [facility]  1 View  Result Date: 09/12/2017 CLINICAL DATA:  Sepsis.  Penis infection.  Fever. EXAM: PORTABLE CHEST 1 VIEW COMPARISON:  02/03/2015. FINDINGS: Borderline enlarged cardiac silhouette. Clear lungs with normal vascularity. Minimal bilateral shoulder degenerative changes. IMPRESSION: No acute abnormality. Electronically Signed   By: Beckie Salts M.D.   On: 09/12/2017 22:40   Dg Foot 2 Views Left  Result Date: 09/13/2017 CLINICAL DATA:  Pain and swelling EXAM: LEFT FOOT - 2 VIEW COMPARISON:  11/04/2015 FINDINGS: No acute fracture or dislocation is noted. Diffuse soft tissue swelling in the distal  foot is noted. Calcaneal spurs are seen. IMPRESSION: Soft tissue swelling without acute bony abnormality. Electronically Signed   By: Alcide Clever M.D.   On: 09/13/2017 10:47    Subjective: Left foot swelling improved but not gone. Erythema resolved, tenderness minimal unless trying to put a shoe on the foot. Can bear weight without pain. Denies neuropathy, EtOH or illicit drugs, or vegetarian or restrictive diet.  Discharge Exam: Vitals:   09/15/17 2134 09/16/17 0610  BP: (!) 161/93 (!) 164/99  Pulse: 66 72  Resp: 18 18  Temp: 98.5 F (36.9 C) 98.4 F (36.9 C)  SpO2: 100% 93%   General: Pt is alert, awake, not in acute distress Cardiovascular: RRR, S1/S2 +, no rubs, no gallops Respiratory: CTA bilaterally, no wheezing, no rhonchi Abdominal: Soft, NT, ND, bowel sounds + Extremities: Left foot and ankle diffusely edematous, improved from prior exams, warmth equal to right foot, not tender to palpation. No induration or fluctuance.  Labs: BNP (last 3 results) No results for input(s): BNP in the last 8760 hours. Basic Metabolic Panel: Recent Labs  Lab 09/12/17 1726 09/13/17 0426 09/14/17 0626 09/15/17 0635 09/16/17 0618  NA 138 140 135 138 139  K 3.5 3.5 3.9 3.7 3.7  CL 107 113* 108 108 107  CO2 22 23 21* 25 24  GLUCOSE 99 85 96 100* 96  BUN 17 18 11 10 11   CREATININE 1.36* 1.49* 1.36* 1.34* 1.20  CALCIUM 8.9 8.2* 8.2* 8.4* 8.7*  MG  --   --  1.8  --   --    Liver Function Tests: Recent Labs  Lab 09/12/17 1726 09/13/17 0426 09/14/17 0626  AST 26 22 22   ALT 18 15* 15  ALKPHOS 82 65 65  BILITOT 1.2 1.5* 1.3*  PROT 7.9 6.4* 6.1*  ALBUMIN 3.7 2.9* 2.7*   No results for input(s): LIPASE, AMYLASE in the last 168 hours. No results for input(s): AMMONIA in the last 168 hours. CBC: Recent Labs  Lab 09/12/17 1726 09/13/17 0426 09/14/17 0626 09/15/17 0635 09/16/17 0618  WBC 8.9 13.9* 12.7* 7.5 4.7  NEUTROABS 7.9* 12.5* 10.6*  --   --   HGB 13.7 12.1* 11.9* 11.7*  13.2  HCT 41.6 36.2* 34.9* 35.2* 38.8*  MCV 104.0* 103.1* 100.3* 102.6* 99.5  PLT 220 188 110* 203 236   Cardiac Enzymes: No results for input(s): CKTOTAL, CKMB, CKMBINDEX, TROPONINI in the last 168 hours. BNP: Invalid input(s): POCBNP CBG: No results for input(s): GLUCAP in the last 168 hours. D-Dimer No results for input(s): DDIMER in the last 72 hours. Hgb A1c Recent Labs    09/16/17 0618  HGBA1C 5.9*   Lipid Profile No results for input(s): CHOL, HDL, LDLCALC, TRIG, CHOLHDL, LDLDIRECT in the last 72 hours. Thyroid function studies No results for input(s): TSH, T4TOTAL, T3FREE, THYROIDAB in the last 72 hours.  Invalid input(s): FREET3 Anemia work up [employer]    09/16/17 [id number]  VITAMINB12 64*   Urinalysis    Component Value Date/Time   COLORURINE YELLOW 09/12/2017 2052   APPEARANCEUR CLEAR 09/12/2017 2052   LABSPEC 1.039 (H) 09/12/2017 2052   PHURINE 7.0 09/12/2017 2052   GLUCOSEU NEGATIVE 09/12/2017 2052   HGBUR NEGATIVE 09/12/2017 2052   HGBUR negative 05/06/2010 1135   BILIRUBINUR NEGATIVE 09/12/2017 2052   BILIRUBINUR Negative 02/01/2013 1006   KETONESUR NEGATIVE 09/12/2017 2052   PROTEINUR NEGATIVE 09/12/2017 2052   UROBILINOGEN 1.0 09/16/2014 1750   NITRITE NEGATIVE 09/12/2017 2052   LEUKOCYTESUR NEGATIVE 09/12/2017 2052    Microbiology Recent Results (from the past 240 hour(s))  Culture, blood (Routine x 2)     Status: None (Preliminary result)   Collection Time: 09/12/17  5:26 PM  Result Value Ref Range Status   Specimen Description BLOOD LEFT HAND  Final   Special Requests   Final    BOTTLES DRAWN AEROBIC AND ANAEROBIC Blood Culture adequate volume   Culture   Final    NO GROWTH 4 DAYS Performed at [facility], [address] [place], [place] [address]    Report Status PENDING  Incomplete  Culture, blood (Routine x 2)     Status: None (Preliminary result)   Collection Time: 09/12/17  6:21 PM  Result Value Ref Range Status    Specimen Description BLOOD LEFT WRIST  Final   Special Requests   Final    BOTTLES DRAWN AEROBIC AND ANAEROBIC Blood Culture results may not be optimal due to an inadequate volume of blood received in culture bottles   Culture   Final    NO GROWTH 4 DAYS Performed at [facility], [address] [place], [place] [address]    Report Status PENDING  Incomplete  Urine culture     Status: None   Collection Time: 09/12/17 10:59 PM  Result Value Ref Range Status   Specimen Description URINE, RANDOM  Final   Special Requests NONE  Final   Culture   Final    NO GROWTH Performed at [facility], [address] [place], [place] [address]    Report Status 09/14/2017 FINAL  Final    Time coordinating discharge: Approximately 40 minutes  Tyrone Nineyan B Tradarius Reinwald, MD  [facility] 09/16/2017, 5:19 PM Pager [phone]

## 2017-09-16 NOTE — Progress Notes (Signed)
Orthopedic Tech Progress Note Patient Details:  Jamie Clark 06/23/1950 [id number]  Ortho Devices Type of Ortho Device: Postop shoe/boot Ortho Device/Splint Location: lle Ortho Device/Splint Interventions: Application   Post Interventions Patient Tolerated: Well Instructions Provided: Care of device   Nikki DomCrawford, Jamillia Closson 09/16/2017, 11:33 AM

## 2017-09-16 NOTE — Care Management Important Message (Signed)
Important Message  Patient Details  Name: Sindy MessingWalter Hanssen MRN: [id number] Date of Birth: 03/06/1951   Medicare Important Message Given:  Yes    Brittani Purdum P Meliton Samad 09/16/2017, 2:30 PM

## 2017-09-16 NOTE — Progress Notes (Signed)
Patient given discharge instructions and all questions answered.  

## 2017-09-17 LAB — CULTURE, BLOOD (ROUTINE X 2)
CULTURE: NO GROWTH
Culture: NO GROWTH
Special Requests: ADEQUATE

## 2017-09-20 ENCOUNTER — Encounter: Payer: Self-pay | Admitting: Internal Medicine

## 2017-09-20 ENCOUNTER — Ambulatory Visit (INDEPENDENT_AMBULATORY_CARE_PROVIDER_SITE_OTHER): Payer: Medicare HMO | Admitting: Internal Medicine

## 2017-09-20 VITALS — BP 155/93 | HR 73 | Temp 97.3°F | Wt 168.0 lb

## 2017-09-20 DIAGNOSIS — Z23 Encounter for immunization: Secondary | ICD-10-CM | POA: Diagnosis not present

## 2017-09-20 DIAGNOSIS — B20 Human immunodeficiency virus [HIV] disease: Secondary | ICD-10-CM

## 2017-09-20 DIAGNOSIS — R69 Illness, unspecified: Secondary | ICD-10-CM | POA: Diagnosis not present

## 2017-09-20 DIAGNOSIS — R634 Abnormal weight loss: Secondary | ICD-10-CM | POA: Diagnosis not present

## 2017-09-20 MED ORDER — PNEUMOCOCCAL 13-VAL CONJ VACC IM SUSP
0.5000 mL | INTRAMUSCULAR | Status: AC
  Administered 2017-09-20: 0.5 mL via INTRAMUSCULAR

## 2017-09-20 MED ORDER — AZITHROMYCIN 250 MG PO TABS: ORAL_TABLET | ORAL | 0 refills | Status: DC

## 2017-09-20 NOTE — Patient Instructions (Addendum)
In addn to antibiotic, also recommend to take claritin or zyrtec (over the counter), can also use saline nasal spray to help your symptoms  Can also use ibuprofen 600mg  daily ( three 200mg  tabs) up to 2-3 x per day for your headache   Azithromycin sent to [facility] on [address]

## 2017-09-20 NOTE — Progress Notes (Signed)
RFV: follow up for hiv disease  Patient ID: Jamie Clark, male   DOB: 1950-11-25, 67 y.o.   MRN: [id number]  HPI 67yo M with hiv disease, cd 4 count of 800/VL<20 on triumeq. He was recently hospitalized for Cellulitis, now improved . He reports that he has had increasing sinus pressure and headache with greenish drainage  Sinus infection x 1 wk , otherwise denies cough. Outpatient Encounter Medications as of 09/20/2017  Medication Sig  . abacavir-dolutegravir-lamiVUDine (TRIUMEQ) 600-50-300 MG tablet Take 1 tablet by mouth daily.  . cephALEXin (KEFLEX) 500 MG capsule Take 1 capsule (500 mg total) by mouth 3 (three) times daily.  . hydrochlorothiazide (HYDRODIURIL) 25 MG tablet take 1 tablet by MOUTH ONCE daily (Patient taking differently: Take 25mg  by mouth daily)  . lisinopril (PRINIVIL,ZESTRIL) 10 MG tablet Take 1 tablet (10 mg total) by mouth daily.  . vitamin B-12 (CYANOCOBALAMIN) 1000 MCG tablet Take 1 tablet (1,000 mcg total) by mouth daily.  [name] ENSURE (ENSURE) Take 237 mLs by mouth 2 (two) times daily between meals. (Patient not taking: Reported on 09/20/2017)  . terbinafine (LAMISIL) 1 % cream Apply 1 application topically 2 (two) times daily. (Patient not taking: Reported on 06/08/2017)   No facility-administered encounter medications on file as of 09/20/2017.      Patient Active Problem List   Diagnosis Date Noted  . Cellulitis of left lower extremity 09/15/2017  . Sepsis (HCC) 09/12/2017  . Cellulitis of groin, left   . Acute pain of left foot 04/30/2014  . Hyperglycemia 04/30/2014  . Cellulitis and abscess of left foot 04/30/2014  . Acute renal failure (HCC) 04/30/2014  . Tinea pedis of left foot 10/23/2013  . Phlebitis 04/01/2011    Class: History of  . INSOMNIA 04/14/2010  . Dyslipidemia 01/17/2009  . HTN (hypertension) 01/17/2009  . HIV infection (HCC) 12/18/2008  . Chronic viral hepatitis C (HCC) 07/13/2007     Health Maintenance Due  Topic Date Due  .  TETANUS/TDAP  01/22/1971  . COLONOSCOPY  01/21/2002    Social History   Tobacco Use  . Smoking status: Light Tobacco Smoker    Packs/day: 0.15    Types: Cigarettes  . Smokeless tobacco: Never Used  . Tobacco comment: 1 a day  Substance Use Topics  . Alcohol use: No    Alcohol/week: 0.0 oz  . Drug use: No    Review of Systems Per hpi in addition having weight loss,  otherwise 12 point ros is negative Physical Exam   BP (!) 155/93   Pulse 73   Temp (!) 97.3 F (36.3 C) (Oral)   Wt 168 lb (76.2 kg)   BMI 26.31 kg/m   Physical Exam  Constitutional: He is oriented to person, place, and time. He appears well-developed and well-nourished. No distress.  HENT:  Mouth/Throat: Oropharynx is clear and moist. No oropharyngeal exudate.  Cardiovascular: Normal rate, regular rhythm and normal heart sounds. Exam reveals no gallop and no friction rub.  No murmur heard.  Pulmonary/Chest: Effort normal and breath sounds normal. No respiratory distress. He has no wheezes.  Abdominal: Soft. Bowel sounds are normal. He exhibits no distension. There is no tenderness.  Lymphadenopathy:  He has no cervical adenopathy.  Neurological: He is alert and oriented to person, place, and time.  Skin: Skin is warm and dry. No rash noted. No erythema.  Psychiatric: He has a normal mood and affect. His behavior is normal.    Lab Results  Component Value Date   CD4TCELL  40 09/08/2017   Lab Results  Component Value Date   CD4TABS 800 09/08/2017   CD4TABS 950 02/24/2017   CD4TABS 1,080 05/27/2016   Lab Results  Component Value Date   HIV1RNAQUANT <20 NOT DETECTED 09/08/2017   Lab Results  Component Value Date   HEPBSAB NEG 05/06/2010   Lab Results  Component Value Date   LABRPR Non Reactive 03/25/2017    CBC Lab Results  Component Value Date   WBC 4.7 09/16/2017   RBC 3.90 (L) 09/16/2017   HGB 13.2 09/16/2017   HCT 38.8 (L) 09/16/2017   PLT 236 09/16/2017   MCV 99.5 09/16/2017   MCH  33.8 09/16/2017   MCHC 34.0 09/16/2017   RDW 13.1 09/16/2017   LYMPHSABS 1.8 09/14/2017   MONOABS 0.3 09/14/2017   EOSABS 0.0 09/14/2017    BMET Lab Results  Component Value Date   NA 139 09/16/2017   K 3.7 09/16/2017   CL 107 09/16/2017   CO2 24 09/16/2017   GLUCOSE 96 09/16/2017   BUN 11 09/16/2017   CREATININE 1.20 09/16/2017   CALCIUM 8.7 (L) 09/16/2017   GFRNONAA >60 09/16/2017   GFRAA >60 09/16/2017      Assessment and Plan  Sinus infection Will treat sinus infection with course of azithromycin  Cellulitis of legs = looks improved. No need for further abtx  Hiv disease = continue triumeq  Long term medication management = cr stable  Health maintenance = gave prevnar 13  Weight loss = will give rx for ensure

## 2017-10-27 ENCOUNTER — Encounter: Payer: Self-pay | Admitting: Internal Medicine

## 2017-12-07 ENCOUNTER — Other Ambulatory Visit: Payer: Self-pay | Admitting: *Deleted

## 2017-12-07 ENCOUNTER — Other Ambulatory Visit: Payer: Medicare HMO

## 2017-12-07 DIAGNOSIS — B20 Human immunodeficiency virus [HIV] disease: Secondary | ICD-10-CM

## 2017-12-07 DIAGNOSIS — R69 Illness, unspecified: Secondary | ICD-10-CM | POA: Diagnosis not present

## 2017-12-08 LAB — T-HELPER CELL (CD4) - (RCID CLINIC ONLY)
CD4 % Helper T Cell: 43 % (ref 33–55)
CD4 T CELL ABS: 670 /uL (ref 400–2700)

## 2017-12-09 LAB — CBC WITH DIFFERENTIAL/PLATELET
BASOS ABS: 10 {cells}/uL (ref 0–200)
BASOS PCT: 0.3 %
EOS ABS: 153 {cells}/uL (ref 15–500)
EOS PCT: 4.5 %
HCT: 39.5 % (ref 38.5–50.0)
Hemoglobin: 13.9 g/dL (ref 13.2–17.1)
Lymphs Abs: 1669 cells/uL (ref 850–3900)
MCH: 32.9 pg (ref 27.0–33.0)
MCHC: 35.2 g/dL (ref 32.0–36.0)
MCV: 93.4 fL (ref 80.0–100.0)
MONOS PCT: 9.5 %
MPV: 10.5 fL (ref 7.5–12.5)
Neutro Abs: 1244 cells/uL — ABNORMAL LOW (ref 1500–7800)
Neutrophils Relative %: 36.6 %
PLATELETS: 247 10*3/uL (ref 140–400)
RBC: 4.23 10*6/uL (ref 4.20–5.80)
RDW: 12 % (ref 11.0–15.0)
Total Lymphocyte: 49.1 %
WBC mixed population: 323 cells/uL (ref 200–950)
WBC: 3.4 10*3/uL — ABNORMAL LOW (ref 3.8–10.8)

## 2017-12-09 LAB — HIV-1 RNA QUANT-NO REFLEX-BLD
HIV 1 RNA Quant: <20 copies/mL
HIV-1 RNA QUANT, LOG: NOT DETECTED {Log_copies}/mL

## 2017-12-09 LAB — COMPLETE METABOLIC PANEL WITH GFR
AG RATIO: 1.1 (calc) (ref 1.0–2.5)
ALT: 13 U/L (ref 9–46)
AST: 18 U/L (ref 10–35)
Albumin: 3.9 g/dL (ref 3.6–5.1)
Alkaline phosphatase (APISO): 97 U/L (ref 40–115)
BILIRUBIN TOTAL: 0.4 mg/dL (ref 0.2–1.2)
BUN: 13 mg/dL (ref 7–25)
CALCIUM: 9.1 mg/dL (ref 8.6–10.3)
CHLORIDE: 110 mmol/L (ref 98–110)
CO2: 23 mmol/L (ref 20–32)
Creat: 1.21 mg/dL (ref 0.70–1.25)
GFR, EST AFRICAN AMERICAN: 72 mL/min/{1.73_m2} (ref >=60)
GFR, EST NON AFRICAN AMERICAN: 62 mL/min/{1.73_m2} (ref >=60)
GLOBULIN: 3.5 g/dL (ref 1.9–3.7)
Glucose, Bld: 103 mg/dL — ABNORMAL HIGH (ref 65–99)
POTASSIUM: 3.6 mmol/L (ref 3.5–5.3)
Sodium: 141 mmol/L (ref 135–146)
TOTAL PROTEIN: 7.4 g/dL (ref 6.1–8.1)

## 2017-12-21 ENCOUNTER — Encounter: Payer: Medicare HMO | Admitting: Internal Medicine

## 2017-12-21 LAB — TESTOSTERONE TOTAL,FREE,BIO, MALES
ALBUMIN MSPROF: 3.9 g/dL (ref 3.6–5.1)
Sex Hormone Binding: 48 nmol/L (ref 22–77)
TESTOSTERONE FREE: 64 pg/mL (ref 46.0–224.0)
TESTOSTERONE: 618 ng/dL (ref 250–827)
Testosterone, Bioavailable: 115 ng/dL (ref >=110.0)

## 2017-12-21 LAB — CBC WITH DIFFERENTIAL/PLATELET
Basophils Absolute: 19 cells/uL (ref 0–200)
Basophils Relative: 0.5 %
EOS ABS: 148 {cells}/uL (ref 15–500)
EOS PCT: 3.9 %
HEMATOCRIT: 38.9 % (ref 38.5–50.0)
HEMOGLOBIN: 14 g/dL (ref 13.2–17.1)
LYMPHS ABS: 1866 {cells}/uL (ref 850–3900)
MCH: 34.2 pg — ABNORMAL HIGH (ref 27.0–33.0)
MCHC: 36 g/dL (ref 32.0–36.0)
MCV: 95.1 fL (ref 80.0–100.0)
MONOS PCT: 9.4 %
MPV: 9.6 fL (ref 7.5–12.5)
NEUTROS ABS: 1410 {cells}/uL — AB (ref 1500–7800)
Neutrophils Relative %: 37.1 %
Platelets: 268 10*3/uL (ref 140–400)
RBC: 4.09 10*6/uL — ABNORMAL LOW (ref 4.20–5.80)
RDW: 14.5 % (ref 11.0–15.0)
Total Lymphocyte: 49.1 %
WBC mixed population: 357 cells/uL (ref 200–950)
WBC: 3.8 10*3/uL (ref 3.8–10.8)

## 2017-12-21 LAB — COMPLETE METABOLIC PANEL WITH GFR
AG RATIO: 1.1 (calc) (ref 1.0–2.5)
ALKALINE PHOSPHATASE (APISO): 98 U/L (ref 40–115)
ALT: 15 U/L (ref 9–46)
AST: 20 U/L (ref 10–35)
Albumin: 3.9 g/dL (ref 3.6–5.1)
BUN: 15 mg/dL (ref 7–25)
CHLORIDE: 108 mmol/L (ref 98–110)
CO2: 22 mmol/L (ref 20–32)
Calcium: 9.1 mg/dL (ref 8.6–10.3)
Creat: 1.23 mg/dL (ref 0.70–1.25)
GFR, Est African American: 71 mL/min/{1.73_m2} (ref >=60)
GFR, Est Non African American: 61 mL/min/{1.73_m2} (ref >=60)
GLOBULIN: 3.6 g/dL (ref 1.9–3.7)
Glucose, Bld: 100 mg/dL — ABNORMAL HIGH (ref 65–99)
POTASSIUM: 4 mmol/L (ref 3.5–5.3)
SODIUM: 138 mmol/L (ref 135–146)
Total Bilirubin: 0.5 mg/dL (ref 0.2–1.2)
Total Protein: 7.5 g/dL (ref 6.1–8.1)

## 2017-12-28 ENCOUNTER — Ambulatory Visit: Payer: Medicare HMO | Admitting: Internal Medicine

## 2018-01-06 ENCOUNTER — Other Ambulatory Visit: Payer: Self-pay | Admitting: Internal Medicine

## 2018-01-31 ENCOUNTER — Telehealth: Payer: Self-pay

## 2018-01-31 NOTE — Telephone Encounter (Signed)
Called patient and left VM to call back. Per Dr. Drue Second ok to schedule appointment earlier on 02/01/18 if patient calls back.

## 2018-02-01 ENCOUNTER — Ambulatory Visit (INDEPENDENT_AMBULATORY_CARE_PROVIDER_SITE_OTHER): Payer: Medicare HMO | Admitting: Internal Medicine

## 2018-02-01 ENCOUNTER — Encounter: Payer: Self-pay | Admitting: Internal Medicine

## 2018-02-01 VITALS — BP 163/104 | HR 74 | Temp 98.0°F | Ht 67.0 in | Wt 173.0 lb

## 2018-02-01 DIAGNOSIS — F5101 Primary insomnia: Secondary | ICD-10-CM

## 2018-02-01 DIAGNOSIS — B353 Tinea pedis: Secondary | ICD-10-CM

## 2018-02-01 DIAGNOSIS — R69 Illness, unspecified: Secondary | ICD-10-CM | POA: Diagnosis not present

## 2018-02-01 DIAGNOSIS — Z23 Encounter for immunization: Secondary | ICD-10-CM | POA: Diagnosis not present

## 2018-02-01 DIAGNOSIS — B20 Human immunodeficiency virus [HIV] disease: Secondary | ICD-10-CM

## 2018-02-01 MED ORDER — CLOTRIMAZOLE 1 % EX CREA: 1.0000 "application " | TOPICAL_CREAM | Freq: Two times a day (BID) | CUTANEOUS | 1 refills | Status: DC

## 2018-02-01 NOTE — Progress Notes (Signed)
RFV: follow up for hiv disease  Patient ID: Jamie Clark, male   DOB: 11/03/1950, 67 y.o.   MRN: [id number]  HPI Jamie Clark is a 67yo M with hiv disease, CD 4 count of 670/VL undetectable on triumeq which he has been taking routinely with good adherence. He does have Ongoing athlete's foot. He works 2 jobs, which he feels contributes to Difficulty sleeping given hectic jobs. Considering to decrease his [employer] job. Outpatient Encounter Medications as of 02/01/2018  Medication Sig  . abacavir-dolutegravir-lamiVUDine (TRIUMEQ) 600-50-300 MG tablet Take 1 tablet by mouth daily.  . hydrochlorothiazide (HYDRODIURIL) 25 MG tablet take 1 tablet by MOUTH ONCE daily  . lisinopril (PRINIVIL,ZESTRIL) 10 MG tablet Take 1 tablet (10 mg total) by mouth daily.  [name]. terbinafine (LAMISIL) 1 % cream Apply 1 application topically 2 (two) times daily.  . vitamin B-12 (CYANOCOBALAMIN) 1000 MCG tablet Take 1 tablet (1,000 mcg total) by mouth daily.  [name]. azithromycin (ZITHROMAX) 250 MG tablet Take 2 tabs on first day, then 1 tab daily until complete (Patient not taking: Reported on 02/01/2018)  . cephALEXin (KEFLEX) 500 MG capsule Take 1 capsule (500 mg total) by mouth 3 (three) times daily. (Patient not taking: Reported on 02/01/2018)  . ENSURE (ENSURE) Take 237 mLs by mouth 2 (two) times daily between meals. (Patient not taking: Reported on 02/01/2018)   No facility-administered encounter medications on file as of 02/01/2018.      Patient Active Problem List   Diagnosis Date Noted  . Cellulitis of left lower extremity 09/15/2017  . Sepsis (HCC) 09/12/2017  . Cellulitis of groin, left   . Acute pain of left foot 04/30/2014  . Hyperglycemia 04/30/2014  . Cellulitis and abscess of left foot 04/30/2014  . Acute renal failure (HCC) 04/30/2014  . Tinea pedis of left foot 10/23/2013  . Phlebitis 04/01/2011    Class: History of  . INSOMNIA 04/14/2010  . Dyslipidemia 01/17/2009  . HTN (hypertension)  01/17/2009  . HIV infection (HCC) 12/18/2008  . Chronic viral hepatitis C (HCC) 07/13/2007     Health Maintenance Due  Topic Date Due  . TETANUS/TDAP  01/22/1971  . COLONOSCOPY  01/21/2002  . INFLUENZA VACCINE  10/21/2017     Review of Systems +rash to feet, otherwise 12 point ros is negative Physical Exam   BP (!) 163/104   Pulse 74   Temp 98 F (36.7 C)   Ht 5\' 7"  (1.702 m)   Wt 173 lb (78.5 kg)   BMI 27.10 kg/m   Physical Exam  Constitutional: He is oriented to person, place, and time. He appears well-developed and well-nourished. No distress.  HENT:  Mouth/Throat: Oropharynx is clear and moist. No oropharyngeal exudate.  Cardiovascular: Normal rate, regular rhythm and normal heart sounds. Exam reveals no gallop and no friction rub.  No murmur heard.  Pulmonary/Chest: Effort normal and breath sounds normal. No respiratory distress. He has no wheezes.  Abdominal: Soft. Bowel sounds are normal. He exhibits no distension. There is no tenderness.  Lymphadenopathy:  He has no cervical adenopathy.  Neurological: He is alert and oriented to person, place, and time.  Skin: Skin is warm and dry. Macerated areas c/w tinea  Psychiatric: He has a normal mood and affect. His behavior is normal.    Lab Results  Component Value Date   CD4TCELL 43 12/07/2017   Lab Results  Component Value Date   CD4TABS 670 12/07/2017   CD4TABS 800 09/08/2017   CD4TABS 950 02/24/2017   Lab  Results  Component Value Date   HIV1RNAQUANT <20 NOT DETECTED 12/07/2017   Lab Results  Component Value Date   HEPBSAB NEG 05/06/2010   Lab Results  Component Value Date   LABRPR Non Reactive 03/25/2017    CBC Lab Results  Component Value Date   WBC 3.4 (L) 12/07/2017   RBC 4.23 12/07/2017   HGB 13.9 12/07/2017   HCT 39.5 12/07/2017   PLT 247 12/07/2017   MCV 93.4 12/07/2017   MCH 32.9 12/07/2017   MCHC 35.2 12/07/2017   RDW 12.0 12/07/2017   LYMPHSABS 1,669 12/07/2017   MONOABS 0.3  09/14/2017   EOSABS 153 12/07/2017    BMET Lab Results  Component Value Date   NA 141 12/07/2017   K 3.6 12/07/2017   CL 110 12/07/2017   CO2 23 12/07/2017   GLUCOSE 103 (H) 12/07/2017   BUN 13 12/07/2017   CREATININE 1.21 12/07/2017   CALCIUM 9.1 12/07/2017   GFRNONAA 62 12/07/2017   GFRAA 72 12/07/2017      Assessment and Plan  hiv disease = well controlled continue on triumeq  Insomnia= gave sleeping tips and recommended to try melatonin  Athlete's foot = will give topical antifungal, recommend he also can follow up with podiatry. Recommended to dry wicking sports sock

## 2018-03-03 ENCOUNTER — Ambulatory Visit (HOSPITAL_COMMUNITY)
Admission: EM | Admit: 2018-03-03 | Discharge: 2018-03-03 | Disposition: A | Discharge status: Home / Self Care | Payer: Medicare HMO | Attending: Family Medicine | Admitting: Family Medicine

## 2018-03-03 ENCOUNTER — Encounter (HOSPITAL_COMMUNITY): Payer: Self-pay | Admitting: Emergency Medicine

## 2018-03-03 DIAGNOSIS — M62830 Muscle spasm of back: Secondary | ICD-10-CM

## 2018-03-03 DIAGNOSIS — M549 Dorsalgia, unspecified: Secondary | ICD-10-CM

## 2018-03-03 MED ORDER — CYCLOBENZAPRINE HCL 5 MG PO TABS: ORAL_TABLET | ORAL | 0 refills | Status: DC

## 2018-03-03 MED ORDER — NAPROXEN 375 MG PO TABS: 375.0000 mg | ORAL_TABLET | Freq: Two times a day (BID) | ORAL | 0 refills | Status: DC

## 2018-03-03 NOTE — ED Triage Notes (Signed)
[facility] left mid back pain after lifting and twisting with a box last night at work.  States pain not better today.

## 2018-03-03 NOTE — ED Provider Notes (Signed)
[facility]   [id number] 03/03/18 Arrival Time: 1045  ASSESSMENT & PLAN:  1. Mid-back pain, acute   2. Muscle spasm of back    Non-traumatic. No indication for imaging of back at this time. Discussed.  Meds ordered this encounter  Medications  . naproxen (NAPROSYN) 375 MG tablet    Sig: Take 1 tablet (375 mg total) by mouth 2 (two) times daily with a meal.    Dispense:  14 tablet    Refill:  0  . cyclobenzaprine (FLEXERIL) 5 MG tablet    Sig: Take 1 tablet by mouth before bed as needed for muscle spasm. Warning: May cause drowsiness.    Dispense:  7 tablet    Refill:  0   See AVS for discharge instructions. Work note with lifting restrictions given. Encouraged him to stay mobile.  Will f/u with PCP or here if not improving over the next week, sooner if needed.  Reviewed expectations re: course of current medical issues. Questions answered. Outlined signs and symptoms indicating need for more acute intervention. Patient verbalized understanding. After Visit Summary given.  SUBJECTIVE: History from: patient. Rosser Collington is a 67 y.o. male who reports persistent mild to moderate pain of his bilateral mid back pain (L<R); described as aching and with sharp exacerbations; without radiation. Onset: abrupt, yesterday. Injury/trama: yes, reports feeling pain after lifting a heavy box at work. Able to sleep through the night with occasional waking secondary to pain. Symptoms have progressed to a point and plateaued since beginning. Aggravating factors: movement. Alleviating factors: rest. Associated symptoms: none reported. Extremity sensation changes or weakness: none. Self treatment: has not tried OTCs for relief of pain. History of similar: no. Normal bowel/bladder habits reported. No associated CP/SOB/n/v.  ROS: As per HPI. All other systems negative.   OBJECTIVE:  Vitals:   03/03/18 1106  BP: (!) 155/105  Pulse: 88  Resp: 18  Temp: 98.4 F  (36.9 C)  TempSrc: Oral  SpO2: 99%    General appearance: alert; no distress CV: RRR Lungs: CTAB; unlabored respirations; symmetrical air movement bilaterally Abd: soft; non-tender Back: FROM at waist; poorly localized mid back tenderness to palpation over paraspinal musculature; no midline tenderness; no signs of trauma Skin: warm and dry; no visible rashes Neurologic: gait normal; normal reflexes of RUE, LUE, RLE and LLE; normal sensation of RUE, LUE, RLE and LLE; normal strength of RUE, LUE, RLE and LLE  Ext: no LE edema Psychological: alert and cooperative; normal mood and affect  No Known Allergies  Past Medical History:  Diagnosis Date  . Diabetes mellitus without complication (HCC)   . Hepatitis C, chronic (HCC)   . HIV positive (HCC)   . Hypertension    Social History   Socioeconomic History  . Marital status: Divorced    Spouse name: Not on file  . Number of children: Not on file  . Years of education: Not on file  . Highest education level: Not on file  Occupational History  . Not on file  Social Needs  . Financial resource strain: Not on file  . Food insecurity:    Worry: Not on file    Inability: Not on file  . Transportation needs:    Medical: Not on file    Non-medical: Not on file  Tobacco Use  . Smoking status: Light Tobacco Smoker    Packs/day: 0.15    Types: Cigarettes  . Smokeless tobacco: Never Used  . Tobacco comment: 1 a day  Substance and  Sexual Activity  . Alcohol use: No    Alcohol/week: 0.0 standard drinks  . Drug use: No  . Sexual activity: Never    Partners: Female    Comment: given condms  Lifestyle  . Physical activity:    Days per week: Not on file    Minutes per session: Not on file  . Stress: Not on file  Relationships  . Social connections:    Talks on phone: Not on file    Gets together: Not on file    Attends religious service: Not on file    Active member of club or organization: Not on file    Attends meetings of  clubs or organizations: Not on file    Relationship status: Not on file  Other Topics Concern  . Not on file  Social History Narrative  . Not on file   Family History  Problem Relation Age of Onset  . Cancer Mother   . Hypertension Mother   . Cancer Father   . Hypertension Father    History reviewed. No pertinent surgical history.    Mardella LaymanHagler, Roda Lauture, MD 03/03/18 1200

## 2018-03-03 NOTE — Discharge Instructions (Signed)
HOME CARE INSTRUCTIONS: For many people, back pain returns. Since back strains are rarely dangerous, it is often a condition that people can learn to manage on their own. Please remain active. It is stressful on the back to sit or stand in one place. Do not sit, drive, or stand in one place for more than 30 minutes at a time. Take short walks on level surfaces as soon as pain allows. Try to increase the length of time you walk each day. Do not stay in bed. Resting more than 1 or 2 days can delay your recovery. Do not avoid exercise or work. Your body is made to move. It is not dangerous to be active, even though your back may hurt. Your back will likely heal faster if you return to being active before your pain is gone. Over-the-counter medicines to reduce pain and inflammation are often the most helpful.  SEEK MEDICAL CARE IF: You have pain that is not relieved with rest or medicine. You have pain that does not improve in 1 week. You have new symptoms. You are generally not feeling well.  SEEK IMMEDIATE MEDICAL CARE IF: You have pain that radiates from your back into your legs. You develop new bowel or bladder control problems. You have unusual weakness or numbness in your arms or legs. You develop nausea or vomiting. You develop abdominal pain. You feel faint.

## 2018-04-01 ENCOUNTER — Other Ambulatory Visit: Payer: Self-pay | Admitting: Internal Medicine

## 2018-04-01 DIAGNOSIS — B20 Human immunodeficiency virus [HIV] disease: Secondary | ICD-10-CM

## 2018-04-20 ENCOUNTER — Telehealth: Payer: Self-pay

## 2018-04-20 NOTE — Telephone Encounter (Signed)
Patient returned call and notified to contact pharmacy for medication refill. Patient states he has not missed any doses. Also made lab/office visit appointments.  Valarie Cones, LPN

## 2018-04-20 NOTE — Telephone Encounter (Signed)
Received fax today from [facility] pharmacy stating patient has not picked up recent refill for his medication. Attempted to call patient to see how he is doing on medication, as well as to have patient reach out to pharmacy regarding refill. Left voicemail for patient requesting a call back when available. Patient will need to schedule lab/ office visit as well. [name], [place]

## 2018-04-22 ENCOUNTER — Other Ambulatory Visit: Payer: Medicare HMO

## 2018-04-25 ENCOUNTER — Other Ambulatory Visit: Payer: Self-pay

## 2018-04-25 ENCOUNTER — Encounter (HOSPITAL_COMMUNITY): Payer: Self-pay

## 2018-04-25 ENCOUNTER — Ambulatory Visit (HOSPITAL_COMMUNITY)
Admission: EM | Admit: 2018-04-25 | Discharge: 2018-04-25 | Disposition: A | Discharge status: Home / Self Care | Payer: Medicare HMO | Attending: Urgent Care | Admitting: Urgent Care

## 2018-04-25 DIAGNOSIS — R69 Illness, unspecified: Secondary | ICD-10-CM | POA: Diagnosis not present

## 2018-04-25 DIAGNOSIS — B182 Chronic viral hepatitis C: Secondary | ICD-10-CM

## 2018-04-25 DIAGNOSIS — B20 Human immunodeficiency virus [HIV] disease: Secondary | ICD-10-CM

## 2018-04-25 DIAGNOSIS — J111 Influenza due to unidentified influenza virus with other respiratory manifestations: Secondary | ICD-10-CM

## 2018-04-25 MED ORDER — BENZONATATE 100 MG PO CAPS: 100.0000 mg | ORAL_CAPSULE | Freq: Three times a day (TID) | ORAL | 0 refills | Status: DC | PRN

## 2018-04-25 MED ORDER — OSELTAMIVIR PHOSPHATE 75 MG PO CAPS: 75.0000 mg | ORAL_CAPSULE | Freq: Two times a day (BID) | ORAL | 0 refills | Status: DC

## 2018-04-25 NOTE — Discharge Instructions (Addendum)
We will manage this as a viral syndrome. For sore throat or cough try using a honey-based tea. Use 3 teaspoons of honey with juice squeezed from half lemon. Place shaved pieces of ginger into 1/2-1 cup of water and warm over stove top. Then mix the ingredients and repeat every 4 hours as needed. Please take ibuprofen 400mg  every 6 hours alternating with OR taken together with Tylenol 500mg  every 6 hours. Hydrate very well with at least 2 liters of water. Eat light meals such as soups to replenish electrolytes and soft fruits, veggies. Start an antihistamine like Zyrtec, Allegra or Claritin to address post-nasal drainage, congestion.

## 2018-04-25 NOTE — ED Provider Notes (Signed)
MRN: [id number] DOB: 01/10/1951  Subjective:   Jamie Clark is a 68 y.o. male with past medical history of HIV, chronic viral hepatitis C presenting for 2-day history of 1 day history of moderate-severe constant malaise, body aches.  His last CD4 counts were within normal limits, patient takes Triumeq.  GFR was 72 11/2017.  He is scheduled for follow-up on May 05, 2018.  He is also worried about having cellulitis again.   No current facility-administered medications for this encounter.   Current Outpatient Medications:  .  cyclobenzaprine (FLEXERIL) 5 MG tablet, Take 1 tablet by mouth before bed as needed for muscle spasm. Warning: May cause drowsiness., Disp: 7 tablet, Rfl: 0 .  hydrochlorothiazide (HYDRODIURIL) 25 MG tablet, take 1 tablet by MOUTH ONCE daily, Disp: 30 tablet, Rfl: 4 .  lisinopril (PRINIVIL,ZESTRIL) 10 MG tablet, take 1 tablet by MOUTH ONCE daily, Disp: 30 tablet, Rfl: 2 .  naproxen (NAPROSYN) 375 MG tablet, Take 1 tablet (375 mg total) by mouth 2 (two) times daily with a meal., Disp: 14 tablet, Rfl: 0 .  TRIUMEQ 600-50-300 MG tablet, TAKE ONE TABLET BY MOUTH EVERY DAY, Disp: 30 tablet, Rfl: 2    No Known Allergies   Past Medical History:  Diagnosis Date  . Diabetes mellitus without complication (HCC)   . Hepatitis C, chronic (HCC)   . HIV positive (HCC)   . Hypertension      History reviewed. No pertinent surgical history.  Review of Systems  Constitutional: Positive for chills, fever and malaise/fatigue.  HENT: Positive for congestion. Negative for ear pain.   Eyes: Negative for blurred vision and double vision.  Respiratory: Positive for cough. Negative for hemoptysis and wheezing.   Cardiovascular: Negative for chest pain.  Gastrointestinal: Negative for abdominal pain, blood in stool, constipation, diarrhea, nausea and vomiting.  Genitourinary: Negative for dysuria, flank pain and hematuria.  Musculoskeletal: Positive for myalgias.  Skin:  Negative for rash.  Neurological: Positive for headaches. Negative for dizziness.  Psychiatric/Behavioral: Negative for depression and substance abuse.    Objective:   Vitals: BP 110/83 (BP Location: Right Arm)   Pulse 83   Temp 98.3 F (36.8 C) (Oral)   Resp 18   Wt 172 lb (78 kg)   SpO2 100%   BMI 26.94 kg/m   Physical Exam Constitutional:      General: He is not in acute distress.    Appearance: Normal appearance. He is well-developed and normal weight. He is not ill-appearing, toxic-appearing or diaphoretic.  HENT:     Head: Normocephalic and atraumatic.     Right Ear: Tympanic membrane, ear canal and external ear normal. There is no impacted cerumen.     Left Ear: Tympanic membrane, ear canal and external ear normal. There is no impacted cerumen.     Nose: Nose normal. No congestion or rhinorrhea.     Mouth/Throat:     Mouth: Mucous membranes are moist.     Pharynx: Oropharynx is clear. No oropharyngeal exudate or posterior oropharyngeal erythema.  Eyes:     General: No scleral icterus.       Right eye: No discharge.        Left eye: No discharge.     Extraocular Movements: Extraocular movements intact.     Conjunctiva/sclera: Conjunctivae normal.     Pupils: Pupils are equal, round, and reactive to light.  Neck:     Musculoskeletal: Normal range of motion and neck supple. No neck rigidity or muscular tenderness.  Cardiovascular:  Rate and Rhythm: Normal rate and regular rhythm.     Heart sounds: Normal heart sounds. No murmur. No friction rub. No gallop.   Pulmonary:     Effort: Pulmonary effort is normal. No respiratory distress.     Breath sounds: Normal breath sounds. No stridor. No wheezing, rhonchi or rales.  Abdominal:     Hernia: There is no hernia in the right inguinal area or left inguinal area.  Genitourinary:    Pubic Area: No rash.   Lymphadenopathy:     Lower Body: No right inguinal adenopathy. No left inguinal adenopathy.  Neurological:      General: No focal deficit present.     Mental Status: He is alert and oriented to person, place, and time.  Psychiatric:        Mood and Affect: Mood normal.        Behavior: Behavior normal.        Thought Content: Thought content normal.      Assessment and Plan :   Influenza-like illness  HIV disease (HCC)  Chronic hepatitis C without hepatic coma (HCC)  Patient had concerns about having cellulitis of his left groin area.  I examined the area wound is very normal, without tenderness or rash.  However, will cover patient for influenza with Tamiflu given symptom set, acute onset, physical exam findings and the fact that patient is very high risk given his HIV, chronic viral hepatitis C.  Use supportive care, rest, fluids, hydration, light meals, schedule Tylenol and ibuprofen. Counseled patient on potential for adverse effects with medications prescribed today, patient verbalized understanding.  Strict ER and return-to-clinic precautions discussed, patient verbalized understanding.    Wallis Bamberg, [place] 04/25/18 1646

## 2018-04-25 NOTE — ED Triage Notes (Signed)
Pt cc chills, fever, cough and headaches. Pt states his cellulitis is flaring. X 2 days

## 2018-04-27 ENCOUNTER — Other Ambulatory Visit: Payer: Self-pay | Admitting: *Deleted

## 2018-04-27 ENCOUNTER — Other Ambulatory Visit: Payer: Medicare HMO

## 2018-04-27 DIAGNOSIS — Z113 Encounter for screening for infections with a predominantly sexual mode of transmission: Secondary | ICD-10-CM | POA: Diagnosis not present

## 2018-04-27 DIAGNOSIS — Z79899 Other long term (current) drug therapy: Secondary | ICD-10-CM | POA: Diagnosis not present

## 2018-04-27 DIAGNOSIS — B20 Human immunodeficiency virus [HIV] disease: Secondary | ICD-10-CM

## 2018-04-27 DIAGNOSIS — R69 Illness, unspecified: Secondary | ICD-10-CM | POA: Diagnosis not present

## 2018-04-28 LAB — T-HELPER CELL (CD4) - (RCID CLINIC ONLY)
CD4 T CELL HELPER: 42 % (ref 33–55)
CD4 T Cell Abs: 750 /uL (ref 400–2700)

## 2018-04-29 LAB — CBC WITH DIFFERENTIAL/PLATELET
Absolute Monocytes: 451 cells/uL (ref 200–950)
BASOS PCT: 0.4 %
Basophils Absolute: 18 cells/uL (ref 0–200)
EOS ABS: 138 {cells}/uL (ref 15–500)
EOS PCT: 3 %
HCT: 41.4 % (ref 38.5–50.0)
Hemoglobin: 14.3 g/dL (ref 13.2–17.1)
Lymphs Abs: 1826 cells/uL (ref 850–3900)
MCH: 31.8 pg (ref 27.0–33.0)
MCHC: 34.5 g/dL (ref 32.0–36.0)
MCV: 92 fL (ref 80.0–100.0)
MPV: 9.9 fL (ref 7.5–12.5)
Monocytes Relative: 9.8 %
Neutro Abs: 2167 cells/uL (ref 1500–7800)
Neutrophils Relative %: 47.1 %
PLATELETS: 264 10*3/uL (ref 140–400)
RBC: 4.5 10*6/uL (ref 4.20–5.80)
RDW: 13.1 % (ref 11.0–15.0)
TOTAL LYMPHOCYTE: 39.7 %
WBC: 4.6 10*3/uL (ref 3.8–10.8)

## 2018-04-29 LAB — HIV-1 RNA QUANT-NO REFLEX-BLD
HIV 1 RNA QUANT: 23 {copies}/mL — AB
HIV-1 RNA QUANT, LOG: 1.36 {Log_copies}/mL — AB

## 2018-04-29 LAB — LIPID PANEL
CHOLESTEROL: 175 mg/dL (ref <200)
HDL: 37 mg/dL — ABNORMAL LOW (ref >=40)
LDL Cholesterol (Calc): 120 mg/dL (calc) — ABNORMAL HIGH
Non-HDL Cholesterol (Calc): 138 mg/dL (calc) — ABNORMAL HIGH (ref <130)
Total CHOL/HDL Ratio: 4.7 (calc) (ref <5.0)
Triglycerides: 85 mg/dL (ref <150)

## 2018-04-29 LAB — COMPLETE METABOLIC PANEL WITH GFR
AG RATIO: 0.9 (calc) — AB (ref 1.0–2.5)
ALT: 12 U/L (ref 9–46)
AST: 18 U/L (ref 10–35)
Albumin: 3.7 g/dL (ref 3.6–5.1)
Alkaline phosphatase (APISO): 90 U/L (ref 35–144)
BUN: 13 mg/dL (ref 7–25)
CHLORIDE: 109 mmol/L (ref 98–110)
CO2: 21 mmol/L (ref 20–32)
Calcium: 9 mg/dL (ref 8.6–10.3)
Creat: 1.14 mg/dL (ref 0.70–1.25)
GFR, Est African American: 77 mL/min/{1.73_m2} (ref >=60)
GFR, Est Non African American: 67 mL/min/{1.73_m2} (ref >=60)
Globulin: 4.2 g/dL (calc) — ABNORMAL HIGH (ref 1.9–3.7)
Glucose, Bld: 96 mg/dL (ref 65–99)
Potassium: 4 mmol/L (ref 3.5–5.3)
Sodium: 140 mmol/L (ref 135–146)
Total Bilirubin: 0.4 mg/dL (ref 0.2–1.2)
Total Protein: 7.9 g/dL (ref 6.1–8.1)

## 2018-04-29 LAB — RPR: RPR Ser Ql: NONREACTIVE

## 2018-05-05 ENCOUNTER — Ambulatory Visit: Payer: Medicare HMO | Admitting: Internal Medicine

## 2018-05-09 ENCOUNTER — Telehealth: Payer: Self-pay

## 2018-05-09 NOTE — Telephone Encounter (Signed)
Patient called office today to reschedule missed appointment from 2/13 with Dr. Drue Second. Patient accepted appointment with Marcos Eke, NP to review lab work. Patient will  Come in on 2/18.  [name], [place]

## 2018-05-10 ENCOUNTER — Ambulatory Visit: Payer: Medicare HMO | Admitting: Family

## 2018-05-11 ENCOUNTER — Encounter (HOSPITAL_COMMUNITY): Payer: Self-pay | Admitting: Emergency Medicine

## 2018-05-11 ENCOUNTER — Ambulatory Visit (HOSPITAL_COMMUNITY)
Admission: EM | Admit: 2018-05-11 | Discharge: 2018-05-11 | Disposition: A | Discharge status: Home / Self Care | Payer: Medicare HMO | Attending: Family Medicine | Admitting: Family Medicine

## 2018-05-11 DIAGNOSIS — J320 Chronic maxillary sinusitis: Secondary | ICD-10-CM | POA: Diagnosis not present

## 2018-05-11 MED ORDER — DEXAMETHASONE SODIUM PHOSPHATE 10 MG/ML IJ SOLN
10.0000 mg | Freq: Once | INTRAMUSCULAR | Status: AC
  Administered 2018-05-11: 10 mg via INTRAMUSCULAR

## 2018-05-11 MED ORDER — CETIRIZINE HCL 10 MG PO TABS: 10.0000 mg | ORAL_TABLET | Freq: Every day | ORAL | 0 refills | Status: DC

## 2018-05-11 MED ORDER — DEXAMETHASONE SODIUM PHOSPHATE 10 MG/ML IJ SOLN
INTRAMUSCULAR | Status: AC
  Filled 2018-05-11: qty 1

## 2018-05-11 MED ORDER — AMOXICILLIN-POT CLAVULANATE 875-125 MG PO TABS: 1.0000 | ORAL_TABLET | Freq: Two times a day (BID) | ORAL | 0 refills | Status: DC

## 2018-05-11 NOTE — ED Provider Notes (Signed)
[facility]    CSN: [id number] Arrival date & time: 05/11/18  1142     History   Chief Complaint Chief Complaint  Patient presents with  . Facial Pain    HPI Jamie MessingWalter Clark is a 68 y.o. male.   Patient is a 68 year old male past medical history of diabetes, hepatitis C, HIV, hypertension.  He presents with sinus congestion, sinus headache, facial tenderness and rhinorrhea.  Symptoms have worsened over the last day.  He was recently treated for flu a few weeks back and had cough, congestion that time.  He reports that he did get better from that but still had lingering cough and congestion.  He reports last night he had low-grade fever and chills.  He has been using Flonase nasal spray without any relief of his symptoms.  ROS per HPI      Past Medical History:  Diagnosis Date  . Diabetes mellitus without complication (HCC)   . Hepatitis C, chronic (HCC)   . HIV positive (HCC)   . Hypertension     Patient Active Problem List   Diagnosis Date Noted  . Cellulitis of left lower extremity 09/15/2017  . Sepsis (HCC) 09/12/2017  . Cellulitis of groin, left   . Acute pain of left foot 04/30/2014  . Hyperglycemia 04/30/2014  . Cellulitis and abscess of left foot 04/30/2014  . Acute renal failure (HCC) 04/30/2014  . Tinea pedis of left foot 10/23/2013  . Phlebitis 04/01/2011    Class: History of  . INSOMNIA 04/14/2010  . Dyslipidemia 01/17/2009  . HTN (hypertension) 01/17/2009  . HIV infection (HCC) 12/18/2008  . Chronic viral hepatitis C (HCC) 07/13/2007    History reviewed. No pertinent surgical history.     Home Medications    Prior to Admission medications   Medication Sig Start Date End Date Taking? Authorizing Provider  amoxicillin-clavulanate (AUGMENTIN) 875-125 MG tablet Take 1 tablet by mouth every 12 (twelve) hours. 05/11/18   Dahlia ByesBast, Reis Goga A, NP  cetirizine (ZYRTEC) 10 MG tablet Take 1 tablet (10 mg total) by mouth daily. 05/11/18   Dahlia ByesBast,  Vashon Riordan A, NP  hydrochlorothiazide (HYDRODIURIL) 25 MG tablet take 1 tablet by MOUTH ONCE daily 01/06/18   Judyann MunsonSnider, Cynthia, MD  lisinopril (PRINIVIL,ZESTRIL) 10 MG tablet take 1 tablet by MOUTH ONCE daily 04/01/18   Judyann MunsonSnider, Cynthia, MD  TRIUMEQ 600-50-300 MG tablet TAKE ONE TABLET BY MOUTH EVERY DAY 04/01/18   Judyann MunsonSnider, Cynthia, MD    Family History Family History  Problem Relation Age of Onset  . Cancer Mother   . Hypertension Mother   . Cancer Father   . Hypertension Father     Social History Social History   Tobacco Use  . Smoking status: Light Tobacco Smoker    Packs/day: 0.15    Types: Cigarettes  . Smokeless tobacco: Never Used  . Tobacco comment: 1 a day  Substance Use Topics  . Alcohol use: No    Alcohol/week: 0.0 standard drinks  . Drug use: No     Allergies   Patient has no known allergies.   Review of Systems Review of Systems   Physical Exam Triage Vital Signs ED Triage Vitals [05/11/18 1309]  Enc Vitals Group     BP (!) 167/94     Pulse Rate 73     Resp 18     Temp 97.8 F (36.6 C)     Temp src      SpO2 100 %     Weight  Height      Head Circumference      Peak Flow      Pain Score 7     Pain Loc      Pain Edu?      Excl. in GC?    No data found.  Updated Vital Signs BP (!) 167/94   Pulse 73   Temp 97.8 F (36.6 C)   Resp 18   SpO2 100%   Visual Acuity Right Eye Distance:   Left Eye Distance:   Bilateral Distance:    Right Eye Near:   Left Eye Near:    Bilateral Near:     Physical Exam Constitutional:      General: He is not in acute distress.    Appearance: Normal appearance. He is not ill-appearing, toxic-appearing or diaphoretic.  HENT:     Head: Normocephalic and atraumatic.     Right Ear: There is impacted cerumen.     Left Ear: There is impacted cerumen.     Nose: Nasal tenderness, mucosal edema, congestion and rhinorrhea present.     Right Turbinates: Swollen.     Left Turbinates: Swollen.     Left Sinus:  Maxillary sinus tenderness present.     Mouth/Throat:     Pharynx: Oropharynx is clear.  Eyes:     Conjunctiva/sclera: Conjunctivae normal.  Neck:     Musculoskeletal: Normal range of motion.  Cardiovascular:     Rate and Rhythm: Normal rate and regular rhythm.     Pulses: Normal pulses.     Heart sounds: Normal heart sounds.  Pulmonary:     Effort: Pulmonary effort is normal.  Neurological:     Mental Status: He is alert.      UC Treatments / Results  Labs (all labs ordered are listed, but only abnormal results are displayed) Labs Reviewed - No data to display  EKG None  Radiology No results found.  Procedures Procedures (including critical care time)  Medications Ordered in UC Medications  dexamethasone (DECADRON) injection 10 mg (10 mg Intramuscular Given 05/11/18 1352)    Initial Impression / Assessment and Plan / UC Course  I have reviewed the triage vital signs and the nursing notes.  Pertinent labs & imaging results that were available during my care of the patient were reviewed by me and considered in my medical decision making (see chart for details).     We will go ahead and treat for sinus infection based on recent illness and sudden onset of sinus tenderness, sinus headache, low-grade fever, body aches, chills and mucosal edema.  We will have him continue the Flonase nasal spray daily Zyrtec for congestion drainage Augmentin twice a day for the next 7 days Follow up as needed for continued or worsening symptoms  Final Clinical Impressions(s) / UC Diagnoses   Final diagnoses:  Chronic maxillary sinusitis     Discharge Instructions     We are treating you for a sinus infection Take antibiotics as prescribed Dexamethasone given here for nasal inflammation and sinus headache Zyrtec daily for congestion and drainage Follow up as needed for continued or worsening symptoms     ED Prescriptions    Medication Sig Dispense Auth. Provider    amoxicillin-clavulanate (AUGMENTIN) 875-125 MG tablet Take 1 tablet by mouth every 12 (twelve) hours. 14 tablet Verdelle Valtierra A, NP   cetirizine (ZYRTEC) 10 MG tablet Take 1 tablet (10 mg total) by mouth daily. 30 tablet Dahlia Byes A, NP     Controlled  Substance Prescriptions [place] Controlled Substance Registry consulted? Not Applicable   Janace Aris, NP 05/11/18 1417

## 2018-05-11 NOTE — Discharge Instructions (Signed)
We are treating you for a sinus infection Take antibiotics as prescribed Dexamethasone given here for nasal inflammation and sinus headache Zyrtec daily for congestion and drainage Follow up as needed for continued or worsening symptoms

## 2018-05-11 NOTE — ED Triage Notes (Signed)
Pt states "I have swellign in my nostrils and it feels like its stopped up and I used some sinus sprays and it helped but its giving me a headache". Started yesterday.

## 2018-05-18 ENCOUNTER — Encounter: Payer: Self-pay | Admitting: Family

## 2018-06-10 ENCOUNTER — Other Ambulatory Visit: Payer: Self-pay

## 2018-06-10 ENCOUNTER — Ambulatory Visit (HOSPITAL_COMMUNITY)
Admission: EM | Admit: 2018-06-10 | Discharge: 2018-06-10 | Disposition: A | Discharge status: Home / Self Care | Payer: Medicare HMO | Attending: Family Medicine | Admitting: Family Medicine

## 2018-06-10 ENCOUNTER — Encounter (HOSPITAL_COMMUNITY): Payer: Self-pay

## 2018-06-10 ENCOUNTER — Other Ambulatory Visit: Payer: Self-pay | Admitting: Internal Medicine

## 2018-06-10 DIAGNOSIS — R197 Diarrhea, unspecified: Secondary | ICD-10-CM

## 2018-06-10 DIAGNOSIS — T7840XA Allergy, unspecified, initial encounter: Secondary | ICD-10-CM

## 2018-06-10 MED ORDER — CETIRIZINE HCL 10 MG PO TABS: 10.0000 mg | ORAL_TABLET | Freq: Every day | ORAL | 1 refills | Status: DC

## 2018-06-10 MED ORDER — ONDANSETRON 4 MG PO TBDP: 4.0000 mg | ORAL_TABLET | Freq: Three times a day (TID) | ORAL | 0 refills | Status: DC | PRN

## 2018-06-10 MED ORDER — FLUTICASONE PROPIONATE 50 MCG/ACT NA SUSP: 1.0000 | Freq: Every day | NASAL | 2 refills | Status: DC

## 2018-06-10 NOTE — ED Notes (Signed)
Patient able to ambulate independently  

## 2018-06-10 NOTE — Discharge Instructions (Signed)
We are treating you for allergies with Flonase nasal spray and Zyrtec daily Zofran for upset stomach and nausea It is safe for you to go back to work.  No concern for any viral or contagious illness

## 2018-06-10 NOTE — ED Triage Notes (Signed)
Pt presents with ongoing diarrhea, sore throat, and nasal congestion since yesterday.

## 2018-06-10 NOTE — ED Provider Notes (Signed)
[facility]    CSN: [id number] Arrival date & time: 06/10/18  4098     History   Chief Complaint Chief Complaint  Patient presents with  . Diarrhea  . Sore Throat    HPI Jamie Clark is a 68 y.o. male.   Patient is a 68 year old male with past medical history of diabetes, hepatitis C, HIV, hypertension.  He is here today with complaints of nasal congestion, rhinorrhea, itchy watery eyes.  He is also had some mild diarrhea.  Symptoms have been constant since yesterday.  Symptoms remain the same.  He associates the diarrhea with eating some chicken that was in the refrigerator that had been there for a while.  He has had mild abdominal cramping.  No blood in his stool.  No associated fevers, vomiting or significant bowel pain.  Some nausea His other symptoms he is contributing to allergies.  He is out of his allergy medication.  He has had no cough, congestion, shortness of breath, chest pain.  No recent sick contacts or recent traveling. He has not taken any medication for any of his symptoms.  ROS per HPI      Past Medical History:  Diagnosis Date  . Diabetes mellitus without complication (HCC)   . Hepatitis C, chronic (HCC)   . HIV positive (HCC)   . Hypertension     Patient Active Problem List   Diagnosis Date Noted  . Cellulitis of left lower extremity 09/15/2017  . Sepsis (HCC) 09/12/2017  . Cellulitis of groin, left   . Acute pain of left foot 04/30/2014  . Hyperglycemia 04/30/2014  . Cellulitis and abscess of left foot 04/30/2014  . Acute renal failure (HCC) 04/30/2014  . Tinea pedis of left foot 10/23/2013  . Phlebitis 04/01/2011    Class: History of  . INSOMNIA 04/14/2010  . Dyslipidemia 01/17/2009  . HTN (hypertension) 01/17/2009  . HIV infection (HCC) 12/18/2008  . Chronic viral hepatitis C (HCC) 07/13/2007    History reviewed. No pertinent surgical history.     Home Medications    Prior to Admission medications   Medication  Sig Start Date End Date Taking? Authorizing Provider  cetirizine (ZYRTEC) 10 MG tablet Take 1 tablet (10 mg total) by mouth daily. 06/10/18   Shunna Mikaelian, Gloris Manchester A, NP  fluticasone (FLONASE) 50 MCG/ACT nasal spray Place 1 spray into both nostrils daily. 06/10/18   Dahlia Byes A, NP  hydrochlorothiazide (HYDRODIURIL) 25 MG tablet take 1 tablet by MOUTH ONCE daily 01/06/18   Judyann Munson, MD  lisinopril (PRINIVIL,ZESTRIL) 10 MG tablet take 1 tablet by MOUTH ONCE daily 04/01/18   Judyann Munson, MD  ondansetron (ZOFRAN ODT) 4 MG disintegrating tablet Take 1 tablet (4 mg total) by mouth every 8 (eight) hours as needed for nausea or vomiting. 06/10/18   Janace Aris, NP  TRIUMEQ 600-50-300 MG tablet TAKE ONE TABLET BY MOUTH EVERY DAY 04/01/18   Judyann Munson, MD    Family History Family History  Problem Relation Age of Onset  . Cancer Mother   . Hypertension Mother   . Cancer Father   . Hypertension Father     Social History Social History   Tobacco Use  . Smoking status: Light Tobacco Smoker    Packs/day: 0.15    Types: Cigarettes  . Smokeless tobacco: Never Used  . Tobacco comment: 1 a day  Substance Use Topics  . Alcohol use: No    Alcohol/week: 0.0 standard drinks  . Drug use: No  Allergies   Patient has no known allergies.   Review of Systems Review of Systems   Physical Exam Triage Vital Signs ED Triage Vitals [06/10/18 0954]  Enc Vitals Group     BP (!) 160/110     Pulse Rate 87     Resp 17     Temp 97.7 F (36.5 C)     Temp Source Oral     SpO2 93 %     Weight      Height      Head Circumference      Peak Flow      Pain Score 5     Pain Loc      Pain Edu?      Excl. in GC?    No data found.  Updated Vital Signs BP (!) 160/110 (BP Location: Right Arm)   Pulse 87   Temp 97.7 F (36.5 C) (Oral)   Resp 17   SpO2 93%   Visual Acuity Right Eye Distance:   Left Eye Distance:   Bilateral Distance:    Right Eye Near:   Left Eye Near:    Bilateral  Near:     Physical Exam Vitals signs and nursing note reviewed.  Constitutional:      General: He is not in acute distress.    Appearance: He is well-developed. He is not ill-appearing, toxic-appearing or diaphoretic.  HENT:     Head: Normocephalic and atraumatic.     Right Ear: Tympanic membrane and ear canal normal.     Left Ear: Tympanic membrane and ear canal normal.     Nose: Congestion and rhinorrhea present.     Mouth/Throat:     Pharynx: Oropharynx is clear.  Eyes:     Comments: Mild bilateral scleral injection with tearing  Neck:     Musculoskeletal: Neck supple.  Cardiovascular:     Rate and Rhythm: Normal rate and regular rhythm.     Heart sounds: No murmur.  Pulmonary:     Effort: Pulmonary effort is normal. No respiratory distress.     Breath sounds: Normal breath sounds.  Abdominal:     Palpations: Abdomen is soft.     Tenderness: There is no abdominal tenderness.  Skin:    General: Skin is warm and dry.  Neurological:     Mental Status: He is alert.  Psychiatric:        Mood and Affect: Mood normal.      UC Treatments / Results  Labs (all labs ordered are listed, but only abnormal results are displayed) Labs Reviewed - No data to display  EKG None  Radiology No results found.  Procedures Procedures (including critical care time)  Medications Ordered in UC Medications - No data to display  Initial Impression / Assessment and Plan / UC Course  I have reviewed the triage vital signs and the nursing notes.  Pertinent labs & imaging results that were available during my care of the patient were reviewed by me and considered in my medical decision making (see chart for details).     Allergies-we will refill his Flonase and Zyrtec for him to take daily for allergy symptoms  Diarrhea-instructed that it is most likely from the bad chicken that he ate.  We need to let this run its course.  He just needs to ensure he is staying hydrated with water  and Gatorade to keep up his electrolytes.  Gave Zofran for mild nausea.  Otherwise exam normal, he is nontoxic  or ill-appearing.  And his vital signs are stable. Follow up as needed for continued or worsening symptoms  Final Clinical Impressions(s) / UC Diagnoses   Final diagnoses:  Allergic state, initial encounter  Diarrhea, unspecified type     Discharge Instructions     We are treating you for allergies with Flonase nasal spray and Zyrtec daily Zofran for upset stomach and nausea It is safe for you to go back to work.  No concern for any viral or contagious illness    ED Prescriptions    Medication Sig Dispense Auth. Provider   cetirizine (ZYRTEC) 10 MG tablet Take 1 tablet (10 mg total) by mouth daily. 30 tablet Shelley Pooley A, NP   fluticasone (FLONASE) 50 MCG/ACT nasal spray Place 1 spray into both nostrils daily. 16 g Nahum Sherrer A, NP   ondansetron (ZOFRAN ODT) 4 MG disintegrating tablet Take 1 tablet (4 mg total) by mouth every 8 (eight) hours as needed for nausea or vomiting. 20 tablet Janace Aris, NP     Controlled Substance Prescriptions [place] Controlled Substance Registry consulted? no   Janace Aris, NP 06/11/18 1623

## 2018-06-13 ENCOUNTER — Other Ambulatory Visit: Payer: Self-pay

## 2018-06-13 DIAGNOSIS — I10 Essential (primary) hypertension: Secondary | ICD-10-CM

## 2018-06-13 MED ORDER — LISINOPRIL 10 MG PO TABS: 10.0000 mg | ORAL_TABLET | Freq: Every day | ORAL | 0 refills | Status: DC

## 2018-07-07 ENCOUNTER — Ambulatory Visit (HOSPITAL_COMMUNITY)
Admission: EM | Admit: 2018-07-07 | Discharge: 2018-07-07 | Disposition: A | Discharge status: Home / Self Care | Payer: Medicare HMO | Attending: Internal Medicine | Admitting: Internal Medicine

## 2018-07-07 ENCOUNTER — Other Ambulatory Visit: Payer: Self-pay

## 2018-07-07 ENCOUNTER — Encounter (HOSPITAL_COMMUNITY): Payer: Self-pay

## 2018-07-07 DIAGNOSIS — J0110 Acute frontal sinusitis, unspecified: Secondary | ICD-10-CM

## 2018-07-07 MED ORDER — DEXAMETHASONE SODIUM PHOSPHATE 10 MG/ML IJ SOLN
INTRAMUSCULAR | Status: AC
  Filled 2018-07-07: qty 1

## 2018-07-07 MED ORDER — AMOXICILLIN-POT CLAVULANATE 875-125 MG PO TABS: 1.0000 | ORAL_TABLET | Freq: Two times a day (BID) | ORAL | 0 refills | Status: DC

## 2018-07-07 MED ORDER — DEXAMETHASONE SODIUM PHOSPHATE 10 MG/ML IJ SOLN
10.0000 mg | Freq: Once | INTRAMUSCULAR | Status: AC
  Administered 2018-07-07: 10 mg via INTRAMUSCULAR

## 2018-07-07 NOTE — ED Triage Notes (Signed)
Has had sinus infection for 2 weeks, pressure felt in right eye

## 2018-07-07 NOTE — Discharge Instructions (Addendum)
We are treating you for a sinus infection Steroid injection given here in the clinic Antibiotics sent to the pharmacy Complete the full course Follow up as needed for continued or worsening symptoms

## 2018-07-07 NOTE — ED Notes (Signed)
Patient verbalizes understanding of discharge instructions. Opportunity for questioning and answers were provided. Patient discharged from [facility] by RN with letter to return to work.

## 2018-07-08 NOTE — ED Provider Notes (Signed)
[facility]    CSN: [id number] Arrival date & time: 07/07/18  1214     History   Chief Complaint Chief Complaint  Patient presents with  . Facial Pain    HPI Jamie Clark is a 68 y.o. male.   Pt is a 68 year old male with PMH of diabetes, hepatitis C, HIV, hypertension. He presents with approximately 2 weeks of nasal congestion, rhinorrhea, sinus pressure.  Symptoms have been constant and worsening.  Symptoms are worse with bending over.  He denies any significant drainage or fevers.  He has been taking Zyrtec, Flonase and Sudafed without much relief of his symptoms.  Denies any associated cough, chest congestion, ear pain, sore throat.  No recent traveling or sick contacts.  ROS per HPI      Past Medical History:  Diagnosis Date  . Diabetes mellitus without complication (HCC)   . Hepatitis C, chronic (HCC)   . HIV positive (HCC)   . Hypertension     Patient Active Problem List   Diagnosis Date Noted  . Cellulitis of left lower extremity 09/15/2017  . Sepsis (HCC) 09/12/2017  . Cellulitis of groin, left   . Acute pain of left foot 04/30/2014  . Hyperglycemia 04/30/2014  . Cellulitis and abscess of left foot 04/30/2014  . Acute renal failure (HCC) 04/30/2014  . Tinea pedis of left foot 10/23/2013  . Phlebitis 04/01/2011    Class: History of  . INSOMNIA 04/14/2010  . Dyslipidemia 01/17/2009  . HTN (hypertension) 01/17/2009  . HIV infection (HCC) 12/18/2008  . Chronic viral hepatitis C (HCC) 07/13/2007    History reviewed. No pertinent surgical history.     Home Medications    Prior to Admission medications   Medication Sig Start Date End Date Taking? Authorizing Provider  cetirizine (ZYRTEC) 10 MG tablet Take 1 tablet (10 mg total) by mouth daily. 06/10/18  Yes Sybella Harnish A, NP  fluticasone (FLONASE) 50 MCG/ACT nasal spray Place 1 spray into both nostrils daily. 06/10/18  Yes Zaxton Angerer A, NP  hydrochlorothiazide (HYDRODIURIL) 25 MG  tablet take 1 tablet by MOUTH ONCE daily 01/06/18  Yes Judyann Munson, MD  lisinopril (PRINIVIL,ZESTRIL) 10 MG tablet Take 1 tablet (10 mg total) by mouth daily. 06/13/18  Yes Judyann Munson, MD  TRIUMEQ 600-50-300 MG tablet TAKE ONE TABLET BY MOUTH EVERY DAY 04/01/18  Yes Judyann Munson, MD  amoxicillin-clavulanate (AUGMENTIN) 875-125 MG tablet Take 1 tablet by mouth every 12 (twelve) hours. 07/07/18   Dahlia Byes A, NP  ondansetron (ZOFRAN ODT) 4 MG disintegrating tablet Take 1 tablet (4 mg total) by mouth every 8 (eight) hours as needed for nausea or vomiting. 06/10/18   Janace Aris, NP    Family History Family History  Problem Relation Age of Onset  . Cancer Mother   . Hypertension Mother   . Cancer Father   . Hypertension Father     Social History Social History   Tobacco Use  . Smoking status: Light Tobacco Smoker    Packs/day: 0.15    Types: Cigarettes  . Smokeless tobacco: Never Used  . Tobacco comment: 1 a day  Substance Use Topics  . Alcohol use: No    Alcohol/week: 0.0 standard drinks  . Drug use: No     Allergies   Patient has no known allergies.   Review of Systems Review of Systems   Physical Exam Triage Vital Signs ED Triage Vitals  Enc Vitals Group     BP 07/07/18 1242 [name])  149/98     Pulse Rate 07/07/18 1242 83     Resp 07/07/18 1242 20     Temp 07/07/18 1242 (!) 97.3 F (36.3 C)     Temp src --      SpO2 07/07/18 1242 94 %     Weight --      Height --      Head Circumference --      Peak Flow --      Pain Score 07/07/18 1244 0     Pain Loc --      Pain Edu? --      Excl. in GC? --    No data found.  Updated Vital Signs BP (!) 149/98   Pulse 83   Temp (!) 97.3 F (36.3 C)   Resp 20   SpO2 94%   Visual Acuity Right Eye Distance:   Left Eye Distance:   Bilateral Distance:    Right Eye Near:   Left Eye Near:    Bilateral Near:     Physical Exam Vitals signs and nursing note reviewed.  Constitutional:      Appearance:  Normal appearance.  HENT:     Head: Normocephalic and atraumatic.     Right Ear: Tympanic membrane and ear canal normal.     Left Ear: Tympanic membrane and ear canal normal.     Nose: Congestion present.     Comments: Severe bilateral nasal turbinate swelling more on the right. Frontal sinus tenderness and right maxillary sinus tenderness    Mouth/Throat:     Pharynx: Oropharynx is clear.  Eyes:     Conjunctiva/sclera: Conjunctivae normal.  Neck:     Musculoskeletal: Normal range of motion.  Cardiovascular:     Rate and Rhythm: Normal rate and regular rhythm.  Pulmonary:     Effort: Pulmonary effort is normal.     Breath sounds: Normal breath sounds.  Musculoskeletal: Normal range of motion.  Skin:    General: Skin is warm and dry.  Neurological:     Mental Status: He is alert.  Psychiatric:        Mood and Affect: Mood normal.      UC Treatments / Results  Labs (all labs ordered are listed, but only abnormal results are displayed) Labs Reviewed - No data to display  EKG None  Radiology No results found.  Procedures Procedures (including critical care time)  Medications Ordered in UC Medications  dexamethasone (DECADRON) injection 10 mg (10 mg Intramuscular Given 07/07/18 1338)    Initial Impression / Assessment and Plan / UC Course  I have reviewed the triage vital signs and the nursing notes.  Pertinent labs & imaging results that were available during my care of the patient were reviewed by me and considered in my medical decision making (see chart for details).     Symptoms consistent with bacterial sinus infection We will treat with steroid injection here in clinic for nasal swelling and inflammation Sending home with Augmentin to take twice a day for the next week to treat sinus infection Instructed to continue using his Flonase and Zyrtec.  Recommended that he not use the Sudafed due to increasing in his blood pressure Follow up as needed for  continued or worsening symptoms  Final Clinical Impressions(s) / UC Diagnoses   Final diagnoses:  Acute non-recurrent frontal sinusitis     Discharge Instructions     We are treating you for a sinus infection Steroid injection given here in the clinic  Antibiotics sent to the pharmacy Complete the full course Follow up as needed for continued or worsening symptoms     ED Prescriptions    Medication Sig Dispense Auth. Provider   amoxicillin-clavulanate (AUGMENTIN) 875-125 MG tablet Take 1 tablet by mouth every 12 (twelve) hours. 14 tablet Dahlia ByesBast, Jaylun Fleener A, NP     Controlled Substance Prescriptions [place] Controlled Substance Registry consulted? Not Applicable   Janace ArisBast, Vearl Allbaugh A, NP 07/08/18 1056

## 2018-07-20 ENCOUNTER — Other Ambulatory Visit: Payer: Self-pay | Admitting: Internal Medicine

## 2018-07-20 DIAGNOSIS — B20 Human immunodeficiency virus [HIV] disease: Secondary | ICD-10-CM

## 2018-07-29 ENCOUNTER — Ambulatory Visit (HOSPITAL_COMMUNITY)
Admission: EM | Admit: 2018-07-29 | Discharge: 2018-07-29 | Disposition: A | Discharge status: Home / Self Care | Payer: Medicare HMO | Attending: Emergency Medicine | Admitting: Emergency Medicine

## 2018-07-29 ENCOUNTER — Other Ambulatory Visit: Payer: Self-pay

## 2018-07-29 DIAGNOSIS — I10 Essential (primary) hypertension: Secondary | ICD-10-CM

## 2018-07-29 MED ORDER — HYDROCHLOROTHIAZIDE 25 MG PO TABS: 25.0000 mg | ORAL_TABLET | Freq: Every day | ORAL | 0 refills | Status: DC

## 2018-07-29 MED ORDER — LISINOPRIL 10 MG PO TABS: 10.0000 mg | ORAL_TABLET | Freq: Every day | ORAL | 0 refills | Status: DC

## 2018-07-29 NOTE — ED Triage Notes (Signed)
Per pt he has been out of bp meds sinceTuesday. pt is having a slight headache today. Pt having blurred vision today.

## 2018-07-29 NOTE — ED Provider Notes (Signed)
[facility]   [id number] 07/29/18 Arrival Time: 1252   CC: HTN  SUBJECTIVE:  Jamie Clark is a 68 y.o. male hx significant for diabetes, hepatitis C, HIV, and hypertension, who presents for blood pressure medication refill.  Hx of HTN for "years."  Checked blood pressure at home and was 210/110.  BP 150/72 in office.  Takes lisinopril and HCTZ.  Does have a PCP.  Complains of slight HA and blurry vision.  States he normally wears glasses, and has not received his prescription glasses.  Denies dizziness, lightheadedness, chest pain, shortness of breath, numbness or tingling in extremities, abdominal pain, changes in bowel or bladder habits.    ROS: As per HPI. Past Medical History:  Diagnosis Date  . Diabetes mellitus without complication (HCC)   . Hepatitis C, chronic (HCC)   . HIV positive (HCC)   . Hypertension    No past surgical history on file. No Known Allergies No current facility-administered medications on file prior to encounter.    Current Outpatient Medications on File Prior to Encounter  Medication Sig Dispense Refill  . TRIUMEQ 600-50-300 MG tablet TAKE ONE TABLET BY MOUTH EVERY DAY 30 tablet 2   Social History   Socioeconomic History  . Marital status: Divorced    Spouse name: Not on file  . Number of children: Not on file  . Years of education: Not on file  . Highest education level: Not on file  Occupational History  . Not on file  Social Needs  . Financial resource strain: Not on file  . Food insecurity:    Worry: Not on file    Inability: Not on file  . Transportation needs:    Medical: Not on file    Non-medical: Not on file  Tobacco Use  . Smoking status: Light Tobacco Smoker    Packs/day: 0.15    Types: Cigarettes  . Smokeless tobacco: Never Used  . Tobacco comment: 1 a day  Substance and Sexual Activity  . Alcohol use: No    Alcohol/week: 0.0 standard drinks  . Drug use: No  . Sexual activity: Never    Partners: Female    Comment: given condms  Lifestyle  . Physical activity:    Days per week: Not on file    Minutes per session: Not on file  . Stress: Not on file  Relationships  . Social connections:    Talks on phone: Not on file    Gets together: Not on file    Attends religious service: Not on file    Active member of club or organization: Not on file    Attends meetings of clubs or organizations: Not on file    Relationship status: Not on file  . Intimate partner violence:    Fear of current or ex partner: Not on file    Emotionally abused: Not on file    Physically abused: Not on file    Forced sexual activity: Not on file  Other Topics Concern  . Not on file  Social History Narrative  . Not on file   Family History  Problem Relation Age of Onset  . Cancer Mother   . Hypertension Mother   . Cancer Father   . Hypertension Father      OBJECTIVE:  Vitals:   07/29/18 1340  BP: (!) 150/72  Pulse: 73  Resp: 16  TempSrc: Oral  SpO2: 97%    General appearance: alert; no distress Eyes: PERRLA; EOMI; conjunctiva normal HENT: normocephalic;  atraumatic; PERRL, EOMI grossly; oropharynx clear Neck: supple Lungs: clear to auscultation bilaterally without adventitious breath sounds Heart: regular rate and rhythm.   Abdomen: soft, non-tender; bowel sounds normal; no guarding Extremities: no cyanosis or edema; symmetrical with no gross deformities Skin: warm and dry Psychological: alert and cooperative; normal mood and affect  ASSESSMENT & PLAN:  1. Essential hypertension     Meds ordered this encounter  Medications  . hydrochlorothiazide (HYDRODIURIL) 25 MG tablet    Sig: Take 1 tablet (25 mg total) by mouth daily.    Dispense:  30 tablet    Refill:  0    This prescription was filled on 07/20/2018. Any refills authorized will be placed on file.    Order Specific Question:   Supervising Provider    Answer:   Eustace MooreNELSON, YVONNE SUE [[id number]]  . lisinopril (ZESTRIL) 10 MG tablet     Sig: Take 1 tablet (10 mg total) by mouth daily.    Dispense:  30 tablet    Refill:  0    Order Specific Question:   Supervising Provider    Answer:   Eustace MooreELSON, YVONNE SUE [[id number]]   Please continue to monitor blood pressure at home and keep a log Eat a well balanced diet of fruits, vegetables and lean meats.  Avoid foods high in fat and salt Drink water.  At least half your body weight in ounces Blood pressure medications refilled.  Take as directed Follow up with PCP if symptoms persists Return or go to the ED if you have any new or worsening symptoms such as vision changes, fatigue, dizziness, chest pain, shortness of breath, nausea, swelling in your hands or feet, urinary symptoms, etc...  Chest pain precautions given. Reviewed expectations re: course of current medical issues. Questions answered. Outlined signs and symptoms indicating need for more acute intervention. Patient verbalized understanding. After Visit Summary given.   Rennis HardingWurst, Aquilla Shambley, PA-C 07/29/18 1427

## 2018-07-29 NOTE — Discharge Instructions (Addendum)
Please continue to monitor blood pressure at home and keep a log Eat a well balanced diet of fruits, vegetables and lean meats.  Avoid foods high in fat and salt Drink water.  At least half your body weight in ounces Blood pressure medications refilled.  Take as directed Follow up with PCP for further evaluation and management of hypertension Return or go to the ED if you have any new or worsening symptoms such as vision changes, fatigue, dizziness, chest pain, shortness of breath, nausea, swelling in your hands or feet, urinary symptoms, etc..[name]

## 2018-08-18 ENCOUNTER — Telehealth: Payer: Self-pay

## 2018-08-18 ENCOUNTER — Telehealth: Payer: Self-pay | Admitting: Infectious Diseases

## 2018-08-18 NOTE — Telephone Encounter (Signed)
COVID-19 Pre-Screening Questions: ° °Do you currently have a fever (>100 °F), chills or unexplained body aches? No  ° °Are you currently experiencing new cough, shortness of breath, sore throat, runny nose? No  °•  °Have you recently travelled outside the state of [place] in the last 14 days? No  °•  °Have you been in contact with someone that is currently pending confirmation of Covid19 testing or has been confirmed to have the Covid19 virus?  No  °

## 2018-08-18 NOTE — Telephone Encounter (Signed)
Patient called office today requesting a prescription for pain medication. Patient states that the left side of his back has been hurting for three days; states that the back pain began one day after work. Has tried taking Tylenol and ibuprofen, but has no relief.  Advised patient to contact pcp or go to urgent care for evaluation. Verbalized to patient to continue to take tylenol/ ibuprofen, and to try using a warm compress on back. Appointment is scheduled for 08/22/18 for HIV follow up. [name], [place]

## 2018-08-22 ENCOUNTER — Other Ambulatory Visit (HOSPITAL_COMMUNITY)
Admission: RE | Admit: 2018-08-22 | Discharge: 2018-08-22 | Disposition: A | Discharge status: Home / Self Care | Payer: Medicare HMO | Source: Ambulatory Visit | Attending: Infectious Diseases | Admitting: Infectious Diseases

## 2018-08-22 ENCOUNTER — Ambulatory Visit (INDEPENDENT_AMBULATORY_CARE_PROVIDER_SITE_OTHER): Payer: Medicare HMO | Admitting: Infectious Diseases

## 2018-08-22 ENCOUNTER — Other Ambulatory Visit: Payer: Self-pay

## 2018-08-22 ENCOUNTER — Encounter: Payer: Self-pay | Admitting: Infectious Diseases

## 2018-08-22 VITALS — Wt 171.0 lb

## 2018-08-22 DIAGNOSIS — R7303 Prediabetes: Secondary | ICD-10-CM | POA: Diagnosis not present

## 2018-08-22 DIAGNOSIS — Z113 Encounter for screening for infections with a predominantly sexual mode of transmission: Secondary | ICD-10-CM | POA: Diagnosis not present

## 2018-08-22 DIAGNOSIS — B20 Human immunodeficiency virus [HIV] disease: Secondary | ICD-10-CM | POA: Diagnosis not present

## 2018-08-22 DIAGNOSIS — S39012A Strain of muscle, fascia and tendon of lower back, initial encounter: Secondary | ICD-10-CM | POA: Diagnosis not present

## 2018-08-22 DIAGNOSIS — R69 Illness, unspecified: Secondary | ICD-10-CM | POA: Diagnosis not present

## 2018-08-22 MED ORDER — IBUPROFEN 600 MG PO TABS: 600.0000 mg | ORAL_TABLET | Freq: Four times a day (QID) | ORAL | 0 refills | Status: DC | PRN

## 2018-08-22 NOTE — Progress Notes (Signed)
RFV: follow up for hiv disease  Patient ID: Jamie Clark, male   DOB: [birth year], 68 y.o.   MRN: [id number]   HPI Jamie Clark is a 68yo M with hiv disease, CD 4 count of 770/VL undetectable on Triumeq which he has been taking routinely with good adherence.   Unfortunately he has lost his job working at [employer].  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  He wears steel toed shoes on concrete floors and works long 12-hour shifts.  He has had trouble with back pain for the last 2 weeks.  He has been using icy hot and Tylenol.  The Tylenol does help with the pain for short while but comes right back.  He notices it in the shower the warm heat on his back feels very relaxing and helps with the pain.  He is using a low back brace at work.   Outpatient Encounter Medications as of 08/22/2018  Medication Sig  . hydrochlorothiazide (HYDRODIURIL) 25 MG tablet Take 1 tablet (25 mg total) by mouth daily.  [name] lisinopril (ZESTRIL) 10 MG tablet Take 1 tablet (10 mg total) by mouth daily.  . TRIUMEQ 600-50-300 MG tablet TAKE ONE TABLET BY MOUTH EVERY DAY  . ibuprofen (ADVIL) 600 MG tablet Take 1 tablet (600 mg total) by mouth every 6 (six) hours as needed.   No facility-administered encounter medications on file as of 08/22/2018.      Patient Active Problem List   Diagnosis Date Noted  . Lumbar strain, initial encounter 08/22/2018  . Prediabetes 08/22/2018  . Screen for STD (sexually transmitted disease) 08/22/2018  . Hyperglycemia 04/30/2014  . Tinea pedis of left foot 10/23/2013  . INSOMNIA 04/14/2010  . Dyslipidemia 01/17/2009  . HTN (hypertension) 01/17/2009  . HIV infection (HCC) 12/18/2008  . Chronic viral hepatitis C (HCC) 07/13/2007     Health Maintenance Due  Topic Date Due  . TETANUS/TDAP  01/22/1971  . COLONOSCOPY  01/21/2002     Review of Systems Low back pain is described up in HPI.  Otherwise 12 point ros is negative    Physical Exam  Wt 171 lb (77.6 kg)   BMI 26.78 kg/m    Physical Exam  Constitutional: He is oriented to person, place, and time. He appears well-developed and well-nourished. No distress.  HENT:  Mouth/Throat: Oropharynx is clear and moist. No oropharyngeal exudate.  Cardiovascular: Normal rate, regular rhythm and normal heart sounds. Exam reveals no gallop and no friction rub.  No murmur heard.  Pulmonary/Chest: Effort normal and breath sounds normal. No respiratory distress. He has no wheezes.  Abdominal: Soft. Bowel sounds are normal. He exhibits no distension. There is no tenderness.  Lymphadenopathy: He has no cervical adenopathy.  Musculoskeletal: Tenderness to palpation over the left lower back musculature.  He is nontender over any bony prominence. Neurological: He is alert and oriented to person, place, and time.  Skin: Skin is warm and dry. Macerated areas c/w tinea  Psychiatric: He has a normal mood and affect. His behavior is normal.    Lab Results  Component Value Date   CD4TCELL 42 04/27/2018   Lab Results  Component Value Date   CD4TABS 750 04/27/2018   CD4TABS 670 12/07/2017   CD4TABS 800 09/08/2017   Lab Results  Component Value Date   HIV1RNAQUANT 23 (H) 04/27/2018   Lab Results  Component Value Date   HEPBSAB NEG 05/06/2010   Lab Results  Component  Value Date   LABRPR NON-REACTIVE 04/27/2018    CBC Lab Results  Component Value Date   WBC 4.6 04/27/2018   RBC 4.50 04/27/2018   HGB 14.3 04/27/2018   HCT 41.4 04/27/2018   PLT 264 04/27/2018   MCV 92.0 04/27/2018   MCH 31.8 04/27/2018   MCHC 34.5 04/27/2018   RDW 13.1 04/27/2018   LYMPHSABS 1,826 04/27/2018   MONOABS 0.3 09/14/2017   EOSABS 138 04/27/2018    BMET Lab Results  Component Value Date   NA 140 04/27/2018   K 4.0 04/27/2018   CL 109 04/27/2018   CO2 21 04/27/2018   GLUCOSE 96 04/27/2018   BUN 13 04/27/2018   CREATININE 1.14 04/27/2018   CALCIUM 9.0 04/27/2018    GFRNONAA 67 04/27/2018   GFRAA 77 04/27/2018    Assessment and Plan Problem List Items Addressed This Visit      Unprioritized   HIV infection (HCC) - Primary    Jamie BeckersWalter is doing well and has been for some time now on Triumeq.  I reviewed his most recent lab work with him and congratulated him on good results.  We will continue Triumeq once daily.  Aside from hypertension has no cardiovascular risk factors that would be concerning for abacavir use.  We will check viral load today. He is requesting STD screening today we will screen with RPR and urine cytology based on his risk. Condoms provided today.      Relevant Orders   HIV-1 RNA quant-no reflex-bld   Lumbar strain, initial encounter    No sudden mechanism of injury but he has had repetitive use and motion with regards to twisting lifting and turning as well as long shift standing on concrete floor.  I encouraged him to continue using his lumbar support belt.  He can continue to use Tylenol for treatment but I told him that for at least 5 to 7 days to use a course of continuous NSAIDs like ibuprofen/Motrin/Aleve.  I will prescribe him 600 mg Motrin every 8 hours prn today.  Advised to take with food and with no other NSAIDs.  Okay to continue Tylenol use in between doses.  Encouraged rest warm heat applications and proper body lifting mechanics.  We can refer him to sports medicine for further assessment/treatment.      Relevant Orders   AMB referral to sports medicine   Prediabetes    He has had prediabetes in the last 2 screens.  He has a family history of diabetes.  We will repeat his A1c today.  We discussed necessary dietary modifications required to reduce this risk.  He has been on metformin in the past.  He will work on reducing his sugar and carbohydrate intake.  Spent time discussing the natural progression of diabetes if left untreated and the risks associated including but not limited to neuropathy, infections, kidney disease  including dialysis need, blindness.      Relevant Orders   Hemoglobin A1c   Screen for STD (sexually transmitted disease)    Screening with RPR and urine gonorrhea chlamydia.      Relevant Orders   RPR   Urine cytology ancillary only     Return in about 3 months (around 11/22/2018).  Rexene AlbertsStephanie Kyli Sorter, MSN, NP-C [facility]  [email or web address] Pager: [phone] Office: [phone] [facility] Main Line: [phone]

## 2018-08-22 NOTE — Assessment & Plan Note (Addendum)
No sudden mechanism of injury but he has had repetitive use and motion with regards to twisting lifting and turning as well as long shift standing on concrete floor.  I encouraged him to continue using his lumbar support belt.  He can continue to use Tylenol for treatment but I told him that for at least 5 to 7 days to use a course of continuous NSAIDs like ibuprofen/Motrin/Aleve.  I will prescribe him 600 mg Motrin every 8 hours prn today.  Advised to take with food and with no other NSAIDs.  Okay to continue Tylenol use in between doses.  Encouraged rest warm heat applications and proper body lifting mechanics.  We can refer him to sports medicine for further assessment/treatment.

## 2018-08-22 NOTE — Patient Instructions (Addendum)
Please continue taking her Triumeq once a day as you are doing now.  Based on your blood work over the last 6 months you have had excellent control over your HIV.  Your viral load has been undetectable for some time now.  We will repeat this today.   Please sign up with MyChart to access your labs and set up email communication with our clinic for non-urgent medical concerns.   We will repeat your diabetes test to make sure you do not need to start your metformin back.  We will also help get you referred to a primary care provider to help take good care of your other medical needs.  For your lower back I suspect that you have a muscle strain.  Please see the information below to complement what we talked about.  For the pain I want you to start taking either ibuprofen 2 to 3 tablets every 8 hours or Aleve 1 to 2 tablets every 12 hours.  Try taking this routinely for 3-5 days then drop back to as needed. Please take with food.   It is safe to take Tylenol in between if you need to continue to do so for pain but the ibuprofen or Aleve will be more helpful to help the inflammation.  Moist heat applications multiple times a day will be helpful for you as well.  There is a topical medication available in the pharmacy now called Voltaren cream.  This will help as well with inflammation to the area.  Please return to see Dr. Drue Second in 3 months.   Low Back Strain  A strain is a stretch or tear in a muscle or the strong cords of tissue that attach muscle to bone (tendons). Strains of the lower back (lumbar spine) are a common cause of low back pain. A strain occurs when muscles or tendons are torn or are stretched beyond their limits. The muscles may become inflamed, resulting in pain and sudden muscle tightening (spasms). A strain can happen suddenly due to an injury (trauma), or it can develop gradually due to overuse. There are three types of strains:  Grade 1 is a mild strain involving a minor  tear of the muscle fibers or tendons. This may cause some pain but no loss of muscle strength.  Grade 2 is a moderate strain involving a partial tear of the muscle fibers or tendons. This causes more severe pain and some loss of muscle strength.  Grade 3 is a severe strain involving a complete tear of the muscle or tendon. This causes severe pain and complete or nearly complete loss of muscle strength. What are the causes? This condition may be caused by:  Trauma, such as a fall or a hit to the body.  Twisting or overstretching the back. This may result from doing activities that require a lot of energy, such as lifting heavy objects. What increases the risk? The following factors may increase your risk of getting this condition:  Playing contact sports.  Participating in sports or activities that put excessive stress on the back and require a lot of bending and twisting, including: ? Lifting weights or heavy objects. ? Gymnastics. ? Soccer. ? Figure skating. ? Snowboarding.  Being overweight or obese.  Having poor strength and flexibility. What are the signs or symptoms? Symptoms of this condition may include:  Sharp or dull pain in the lower back that does not go away. Pain may extend to the buttocks.  Stiffness.  Limited range of  motion.  Inability to stand up straight due to stiffness or pain.  Muscle spasms. How is this diagnosed? This condition may be diagnosed based on:  Your symptoms.  Your medical history.  A physical exam. ? Your health care provider may push on certain areas of your back to determine the source of your pain. ? You may be asked to bend forward, backward, and side to side to assess the severity of your pain and your range of motion.  Imaging tests, such as: ? X-rays. ? MRI. How is this treated? Treatment for this condition may include:  Applying heat and cold to the affected area.  Medicines to help relieve pain and to relax your  muscles (muscle relaxants).  NSAIDs to help reduce swelling and discomfort.  Physical therapy. When your symptoms improve, it is important to gradually return to your normal routine as soon as possible to reduce pain, avoid stiffness, and avoid loss of muscle strength. Generally, symptoms should improve within 6 weeks of treatment. However, recovery time varies. Follow these instructions at home: Managing pain, stiffness, and swelling  If directed, apply ice to the injured area during the first 24 hours after your injury. ? Put ice in a plastic bag. ? Place a towel between your skin and the bag. ? Leave the ice on for 20 minutes, 2-3 times a day.  If directed, apply heat to the affected area as often as told by your health care provider. Use the heat source that your health care provider recommends, such as a moist heat pack or a heating pad. ? Place a towel between your skin and the heat source. ? Leave the heat on for 20-30 minutes. ? Remove the heat if your skin turns bright red. This is especially important if you are unable to feel pain, heat, or cold. You may have a greater risk of getting burned. Activity  Rest and return to your normal activities as told by your health care provider. Ask your health care provider what activities are safe for you.  Avoid activities that take a lot of effort (are strenuous) for as long as told by your health care provider.  Do exercises as told by your health care provider. General instructions   Take over-the-counter and prescription medicines only as told by your health care provider.  If you have questions or concerns about safety while taking pain medicine, talk with your health care provider.  Do not drive or operate heavy machinery until you know how your pain medicine affects you.  Do not use any tobacco products, such as cigarettes, chewing tobacco, and e-cigarettes. Tobacco can delay bone healing. If you need help quitting, ask your  health care provider.  Keep all follow-up visits as told by your health care provider. This is important. How is this prevented?               Warm up and stretch before being active.  Cool down and stretch after being active.  Give your body time to rest between periods of activity.  Avoid: ? Being physically inactive for long periods at a time. ? Exercising or playing sports when you are tired or in pain.  Use correct form when playing sports and lifting heavy objects.  Use good posture when sitting and standing.  Maintain a healthy weight.  Sleep on a mattress with medium firmness to support your back.  Make sure to use equipment that fits you, including shoes that fit well.  Be safe  and responsible while being active to avoid falls.  Do at least 150 minutes of moderate-intensity exercise each week, such as brisk walking or water aerobics. Try a form of exercise that takes stress off your back, such as swimming or stationary cycling.  Maintain physical fitness, including: ? Strength. ? Flexibility. ? Cardiovascular fitness. ? Endurance. Contact a health care provider if:  Your back pain does not improve after 6 weeks of treatment.  Your symptoms get worse. Get help right away if:  Your back pain is severe.  You are unable to stand or walk.  You develop pain in your legs.  You develop weakness in your buttocks or legs.  You have difficulty controlling when you urinate or when you have a bowel movement. This information is not intended to replace advice given to you by your health care provider. Make sure you discuss any questions you have with your health care provider. Document Released: 03/09/2005 Document Revised: 05/04/2016 Document Reviewed: 12/19/2014 Elsevier Interactive Patient Education  2019 [employer]

## 2018-08-22 NOTE — Assessment & Plan Note (Signed)
Jamie Clark is doing well and has been for some time now on Triumeq.  I reviewed his most recent lab work with him and congratulated him on good results.  We will continue Triumeq once daily.  Aside from hypertension has no cardiovascular risk factors that would be concerning for abacavir use.  We will check viral load today. He is requesting STD screening today we will screen with RPR and urine cytology based on his risk. Condoms provided today.

## 2018-08-22 NOTE — Assessment & Plan Note (Signed)
Screening with RPR and urine gonorrhea chlamydia.

## 2018-08-22 NOTE — Assessment & Plan Note (Signed)
He has had prediabetes in the last 2 screens.  He has a family history of diabetes.  We will repeat his A1c today.  We discussed necessary dietary modifications required to reduce this risk.  He has been on metformin in the past.  He will work on reducing his sugar and carbohydrate intake.  Spent time discussing the natural progression of diabetes if left untreated and the risks associated including but not limited to neuropathy, infections, kidney disease including dialysis need, blindness.

## 2018-08-23 ENCOUNTER — Other Ambulatory Visit: Payer: Self-pay | Admitting: Internal Medicine

## 2018-08-23 DIAGNOSIS — I10 Essential (primary) hypertension: Secondary | ICD-10-CM

## 2018-08-23 LAB — URINE CYTOLOGY ANCILLARY ONLY
Chlamydia: NEGATIVE
Neisseria Gonorrhea: NEGATIVE

## 2018-08-29 ENCOUNTER — Other Ambulatory Visit: Payer: Self-pay

## 2018-08-29 ENCOUNTER — Ambulatory Visit (INDEPENDENT_AMBULATORY_CARE_PROVIDER_SITE_OTHER): Payer: Medicare HMO | Admitting: Internal Medicine

## 2018-08-29 VITALS — BP 159/96 | HR 78 | Temp 98.4°F | Ht 67.0 in | Wt 172.6 lb

## 2018-08-29 DIAGNOSIS — R7303 Prediabetes: Secondary | ICD-10-CM | POA: Diagnosis not present

## 2018-08-29 DIAGNOSIS — E785 Hyperlipidemia, unspecified: Secondary | ICD-10-CM | POA: Diagnosis not present

## 2018-08-29 DIAGNOSIS — Z79899 Other long term (current) drug therapy: Secondary | ICD-10-CM | POA: Diagnosis not present

## 2018-08-29 DIAGNOSIS — Z21 Asymptomatic human immunodeficiency virus [HIV] infection status: Secondary | ICD-10-CM

## 2018-08-29 DIAGNOSIS — B182 Chronic viral hepatitis C: Secondary | ICD-10-CM | POA: Diagnosis not present

## 2018-08-29 DIAGNOSIS — B353 Tinea pedis: Secondary | ICD-10-CM

## 2018-08-29 DIAGNOSIS — I10 Essential (primary) hypertension: Secondary | ICD-10-CM | POA: Diagnosis not present

## 2018-08-29 DIAGNOSIS — M545 Low back pain: Secondary | ICD-10-CM | POA: Diagnosis not present

## 2018-08-29 DIAGNOSIS — R69 Illness, unspecified: Secondary | ICD-10-CM | POA: Diagnosis not present

## 2018-08-29 MED ORDER — ATORVASTATIN CALCIUM 40 MG PO TABS: 40.0000 mg | ORAL_TABLET | Freq: Every day | ORAL | 1 refills | Status: DC

## 2018-08-29 NOTE — Assessment & Plan Note (Signed)
Chronic. Only tried OTC foot powder with no improvement.   - OTC lotrimin cream, use bid for 4 weeks - change socks twice a day

## 2018-08-29 NOTE — Assessment & Plan Note (Addendum)
Elevated but he did not take his bp meds this morning.denies h/a or blurry vision. Recent labs show normal renal function. On review of vitals, his bp seems chronically uncontrolled and I expect he needs medication adjustment. I have instructed him to check his BP at home after he's taken his meds and call us with the results  - continue lisinopril 10mg , hctz 25mg  - f/u with pcp, increase lisinopril to 20mg  if bp remains elevated

## 2018-08-29 NOTE — Progress Notes (Signed)
   CC: establish care  HPI:  [name] is a 68 y.o. male with well controlled HIV, HTN, pre-diabetes, chronic hep C who presents to establish care. He also endorses low back pain and athlete's foot.   Please see encounter tab for full details of HPI    Past Medical History:  Diagnosis Date  . Diabetes mellitus without complication ([place])   . Hepatitis C, chronic ([place])   . HIV positive ([place])   . Hypertension     Physical Exam:  Vitals:   08/29/18 0942  BP: (!) 159/96  Pulse: 78  Temp: 98.4 F (36.9 C)  TempSrc: Oral  SpO2: 97%  Weight: 172 lb 9.6 oz (78.3 kg)   Gen: well appearing, NAD Cardaic: RRR, no mrg Pulm: CTAB Ext: white plaque present between toes on bilateral feet, no open wounds or discharge.   Assessment & Plan:   See Encounters Tab for problem based charting.  Patient discussed with Dr. Daryll Drown

## 2018-08-29 NOTE — Patient Instructions (Addendum)
It was nice seeing you today. Thank you for choosing [facility] for your Primary Care.   Today we talked about:   1) High blood pressure  - continue taking your medicine  - check your blood pressure at home or [employer] at least one hour after you take your medicines. Call the clinic and let them know what it is.    2) High Cholesterol: Please read the information below about what foods you should be eating. We will also arrange an appointment with our dietician, Lupita LeashDonna, to discuss your diet in more detail. We can recheck your cholesterol in 3 months and if still elevated, start you on a medication at that time.  3) Back Pain:   - get more supportive shoes/inserts  - try a back brace while at work   - do core strengthening exercises at home   4) Athlete's Foot:   - buy an antifungal foot cream at [employer] called Lotrimin  - use this cream twice a day for 4 weeks   FOLLOW-UP INSTRUCTIONS When: 1-2 months with new PCP For: blood pressure What to bring: all medications   Please contact the clinic if you have any problems, or need to be seen sooner.    Cholesterol Content in Foods Cholesterol is a waxy, fat-like substance that helps to carry fat in the blood. The body needs cholesterol in small amounts, but too much cholesterol can cause damage to the arteries and heart. Most people should eat less than 200 milligrams (mg) of cholesterol a day. Foods with cholesterol  Cholesterol is found in animal-based foods, such as meat, seafood, and dairy. Generally, low-fat dairy and lean meats have less cholesterol than full-fat dairy and fatty meats. The milligrams of cholesterol per serving (mg per serving) of common cholesterol-containing foods are listed below. Meat and other proteins  Egg - one large whole egg has 186 mg.  Veal shank - 4 oz has 141 mg.  Lean ground Mal[place]key (93% lean) - 4 oz has 118 mg.  Fat-trimmed lamb loin - 4 oz has 106 mg.  Lean ground beef (90% lean)  - 4 oz has 100 mg.  Lobster - 3.5 oz has 90 mg.  Pork loin chops - 4 oz has 86 mg.  Canned salmon - 3.5 oz has 83 mg.  Fat-trimmed beef top loin - 4 oz has 78 mg.  Frankfurter - 1 frank (3.5 oz) has 77 mg.  Crab - 3.5 oz has 71 mg.  Roasted chicken without skin, white meat - 4 oz has 66 mg.  Light bologna - 2 oz has 45 mg.  Deli-cut Malaw[place]urkey - 2 oz has 31 mg.  Canned tuna - 3.5 oz has 31 mg.  Bacon - 1 oz has 29 mg.  Oysters and mussels (raw) - 3.5 oz has 25 mg.  Mackerel - 1 oz has 22 mg.  Trout - 1 oz has 20 mg.  Pork sausage - 1 link (1 oz) has 17 mg.  Salmon - 1 oz has 16 mg.  Tilapia - 1 oz has 14 mg. Dairy  Soft-serve ice cream -  cup (4 oz) has 103 mg.  Whole-milk yogurt - 1 cup (8 oz) has 29 mg.  Cheddar cheese - 1 oz has 28 mg.  American cheese - 1 oz has 28 mg.  Whole milk - 1 cup (8 oz) has 23 mg.  2% milk - 1 cup (8 oz) has 18 mg.  Cream cheese - 1 tablespoon (Tbsp) has 15  mg.  Cottage cheese -  cup (4 oz) has 14 mg.  Low-fat (1%) milk - 1 cup (8 oz) has 10 mg.  Sour cream - 1 Tbsp has 8.5 mg.  Low-fat yogurt - 1 cup (8 oz) has 8 mg.  Nonfat Greek yogurt - 1 cup (8 oz) has 7 mg.  Half-and-half cream - 1 Tbsp has 5 mg. Fats and oils  Cod liver oil - 1 tablespoon (Tbsp) has 82 mg.  Butter - 1 Tbsp has 15 mg.  Lard - 1 Tbsp has 14 mg.  Bacon grease - 1 Tbsp has 14 mg.  Mayonnaise - 1 Tbsp has 5-10 mg.  Margarine - 1 Tbsp has 3-10 mg. Exact amounts of cholesterol in these foods may vary depending on specific ingredients and brands. Foods without cholesterol Most plant-based foods do not have cholesterol unless you combine them with a food that has cholesterol. Foods without cholesterol include:  Grains and cereals.  Vegetables.  Fruits.  Vegetable oils, such as olive, canola, and sunflower oil.  Legumes, such as peas, beans, and lentils.  Nuts and seeds.  Egg whites. Summary  The body needs cholesterol in small  amounts, but too much cholesterol can cause damage to the arteries and heart.  Most people should eat less than 200 milligrams (mg) of cholesterol a day. This information is not intended to replace advice given to you by your health care provider. Make sure you discuss any questions you have with your health care provider. Document Released: 11/03/2016 Document Revised: 11/03/2016 Document Reviewed: 11/03/2016 Elsevier Interactive Patient Education  2019 [place] DASH stands for "Dietary Approaches to Stop Hypertension." The DASH eating plan is a healthy eating plan that has been shown to reduce high blood pressure (hypertension). It may also reduce your risk for type 2 diabetes, heart disease, and stroke. The DASH eating plan may also help with weight loss. What are tips for following this plan?  General guidelines  Avoid eating more than 2,300 mg (milligrams) of salt (sodium) a day. If you have hypertension, you may need to reduce your sodium intake to 1,500 mg a day.  Limit alcohol intake to no more than 1 drink a day for nonpregnant women and 2 drinks a day for men. One drink equals 12 oz of beer, 5 oz of wine, or 1 oz of hard liquor.  Work with your health care provider to maintain a healthy body weight or to lose weight. Ask what an ideal weight is for you.  Get at least 30 minutes of exercise that causes your heart to beat faster (aerobic exercise) most days of the week. Activities may include walking, swimming, or biking.  Work with your health care provider or diet and nutrition specialist (dietitian) to adjust your eating plan to your individual calorie needs. Reading food labels   Check food labels for the amount of sodium per serving. Choose foods with less than 5 percent of the Daily Value of sodium. Generally, foods with less than 300 mg of sodium per serving fit into this eating plan.  To find whole grains, look for the word "whole" as the first  word in the ingredient list. Shopping  Buy products labeled as "low-sodium" or "no salt added."  Buy fresh foods. Avoid canned foods and premade or frozen meals. Cooking  Avoid adding salt when cooking. Use salt-free seasonings or herbs instead of table salt or sea salt. Check with your health care provider or pharmacist before  using salt substitutes.  Do not fry foods. Cook foods using healthy methods such as baking, boiling, grilling, and broiling instead.  Cook with heart-healthy oils, such as olive, canola, soybean, or sunflower oil. Meal planning  Eat a balanced diet that includes: ? 5 or more servings of fruits and vegetables each day. At each meal, try to fill half of your plate with fruits and vegetables. ? Up to 6-8 servings of whole grains each day. ? Less than 6 oz of lean meat, poultry, or fish each day. A 3-oz serving of meat is about the same size as a deck of cards. One egg equals 1 oz. ? 2 servings of low-fat dairy each day. ? A serving of nuts, seeds, or beans 5 times each week. ? Heart-healthy fats. Healthy fats called Omega-3 fatty acids are found in foods such as flaxseeds and coldwater fish, like sardines, salmon, and mackerel.  Limit how much you eat of the following: ? Canned or prepackaged foods. ? Food that is high in trans fat, such as fried foods. ? Food that is high in saturated fat, such as fatty meat. ? Sweets, desserts, sugary drinks, and other foods with added sugar. ? Full-fat dairy products.  Do not salt foods before eating.  Try to eat at least 2 vegetarian meals each week.  Eat more home-cooked food and less restaurant, buffet, and fast food.  When eating at a restaurant, ask that your food be prepared with less salt or no salt, if possible. What foods are recommended? The items listed may not be a complete list. Talk with your dietitian about what dietary choices are best for you. Grains Whole-grain or whole-wheat bread. Whole-grain or  whole-wheat pasta. Brown rice. Orpah Cobbatmeal. Quinoa. Bulgur. Whole-grain and low-sodium cereals. Pita bread. Low-fat, low-sodium crackers. Whole-wheat flour tortillas. Vegetables Fresh or frozen vegetables (raw, steamed, roasted, or grilled). Low-sodium or reduced-sodium tomato and vegetable juice. Low-sodium or reduced-sodium tomato sauce and tomato paste. Low-sodium or reduced-sodium canned vegetables. Fruits All fresh, dried, or frozen fruit. Canned fruit in natural juice (without added sugar). Meat and other protein foods Skinless chicken or Malawiturkey. Ground chicken or Mal[place]iturkey. Pork with fat trimmed off. Fish and seafood. Egg whites. Dried beans, peas, or lentils. Unsalted nuts, nut butters, and seeds. Unsalted canned beans. Lean cuts of beef with fat trimmed off. Low-sodium, lean deli meat. Dairy Low-fat (1%) or fat-free (skim) milk. Fat-free, low-fat, or reduced-fat cheeses. Nonfat, low-sodium ricotta or cottage cheese. Low-fat or nonfat yogurt. Low-fat, low-sodium cheese. Fats and oils Soft margarine without trans fats. Vegetable oil. Low-fat, reduced-fat, or light mayonnaise and salad dressings (reduced-sodium). Canola, safflower, olive, soybean, and sunflower oils. Avocado. Seasoning and other foods Herbs. Spices. Seasoning mixes without salt. Unsalted popcorn and pretzels. Fat-free sweets. What foods are not recommended? The items listed may not be a complete list. Talk with your dietitian about what dietary choices are best for you. Grains Baked goods made with fat, such as croissants, muffins, or some breads. Dry pasta or rice meal packs. Vegetables Creamed or fried vegetables. Vegetables in a cheese sauce. Regular canned vegetables (not low-sodium or reduced-sodium). Regular canned tomato sauce and paste (not low-sodium or reduced-sodium). Regular tomato and vegetable juice (not low-sodium or reduced-sodium). Rosita FirePickles. Olives. Fruits Canned fruit in a light or heavy syrup. Fried fruit. Fruit  in cream or butter sauce. Meat and other protein foods Fatty cuts of meat. Ribs. Fried meat. Tomasa BlaseBacon. Sausage. Bologna and other processed lunch meats. Salami. Fatback. Hotdogs. Bratwurst. Salted nuts and  seeds. Canned beans with added salt. Canned or smoked fish. Whole eggs or egg yolks. Chicken or Malaw[place]urkey with skin. Dairy Whole or 2% milk, cream, and half-and-half. Whole or full-fat cream cheese. Whole-fat or sweetened yogurt. Full-fat cheese. Nondairy creamers. Whipped toppings. Processed cheese and cheese spreads. Fats and oils Butter. Stick margarine. Lard. Shortening. Ghee. Bacon fat. Tropical oils, such as coconut, palm kernel, or palm oil. Seasoning and other foods Salted popcorn and pretzels. Onion salt, garlic salt, seasoned salt, table salt, and sea salt. Worcestershire sauce. Tartar sauce. Barbecue sauce. Teriyaki sauce. Soy sauce, including reduced-sodium. Steak sauce. Canned and packaged gravies. Fish sauce. Oyster sauce. Cocktail sauce. Horseradish that you find on the shelf. Ketchup. Mustard. Meat flavorings and tenderizers. Bouillon cubes. Hot sauce and Tabasco sauce. Premade or packaged marinades. Premade or packaged taco seasonings. Relishes. Regular salad dressings. Where to find more information:  National Heart, Lung, and Blood Institute: PopSteam.iswww.nhlbi.nih.gov  American Heart Association: www.heart.org Summary  The DASH eating plan is a healthy eating plan that has been shown to reduce high blood pressure (hypertension). It may also reduce your risk for type 2 diabetes, heart disease, and stroke.  With the DASH eating plan, you should limit salt (sodium) intake to 2,300 mg a day. If you have hypertension, you may need to reduce your sodium intake to 1,500 mg a day.  When on the DASH eating plan, aim to eat more fresh fruits and vegetables, whole grains, lean proteins, low-fat dairy, and heart-healthy fats.  Work with your health care provider or diet and nutrition specialist  (dietitian) to adjust your eating plan to your individual calorie needs. This information is not intended to replace advice given to you by your health care provider. Make sure you discuss any questions you have with your health care provider. Document Released: 02/26/2011 Document Revised: 03/02/2016 Document Reviewed: 03/02/2016 Elsevier Interactive Patient Education  2019 [employer]

## 2018-08-29 NOTE — Assessment & Plan Note (Signed)
Uncontrolled. Recent lipid panel below. Discussed dietary modifications. 64yr ASCVD risk is 15%. Also has family history of brother with stroke at age 67.   - start atorvastatin 40mg  qd - MNT referral to [facility] to further discuss dietary changes   Lipid Panel     Component Value Date/Time   CHOL 175 04/27/2018 0946   CHOL 170 03/30/2013   TRIG 85 04/27/2018 0946   TRIG 148 03/30/2013   HDL 37 (L) 04/27/2018 0946   CHOLHDL 4.7 04/27/2018 0946   VLDL 37 (H) 06/11/2015 1135   LDLCALC 120 (H) 04/27/2018 0946   LDLCALC 93 10/12/2012

## 2018-08-30 ENCOUNTER — Telehealth: Payer: Self-pay | Admitting: Dietician

## 2018-08-30 LAB — HEMOGLOBIN A1C
Hgb A1c MFr Bld: 5.7 % of total Hgb — ABNORMAL HIGH (ref <5.7)
Mean Plasma Glucose: 117 (calc)
eAG (mmol/L): 6.5 (calc)

## 2018-08-30 LAB — HIV-1 RNA QUANT-NO REFLEX-BLD
HIV 1 RNA Quant: 21 copies/mL — ABNORMAL HIGH
HIV-1 RNA Quant, Log: 1.32 Log copies/mL — ABNORMAL HIGH

## 2018-08-30 LAB — RPR: RPR Ser Ql: NONREACTIVE

## 2018-08-30 NOTE — Telephone Encounter (Addendum)
Left voicemail for return call Debera Lat, RD 08/30/2018 3:17 PM. Left voicemail for return call Debera Lat, RD 09/02/2018 3:15 PM.

## 2018-08-30 NOTE — Progress Notes (Signed)
Internal Medicine Clinic Attending  Case discussed with Dr. Vogel  at the time of the visit.  We reviewed the resident's history and exam and pertinent patient test results.  I agree with the assessment, diagnosis, and plan of care documented in the resident's note.  

## 2018-09-05 ENCOUNTER — Encounter: Payer: Self-pay | Admitting: Dietician

## 2018-09-06 ENCOUNTER — Encounter: Payer: Self-pay | Admitting: *Deleted

## 2018-09-07 ENCOUNTER — Encounter: Payer: Self-pay | Admitting: Family Medicine
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
## 2018-09-15 ENCOUNTER — Ambulatory Visit (HOSPITAL_COMMUNITY)
Admission: EM | Admit: 2018-09-15 | Discharge: 2018-09-15 | Disposition: A | Discharge status: Home / Self Care | Payer: Medicare HMO | Attending: Emergency Medicine | Admitting: Emergency Medicine

## 2018-09-15 ENCOUNTER — Other Ambulatory Visit: Payer: Self-pay

## 2018-09-15 ENCOUNTER — Encounter (HOSPITAL_COMMUNITY): Payer: Self-pay | Admitting: Emergency Medicine

## 2018-09-15 DIAGNOSIS — R51 Headache: Secondary | ICD-10-CM

## 2018-09-15 DIAGNOSIS — R519 Headache, unspecified: Secondary | ICD-10-CM

## 2018-09-15 MED ORDER — DEXAMETHASONE SODIUM PHOSPHATE 10 MG/ML IJ SOLN
10.0000 mg | Freq: Once | INTRAMUSCULAR | Status: AC
  Administered 2018-09-15: 10 mg via INTRAMUSCULAR

## 2018-09-15 MED ORDER — KETOROLAC TROMETHAMINE 30 MG/ML IJ SOLN
INTRAMUSCULAR | Status: AC
  Filled 2018-09-15: qty 1

## 2018-09-15 MED ORDER — KETOROLAC TROMETHAMINE 60 MG/2ML IM SOLN
30.0000 mg | Freq: Once | INTRAMUSCULAR | Status: AC
  Administered 2018-09-15: 30 mg via INTRAMUSCULAR

## 2018-09-15 MED ORDER — DEXAMETHASONE SODIUM PHOSPHATE 10 MG/ML IJ SOLN
INTRAMUSCULAR | Status: AC
  Filled 2018-09-15: qty 1

## 2018-09-15 MED ORDER — ONDANSETRON HCL 4 MG PO TABS: 4.0000 mg | ORAL_TABLET | Freq: Four times a day (QID) | ORAL | 0 refills | Status: DC

## 2018-09-15 NOTE — ED Triage Notes (Signed)
Headache started Tuesday night at work, patient has nausea with headache.  Overall, symptoms have improved.  Patient touches forehead as location of pain.  General body weakness.  No specific weakness  Patient reports his work environment is exceptionally hot and would like a work note requesting a cooler environment.  Patient works at [employer], air conditioner is not working   Patient is also asking questions about skin tag on left inner thigh

## 2018-09-15 NOTE — Discharge Instructions (Addendum)
Important that you stay hydrated, drink water throughout the day: Avoid sodas, teas, juices. Continue ibuprofen as needed for headaches, as well as Zofran sublingual should nausea recur. If you develop fever, body aches, abdominal pain, diarrhea, cough, shortness of breath.
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
## 2018-10-05 NOTE — Addendum Note (Signed)
Addended by: Hulan Fray on: 10/05/2018 08:09 PM   Modules accepted: Orders

## 2018-10-20 ENCOUNTER — Encounter: Payer: Medicare HMO | Admitting: Internal Medicine

## 2018-10-20 ENCOUNTER — Encounter: Payer: Self-pay | Admitting: Internal Medicine

## 2018-10-20 NOTE — Assessment & Plan Note (Deleted)
Medications: Hydrochlorothiazide 25 mg daily 

## 2018-10-21 ENCOUNTER — Other Ambulatory Visit: Payer: Self-pay | Admitting: Internal Medicine

## 2018-10-21 DIAGNOSIS — B20 Human immunodeficiency virus [HIV] disease: Secondary | ICD-10-CM

## 2018-10-28 ENCOUNTER — Other Ambulatory Visit: Payer: Self-pay

## 2018-10-28 ENCOUNTER — Encounter (HOSPITAL_COMMUNITY): Payer: Self-pay | Admitting: Emergency Medicine

## 2018-10-28 ENCOUNTER — Ambulatory Visit (HOSPITAL_COMMUNITY)
Admission: EM | Admit: 2018-10-28 | Discharge: 2018-10-28 | Disposition: A | Discharge status: Home / Self Care | Payer: Medicare HMO | Attending: Family Medicine | Admitting: Family Medicine

## 2018-10-28 DIAGNOSIS — R110 Nausea: Secondary | ICD-10-CM | POA: Diagnosis not present

## 2018-10-28 DIAGNOSIS — R51 Headache: Secondary | ICD-10-CM | POA: Diagnosis not present

## 2018-10-28 DIAGNOSIS — R519 Headache, unspecified: Secondary | ICD-10-CM

## 2018-10-28 MED ORDER — ACETAMINOPHEN 500 MG PO TABS: 500.0000 mg | ORAL_TABLET | Freq: Four times a day (QID) | ORAL | 0 refills | Status: DC | PRN

## 2018-10-28 MED ORDER — KETOROLAC TROMETHAMINE 30 MG/ML IJ SOLN
30.0000 mg | Freq: Once | INTRAMUSCULAR | Status: AC
  Administered 2018-10-28: 30 mg via INTRAMUSCULAR

## 2018-10-28 MED ORDER — KETOROLAC TROMETHAMINE 30 MG/ML IJ SOLN
INTRAMUSCULAR | Status: AC
  Filled 2018-10-28: qty 1

## 2018-10-28 MED ORDER — ONDANSETRON 4 MG PO TBDP
4.0000 mg | ORAL_TABLET | Freq: Once | ORAL | Status: AC
  Administered 2018-10-28: 4 mg via ORAL

## 2018-10-28 MED ORDER — ONDANSETRON 4 MG PO TBDP
ORAL_TABLET | ORAL | Status: AC
  Filled 2018-10-28: qty 1

## 2018-10-28 NOTE — ED Provider Notes (Signed)
[facility]    CSN: [id number] Arrival date & time: 10/28/18  1010     History   Chief Complaint Chief Complaint  Patient presents with  . Headache  . Nausea    HPI Jamie Clark is a 68 y.o. male.   Koleton Gonterman presents with complaints of headache and nausea. Started yesterday evening while at work. 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 States he gets minimal breaks to drink water. No head injury. Pain is similar to other headaches he has had. Has been treated for similar in the past, here. No vision change, no dizziness. Hasn't taken any medications for pain. Hasn't taken his BP medications today. No vomiting, still eating and drinking. No URI symptoms. No fevers. No body aches. Pain 4/10. No light, sound or movement sensitivity. History of DM, hep c, hiv, htn.     ROS per HPI, negative if not otherwise mentioned.      Past Medical History:  Diagnosis Date  . Diabetes mellitus without complication ([place])   . Hepatitis C, chronic ([place])   . HIV positive ([place])   . Hypertension     Patient Active Problem List   Diagnosis Date Noted  . Lumbar strain, initial encounter 08/22/2018  . Prediabetes 08/22/2018  . Screen for STD (sexually transmitted disease) 08/22/2018  . Tinea pedis of both feet 10/23/2013  . Dyslipidemia 01/17/2009  . HTN (hypertension) 01/17/2009  . HIV infection ([place]) 12/18/2008  . Chronic viral hepatitis C ([place]) 07/13/2007    History reviewed. No pertinent surgical history.     Home Medications    Prior to Admission medications   Medication Sig Start Date End Date Taking? Authorizing Provider  acetaminophen (TYLENOL) 500 MG tablet Take 1 tablet (500 mg total) by mouth every 6 (six) hours as needed. 10/28/18   Zigmund Gottron, NP  atorvastatin (LIPITOR) 40 MG tablet Take 1 tablet (40 mg total) by mouth daily. 08/29/18   Isabelle Course, MD  hydrochlorothiazide (HYDRODIURIL) 25 MG tablet Take 1 tablet (25 mg total) by mouth  daily. 07/29/18   Wurst, [place], PA-C  ibuprofen (ADVIL) 600 MG tablet Take 1 tablet (600 mg total) by mouth every 6 (six) hours as needed. 08/22/18   Lavaca Callas, NP  lisinopril (ZESTRIL) 10 MG tablet TAKE ONE Tablet BY MOUTH EVERY DAY 08/23/18   Carlyle Basques, MD  ondansetron (ZOFRAN) 4 MG tablet Take 1 tablet (4 mg total) by mouth every 6 (six) hours. 09/15/18   Hall-Potvin, [place], PA-C  TRIUMEQ 600-50-300 MG tablet TAKE ONE TABLET BY MOUTH EVERY DAY 10/21/18    Callas, NP    Family History Family History  Problem Relation Age of Onset  . Cancer Mother   . Hypertension Mother   . Cancer Father   . Hypertension Father     Social History Social History   Tobacco Use  . Smoking status: Light Tobacco Smoker    Packs/day: 0.15    Types: Cigarettes  . Smokeless tobacco: Never Used  . Tobacco comment: 1 a day  Substance Use Topics  . Alcohol use: No    Alcohol/week: 0.0 standard drinks  . Drug use: No     Allergies   Patient has no known allergies.   Review of Systems Review of Systems   Physical Exam Triage Vital Signs ED Triage Vitals  Enc Vitals Group     BP 10/28/18 1131 (!) 170/95     Pulse Rate 10/28/18 1131 73  Resp 10/28/18 1131 18     Temp 10/28/18 1131 97.6 F (36.4 C)     Temp Source 10/28/18 1131 Temporal     SpO2 10/28/18 1131 97 %     Weight --      Height --      Head Circumference --      Peak Flow --      Pain Score 10/28/18 1132 2     Pain Loc --      Pain Edu? --      Excl. in GC? --    No data found.  Updated Vital Signs BP (!) 172/97 (BP Location: Right Arm)   Pulse 73   Temp 97.6 F (36.4 C) (Temporal)   Resp 18   SpO2 97%    Physical Exam Constitutional:      Appearance: He is well-developed.  HENT:     Head: Normocephalic.  Eyes:     Extraocular Movements: Extraocular movements intact.  Neck:     Musculoskeletal: Normal range of motion.  Cardiovascular:     Rate and Rhythm: Normal rate and regular  rhythm.  Pulmonary:     Effort: Pulmonary effort is normal.     Breath sounds: Normal breath sounds.  Skin:    General: Skin is warm and dry.  Neurological:     Mental Status: He is alert and oriented to person, place, and time.     GCS: GCS eye subscore is 4. GCS verbal subscore is 5. GCS motor subscore is 6.     Cranial Nerves: No cranial nerve deficit or facial asymmetry.     Sensory: No sensory deficit.  Psychiatric:        Mood and Affect: Mood normal.        Speech: Speech normal.      UC Treatments / Results  Labs (all labs ordered are listed, but only abnormal results are displayed) Labs Reviewed - No data to display  EKG   Radiology No results found.  Procedures Procedures (including critical care time)  Medications Ordered in UC Medications  ketorolac (TORADOL) 30 MG/ML injection 30 mg (30 mg Intramuscular Given 10/28/18 1158)  ondansetron (ZOFRAN-ODT) disintegrating tablet 4 mg (4 mg Oral Given 10/28/18 1158)  ketorolac (TORADOL) 30 MG/ML injection (has no administration in time range)  ondansetron (ZOFRAN-ODT) 4 MG disintegrating tablet (has no administration in time range)    Initial Impression / Assessment and Plan / UC Course  I have reviewed the triage vital signs and the nursing notes.  Pertinent labs & imaging results that were available during my care of the patient were reviewed by me and considered in my medical decision making (see chart for details).     Consistent with similar headaches he has had in the past. States seems to be triggered at work. Heat vs stress vs elevated bp. Patient states he will take his BP medication once he is home after this visit. toradol and zofran provided here today. Encouraged follow up with PCP for BP recheck. Encouraged to increase breaks to maintain hydration at work. Return precautions provided. Patient verbalized understanding and agreeable to plan.  Ambulatory out of clinic without difficulty.    Final Clinical  Impressions(s) / UC Diagnoses   Final diagnoses:  Acute nonintractable headache, unspecified headache type     Discharge Instructions     Drink plenty of water, especially if getting hot/sweating.  Tylenol as needed for headache.  Please take your blood pressure medication today as prescribed.  Please follow up with your primary care provider for recheck of your blood pressure.  If any worsening of symptoms please return or go to the ER.     ED Prescriptions    Medication Sig Dispense Auth. Provider   acetaminophen (TYLENOL) 500 MG tablet Take 1 tablet (500 mg total) by mouth every 6 (six) hours as needed. 30 tablet Georgetta HaberBurky, Atisha Hamidi B, NP     Controlled Substance Prescriptions [place] Controlled Substance Registry consulted? Not Applicable   Georgetta HaberBurky, Quinlynn Cuthbert B, NP 10/28/18 1215

## 2018-10-28 NOTE — ED Triage Notes (Signed)
Pt sts HA and nausea starting last night; pt sts some improvement today but still having nausea

## 2018-10-28 NOTE — Discharge Instructions (Signed)
Drink plenty of water, especially if getting hot/sweating.  Tylenol as needed for headache.  Please take your blood pressure medication today as prescribed.  Please follow up with your primary care provider for recheck of your blood pressure.  If any worsening of symptoms please return or go to the ER.

## 2018-11-15 ENCOUNTER — Encounter: Payer: Self-pay | Admitting: Family

## 2018-12-07 ENCOUNTER — Emergency Department (HOSPITAL_COMMUNITY)
Admission: EM | Admit: 2018-12-07 | Discharge: 2018-12-07 | Disposition: A | Discharge status: Home / Self Care | Payer: No Typology Code available for payment source | Attending: Emergency Medicine | Admitting: Emergency Medicine

## 2018-12-07 ENCOUNTER — Emergency Department (HOSPITAL_COMMUNITY): Payer: No Typology Code available for payment source

## 2018-12-07 ENCOUNTER — Other Ambulatory Visit: Payer: Self-pay

## 2018-12-07 ENCOUNTER — Encounter (HOSPITAL_COMMUNITY): Payer: Self-pay | Admitting: Emergency Medicine

## 2018-12-07 DIAGNOSIS — M545 Low back pain, unspecified: Secondary | ICD-10-CM

## 2018-12-07 DIAGNOSIS — F17210 Nicotine dependence, cigarettes, uncomplicated: Secondary | ICD-10-CM | POA: Diagnosis not present

## 2018-12-07 DIAGNOSIS — I10 Essential (primary) hypertension: Secondary | ICD-10-CM | POA: Insufficient documentation

## 2018-12-07 DIAGNOSIS — Z21 Asymptomatic human immunodeficiency virus [HIV] infection status: Secondary | ICD-10-CM | POA: Insufficient documentation

## 2018-12-07 DIAGNOSIS — Y93I9 Activity, other involving external motion: Secondary | ICD-10-CM | POA: Diagnosis not present

## 2018-12-07 DIAGNOSIS — S4992XA Unspecified injury of left shoulder and upper arm, initial encounter: Secondary | ICD-10-CM | POA: Diagnosis not present

## 2018-12-07 DIAGNOSIS — M25512 Pain in left shoulder: Secondary | ICD-10-CM | POA: Diagnosis not present

## 2018-12-07 DIAGNOSIS — S3992XA Unspecified injury of lower back, initial encounter: Secondary | ICD-10-CM | POA: Diagnosis not present

## 2018-12-07 DIAGNOSIS — Y92410 Unspecified street and highway as the place of occurrence of the external cause: Secondary | ICD-10-CM | POA: Insufficient documentation

## 2018-12-07 DIAGNOSIS — Z79899 Other long term (current) drug therapy: Secondary | ICD-10-CM | POA: Diagnosis not present

## 2018-12-07 DIAGNOSIS — Y999 Unspecified external cause status: Secondary | ICD-10-CM | POA: Diagnosis not present

## 2018-12-07 MED ORDER — IBUPROFEN 600 MG PO TABS: 600.0000 mg | ORAL_TABLET | Freq: Four times a day (QID) | ORAL | 0 refills | Status: DC | PRN

## 2018-12-07 MED ORDER — METHOCARBAMOL 500 MG PO TABS: 500.0000 mg | ORAL_TABLET | Freq: Two times a day (BID) | ORAL | 0 refills | Status: DC

## 2018-12-07 NOTE — ED Notes (Signed)
Pt was independently ambulatory into department. Pt had not noted difficulty and steady gait.

## 2018-12-07 NOTE — ED Notes (Signed)
Patient to xray.

## 2018-12-07 NOTE — Discharge Instructions (Signed)
Take the prescribed medication as directed.  Can try using heat/ice on the back and shoulder as well. Follow-up with your primary care doctor. Return to the ED for new or worsening symptoms.
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
## 2018-12-22 ENCOUNTER — Ambulatory Visit (INDEPENDENT_AMBULATORY_CARE_PROVIDER_SITE_OTHER): Payer: Medicare HMO | Admitting: Internal Medicine

## 2018-12-22 ENCOUNTER — Other Ambulatory Visit: Payer: Self-pay

## 2018-12-22 VITALS — BP 162/112 | HR 67 | Temp 97.6°F | Ht 67.0 in | Wt 170.3 lb

## 2018-12-22 DIAGNOSIS — R350 Frequency of micturition: Secondary | ICD-10-CM

## 2018-12-22 DIAGNOSIS — I10 Essential (primary) hypertension: Secondary | ICD-10-CM | POA: Diagnosis not present

## 2018-12-22 DIAGNOSIS — R351 Nocturia: Secondary | ICD-10-CM

## 2018-12-22 DIAGNOSIS — N400 Enlarged prostate without lower urinary tract symptoms: Secondary | ICD-10-CM | POA: Diagnosis not present

## 2018-12-22 DIAGNOSIS — F419 Anxiety disorder, unspecified: Secondary | ICD-10-CM | POA: Insufficient documentation

## 2018-12-22 DIAGNOSIS — N401 Enlarged prostate with lower urinary tract symptoms: Secondary | ICD-10-CM | POA: Diagnosis not present

## 2018-12-22 DIAGNOSIS — Z79899 Other long term (current) drug therapy: Secondary | ICD-10-CM

## 2018-12-22 DIAGNOSIS — R69 Illness, unspecified: Secondary | ICD-10-CM | POA: Diagnosis not present

## 2018-12-22 MED ORDER — TAMSULOSIN HCL 0.4 MG PO CAPS: 0.4000 mg | ORAL_CAPSULE | Freq: Every day | ORAL | 0 refills | Status: DC

## 2018-12-22 NOTE — Progress Notes (Signed)
   CC: Urinary frequency, HTN, Anxiety  HPI:  [name] is a 68 y.o. M with PMHx listed below presenting for Urinary frequency, HTN, Anxiety. Please see the A&P for the status of the patient's chronic medical problems.  Past Medical History:  Diagnosis Date  . Diabetes mellitus without complication ([place])   . Hepatitis C, chronic ([place])   . HIV positive ([place])   . Hypertension    Review of Systems:  Performed and all others negative.  Physical Exam:  Vitals:   12/22/18 0927 12/22/18 0935  BP: (!) 175/105 (!) 162/112  Pulse: 63 67  Temp: 97.6 F (36.4 C)   SpO2: 98%   Weight: 170 lb 4.8 oz (77.2 kg)   Height: 5\' 7"  (1.702 m)    Physical Exam Constitutional:      General: He is not in acute distress.    Appearance: Normal appearance.  Cardiovascular:     Rate and Rhythm: Normal rate and regular rhythm.     Pulses: Normal pulses.     Heart sounds: Normal heart sounds.  Pulmonary:     Effort: Pulmonary effort is normal. No respiratory distress.     Breath sounds: Normal breath sounds.  Abdominal:     General: Bowel sounds are normal. There is no distension.     Palpations: Abdomen is soft.     Tenderness: There is no abdominal tenderness.  Musculoskeletal:        General: No swelling or deformity.  Skin:    General: Skin is warm and dry.  Neurological:     General: No focal deficit present.     Mental Status: Mental status is at baseline.   POCUS Examination: Grossly enlarger prostate at 5cm x 5cm x 3cm with intrusion into bladder.   Assessment & Plan:   See Encounters Tab for problem based charting.  Patient seen with Dr. Evette Doffing

## 2018-12-22 NOTE — Assessment & Plan Note (Addendum)
Patient present for evaluation of urinary frequency, intially due to concern for possible diabetes. He was treassure taht A1c in June was normal and we then further discussed his frequency. He states he has been going tot he bathroom 4 or more times per night and freequently during the day for about 1-2 months. He describes disruption in his stream as low volume or urination with return of his urge to urinate within 1- hours.  History was suspicious for prostatic hypertrophy, so POCUS was performed to confirm and showed enlarged prostate (~5 x 5 x 3 cm) with bladder intrusion. We will obtain PSA to rule out malignancy and start on Tamsulosin for symptoms. - PSA - Tamsulosin 0.4mg  Daily  ADDENDUM: PSA elevated at 7.3. Patient contacted and informed by phone. Patient informed of referral to urology for further evaluation. - Referral to Urology

## 2018-12-22 NOTE — Assessment & Plan Note (Signed)
Patient reports chronic anxiety that he has not sought help with in the past. He states he has a non-specific anxiety and will at times feel tightening in his chest when he is feeling anxious. He has not had counseling or medications in the past. He is interested in speaking with a counselor. We will start with counseling to avoid medication for now. Unable to have detail discussion as this was brought up at the end of our visit. - Referral to [facility]

## 2018-12-22 NOTE — Patient Instructions (Addendum)
Thank you for allowing [place] to care for you  Your increased urination is due to an enlarged prostate. This is likely benign, but we will do a lab test to help [place] rule out cancer.  We are also checking labs to help decide about medication changes for your blood pressure.  Please follow up with PCP in 1 month

## 2018-12-22 NOTE — Assessment & Plan Note (Addendum)
Patient last evaluated for BP 4 months ago and has not had his follow up blood work done. BP elevated at 162/112. We will get lab work today and consider charges based on these results. - HCTZ 25mg  Daily - Lisinopril 10mg  Daily (will plan to increase to 20mg  Daily pending lab work) - BMP  - May benefit from alpha-blocker in the future given prostatic hypertrophy

## 2018-12-23 LAB — BMP8+ANION GAP
Anion Gap: 16 mmol/L (ref 10.0–18.0)
BUN/Creatinine Ratio: 11 (ref 10–24)
BUN: 12 mg/dL (ref 8–27)
CO2: 18 mmol/L — ABNORMAL LOW (ref 20–29)
Calcium: 9.2 mg/dL (ref 8.6–10.2)
Chloride: 105 mmol/L (ref 96–106)
Creatinine, Ser: 1.07 mg/dL (ref 0.76–1.27)
GFR calc Af Amer: 83 mL/min/{1.73_m2} (ref >59)
GFR calc non Af Amer: 72 mL/min/{1.73_m2} (ref >59)
Glucose: 93 mg/dL (ref 65–99)
Potassium: 4 mmol/L (ref 3.5–5.2)
Sodium: 139 mmol/L (ref 134–144)

## 2018-12-23 LAB — PSA: Prostate Specific Ag, Serum: 7.3 ng/mL — ABNORMAL HIGH (ref 0.0–4.0)

## 2018-12-23 NOTE — Addendum Note (Signed)
Addended by: Neva Seat B on: 12/23/2018 02:14 PM   Modules accepted: Orders

## 2018-12-25 NOTE — Progress Notes (Signed)
Internal Medicine Clinic Attending  I saw and evaluated the patient.  I personally confirmed the key portions of the history and exam documented by Dr. Melvin and I reviewed pertinent patient test results.  The assessment, diagnosis, and plan were formulated together and I agree with the documentation in the resident's note.  

## 2018-12-28 ENCOUNTER — Telehealth: Payer: Self-pay | Admitting: Licensed Clinical Social Worker

## 2018-12-28 ENCOUNTER — Encounter: Payer: Self-pay | Admitting: Licensed Clinical Social Worker

## 2018-12-28 NOTE — Telephone Encounter (Signed)
Patient was contacted to discuss the referral from his doctor. Patient did not answer, and a vm was left for the pt to contact our office to establish care.

## 2018-12-30 ENCOUNTER — Other Ambulatory Visit: Payer: Self-pay

## 2018-12-30 ENCOUNTER — Emergency Department (HOSPITAL_COMMUNITY)
Admission: EM | Admit: 2018-12-30 | Discharge: 2018-12-31 | Disposition: A | Discharge status: Home / Self Care | Payer: Medicare HMO | Attending: Emergency Medicine | Admitting: Emergency Medicine

## 2018-12-30 DIAGNOSIS — F17210 Nicotine dependence, cigarettes, uncomplicated: Secondary | ICD-10-CM | POA: Diagnosis not present

## 2018-12-30 DIAGNOSIS — Z21 Asymptomatic human immunodeficiency virus [HIV] infection status: Secondary | ICD-10-CM | POA: Diagnosis not present

## 2018-12-30 DIAGNOSIS — M545 Low back pain, unspecified: Secondary | ICD-10-CM

## 2018-12-30 DIAGNOSIS — I10 Essential (primary) hypertension: Secondary | ICD-10-CM | POA: Insufficient documentation

## 2018-12-30 DIAGNOSIS — E119 Type 2 diabetes mellitus without complications: Secondary | ICD-10-CM | POA: Insufficient documentation

## 2018-12-30 DIAGNOSIS — Z79899 Other long term (current) drug therapy: Secondary | ICD-10-CM | POA: Diagnosis not present

## 2018-12-30 DIAGNOSIS — R69 Illness, unspecified: Secondary | ICD-10-CM | POA: Diagnosis not present

## 2018-12-31 ENCOUNTER — Encounter (HOSPITAL_COMMUNITY): Payer: Self-pay | Admitting: Emergency Medicine

## 2018-12-31 ENCOUNTER — Other Ambulatory Visit: Payer: Self-pay

## 2018-12-31 DIAGNOSIS — M545 Low back pain: Secondary | ICD-10-CM | POA: Diagnosis not present

## 2018-12-31 LAB — URINALYSIS, ROUTINE W REFLEX MICROSCOPIC
Bacteria, UA: NONE SEEN
Bilirubin Urine: NEGATIVE
Glucose, UA: NEGATIVE mg/dL
Ketones, ur: NEGATIVE mg/dL
Leukocytes,Ua: NEGATIVE
Nitrite: NEGATIVE
Protein, ur: NEGATIVE mg/dL
Specific Gravity, Urine: 1.025 (ref 1.005–1.030)
pH: 5 (ref 5.0–8.0)

## 2018-12-31 MED ORDER — PREDNISONE 20 MG PO TABS: ORAL_TABLET | ORAL | 0 refills | Status: DC

## 2018-12-31 MED ORDER — PREDNISONE 20 MG PO TABS
60.0000 mg | ORAL_TABLET | Freq: Once | ORAL | Status: AC
  Administered 2018-12-31: 60 mg via ORAL
  Filled 2018-12-31: qty 3

## 2018-12-31 MED ORDER — LIDOCAINE 5 % EX PTCH: 1.0000 | MEDICATED_PATCH | CUTANEOUS | 0 refills | Status: DC

## 2018-12-31 MED ORDER — LIDOCAINE 5 % EX PTCH
1.0000 | MEDICATED_PATCH | Freq: Once | CUTANEOUS | Status: DC
  Administered 2018-12-31: 1 via TRANSDERMAL
  Filled 2018-12-31: qty 1

## 2018-12-31 NOTE — ED Notes (Signed)
Patient verbalizes understanding of discharge instructions. Opportunity for questioning and answers were provided. Armband removed by staff, pt discharged from ED.  
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
## 2018-12-31 NOTE — ED Triage Notes (Signed)
C/o lower back pain since Tuesday.  No known injury.  Denies urinary complaints.

## 2018-12-31 NOTE — Discharge Instructions (Signed)
Take the prescribed medication as directed. °Follow-up with your primary care doctor. °Return to the ED for new or worsening symptoms. °

## 2019-01-03 DIAGNOSIS — H524 Presbyopia: Secondary | ICD-10-CM | POA: Diagnosis not present

## 2019-01-05 ENCOUNTER — Telehealth: Payer: Self-pay | Admitting: Licensed Clinical Social Worker

## 2019-01-05 NOTE — Telephone Encounter (Signed)
Patient was called to schedule a visit. Patient agreed to services, and will be added to my schedule for 10/29 @ 9:00.

## 2019-01-19 ENCOUNTER — Ambulatory Visit: Payer: Medicare HMO | Admitting: Licensed Clinical Social Worker

## 2019-01-23 ENCOUNTER — Other Ambulatory Visit: Payer: Self-pay | Admitting: Internal Medicine

## 2019-01-23 ENCOUNTER — Other Ambulatory Visit: Payer: Self-pay

## 2019-01-27 ENCOUNTER — Encounter (HOSPITAL_COMMUNITY): Payer: Self-pay

## 2019-01-27 ENCOUNTER — Other Ambulatory Visit: Payer: Self-pay

## 2019-01-27 ENCOUNTER — Ambulatory Visit (HOSPITAL_COMMUNITY)
Admission: EM | Admit: 2019-01-27 | Discharge: 2019-01-27 | Disposition: A | Discharge status: Home / Self Care | Payer: Medicare HMO | Attending: Family Medicine | Admitting: Family Medicine

## 2019-01-27 DIAGNOSIS — E785 Hyperlipidemia, unspecified: Secondary | ICD-10-CM | POA: Diagnosis not present

## 2019-01-27 DIAGNOSIS — R69 Illness, unspecified: Secondary | ICD-10-CM | POA: Diagnosis not present

## 2019-01-27 DIAGNOSIS — Z21 Asymptomatic human immunodeficiency virus [HIV] infection status: Secondary | ICD-10-CM | POA: Diagnosis not present

## 2019-01-27 DIAGNOSIS — Z79899 Other long term (current) drug therapy: Secondary | ICD-10-CM | POA: Diagnosis not present

## 2019-01-27 DIAGNOSIS — Z8249 Family history of ischemic heart disease and other diseases of the circulatory system: Secondary | ICD-10-CM | POA: Insufficient documentation

## 2019-01-27 DIAGNOSIS — J069 Acute upper respiratory infection, unspecified: Secondary | ICD-10-CM | POA: Insufficient documentation

## 2019-01-27 DIAGNOSIS — F17210 Nicotine dependence, cigarettes, uncomplicated: Secondary | ICD-10-CM | POA: Diagnosis not present

## 2019-01-27 DIAGNOSIS — I10 Essential (primary) hypertension: Secondary | ICD-10-CM | POA: Diagnosis not present

## 2019-01-27 DIAGNOSIS — B182 Chronic viral hepatitis C: Secondary | ICD-10-CM | POA: Diagnosis not present

## 2019-01-27 DIAGNOSIS — U071 COVID-19: Secondary | ICD-10-CM | POA: Insufficient documentation

## 2019-01-27 NOTE — ED Triage Notes (Signed)
Patient presents to Urgent Care with complaints of sinus congestion and drainage that has caused a sore throat as well since about 3 days ago. Patient reports he does not want testing for COVID today but does want a work note.

## 2019-01-27 NOTE — Discharge Instructions (Signed)
Push fluids to ensure adequate hydration and keep secretions thin.  Tylenol and/or ibuprofen as needed for pain or fevers.  Limit sudafed due to your high blood pressure.  Nasal saline can be used as needed throughout the day to help with nasal congestion.  Self isolate until covid results are back and negative.  Will notify you by phone of any positive findings. Your negative results will be sent through your MyChart.    If symptoms worsen or do not improve in the next week to return to be seen or to follow up with your PCP.

## 2019-01-27 NOTE — ED Provider Notes (Signed)
[facility]    CSN: [id number] Arrival date & time: 01/27/19  [id number]      History   Chief Complaint Chief Complaint  Patient presents with  . Sore Throat  . Sinusitis    HPI Jamie Clark is a [age] y.o. male.   Jamie Clark presents with complaints of facial pressure which started 11/3. Nasal congsetion and drainage. Sore throat. No chills or fevers. Poor taste in mouth. Some cough, cough is productive. Used vaporub under his nose and to forehead which did seem to help. No gi symptoms. No ear pain. No skin. No loss of taste or smell. No headache or body aches. No known ill contacts. Has also taken sudafed, alkaselzer plus and ibuprofen, which have mildly helped today. States his employer requested patient come in to be seen due to cough. History  Of htn, hasn't taken his medications today, hiv, hep c, dm.     ROS per HPI, negative if not otherwise mentioned.      Past Medical History:  Diagnosis Date  . Diabetes mellitus without complication ([facility])   . Hepatitis C, chronic ([facility])   . HIV positive ([facility])   . Hypertension     Patient Active Problem List   Diagnosis Date Noted  . Anxiety 12/22/2018  . Prostatic hypertrophy 12/22/2018  . Lumbar strain, initial encounter 08/22/2018  . Prediabetes 08/22/2018  . Screen for STD (sexually transmitted disease) 08/22/2018  . Tinea pedis of both feet 10/23/2013  . Dyslipidemia 01/17/2009  . HTN (hypertension) 01/17/2009  . HIV infection ([facility]) 12/18/2008  . Chronic viral hepatitis C ([facility]) 07/13/2007    History reviewed. No pertinent surgical history.     Home Medications    Prior to Admission medications   Medication Sig Start Date End Date Taking? Authorizing Provider  acetaminophen (TYLENOL) 500 MG tablet Take 1 tablet (500 mg total) by mouth every 6 (six) hours as needed. 10/28/18   Georgetta HaberBurky,  B, NP  atorvastatin (LIPITOR) 40 MG tablet Take 1 tablet (40 mg total) by mouth daily. 08/29/18   Ali LoweVogel,  Marie S, MD  hydrochlorothiazide (HYDRODIURIL) 25 MG tablet TAKE ONE Tablet BY MOUTH EVERY DAY 01/23/19   Judyann MunsonSnider, Cynthia, MD  ibuprofen (ADVIL) 600 MG tablet Take 1 tablet (600 mg total) by mouth every 6 (six) hours as needed. 12/07/18   Garlon HatchetSanders, Lisa M, PA-C  lidocaine (LIDODERM) 5 % Place 1 patch onto the skin daily. Remove & Discard patch within 12 hours or as directed by MD 12/31/18   Garlon HatchetSanders, Lisa M, PA-C  lisinopril (ZESTRIL) 10 MG tablet TAKE ONE Tablet BY MOUTH EVERY DAY 08/23/18   Judyann MunsonSnider, Cynthia, MD  methocarbamol (ROBAXIN) 500 MG tablet Take 1 tablet (500 mg total) by mouth 2 (two) times daily. 12/07/18   Garlon HatchetSanders, Lisa M, PA-C  ondansetron (ZOFRAN) 4 MG tablet Take 1 tablet (4 mg total) by mouth every 6 (six) hours. 09/15/18   Hall-Potvin, [place], PA-C  predniSONE (DELTASONE) 20 MG tablet Take 40 mg by mouth daily for 3 days, then 20mg  by mouth daily for 3 days, then 10mg  daily for 3 days 12/31/18   Garlon HatchetSanders, Lisa M, PA-C  tamsulosin (FLOMAX) 0.4 MG CAPS capsule Take 1 capsule (0.4 mg total) by mouth daily. 12/22/18   Beola CordMelvin, Alexander, MD  TRIUMEQ 600-50-300 MG tablet TAKE ONE TABLET BY MOUTH EVERY DAY 10/21/18   Blanchard Kelchixon, Stephanie N, NP    Family History Family History  Problem Relation Age of Onset  . Cancer Mother   .  Hypertension Mother   . Cancer Father   . Hypertension Father     Social History Social History   Tobacco Use  . Smoking status: Light Tobacco Smoker    Packs/day: 0.15    Types: Cigarettes  . Smokeless tobacco: Never Used  . Tobacco comment: 3 per day  Substance Use Topics  . Alcohol use: No    Alcohol/week: 0.0 standard drinks  . Drug use: No     Allergies   Patient has no known allergies.   Review of Systems Review of Systems   Physical Exam Triage Vital Signs ED Triage Vitals  Enc Vitals Group     BP 01/27/19 0853 (!) 166/107     Pulse Rate 01/27/19 0853 81     Resp 01/27/19 0853 16     Temp 01/27/19 0853 98.6 F (37 C)     Temp Source  01/27/19 0853 Oral     SpO2 01/27/19 0853 98 %     Weight --      Height --      Head Circumference --      Peak Flow --      Pain Score 01/27/19 0851 5     Pain Loc --      Pain Edu? --      Excl. in [place]? --    No data found.  Updated Vital Signs BP (!) 166/107 (BP Location: Right Arm) Comment: has not yet taken htn meds  Pulse 81   Temp 98.6 F (37 C) (Oral)   Resp 16   SpO2 98%    Physical Exam Constitutional:      Appearance: He is well-developed.  Cardiovascular:     Rate and Rhythm: Normal rate.  Pulmonary:     Effort: Pulmonary effort is normal.  Skin:    General: Skin is warm and dry.  Neurological:     Mental Status: He is alert and oriented to person, place, and time.      UC Treatments / Results  Labs (all labs ordered are listed, but only abnormal results are displayed) Labs Reviewed  NOVEL CORONAVIRUS, NAA (HOSP ORDER, SEND-OUT TO REF LAB; TAT 18-24 HRS)    EKG   Radiology No results found.  Procedures Procedures (including critical care time)  Medications Ordered in UC Medications - No data to display  Initial Impression / Assessment and Plan / UC Course  I have reviewed the triage vital signs and the nursing notes.  Pertinent labs & imaging results that were available during my care of the patient were reviewed by me and considered in my medical decision making (see chart for details).     Non toxic. Benign physical exam. Afebrile. No increased work of breathing. No tachycardia or hypoxia. History and physical consistent with viral illness.  Supportive cares recommended. Discussed limited sudafed with his htn. covid testing collected and pending with work note provided. Will notify of any positive findings and if any changes to treatment are needed.  Return precautions provided. Patient verbalized understanding and agreeable to plan.   Final Clinical Impressions(s) / UC Diagnoses   Final diagnoses:  Viral upper respiratory tract  infection     Discharge Instructions     Push fluids to ensure adequate hydration and keep secretions thin.  Tylenol and/or ibuprofen as needed for pain or fevers.  Limit sudafed due to your high blood pressure.  Nasal saline can be used as needed throughout the day to help with nasal congestion.  Self  isolate until covid results are back and negative.  Will notify you by phone of any positive findings. Your negative results will be sent through your MyChart.    If symptoms worsen or do not improve in the next week to return to be seen or to follow up with your PCP.      ED Prescriptions    None     PDMP not reviewed this encounter.   Georgetta Haber, NP 01/27/19 [phone]

## 2019-01-29 ENCOUNTER — Telehealth: Payer: Self-pay

## 2019-01-29 NOTE — Telephone Encounter (Signed)
Pt given Covid-19 positive results. Discussed mild, moderate and severe symptoms. Advised pt to call 911 for any respiratory issues and/dehydration. Discussed non test criteria for ending self isolation. Pt advised of way to manage symptoms at home and review isolation precautions especially the importance of washing hands frequently and wearing a mask when around others. Pt verbalized understanding. Pt is wanting to have results faxed to his employer. Pt does not have printer. Employers name is [employer] number (pt will call back with number tomorrow). Will notify HD.

## 2019-01-30 ENCOUNTER — Telehealth: Payer: Self-pay | Admitting: *Deleted

## 2019-01-30 LAB — NOVEL CORONAVIRUS, NAA (HOSP ORDER, SEND-OUT TO REF LAB; TAT 18-24 HRS): SARS-CoV-2, NAA: DETECTED — AB

## 2019-01-30 NOTE — Telephone Encounter (Signed)
I attempted to call patient x3 today with no answer. VM left. I would advise him to be seen in an emergency room for further evaluation.

## 2019-01-30 NOTE — Telephone Encounter (Signed)
Patient is calling to report he is having : fatigue,congestion and nausea. Patient is requesting help with symptoms. Patient was notified of + COVID status and he is treating as needed with OTC- it is increasingly hard for him to eat and drink due to nausea. Advised patient to reach out to PCP for help and advisement. Note sent on his behalf as FYI and possibly office can reach out to him.

## 2019-01-31 ENCOUNTER — Ambulatory Visit: Payer: Medicare HMO | Admitting: Licensed Clinical Social Worker

## 2019-01-31 ENCOUNTER — Other Ambulatory Visit: Payer: Self-pay | Admitting: Internal Medicine

## 2019-01-31 DIAGNOSIS — R112 Nausea with vomiting, unspecified: Secondary | ICD-10-CM

## 2019-01-31 MED ORDER — ONDANSETRON HCL 4 MG PO TABS: 4.0000 mg | ORAL_TABLET | Freq: Every day | ORAL | 1 refills | Status: DC | PRN

## 2019-01-31 NOTE — Progress Notes (Signed)
Chatted with Jamie Clark over the phone this morning. Notes sx began approximately one week ago. Was tested on Sunday and found to be COVID +.  He reports that he is feeling ok aside from nausea, fatigue and congestion. He reports that he is able to maintain fluid intake. Zofran sent to pharmacy to hopefully help him continue adequate fluid status. Denies shortness of breath at this time. Patient also asking about testing regarding his two roommates. Encouraged him to relay to them the need for testing and isolation.  Encouraged him to seek further evaluation at ED if he develops shortness of breath, inability to maintain adequate fluid intake, or symptoms worsen.

## 2019-01-31 NOTE — Telephone Encounter (Signed)
This has been addressed. Please see orders only encounter from today. Hubbard Hartshorn, BSN, RN-BC

## 2019-02-12 ENCOUNTER — Telehealth (HOSPITAL_COMMUNITY): Payer: Self-pay

## 2019-02-12 NOTE — Telephone Encounter (Signed)
Pt came by UC to get a copy of his letter and results from covid test that was done on 01/27/2019

## 2019-02-23 ENCOUNTER — Ambulatory Visit (INDEPENDENT_AMBULATORY_CARE_PROVIDER_SITE_OTHER): Payer: Medicare HMO | Admitting: Internal Medicine

## 2019-02-23 VITALS — BP 164/86 | HR 77 | Temp 98.0°F | Ht 67.5 in | Wt 167.8 lb

## 2019-02-23 DIAGNOSIS — I10 Essential (primary) hypertension: Secondary | ICD-10-CM

## 2019-02-23 DIAGNOSIS — F419 Anxiety disorder, unspecified: Secondary | ICD-10-CM

## 2019-02-23 DIAGNOSIS — Z720 Tobacco use: Secondary | ICD-10-CM | POA: Diagnosis not present

## 2019-02-23 DIAGNOSIS — Z79899 Other long term (current) drug therapy: Secondary | ICD-10-CM

## 2019-02-23 DIAGNOSIS — K5900 Constipation, unspecified: Secondary | ICD-10-CM | POA: Diagnosis not present

## 2019-02-23 DIAGNOSIS — Z8619 Personal history of other infectious and parasitic diseases: Secondary | ICD-10-CM

## 2019-02-23 DIAGNOSIS — R109 Unspecified abdominal pain: Secondary | ICD-10-CM

## 2019-02-23 DIAGNOSIS — B353 Tinea pedis: Secondary | ICD-10-CM | POA: Diagnosis not present

## 2019-02-23 DIAGNOSIS — R69 Illness, unspecified: Secondary | ICD-10-CM | POA: Diagnosis not present

## 2019-02-23 DIAGNOSIS — G4700 Insomnia, unspecified: Secondary | ICD-10-CM

## 2019-02-23 MED ORDER — CLOTRIMAZOLE-BETAMETHASONE 1-0.05 % EX CREA: TOPICAL_CREAM | CUTANEOUS | 1 refills | Status: DC

## 2019-02-23 NOTE — Progress Notes (Signed)
CC: Hypertension, right foot infection, insomnia, and abdominal pain  HPI: [name] is a 68 y.o.  with a PMH listed below presenting for hypertension, right foot infection, insomnia and abdominal pain  Please see A&P for status of the patient's chronic medical conditions  Past Medical History:  Diagnosis Date  . Diabetes mellitus without complication ([place])   . Hepatitis C, chronic ([place])   . HIV positive ([place])   . Hypertension    Review of Systems: Refer to history of present illness and assessment and plans for pertinent review of systems, all others reviewed and negative.  Physical Exam:  Vitals:   02/23/19 1406  BP: (!) 165/90  Pulse: 81  Temp: 98 F (36.7 C)  TempSrc: Oral  SpO2: 98%  Weight: 167 lb 12.8 oz (76.1 kg)  Height: 5' 7.5" (1.715 m)   Physical Exam  Constitutional: He is oriented to person, place, and time and well-developed, well-nourished, and in no distress.  HENT:  Head: Normocephalic and atraumatic.  Cardiovascular: Normal rate, regular rhythm and normal heart sounds.  Pulmonary/Chest: Effort normal and breath sounds normal. No respiratory distress. He has no wheezes.  Abdominal: Soft. Bowel sounds are normal. He exhibits no distension.  Musculoskeletal: Normal range of motion.        General: No edema.  Neurological: He is alert and oriented to person, place, and time.  Skin: Skin is warm and dry.  Small areas of white macerated skin located between toes on right foot  Psychiatric: Mood and affect normal.    Social History   Socioeconomic History  . Marital status: Divorced    Spouse name: Not on file  . Number of children: Not on file  . Years of education: Not on file  . Highest education level: Not on file  Occupational History  . Not on file  Social Needs  . Financial resource strain: Not on file  . Food insecurity    Worry: Not on file    Inability: Not on file  . Transportation needs    Medical: Not on file   Non-medical: Not on file  Tobacco Use  . Smoking status: Light Tobacco Smoker    Packs/day: 0.15    Types: Cigarettes  . Smokeless tobacco: Never Used  . Tobacco comment: 3 per day  Substance and Sexual Activity  . Alcohol use: No    Alcohol/week: 0.0 standard drinks  . Drug use: No  . Sexual activity: Never    Partners: Female    Comment: given condms  Lifestyle  . Physical activity    Days per week: Not on file    Minutes per session: Not on file  . Stress: Not on file  Relationships  . Social Herbalist on phone: Not on file    Gets together: Not on file    Attends religious service: Not on file    Active member of club or organization: Not on file    Attends meetings of clubs or organizations: Not on file    Relationship status: Not on file  . Intimate partner violence    Fear of current or ex partner: Not on file    Emotionally abused: Not on file    Physically abused: Not on file    Forced sexual activity: Not on file  Other Topics Concern  . Not on file  Social History Narrative  . Not on file    Family History  Problem Relation Age of Onset  .  Cancer Mother   . Hypertension Mother   . Cancer Father   . Hypertension Father     Assessment & Plan:   See Encounters Tab for problem based charting.  Patient discussed with Dr. Antony Contras

## 2019-02-23 NOTE — Patient Instructions (Signed)
Mr. Jamie Clark,  It was a pleasure to see you today. Thank you for coming in.   Today we discussed your foot infection. In regards to this please start using the anti-fungal cream that I have sent in. I have placed a referral for podiatry.    We also discussed your blood pressure. Please check your blood pressure about 2-3 times per week and record it. Please contact us in about 2-3 weeks to discuss this.    Please return to clinic in 1 month or sooner if needed.   Thank you again for coming in.   Asencion Noble.D.

## 2019-02-24 ENCOUNTER — Other Ambulatory Visit: Payer: Self-pay | Admitting: Internal Medicine

## 2019-02-24 ENCOUNTER — Other Ambulatory Visit: Payer: Self-pay | Admitting: Infectious Diseases

## 2019-02-24 DIAGNOSIS — B20 Human immunodeficiency virus [HIV] disease: Secondary | ICD-10-CM

## 2019-02-24 DIAGNOSIS — I10 Essential (primary) hypertension: Secondary | ICD-10-CM

## 2019-02-24 DIAGNOSIS — R109 Unspecified abdominal pain: Secondary | ICD-10-CM | POA: Insufficient documentation

## 2019-02-24 MED ORDER — LISINOPRIL 10 MG PO TABS: 10.0000 mg | ORAL_TABLET | Freq: Every day | ORAL | 5 refills | Status: DC

## 2019-02-24 NOTE — Assessment & Plan Note (Signed)
Patient continues to endorse anxiety, he has not an appointment recently with a [place] but is requesting to have this restarted.  We will send a message to Ms. Hicks to make appointment.

## 2019-02-24 NOTE — Assessment & Plan Note (Signed)
Patient is currently on hydrochlorothiazide 25 mg daily and lisinopril 10 mg daily at home, he has not taken his medication this morning.  Denies issues with taking his blood pressure medications.  Denies any side effects. Blood pressure today is elevated at 165/90, repeat was 164/86.  Discussed the importance of compliance and provided ambulatory blood pressure monitoring paper.  Patient will contact us in 2 to 3 weeks to read [place] the numbers for [place] to evaluate.  Continue hydrochlorothiazide 25 mg daily Continue lisinopril 10 mg daily Follow-up in 2 to 3 weeks to evaluate blood pressure readings

## 2019-02-24 NOTE — Assessment & Plan Note (Signed)
[facility], he reports that his right foot has been having fungal issues and whitish substance between his feet, it has been going on for years. Reports some itchyness, denies any pain. He has tried OTC foot cream, foot powder, and soaking, nothing seems to work. Dr. Baxter Flattery, treated him for a fungal infection about 1 year ago, he reports this did not help. He is requesting a foot doctor referral. On exam he is noted to have a whitish substance located between the toes. This does seem to be consistent with tinea pedis. recommended keeping feet dry and wearing dry wicking socks.   -Trial of Clotrimazole for now -Podiatry referral

## 2019-02-24 NOTE — Assessment & Plan Note (Signed)
Patient was recently seen in the ED on 11/06 for symptoms of facial pressure, nasal congestion, sore throat, poor taste in mouth, productive cough, he was noted to be positive for Covid and self isolated.  All of his symptoms from this have resolved.  For the past 2 days he has been having some constipation, decreased appetite, and occasional abdominal cramping he took stool softener which helped with his symptoms however he still has been having some bloating.  Overall today he is feeling well and exam is relatively unremarkable.  Seems that his abdominal pain was related to constipation.  No further work-up for his abdominal pain at this time.

## 2019-02-27 NOTE — Addendum Note (Signed)
Addended by: Jodean Lima on: 02/27/2019 02:16 PM   Modules accepted: Level of Service

## 2019-02-27 NOTE — Progress Notes (Signed)
Internal Medicine Clinic Attending  Case discussed with Dr. Krienke at the time of the visit.  We reviewed the resident's history and exam and pertinent patient test results.  I agree with the assessment, diagnosis, and plan of care documented in the resident's note.    

## 2019-03-01 ENCOUNTER — Encounter: Payer: Self-pay | Admitting: Infectious Diseases

## 2019-03-01 ENCOUNTER — Other Ambulatory Visit: Payer: Self-pay

## 2019-03-01 ENCOUNTER — Ambulatory Visit (INDEPENDENT_AMBULATORY_CARE_PROVIDER_SITE_OTHER): Payer: Medicare HMO | Admitting: Infectious Diseases

## 2019-03-01 VITALS — BP 181/88 | HR 58 | Temp 98.0°F | Wt 172.0 lb

## 2019-03-01 DIAGNOSIS — Z113 Encounter for screening for infections with a predominantly sexual mode of transmission: Secondary | ICD-10-CM

## 2019-03-01 DIAGNOSIS — Z21 Asymptomatic human immunodeficiency virus [HIV] infection status: Secondary | ICD-10-CM

## 2019-03-01 DIAGNOSIS — I10 Essential (primary) hypertension: Secondary | ICD-10-CM | POA: Diagnosis not present

## 2019-03-01 DIAGNOSIS — R69 Illness, unspecified: Secondary | ICD-10-CM | POA: Diagnosis not present

## 2019-03-01 DIAGNOSIS — Z23 Encounter for immunization: Secondary | ICD-10-CM

## 2019-03-01 DIAGNOSIS — U071 COVID-19: Secondary | ICD-10-CM | POA: Diagnosis not present

## 2019-03-01 MED ORDER — LISINOPRIL 20 MG PO TABS: 20.0000 mg | ORAL_TABLET | Freq: Every day | ORAL | 5 refills | Status: DC

## 2019-03-01 NOTE — Assessment & Plan Note (Signed)
S/P infection x 4 weeks ago. Mild disease symptoms that have fully resolved. No further needs. Discussed uncertainty surrounding the immune protection following natural infection; advised to continue with distancing, hand hygiene, masking.

## 2019-03-01 NOTE — Assessment & Plan Note (Addendum)
Well controlled on Triumeq. Will update VL/CD4 today. Refills provided.  STI screening at upcoming visits.  Flu shot today.  RTC with Dr. Baxter Flattery for routine follow up 6 months

## 2019-03-01 NOTE — Assessment & Plan Note (Signed)
Increase lisinopril to 20 mg QD, Continue HCTZ 25 mg QD with suggestion to take in the AM to avoid bathroom trips at night.  He will follow up with PCP in 3 months and check 1-3 times a week at the pharmacy to monitor. I asked him to let me know in 1 month if he still has readings > 150/90 so I can increase lisinopril component again and schedule lab follow up to check BMP.

## 2019-03-01 NOTE — Patient Instructions (Addendum)
Nice to see you today!  Glad to see you recovered well from [place] without any lingering effects.   Please stop by the lab before you leave today for your blood work.   Please continue taking your Triumeq every day as you have been.   For your blood pressure -  Continue taking the hydrochlorothiazide once a day (may be best to take it in the morning so you are not up using the bathroom all night)  Increase your Lisinopril to 20 mg once a day - with your current bottle can take 2 pills once a day until you get your new bottle with a higher dose.   Give the podiatry team a call to schedule an appointment.  [facility]) Address: [address], [place], [place] [address]  Phone: [phone]         Please return to see Dr. Baxter Flattery or Colletta Maryland in 6 months to check in again - sooner appointment in 3 months if you are not able to get in with your primary care team for your blood pressure.   Would like to see your blood pressure less than 150/90 ideally.

## 2019-03-01 NOTE — Progress Notes (Signed)
RFV: follow up for hiv disease  Patient ID: Donnel Venuto, male   DOB: April 26, 1950, 68 y.o.   MRN: [id number]   HPI Travonne is a 68yo M with hiv disease, CD 4 count of 770/VL undetectable on Triumeq which he has been taking routinely with good adherence.   He is worried about his blood pressure today - states he does not think it is working any longer as it is always high when he checks it at the pharmacy. He takes his HCTZ and lisinopril at night every night. He does have to get up frequently to use the bathroom. Denies headache, vision changes, SOB or palpitations.   Interval history notable for (+) COVID-19 infection 01/27/19. Went to urgent care after OTC medications would not relieve URI/Cough. His symptoms included nasal congestion, headaches and significant weakness/fatigue.  Since this illness he feels much better.   No sexual activity recently. No symptoms of dysuria, abdominal pain.   Review of Systems  Constitutional: Negative for chills, fever, malaise/fatigue and weight loss.  HENT: Negative for sore throat.        No dental problems  Respiratory: Negative for cough and sputum production.   Cardiovascular: Negative for chest pain and leg swelling.  Gastrointestinal: Negative for abdominal pain, diarrhea and vomiting.  Genitourinary: Negative for dysuria and flank pain.  Musculoskeletal: Negative for joint pain, myalgias and neck pain.  Skin: Negative for rash.  Neurological: Negative for dizziness, tingling and headaches.  Psychiatric/Behavioral: Negative for depression and substance abuse. The patient is not nervous/anxious and does not have insomnia.       Outpatient Encounter Medications as of 03/01/2019  Medication Sig  . acetaminophen (TYLENOL) 500 MG tablet Take 1 tablet (500 mg total) by mouth every 6 (six) hours as needed.  [name] atorvastatin (LIPITOR) 40 MG tablet Take 1 tablet (40 mg total) by mouth daily.  . clotrimazole-betamethasone (LOTRISONE) cream  Apply to affected area 2 times daily  . hydrochlorothiazide (HYDRODIURIL) 25 MG tablet TAKE ONE Tablet BY MOUTH EVERY DAY  . ibuprofen (ADVIL) 600 MG tablet Take 1 tablet (600 mg total) by mouth every 6 (six) hours as needed.  . lidocaine (LIDODERM) 5 % Place 1 patch onto the skin daily. Remove & Discard patch within 12 hours or as directed by MD  . methocarbamol (ROBAXIN) 500 MG tablet Take 1 tablet (500 mg total) by mouth 2 (two) times daily.  . ondansetron (ZOFRAN) 4 MG tablet Take 1 tablet (4 mg total) by mouth every 6 (six) hours.  . ondansetron (ZOFRAN) 4 MG tablet Take 1 tablet (4 mg total) by mouth daily as needed for nausea or vomiting.  . tamsulosin (FLOMAX) 0.4 MG CAPS capsule Take 1 capsule (0.4 mg total) by mouth daily.  . TRIUMEQ 600-50-300 MG tablet TAKE ONE TABLET BY MOUTH EVERY DAY  . [DISCONTINUED] lisinopril (ZESTRIL) 10 MG tablet Take 1 tablet (10 mg total) by mouth daily.  [name] lisinopril (ZESTRIL) 20 MG tablet Take 1 tablet (20 mg total) by mouth daily.  . predniSONE (DELTASONE) 20 MG tablet Take 40 mg by mouth daily for 3 days, then 20mg  by mouth daily for 3 days, then 10mg  daily for 3 days (Patient not taking: Reported on 03/01/2019)   No facility-administered encounter medications on file as of 03/01/2019.      Patient Active Problem List   Diagnosis Date Noted  . COVID-19 virus infection 03/01/2019  . Abdominal pain 02/24/2019  . Anxiety 12/22/2018  . Prostatic hypertrophy 12/22/2018  .  Prediabetes 08/22/2018  . Screen for STD (sexually transmitted disease) 08/22/2018  . Dyslipidemia 01/17/2009  . HTN (hypertension) 01/17/2009  . HIV infection (HCC) 12/18/2008  . Chronic viral hepatitis C (HCC) 07/13/2007     Health Maintenance Due  Topic Date Due  . TETANUS/TDAP  01/22/1971  . COLONOSCOPY  01/21/2002  . INFLUENZA VACCINE  10/22/2018     Physical Exam  BP (!) 181/88   Pulse (!) 58   Temp 98 F (36.7 C)   Wt 172 lb (78 kg)   BMI 26.54 kg/m      Physical Exam  Constitutional: He is oriented to person, place, and time. He appears well-developed and well-nourished. No distress.  HENT:  Mouth/Throat: Oropharynx is clear and moist. No oropharyngeal exudate.  Cardiovascular: Normal rate, regular rhythm and normal heart sounds. Exam reveals no gallop and no friction rub.  No murmur heard.  Pulmonary/Chest: Effort normal and breath sounds normal. No respiratory distress. He has no wheezes.  Abdominal: Soft. Bowel sounds are normal. He exhibits no distension. There is no tenderness.  Lymphadenopathy: He has no cervical adenopathy.  Neurological: He is alert and oriented to person, place, and time.  Skin: Skin is warm and dry. Psychiatric: He has a normal mood and affect. His behavior is normal.    Lab Results  Component Value Date   CD4TCELL 42 04/27/2018   Lab Results  Component Value Date   CD4TABS 750 04/27/2018   CD4TABS 670 12/07/2017   CD4TABS 800 09/08/2017   Lab Results  Component Value Date   HIV1RNAQUANT 21 (H) 08/22/2018   Lab Results  Component Value Date   HEPBSAB NEG 05/06/2010   Lab Results  Component Value Date   LABRPR NON-REACTIVE 08/22/2018    CBC Lab Results  Component Value Date   WBC 4.6 04/27/2018   RBC 4.50 04/27/2018   HGB 14.3 04/27/2018   HCT 41.4 04/27/2018   PLT 264 04/27/2018   MCV 92.0 04/27/2018   MCH 31.8 04/27/2018   MCHC 34.5 04/27/2018   RDW 13.1 04/27/2018   LYMPHSABS 1,826 04/27/2018   MONOABS 0.3 09/14/2017   EOSABS 138 04/27/2018    BMET Lab Results  Component Value Date   NA 139 12/22/2018   K 4.0 12/22/2018   CL 105 12/22/2018   CO2 18 (L) 12/22/2018   GLUCOSE 93 12/22/2018   BUN 12 12/22/2018   CREATININE 1.07 12/22/2018   CALCIUM 9.2 12/22/2018   GFRNONAA 72 12/22/2018   GFRAA 83 12/22/2018    Assessment and Plan Problem List Items Addressed This Visit      Unprioritized   Screen for STD (sexually transmitted disease)   Relevant Orders   RPR    HTN (hypertension)    Increase lisinopril to 20 mg QD, Continue HCTZ 25 mg QD with suggestion to take in the AM to avoid bathroom trips at night.  He will follow up with PCP in 3 months and check 1-3 times a week at the pharmacy to monitor. I asked him to let me know in 1 month if he still has readings > 150/90 so I can increase lisinopril component again and schedule lab follow up to check BMP.       Relevant Medications   lisinopril (ZESTRIL) 20 MG tablet   HIV infection (HCC) - Primary    Well controlled on Triumeq. Will update VL/CD4 today. Refills provided.  STI screening at upcoming visits.  Flu shot today.  RTC with Dr. [name] for routine follow  up 6 months       Relevant Orders   HIV 1 RNA quant-no reflex-bld   T-helper cell (CD4)- ([facility] clinic only)   COVID-19 virus infection    S/P infection x 4 weeks ago. Mild disease symptoms that have fully resolved. No further needs. Discussed uncertainty surrounding the immune protection following natural infection; advised to continue with distancing, hand hygiene, masking.        Other Visit Diagnoses    Need for immunization against influenza       Relevant Orders   Flu Vaccine QUAD 36+ mos IM (Completed)       Rexene Alberts, MSN, NP-C [facility]  [email or web address] Pager: [phone] Office: [phone] [facility] Main Line: [phone]

## 2019-03-02 LAB — T-HELPER CELL (CD4) - (RCID CLINIC ONLY)
CD4 % Helper T Cell: 44 % (ref 33–65)
CD4 T Cell Abs: 849 /uL (ref 400–1790)

## 2019-03-06 ENCOUNTER — Encounter: Payer: Self-pay | Admitting: *Deleted

## 2019-03-07 ENCOUNTER — Telehealth: Payer: Self-pay | Admitting: Licensed Clinical Social Worker

## 2019-03-07 ENCOUNTER — Encounter: Payer: Self-pay | Admitting: *Deleted

## 2019-03-07 LAB — HIV-1 RNA QUANT-NO REFLEX-BLD
HIV 1 RNA Quant: <20 copies/mL
HIV-1 RNA Quant, Log: <1.3 Log copies/mL

## 2019-03-07 LAB — RPR: RPR Ser Ql: NONREACTIVE

## 2019-03-07 NOTE — Telephone Encounter (Signed)
Patient called to reschedule visit. Patient did not answer, and a vm was left for the patient.

## 2019-03-23 ENCOUNTER — Ambulatory Visit: Payer: Medicare HMO

## 2019-04-05 NOTE — Addendum Note (Signed)
Addended by: Neomia Dear on: 04/05/2019 07:33 PM   Modules accepted: Orders

## 2019-04-06 NOTE — Addendum Note (Signed)
Addended by: Neomia Dear on: 04/06/2019 03:28 PM   Modules accepted: Orders

## 2019-06-13 ENCOUNTER — Encounter: Payer: Self-pay | Admitting: Infectious Diseases

## 2019-06-30 ENCOUNTER — Telehealth: Payer: Self-pay | Admitting: *Deleted

## 2019-06-30 NOTE — Telephone Encounter (Signed)
Patient received counseling through Hospice as he has experienced a lot of loss within the last month - his son, his sponsor, and a close family member within 2 weeks of each other.  He is in counseling with hospice, but they are unable to prescribe medication for his anxiety or sleeplessness.    He is starting at [employer] (days Monday - Friday) on 4/13, has orientation 9-3:30.  RN offered an appointment 4/14 at 4:00. Patient is worried about what to do over the weekend. RN advised him to the walk-in services available at [facility] or [facility], patient declined.  RN advised him to contact Primary care to see if they have same-day appointments available. He agreed.  He would still like the appointment at [facility], understands that our specialty is his HIV. Andree Coss, RN       Appointment Request From: Sindy Messing With Provider: Rexene Alberts, NP [facility]] Preferred Date Range: 07/03/2019 - 07/03/2019 Preferred Times: Any Time Reason for visit: Office Visit Comments: My depressed state of mind

## 2019-07-03 ENCOUNTER — Ambulatory Visit (INDEPENDENT_AMBULATORY_CARE_PROVIDER_SITE_OTHER): Payer: Medicare HMO | Admitting: Internal Medicine

## 2019-07-03 ENCOUNTER — Encounter: Payer: Self-pay | Admitting: Internal Medicine

## 2019-07-03 ENCOUNTER — Other Ambulatory Visit: Payer: Self-pay

## 2019-07-03 VITALS — BP 171/92 | HR 82 | Temp 98.1°F | Ht 68.0 in | Wt 180.0 lb

## 2019-07-03 DIAGNOSIS — R69 Illness, unspecified: Secondary | ICD-10-CM | POA: Diagnosis not present

## 2019-07-03 DIAGNOSIS — Z634 Disappearance and death of family member: Secondary | ICD-10-CM | POA: Diagnosis not present

## 2019-07-03 DIAGNOSIS — F4321 Adjustment disorder with depressed mood: Secondary | ICD-10-CM | POA: Diagnosis not present

## 2019-07-03 MED ORDER — TRAZODONE HCL 100 MG PO TABS: 100.0000 mg | ORAL_TABLET | Freq: Every day | ORAL | 1 refills | Status: DC

## 2019-07-03 NOTE — Patient Instructions (Signed)
Mr. Raimondo, I am sorry to hear about the loss of your son. What you are experiencing is part of the grieving process and is completely normal.  Sleep is of utmost importance as you work through this loss. I'd like you to try taking Trazodone an hour before bedtime like we discussed. If you feel groggy the next morning, try cutting the pill in half.  I'm glad you have a counselor as this is also important.   I am writing a work note to allow more frequent breaks during the day to help you get through work a little easier.   Take care, Dr. Chesley Mires

## 2019-07-04 ENCOUNTER — Encounter: Payer: Self-pay | Admitting: Internal Medicine

## 2019-07-04 DIAGNOSIS — F4321 Adjustment disorder with depressed mood: Secondary | ICD-10-CM | POA: Insufficient documentation

## 2019-07-04 DIAGNOSIS — Z634 Disappearance and death of family member: Secondary | ICD-10-CM | POA: Insufficient documentation

## 2019-07-04 NOTE — Progress Notes (Signed)
Acute Office Visit  Subjective:    Patient ID: Jamie Clark, male    DOB: May 14, 1950, 69 y.o.   MRN: [id number]  Chief Complaint  Patient presents with  . Insomnia    2/2 recent death of son  . Depression    HPI Patient is in today for grief over recent loss of his son. Please see problem based charting for further details.   Past Medical History:  Diagnosis Date  . Diabetes mellitus without complication (HCC)   . Hepatitis C, chronic (HCC)   . HIV positive (HCC)   . Hypertension     No past surgical history on file.  Family History  Problem Relation Age of Onset  . Cancer Mother   . Hypertension Mother   . Cancer Father   . Hypertension Father     Social History   Socioeconomic History  . Marital status: Divorced    Spouse name: Not on file  . Number of children: Not on file  . Years of education: Not on file  . Highest education level: Not on file  Occupational History  . Not on file  Tobacco Use  . Smoking status: Light Tobacco Smoker    Packs/day: 0.15    Types: Cigarettes  . Smokeless tobacco: Never Used  . Tobacco comment: 3 per day  Substance and Sexual Activity  . Alcohol use: No    Alcohol/week: 0.0 standard drinks  . Drug use: No  . Sexual activity: Never    Partners: Female    Comment: given condms  Other Topics Concern  . Not on file  Social History Narrative  . Not on file   Social Determinants of Health   Financial Resource Strain:   . Difficulty of Paying Living Expenses:   Food Insecurity:   . Worried About Programme researcher, broadcasting/film/video in the Last Year:   . Barista in the Last Year:   Transportation Needs:   . Freight forwarder (Medical):   [name] Lack of Transportation (Non-Medical):   Physical Activity:   . Days of Exercise per Week:   . Minutes of Exercise per Session:   Stress:   . Feeling of Stress :   Social Connections:   . Frequency of Communication with Friends and Family:   . Frequency of Social Gatherings  with Friends and Family:   . Attends Religious Services:   . Active Member of Clubs or Organizations:   . Attends Banker Meetings:   [name] Marital Status:   Intimate Partner Violence:   . Fear of Current or Ex-Partner:   . Emotionally Abused:   [name] Physically Abused:   . Sexually Abused:     Outpatient Medications Prior to Visit  Medication Sig Dispense Refill  . acetaminophen (TYLENOL) 500 MG tablet Take 1 tablet (500 mg total) by mouth every 6 (six) hours as needed. 30 tablet 0  . atorvastatin (LIPITOR) 40 MG tablet Take 1 tablet (40 mg total) by mouth daily. 90 tablet 1  . clotrimazole-betamethasone (LOTRISONE) cream Apply to affected area 2 times daily 15 g 1  . hydrochlorothiazide (HYDRODIURIL) 25 MG tablet TAKE ONE Tablet BY MOUTH EVERY DAY 30 tablet 4  . ibuprofen (ADVIL) 600 MG tablet Take 1 tablet (600 mg total) by mouth every 6 (six) hours as needed. 30 tablet 0  . lidocaine (LIDODERM) 5 % Place 1 patch onto the skin daily. Remove & Discard patch within 12 hours or as directed by MD 12  patch 0  . lisinopril (ZESTRIL) 20 MG tablet Take 1 tablet (20 mg total) by mouth daily. 30 tablet 5  . methocarbamol (ROBAXIN) 500 MG tablet Take 1 tablet (500 mg total) by mouth 2 (two) times daily. 20 tablet 0  . ondansetron (ZOFRAN) 4 MG tablet Take 1 tablet (4 mg total) by mouth every 6 (six) hours. 12 tablet 0  . ondansetron (ZOFRAN) 4 MG tablet Take 1 tablet (4 mg total) by mouth daily as needed for nausea or vomiting. 30 tablet 1  . predniSONE (DELTASONE) 20 MG tablet Take 40 mg by mouth daily for 3 days, then 20mg  by mouth daily for 3 days, then 10mg  daily for 3 days (Patient not taking: Reported on 03/01/2019) 12 tablet 0  . tamsulosin (FLOMAX) 0.4 MG CAPS capsule Take 1 capsule (0.4 mg total) by mouth daily. 90 capsule 0  . TRIUMEQ 600-50-300 MG tablet TAKE ONE TABLET BY MOUTH EVERY DAY 30 tablet 5   No facility-administered medications prior to visit.    No Known Allergies   Review of Systems  Constitutional: Positive for fatigue.  Psychiatric/Behavioral: Positive for behavioral problems, decreased concentration, dysphoric mood and sleep disturbance. Negative for suicidal ideas. The patient is nervous/anxious.        Objective:    Physical Exam Constitutional:      General: He is not in acute distress.    Appearance: Normal appearance.  Neurological:     Mental Status: He is alert.  Psychiatric:        Attention and Perception: Attention normal.        Mood and Affect: Mood is anxious.        Speech: Speech is rapid and pressured.        Behavior: Behavior normal.        Thought Content: Thought content normal.        Cognition and Memory: Cognition normal.        Judgment: Judgment normal.     BP (!) 171/92 (BP Location: Left Arm, Patient Position: Sitting, Cuff Size: Normal)   Pulse 82   Temp 98.1 F (36.7 C) (Oral)   Ht 5\' 8"  (1.727 m)   Wt 180 lb (81.6 kg)   SpO2 97%   BMI 27.37 kg/m  Wt Readings from Last 3 Encounters:  07/03/19 180 lb (81.6 kg)  03/01/19 172 lb (78 kg)  02/23/19 167 lb 12.8 oz (76.1 kg)    Health Maintenance Due  Topic Date Due  . TETANUS/TDAP  Never done  . COLONOSCOPY  Never done    There are no preventive care reminders to display for this patient.   Lab Results  Component Value Date   TSH 1.066 05/25/2007   Lab Results  Component Value Date   WBC 4.6 04/27/2018   HGB 14.3 04/27/2018   HCT 41.4 04/27/2018   MCV 92.0 04/27/2018   PLT 264 04/27/2018   Lab Results  Component Value Date   NA 139 12/22/2018   K 4.0 12/22/2018   CO2 18 (L) 12/22/2018   GLUCOSE 93 12/22/2018   BUN 12 12/22/2018   CREATININE 1.07 12/22/2018   BILITOT 0.4 04/27/2018   ALKPHOS 65 09/14/2017   AST 18 04/27/2018   ALT 12 04/27/2018   PROT 7.9 04/27/2018   ALBUMIN 2.7 (L) 09/14/2017   CALCIUM 9.2 12/22/2018   ANIONGAP 8 09/16/2017   Lab Results  Component Value Date   CHOL 175 04/27/2018   Lab Results   Component Value Date  HDL 37 (L) 04/27/2018   Lab Results  Component Value Date   LDLCALC 120 (H) 04/27/2018   Lab Results  Component Value Date   TRIG 85 04/27/2018   Lab Results  Component Value Date   CHOLHDL 4.7 04/27/2018   Lab Results  Component Value Date   HGBA1C 5.7 (H) 08/22/2018       Assessment & Plan:   Problem List Items Addressed This Visit    None       Meds ordered this encounter  Medications  . traZODone (DESYREL) 100 MG tablet    Sig: Take 1 tablet (100 mg total) by mouth at bedtime.    Dispense:  30 tablet    Refill:  1     Stanislaus Kaltenbach D Kiefer Opheim, DO

## 2019-07-04 NOTE — Assessment & Plan Note (Signed)
Jamie Clark lost his son suddenly less than a month ago due to an accidental overdose. This has been understandably very hard, particularly since they are still waiting on an autopsy to be performed. He endorses difficulty with sleeping, intermittent tearfulness without warning, and outbursts of anger. These episodes make it challenging for him to make it through a work day, but he is very much wanting to get back to a routine.   For the sleep difficulty, we will try Trazodone 100 mg qhs.  He is already seeing a counselor every week.  I have written a work note allowing him to have more frequent breaks whenever he feels the wave of emotions coming on.  I discussed that what he is feeling is all related to the grieving process and is normal. Will have him follow-up in 4 weeks with PCP to see how he is coping.

## 2019-07-05 ENCOUNTER — Encounter: Payer: Self-pay | Admitting: Infectious Diseases

## 2019-07-05 ENCOUNTER — Ambulatory Visit (INDEPENDENT_AMBULATORY_CARE_PROVIDER_SITE_OTHER): Payer: Medicare HMO | Admitting: Infectious Diseases

## 2019-07-05 ENCOUNTER — Other Ambulatory Visit: Payer: Self-pay

## 2019-07-05 VITALS — BP 185/106 | HR 92 | Wt 177.0 lb

## 2019-07-05 DIAGNOSIS — F4321 Adjustment disorder with depressed mood: Secondary | ICD-10-CM

## 2019-07-05 NOTE — Progress Notes (Signed)
I spent a few minutes talking with Jamie Clark today - he has had some emotional hardships with the loss of 3 close people recently and in care with [facility] Grief counseling but having trouble getting visits in [place]. I have helped organize an appointment for first visit with Marylu Lund here in [facility] clinic to arrange for follow up care at [facility].   Letter provided to excuse him from work to make that appointment.   He will call to schedule a follow up with his PCP to review blood pressure medications and adjust.   No charge for today's visit.    Rexene Alberts, MSN, NP-C [facility]  [email or web address] Pager: [phone] Office: [phone] [facility] Main Line: [phone]

## 2019-07-07 ENCOUNTER — Other Ambulatory Visit: Payer: Self-pay | Admitting: Internal Medicine

## 2019-07-10 NOTE — Progress Notes (Signed)
Internal Medicine Clinic Attending  Case discussed with Dr. Bloomfield at the time of the visit.  We reviewed the resident's history and exam and pertinent patient test results.  I agree with the assessment, diagnosis, and plan of care documented in the resident's note.  

## 2019-07-13 ENCOUNTER — Ambulatory Visit: Payer: Medicare HMO

## 2019-07-13 ENCOUNTER — Other Ambulatory Visit: Payer: Self-pay

## 2019-07-20 ENCOUNTER — Ambulatory Visit: Payer: Medicare HMO

## 2019-08-01 ENCOUNTER — Ambulatory Visit: Payer: Medicare HMO

## 2019-08-01 ENCOUNTER — Other Ambulatory Visit: Payer: Self-pay

## 2019-08-01 DIAGNOSIS — F4321 Adjustment disorder with depressed mood: Secondary | ICD-10-CM

## 2019-08-01 NOTE — Progress Notes (Unsigned)
Mental Health Therapist Progress Note   Name: Jamie Clark  Total time: 30  Type of Service: Individual Outpatient Mental Health Therapy  OBJECTIVE:  Mood: Euthymic and Affect: Appropriate Risk of harm to self or others: No plan to harm self or others  DIAGNOSIS:   GOALS ADDRESSED:  Patient will: 1.  Reduce symptoms of: stress  2.  Increase knowledge and/or ability of: coping skills  3.  Demonstrate ability to: Increase healthy adjustment to current life circumstances  INTERVENTIONS: Interventions utilized:  Supportive Counseling Therapist met with patient for outpatient mental health individual therapy to include ongoing assessment, support, and reinforcement.  Therapist allowed patient to "check in" since previous session; asking patient to share any positive coping skills they may have used over the previous week, along with any challenges faced.  Therapist provided supportive listening as patient processed their thoughts, emotional responses, and behaviors surrounding his family's grief responses to the death of his son.  EFFECTIVENESS/PLAN: Client was alert, oriented x3, with no SI, HI, or symptoms of psychosis (risk low).  Client was pleasant and friendly, engaging openly and appropriately with therapist, benefiting from supportive listening and exploration of feelings.  Next session recommended in one to two weeks.   Hughes Better, LCSW

## 2019-08-07 ENCOUNTER — Ambulatory Visit (HOSPITAL_COMMUNITY)
Admission: EM | Admit: 2019-08-07 | Discharge: 2019-08-07 | Disposition: A | Discharge status: Home / Self Care | Payer: Medicare HMO | Attending: Family Medicine | Admitting: Family Medicine

## 2019-08-07 ENCOUNTER — Other Ambulatory Visit: Payer: Self-pay

## 2019-08-07 ENCOUNTER — Ambulatory Visit (INDEPENDENT_AMBULATORY_CARE_PROVIDER_SITE_OTHER): Payer: Medicare HMO

## 2019-08-07 ENCOUNTER — Encounter (HOSPITAL_COMMUNITY): Payer: Self-pay

## 2019-08-07 DIAGNOSIS — R109 Unspecified abdominal pain: Secondary | ICD-10-CM | POA: Diagnosis not present

## 2019-08-07 DIAGNOSIS — K5900 Constipation, unspecified: Secondary | ICD-10-CM

## 2019-08-07 DIAGNOSIS — R140 Abdominal distension (gaseous): Secondary | ICD-10-CM | POA: Diagnosis not present

## 2019-08-07 LAB — POC OCCULT BLOOD, ED: Fecal Occult Bld: NEGATIVE

## 2019-08-07 MED ORDER — POLYETHYLENE GLYCOL 3350 17 G PO PACK: 17.0000 g | PACK | Freq: Every day | ORAL | 0 refills | Status: DC

## 2019-08-07 NOTE — ED Provider Notes (Signed)
[facility]    CSN: [id number] Arrival date & time: 08/07/19  1045      History   Chief Complaint Chief Complaint  Patient presents with  . Abdominal Pain    HPI Jamie Clark is a 69 y.o. male.   HPI   Patient is here for abdominal pain and distention. It is also had some loose bowels. He states that he has had some black bowel movements. He is worried about bleeding in his bowels. He is under a lot of stress. His son died 2 months ago. He has been grieving. His family is worried that he may have a recurrence of his gastric ulcers, with the black stools. He has not had any dizziness. No chest pain or shortness of breath. He has some abdominal cramping but is not severe pain. He states that he has had some constipation. This is been bothering him off and on. He took stool softener on Friday. He had some loose bowels over the weekend. He has not any absent substantial bowel movement in many days. He is not having any upper epigastric pain or heartburn. He does not drink alcohol He does smoke cigarettes and is advised not to, he has not been taking NSAID medications. He has never had a colonoscopy. He states that he has been advised by his primary care doctor.  Past Medical History:  Diagnosis Date  . Diabetes mellitus without complication (HCC)   . Hepatitis C, chronic (HCC)   . HIV positive (HCC)   . Hypertension     Patient Active Problem List   Diagnosis Date Noted  . Grief at loss of child 07/04/2019  . COVID-19 virus infection 03/01/2019  . Abdominal pain 02/24/2019  . Anxiety 12/22/2018  . Prostatic hypertrophy 12/22/2018  . Prediabetes 08/22/2018  . Screen for STD (sexually transmitted disease) 08/22/2018  . Dyslipidemia 01/17/2009  . HTN (hypertension) 01/17/2009  . HIV infection (HCC) 12/18/2008  . Chronic viral hepatitis C (HCC) 07/13/2007    History reviewed. No pertinent surgical history.     Home Medications    Prior to Admission  medications   Medication Sig Start Date End Date Taking? Authorizing Provider  acetaminophen (TYLENOL) 500 MG tablet Take 1 tablet (500 mg total) by mouth every 6 (six) hours as needed. 10/28/18   Georgetta Haber, NP  atorvastatin (LIPITOR) 40 MG tablet Take 1 tablet (40 mg total) by mouth daily. 08/29/18   Ali Lowe, MD  clotrimazole-betamethasone (LOTRISONE) cream Apply to affected area 2 times daily 02/23/19 02/23/20  Claudean Severance, MD  hydrochlorothiazide (HYDRODIURIL) 25 MG tablet TAKE ONE Tablet BY MOUTH EVERY DAY 07/07/19   Judyann Munson, MD  ibuprofen (ADVIL) 600 MG tablet Take 1 tablet (600 mg total) by mouth every 6 (six) hours as needed. 12/07/18   Garlon Hatchet, PA-C  lidocaine (LIDODERM) 5 % Place 1 patch onto the skin daily. Remove & Discard patch within 12 hours or as directed by MD 12/31/18   Garlon Hatchet, PA-C  lisinopril (ZESTRIL) 20 MG tablet Take 1 tablet (20 mg total) by mouth daily. 03/01/19   Blanchard Kelch, NP  methocarbamol (ROBAXIN) 500 MG tablet Take 1 tablet (500 mg total) by mouth 2 (two) times daily. 12/07/18   Garlon Hatchet, PA-C  polyethylene glycol (MIRALAX / GLYCOLAX) 17 g packet Take 17 g by mouth daily. 08/07/19   Eustace Moore, MD  tamsulosin (FLOMAX) 0.4 MG CAPS capsule Take 1 capsule (0.4 mg total)  by mouth daily. 12/22/18   Beola Cord, MD  traZODone (DESYREL) 100 MG tablet Take 1 tablet (100 mg total) by mouth at bedtime. 07/03/19 08/02/19  [place], Karma Ganja D, DO  TRIUMEQ 600-50-300 MG tablet TAKE ONE TABLET BY MOUTH EVERY DAY 02/24/19   Judyann Munson, MD    Family History Family History  Problem Relation Age of Onset  . Cancer Mother   . Hypertension Mother   . Cancer Father   . Hypertension Father     Social History Social History   Tobacco Use  . Smoking status: Light Tobacco Smoker    Packs/day: 0.15    Types: Cigarettes  . Smokeless tobacco: Never Used  . Tobacco comment: 3 per day  Substance Use Topics  .  Alcohol use: No    Alcohol/week: 0.0 standard drinks  . Drug use: No     Allergies   Patient has no known allergies.   Review of Systems Review of Systems  Constitutional: Positive for appetite change.  Gastrointestinal: Positive for abdominal distention, abdominal pain, constipation, diarrhea and nausea. Negative for anal bleeding, blood in stool and vomiting.       Nausea in the morning     Physical Exam Triage Vital Signs ED Triage Vitals  Enc Vitals Group     BP 08/07/19 1204 (!) 146/92     Pulse Rate 08/07/19 1204 77     Resp 08/07/19 1204 18     Temp 08/07/19 1204 98 F (36.7 C)     Temp Source 08/07/19 1204 Oral     SpO2 08/07/19 1204 100 %     Weight --      Height --      Head Circumference --      Peak Flow --      Pain Score 08/07/19 1358 0     Pain Loc --      Pain Edu? --      Excl. in GC? --    No data found.  Updated Vital Signs BP (!) 146/92 (BP Location: Right Arm)   Pulse 77   Temp 98 F (36.7 C) (Oral)   Resp 18   SpO2 100%      Physical Exam Constitutional:      General: He is not in acute distress.    Appearance: He is well-developed.  HENT:     Head: Normocephalic and atraumatic.  Eyes:     Conjunctiva/sclera: Conjunctivae normal.     Pupils: Pupils are equal, round, and reactive to light.  Cardiovascular:     Rate and Rhythm: Normal rate and regular rhythm.     Heart sounds: Normal heart sounds.  Pulmonary:     Effort: Pulmonary effort is normal. No respiratory distress.     Breath sounds: Normal breath sounds.  Abdominal:     General: Abdomen is protuberant. There is distension. There is no abdominal bruit.     Palpations: Abdomen is soft. There is no hepatomegaly or splenomegaly.     Tenderness: There is no abdominal tenderness.     Hernia: No hernia is present.  Genitourinary:    Rectum: Normal. Guaiac result negative.  Musculoskeletal:        General: Normal range of motion.     Cervical back: Normal range of motion.    Skin:    General: Skin is warm and dry.  Neurological:     Mental Status: He is alert.  Psychiatric:        Mood and Affect:  Mood normal.        Behavior: Behavior normal.      UC Treatments / Results  Labs (all labs ordered are listed, but only abnormal results are displayed) Labs Reviewed  POC OCCULT BLOOD, ED    EKG   Radiology DG Abd 2 Views  Result Date: 08/07/2019 CLINICAL DATA:  Abdominal pain, distension EXAM: ABDOMEN - 2 VIEW COMPARISON:  11/10/2011 FINDINGS: The bowel gas pattern is normal. There is no evidence of free air. Mild-moderate stool throughout the colon. No radio-opaque calculi or other significant radiographic abnormality is seen. IMPRESSION: Nonobstructive bowel gas pattern. Electronically Signed   By: Davina Poke D.O.   On: 08/07/2019 13:33    Procedures Procedures (including critical care time)  Medications Ordered in UC Medications - No data to display  Initial Impression / Assessment and Plan / UC Course  I have reviewed the triage vital signs and the nursing notes.  Pertinent labs & imaging results that were available during my care of the patient were reviewed by me and considered in my medical decision making (see chart for details).     X-ray does show a lot of stool in the abdomen. Patient reports distention and constipation. His guaiac test is negative. He does not have blood in his bowels at this time. I think he is suffering with constipation and overflow loose bowels. He does need a colonoscopy and this is encouraged. He needs to follow-up with his primary care doctor.  Final Clinical Impressions(s) / UC Diagnoses   Final diagnoses:  Constipation, unspecified constipation type     Discharge Instructions     You need to drink more water You are on 2 medicines that can cause constipation, cetirizine (Zyrtec) your allergy pill and trazodone for sleep Take MiraLAX 2 times a day until you are successful moving your bowels Then  take 1 time a day until your bowel movements return to normal You may take 1 dose a day as long as you need (indefinitely) Extra you get enough fiber in your diet    ED Prescriptions    Medication Sig Dispense Auth. Provider   polyethylene glycol (MIRALAX / GLYCOLAX) 17 g packet Take 17 g by mouth daily. 14 each Raylene Everts, MD     PDMP not reviewed this encounter.   Raylene Everts, MD 08/07/19 [phone]

## 2019-08-07 NOTE — ED Triage Notes (Signed)
Pt states he has abdominal pain pt everything he eats it runs right through him. Pt states he has black stools x 3 days.

## 2019-08-07 NOTE — Discharge Instructions (Signed)
You need to drink more water You are on 2 medicines that can cause constipation, cetirizine (Zyrtec) your allergy pill and trazodone for sleep Take MiraLAX 2 times a day until you are successful moving your bowels Then take 1 time a day until your bowel movements return to normal You may take 1 dose a day as long as you need (indefinitely) Extra you get enough fiber in your diet

## 2019-08-11 ENCOUNTER — Other Ambulatory Visit: Payer: Self-pay | Admitting: Infectious Diseases

## 2019-08-17 ENCOUNTER — Ambulatory Visit: Payer: Medicare HMO

## 2019-08-30 ENCOUNTER — Telehealth: Payer: Self-pay | Admitting: Pharmacy Technician

## 2019-08-30 ENCOUNTER — Other Ambulatory Visit: Payer: Self-pay

## 2019-08-30 ENCOUNTER — Encounter: Payer: Self-pay | Admitting: Infectious Diseases

## 2019-08-30 ENCOUNTER — Ambulatory Visit (INDEPENDENT_AMBULATORY_CARE_PROVIDER_SITE_OTHER): Payer: Medicare Other | Admitting: Infectious Diseases

## 2019-08-30 VITALS — Temp 97.7°F | Ht 68.0 in | Wt 178.0 lb

## 2019-08-30 DIAGNOSIS — B20 Human immunodeficiency virus [HIV] disease: Secondary | ICD-10-CM | POA: Diagnosis not present

## 2019-08-30 DIAGNOSIS — I10 Essential (primary) hypertension: Secondary | ICD-10-CM

## 2019-08-30 DIAGNOSIS — Z634 Disappearance and death of family member: Secondary | ICD-10-CM

## 2019-08-30 DIAGNOSIS — Z21 Asymptomatic human immunodeficiency virus [HIV] infection status: Secondary | ICD-10-CM

## 2019-08-30 DIAGNOSIS — F4321 Adjustment disorder with depressed mood: Secondary | ICD-10-CM

## 2019-08-30 DIAGNOSIS — F419 Anxiety disorder, unspecified: Secondary | ICD-10-CM

## 2019-08-30 MED ORDER — TRIUMEQ 600-50-300 MG PO TABS: 1.0000 | ORAL_TABLET | Freq: Every day | ORAL | 5 refills | Status: DC

## 2019-08-30 MED ORDER — PROPRANOLOL HCL 40 MG PO TABS: 40.0000 mg | ORAL_TABLET | Freq: Every evening | ORAL | 1 refills | Status: DC

## 2019-08-30 MED ORDER — ESCITALOPRAM OXALATE 10 MG PO TABS: 10.0000 mg | ORAL_TABLET | Freq: Every day | ORAL | 1 refills | Status: DC

## 2019-08-30 NOTE — Telephone Encounter (Signed)
[facility] Patient Advocate Encounter  Patient voiced concerns for high cost of medication during his office visit today. He fills his medications at the [facility] on [address]. When I searched further, he is [employer] D coverage which makes his medications $0. One medication, Miralax is considered over the counter and won't be covered by insurance. Patient was provided our phone number if he runs into any problems obtaining the medications prescribed today.

## 2019-08-30 NOTE — Assessment & Plan Note (Signed)
Continue therapy sessions. Anxiety management as described

## 2019-08-30 NOTE — Assessment & Plan Note (Signed)
Continue ACE inhibitor and HCTZ (not sure he is taking this). Starting propranolol for anxiety with palpitations and nightmares so may have some effect on BP management.  May do better on calcium channel blocker combination therapy vs ACE.

## 2019-08-30 NOTE — Assessment & Plan Note (Addendum)
Anxiety and depression - will start Lexapro 10 mg QHS to see if this helps him. I will have him follow up in 4 weeks to evaluate effect. He understands it will take several weeks to work.  Propranolol 40 mg QHS to start for anxiety with palpitations and possible nightmare disorder.  Can increase to TID prn but would like to start with simplest administration directions as possible. Clonidine may also be helpful for him. I am hopeful this will help achieve some sleep for him. Can stop trazodone if ineffective (also seems to possibly causing some bad dreams).

## 2019-08-30 NOTE — Patient Instructions (Signed)
Please continue your Triumeq every day   STOP your Trazodone  START taking the following every night at 9:00 1. Propanolol - one pill  2. Escitalopram - one pill   Please come back in 1 month to follow up if you cannot be seen by a primary care doctor by then.

## 2019-08-30 NOTE — Progress Notes (Signed)
[facility]V: follow up for hiv disease, ongoing anxiety / depression with poor sleep; hypertension.   Patient ID: Jamie Clark, male   DOB: Sep 18, 1950, 69 y.o.   MRN: [id number]   HPI Jamie Clark is a 69 y.o. male with well controlled HIV disease with durably suppressed viral loads and full reconstitution of immune system with CD4 849 in Dec 2020.   He is worried about his blood pressure today - states he is still taking his lisinopril (he does not recognize the HCTZ). BP 751 systolic today. He does not have any headaches or vision changes. He does report palpitations mostly with episodes of anxiety. He is under the impression I am his primary care provider and does not recall our discussion a few months ago regarding contacting [facility] for follow up to help with blood pressure control.   Anxiety has been more severe and worsening. Bad at night and impacts his sleep. He estimates he sleeps only 2 hours a night now and wakes up about an hour after he tries to put himself to bed around 10:00 pm. States the anxiety is making it hard for him and his "mind keeps racing." His employer is requesting he get help for sleep and anxiety or else he will be forced to put him on leave.   He has been seeking ongoing counseling services through his church to help with grief counseling with loss of his son.    Review of Systems  Constitutional: Positive for fatigue. Negative for chills and fever.  HENT: Negative for dental problem and sore throat.   Respiratory: Negative for cough and shortness of breath.   Cardiovascular: Negative.  Negative for chest pain and leg swelling.  Gastrointestinal: Negative for abdominal pain, diarrhea and vomiting.  Genitourinary: Negative for dysuria and flank pain.  Musculoskeletal: Negative for myalgias and neck pain.  Skin: Negative for rash.  Neurological: Negative for dizziness and headaches.  Psychiatric/Behavioral: Positive for decreased  concentration, dysphoric mood and sleep disturbance. The patient is nervous/anxious.       Outpatient Encounter Medications as of 08/30/2019  Medication Sig   abacavir-dolutegravir-lamiVUDine (TRIUMEQ) 600-50-300 MG tablet Take 1 tablet by mouth daily.   acetaminophen (TYLENOL) 500 MG tablet Take 1 tablet (500 mg total) by mouth every 6 (six) hours as needed.   atorvastatin (LIPITOR) 40 MG tablet Take 1 tablet (40 mg total) by mouth daily.   clotrimazole-betamethasone (LOTRISONE) cream Apply to affected area 2 times daily   hydrochlorothiazide (HYDRODIURIL) 25 MG tablet TAKE ONE Tablet BY MOUTH EVERY DAY   ibuprofen (ADVIL) 600 MG tablet Take 1 tablet (600 mg total) by mouth every 6 (six) hours as needed.   lidocaine (LIDODERM) 5 % Place 1 patch onto the skin daily. Remove & Discard patch within 12 hours or as directed by MD   lisinopril (ZESTRIL) 20 MG tablet TAKE ONE TABLET BY MOUTH EVERY DAY   methocarbamol (ROBAXIN) 500 MG tablet Take 1 tablet (500 mg total) by mouth 2 (two) times daily.   polyethylene glycol (MIRALAX / GLYCOLAX) 17 g packet Take 17 g by mouth daily.   tamsulosin (FLOMAX) 0.4 MG CAPS capsule Take 1 capsule (0.4 mg total) by mouth daily.   [DISCONTINUED] TRIUMEQ 600-50-300 MG tablet TAKE ONE TABLET BY MOUTH EVERY DAY   escitalopram (LEXAPRO) 10 MG tablet Take 1 tablet (10 mg total) by mouth daily.   propranolol (INDERAL) 40 MG tablet Take 1 tablet (40 mg total) by mouth at bedtime.   [DISCONTINUED]  traZODone (DESYREL) 100 MG tablet Take 1 tablet (100 mg total) by mouth at bedtime.   No facility-administered encounter medications on file as of 08/30/2019.     Patient Active Problem List   Diagnosis Date Noted   Grief at loss of child 07/04/2019   COVID-19 virus infection 03/01/2019   Abdominal pain 02/24/2019   Anxiety 12/22/2018   Prostatic hypertrophy 12/22/2018   Prediabetes 08/22/2018   Screen for STD (sexually transmitted disease) 08/22/2018    Dyslipidemia 01/17/2009   HTN (hypertension) 01/17/2009   HIV infection (HCC) 12/18/2008   Hepatitis C virus infection cured after antiviral drug therapy 07/13/2007     Health Maintenance Due  Topic Date Due   TETANUS/TDAP  Never done   COLONOSCOPY  Never done     Physical Exam  Temp 97.7 F (36.5 C)    Ht 5\' 8"  (1.727 m)    Wt 178 lb (80.7 kg)    BMI 27.06 kg/m   BP 172/92 HR 102   Physical Exam Vitals reviewed.  Constitutional:      Appearance: Normal appearance. He is not ill-appearing.  HENT:     Head: Normocephalic.     Mouth/Throat:     Mouth: Mucous membranes are moist.     Pharynx: Oropharynx is clear.  Eyes:     General: No scleral icterus. Cardiovascular:     Rate and Rhythm: Normal rate and regular rhythm.  Pulmonary:     Effort: Pulmonary effort is normal.     Breath sounds: Normal breath sounds.  Abdominal:     General: There is no distension.     Palpations: Abdomen is soft.  Musculoskeletal:        General: Normal range of motion.     Cervical back: Normal range of motion.  Skin:    Coloration: Skin is not jaundiced or pale.  Neurological:     Mental Status: He is alert and oriented to person, place, and time.  Psychiatric:        Mood and Affect: Mood normal.        Behavior: Behavior normal.        Judgment: Judgment normal.      Lab Results  Component Value Date   CD4TCELL 44 03/01/2019   Lab Results  Component Value Date   CD4TABS 849 03/01/2019   CD4TABS 750 04/27/2018   CD4TABS 670 12/07/2017   Lab Results  Component Value Date   HIV1RNAQUANT <20 NOT DETECTED 03/01/2019   Lab Results  Component Value Date   HEPBSAB NEG 05/06/2010   Lab Results  Component Value Date   LABRPR NON-REACTIVE 03/01/2019    CBC Lab Results  Component Value Date   WBC 4.6 04/27/2018   RBC 4.50 04/27/2018   HGB 14.3 04/27/2018   HCT 41.4 04/27/2018   PLT 264 04/27/2018   MCV 92.0 04/27/2018   MCH 31.8 04/27/2018   MCHC 34.5  04/27/2018   RDW 13.1 04/27/2018   LYMPHSABS 1,826 04/27/2018   MONOABS 0.3 09/14/2017   EOSABS 138 04/27/2018    BMET Lab Results  Component Value Date   NA 139 12/22/2018   K 4.0 12/22/2018   CL 105 12/22/2018   CO2 18 (L) 12/22/2018   GLUCOSE 93 12/22/2018   BUN 12 12/22/2018   CREATININE 1.07 12/22/2018   CALCIUM 9.2 12/22/2018   GFRNONAA 72 12/22/2018   GFRAA 83 12/22/2018    Assessment and Plan Problem List Items Addressed This Visit  Unprioritized   HIV infection (HCC) - Primary (Chronic)    Well controlled with durable viral suppression and immune reconstitution. Will get updated labs for him today. Continue Triumeq once daily.   Need to get him set back up with primary care for wellness/preventative care and disease management for anxiety/depression and HTN. He has a hard time recalling who gave him what for medications. He saw Dr. Chesley Mires in April 2021. Will refer back       Relevant Medications   abacavir-dolutegravir-lamiVUDine (TRIUMEQ) 600-50-300 MG tablet   Other Relevant Orders   HIV-1 RNA quant-no reflex-bld   T-helper cell (CD4)- ([facility] clinic only)   RPR   COMPLETE METABOLIC PANEL WITH GFR   CBC with Differential/Platelet   HTN (hypertension)    Continue ACE inhibitor and HCTZ (not sure he is taking this). Starting propranolol for anxiety with palpitations and nightmares so may have some effect on BP management.  May do better on calcium channel blocker combination therapy vs ACE.       Relevant Medications   propranolol (INDERAL) 40 MG tablet   Anxiety    Anxiety and depression - will start Lexapro 10 mg QHS to see if this helps him. I will have him follow up in 4 weeks to evaluate effect. He understands it will take several weeks to work.  Propranolol 40 mg QHS to start for anxiety with palpitations and possible nightmare disorder.  Can increase to TID prn but would like to start with simplest administration directions as  possible. Clonidine may also be helpful for him. I am hopeful this will help achieve some sleep for him. Can stop trazodone if ineffective (also seems to possibly causing some bad dreams).       Relevant Medications   escitalopram (LEXAPRO) 10 MG tablet   Grief at loss of child    Continue therapy sessions. Anxiety management as described        Other Visit Diagnoses    Human immunodeficiency virus (HIV) disease (HCC)       Relevant Medications   abacavir-dolutegravir-lamiVUDine (TRIUMEQ) 600-50-300 MG tablet       Rexene Alberts, MSN, NP-C [facility]  [email or web address] Pager: [phone] Office: [phone] [facility] Main Line: [phone]

## 2019-08-30 NOTE — Assessment & Plan Note (Addendum)
Well controlled with durable viral suppression and immune reconstitution. Will get updated labs for him today. Continue Triumeq once daily.   Need to get him set back up with primary care for wellness/preventative care and disease management for anxiety/depression and HTN. He has a hard time recalling who gave him what for medications. He saw Dr. Chesley Mires in April 2021. Will refer back

## 2019-08-31 LAB — CBC WITH DIFFERENTIAL/PLATELET
Absolute Monocytes: 347 cells/uL (ref 200–950)
Basophils Absolute: 32 cells/uL (ref 0–200)
Basophils Relative: 0.7 %
Eosinophils Absolute: 189 cells/uL (ref 15–500)
Eosinophils Relative: 4.2 %
HCT: 42.3 % (ref 38.5–50.0)
Hemoglobin: 14.5 g/dL (ref 13.2–17.1)
Lymphs Abs: 2129 cells/uL (ref 850–3900)
MCH: 33.2 pg — ABNORMAL HIGH (ref 27.0–33.0)
MCHC: 34.3 g/dL (ref 32.0–36.0)
MCV: 96.8 fL (ref 80.0–100.0)
MPV: 9.9 fL (ref 7.5–12.5)
Monocytes Relative: 7.7 %
Neutro Abs: 1805 cells/uL (ref 1500–7800)
Neutrophils Relative %: 40.1 %
Platelets: 288 10*3/uL (ref 140–400)
RBC: 4.37 10*6/uL (ref 4.20–5.80)
RDW: 14.5 % (ref 11.0–15.0)
Total Lymphocyte: 47.3 %
WBC: 4.5 10*3/uL (ref 3.8–10.8)

## 2019-08-31 LAB — RPR: RPR Ser Ql: NONREACTIVE

## 2019-08-31 LAB — COMPLETE METABOLIC PANEL WITH GFR
AG Ratio: 1.1 (calc) (ref 1.0–2.5)
ALT: 22 U/L (ref 9–46)
AST: 20 U/L (ref 10–35)
Albumin: 4 g/dL (ref 3.6–5.1)
Alkaline phosphatase (APISO): 97 U/L (ref 35–144)
BUN/Creatinine Ratio: 13 (calc) (ref 6–22)
BUN: 19 mg/dL (ref 7–25)
CO2: 20 mmol/L (ref 20–32)
Calcium: 9.4 mg/dL (ref 8.6–10.3)
Chloride: 108 mmol/L (ref 98–110)
Creat: 1.42 mg/dL — ABNORMAL HIGH (ref 0.70–1.25)
GFR, Est African American: 59 mL/min/{1.73_m2} — ABNORMAL LOW (ref >=60)
GFR, Est Non African American: 51 mL/min/{1.73_m2} — ABNORMAL LOW (ref >=60)
Globulin: 3.8 g/dL (calc) — ABNORMAL HIGH (ref 1.9–3.7)
Glucose, Bld: 121 mg/dL — ABNORMAL HIGH (ref 65–99)
Potassium: 4.1 mmol/L (ref 3.5–5.3)
Sodium: 139 mmol/L (ref 135–146)
Total Bilirubin: 0.6 mg/dL (ref 0.2–1.2)
Total Protein: 7.8 g/dL (ref 6.1–8.1)

## 2019-08-31 LAB — T-HELPER CELL (CD4) - (RCID CLINIC ONLY)
CD4 % Helper T Cell: 46 % (ref 33–65)
CD4 T Cell Abs: 949 /uL (ref 400–1790)

## 2019-08-31 LAB — HIV-1 RNA QUANT-NO REFLEX-BLD
HIV 1 RNA Quant: <20 copies/mL — AB
HIV-1 RNA Quant, Log: <1.3 Log copies/mL — AB

## 2019-09-29 ENCOUNTER — Other Ambulatory Visit: Payer: Self-pay

## 2019-09-29 ENCOUNTER — Ambulatory Visit (INDEPENDENT_AMBULATORY_CARE_PROVIDER_SITE_OTHER): Payer: Medicare Other | Admitting: Infectious Diseases

## 2019-09-29 ENCOUNTER — Encounter: Payer: Self-pay | Admitting: Infectious Diseases

## 2019-09-29 DIAGNOSIS — F4321 Adjustment disorder with depressed mood: Secondary | ICD-10-CM | POA: Diagnosis not present

## 2019-09-29 DIAGNOSIS — I10 Essential (primary) hypertension: Secondary | ICD-10-CM

## 2019-09-29 DIAGNOSIS — Z634 Disappearance and death of family member: Secondary | ICD-10-CM | POA: Diagnosis not present

## 2019-09-29 DIAGNOSIS — Z21 Asymptomatic human immunodeficiency virus [HIV] infection status: Secondary | ICD-10-CM | POA: Diagnosis not present

## 2019-09-29 DIAGNOSIS — N400 Enlarged prostate without lower urinary tract symptoms: Secondary | ICD-10-CM

## 2019-09-29 MED ORDER — TRAZODONE HCL 50 MG PO TABS
50.0000 mg | ORAL_TABLET | Freq: Every day | ORAL | 3 refills | Status: DC
Start: 2019-09-29 — End: 2020-01-30

## 2019-09-29 MED ORDER — LISINOPRIL-HYDROCHLOROTHIAZIDE 20-25 MG PO TABS: 1.0000 | ORAL_TABLET | Freq: Every day | ORAL | 3 refills | Status: DC

## 2019-09-29 NOTE — Progress Notes (Signed)
Patient ID: Jamie Clark, male   DOB: [birth year], 69 y.o.   MRN: [id number]  CC: HIV follow up care Anxiety / depression Sleep Blood pressure     HPI Jamie Clark says he is doing well today and has been more often since our last visit. He tried the Lexapro for a few days but felt it made him to sleepy and self discontinued it. He has had a better time getting some sleep lately with returning to work. He admits he uses work as a distraction to help him cope with grief. Still going to counseling sessions.   He has continued his Triumeq once a day without any missed doses. No side effects or concerns with regards to HIV managmenet.   He has not taken his blood pressure pills today. He tries to get them in most evenings during the week. States he takes Lisinopril and HCTZ. Getting up frequently to urinate at night. Can hold void 4 hours during the day. Has some occasional need to "push" when he tries to pee but this is not often. He self discontinued the flomax at some point in the past.   Requesting refocus on plan to manage sleep and resume Trazodone once a night 50 mg. He felt this worked very well.   He has an appointment with his PCP coming up in September.    Review of Systems  Constitutional: Positive for fatigue. Negative for chills and fever.  HENT: Negative for dental problem and sore throat.   Respiratory: Negative for cough and shortness of breath.   Cardiovascular: Negative.  Negative for chest pain and leg swelling.  Gastrointestinal: Negative for abdominal pain, diarrhea and vomiting.  Genitourinary: Negative for dysuria and flank pain.  Musculoskeletal: Negative for myalgias and neck pain.  Skin: Negative for rash.  Neurological: Negative for dizziness and headaches.  Psychiatric/Behavioral: Positive for sleep disturbance. Negative for decreased concentration and dysphoric mood. The patient is not nervous/anxious.       Outpatient Encounter Medications as of  09/29/2019  Medication Sig  . abacavir-dolutegravir-lamiVUDine (TRIUMEQ) 600-50-300 MG tablet Take 1 tablet by mouth daily.  [name] acetaminophen (TYLENOL) 500 MG tablet Take 1 tablet (500 mg total) by mouth every 6 (six) hours as needed.  [name] ibuprofen (ADVIL) 600 MG tablet Take 1 tablet (600 mg total) by mouth every 6 (six) hours as needed.  . polyethylene glycol (MIRALAX / GLYCOLAX) 17 g packet Take 17 g by mouth daily.  . [DISCONTINUED] hydrochlorothiazide (HYDRODIURIL) 25 MG tablet TAKE ONE Tablet BY MOUTH EVERY DAY  . [DISCONTINUED] lidocaine (LIDODERM) 5 % Place 1 patch onto the skin daily. Remove & Discard patch within 12 hours or as directed by MD  . [DISCONTINUED] lisinopril (ZESTRIL) 20 MG tablet TAKE ONE TABLET BY MOUTH EVERY DAY  . lisinopril-hydrochlorothiazide (ZESTORETIC) 20-25 MG tablet Take 1 tablet by mouth daily.  . tamsulosin (FLOMAX) 0.4 MG CAPS capsule Take 1 capsule (0.4 mg total) by mouth daily. (Patient not taking: Reported on 09/29/2019)  . traZODone (DESYREL) 50 MG tablet Take 1 tablet (50 mg total) by mouth at bedtime.  . [DISCONTINUED] atorvastatin (LIPITOR) 40 MG tablet Take 1 tablet (40 mg total) by mouth daily. (Patient not taking: Reported on 09/29/2019)  . [DISCONTINUED] clotrimazole-betamethasone (LOTRISONE) cream Apply to affected area 2 times daily (Patient not taking: Reported on 09/29/2019)  . [DISCONTINUED] escitalopram (LEXAPRO) 10 MG tablet Take 1 tablet (10 mg total) by mouth daily. (Patient not taking: Reported on 09/29/2019)  . [DISCONTINUED] methocarbamol (ROBAXIN)  500 MG tablet Take 1 tablet (500 mg total) by mouth 2 (two) times daily. (Patient not taking: Reported on 09/29/2019)  . [DISCONTINUED] propranolol (INDERAL) 40 MG tablet Take 1 tablet (40 mg total) by mouth at bedtime. (Patient not taking: Reported on 09/29/2019)   No facility-administered encounter medications on file as of 09/29/2019.     Patient Active Problem List   Diagnosis Date Noted  . Grief at loss  of child 07/04/2019  . Anxiety 12/22/2018  . Prostatic hypertrophy 12/22/2018  . Prediabetes 08/22/2018  . Screen for STD (sexually transmitted disease) 08/22/2018  . Dyslipidemia 01/17/2009  . HTN (hypertension) 01/17/2009  . HIV infection (HCC) 12/18/2008  . Hepatitis C virus infection cured after antiviral drug therapy 07/13/2007     Health Maintenance Due  Topic Date Due  . TETANUS/TDAP  Never done  . COLONOSCOPY  Never done     Physical Exam  BP (!) 189/113 Comment: Forgot to take medication  Pulse 68   Wt 179 lb 3.2 oz (81.3 kg)   SpO2 95%   BMI 27.25 kg/m   BP 172/92 HR 102   Physical Exam Vitals reviewed.  Constitutional:      Appearance: Normal appearance. He is not ill-appearing.  HENT:     Head: Normocephalic.     Mouth/Throat:     Mouth: Mucous membranes are moist.     Pharynx: Oropharynx is clear.  Eyes:     General: No scleral icterus. Cardiovascular:     Rate and Rhythm: Normal rate and regular rhythm.  Pulmonary:     Effort: Pulmonary effort is normal.     Breath sounds: Normal breath sounds.  Abdominal:     General: There is no distension.     Palpations: Abdomen is soft.  Musculoskeletal:        General: Normal range of motion.     Cervical back: Normal range of motion.  Skin:    Coloration: Skin is not jaundiced or pale.  Neurological:     Mental Status: He is alert and oriented to person, place, and time.  Psychiatric:        Mood and Affect: Mood normal.        Behavior: Behavior normal.        Judgment: Judgment normal.      Lab Results  Component Value Date   CD4TCELL 46 08/30/2019   Lab Results  Component Value Date   CD4TABS 949 08/30/2019   CD4TABS 849 03/01/2019   CD4TABS 750 04/27/2018   Lab Results  Component Value Date   HIV1RNAQUANT <20 DETECTED (A) 08/30/2019   Lab Results  Component Value Date   HEPBSAB NEG 05/06/2010   Lab Results  Component Value Date   LABRPR NON-REACTIVE 08/30/2019    CBC Lab  Results  Component Value Date   WBC 4.5 08/30/2019   RBC 4.37 08/30/2019   HGB 14.5 08/30/2019   HCT 42.3 08/30/2019   PLT 288 08/30/2019   MCV 96.8 08/30/2019   MCH 33.2 (H) 08/30/2019   MCHC 34.3 08/30/2019   RDW 14.5 08/30/2019   LYMPHSABS 2,129 08/30/2019   MONOABS 0.3 09/14/2017   EOSABS 189 08/30/2019    BMET Lab Results  Component Value Date   NA 139 08/30/2019   K 4.1 08/30/2019   CL 108 08/30/2019   CO2 20 08/30/2019   GLUCOSE 121 (H) 08/30/2019   BUN 19 08/30/2019   CREATININE 1.42 (H) 08/30/2019   CALCIUM 9.4 08/30/2019   GFRNONAA 51 (  L) 08/30/2019   GFRAA 59 (L) 08/30/2019    Assessment and Plan Problem List Items Addressed This Visit      Unprioritized   HIV infection (HCC) (Chronic)    Under excellent durable control on Triumeq with healthy CD4 count recovery.  We reviewed labs today. No indication for STI screen at this time.  RTC with myself or Dr. Drue Second in 4-6 months.       HTN (hypertension)    BP Readings from Last 3 Encounters:  09/29/19 (!) 189/113  08/07/19 (!) 146/92  07/05/19 (!) 185/106   Will combine current doses of lisinopril + HCTZ in one pill for his convenience. I also asked him to please plan on taking his blood pressure medication daily and at least an hour before his future doctor visits so we can help properly adjust medication.   His creatinine is elevated today which I discussed with him is likely directly due to persistently elevated blood pressure.   May benefit from adding Norvasc once daily. He will follow up with PCP in September for further adjustment.       Relevant Medications   lisinopril-hydrochlorothiazide (ZESTORETIC) 20-25 MG tablet   Prostatic hypertrophy    He stopped flomax - thinks Rx ran out. He will discuss with his PCP.  He is also interested in prostate exam  I asked him to take the blood pressure medication in the morning / lunch so he is not up urinating all night since we are trying to focus on  his sleep. Offered note for work to allow for more liberalized bathroom breaks - he will contact me if this is needed.       Grief at loss of child    Did not tolerate Lexapro 10 mg --> offered to start lower dose at 5 mg QHS but he felt this was making him too groggy during the day. He does seem like he is doing well coping compared to last visit and seems more streamlined with thoughts and can get prioritized and focused now. We discussed options and will plan to resume his trazodone 50 mg QHS. He plans to give this a few months and discuss ongoing treatment with his PCP in the fall.  He will continue counseling with his current team.           Rexene Alberts, MSN, NP-C [facility]  [email or web address] Pager: [phone] Office: [phone] [facility] Main Line: [phone]

## 2019-09-29 NOTE — Patient Instructions (Addendum)
STOP the lexapro and propanolol  START trazodone one pill once a night when needed for sleep   START taking the combination blood pressure pill once a day in the mornings.  **Take this at least an hour before your doctors appointments.   Please come back to see either [name] or Dr. Drue Second in 4 - 6 months  We can do your blood work at the visit

## 2019-09-30 ENCOUNTER — Encounter: Payer: Self-pay | Admitting: Infectious Diseases

## 2019-09-30 NOTE — Assessment & Plan Note (Signed)
BP Readings from Last 3 Encounters:  09/29/19 (!) 189/113  08/07/19 (!) 146/92  07/05/19 (!) 185/106   Will combine current doses of lisinopril + HCTZ in one pill for his convenience. I also asked him to please plan on taking his blood pressure medication daily and at least an hour before his future doctor visits so we can help properly adjust medication.   His creatinine is elevated today which I discussed with him is likely directly due to persistently elevated blood pressure.   May benefit from adding Norvasc once daily. He will follow up with PCP in September for further adjustment.

## 2019-09-30 NOTE — Assessment & Plan Note (Signed)
Did not tolerate Lexapro 10 mg --> offered to start lower dose at 5 mg QHS but he felt this was making him too groggy during the day. He does seem like he is doing well coping compared to last visit and seems more streamlined with thoughts and can get prioritized and focused now. We discussed options and will plan to resume his trazodone 50 mg QHS. He plans to give this a few months and discuss ongoing treatment with his PCP in the fall.  He will continue counseling with his current team.

## 2019-09-30 NOTE — Assessment & Plan Note (Signed)
He stopped flomax - thinks Rx ran out. He will discuss with his PCP.  He is also interested in prostate exam  I asked him to take the blood pressure medication in the morning / lunch so he is not up urinating all night since we are trying to focus on his sleep. Offered note for work to allow for more liberalized bathroom breaks - he will contact me if this is needed.

## 2019-09-30 NOTE — Assessment & Plan Note (Signed)
Under excellent durable control on Triumeq with healthy CD4 count recovery.  We reviewed labs today. No indication for STI screen at this time.  RTC with myself or Dr. Drue Second in 4-6 months.

## 2019-10-12 ENCOUNTER — Other Ambulatory Visit: Payer: Self-pay | Admitting: Internal Medicine

## 2019-10-12 DIAGNOSIS — B20 Human immunodeficiency virus [HIV] disease: Secondary | ICD-10-CM

## 2019-11-09 ENCOUNTER — Other Ambulatory Visit: Payer: Self-pay

## 2019-11-09 ENCOUNTER — Encounter (HOSPITAL_COMMUNITY): Payer: Self-pay

## 2019-11-09 ENCOUNTER — Ambulatory Visit (HOSPITAL_COMMUNITY)
Admission: EM | Admit: 2019-11-09 | Discharge: 2019-11-09 | Disposition: A | Discharge status: Home / Self Care | Payer: Medicare Other | Attending: Physician Assistant | Admitting: Physician Assistant

## 2019-11-09 DIAGNOSIS — N5089 Other specified disorders of the male genital organs: Secondary | ICD-10-CM

## 2019-11-09 MED ORDER — ACETAMINOPHEN 500 MG PO TABS
1000.0000 mg | ORAL_TABLET | Freq: Three times a day (TID) | ORAL | 0 refills | Status: DC | PRN
Start: 2019-11-09 — End: 2022-03-05

## 2019-11-09 NOTE — ED Triage Notes (Signed)
Pt presents with groin and scrotum pain & swelling for the past few days.

## 2019-11-09 NOTE — Discharge Instructions (Addendum)
Call urology tomorrow and take first available appointment  Wear supportive underwear  Take tylenol for pain  If severely worsening over the weekend, return or go to the Emergency Department

## 2019-11-09 NOTE — ED Provider Notes (Addendum)
[facility]    CSN: [id number] Arrival date & time: 11/09/19  1446      History   Chief Complaint Chief Complaint  Patient presents with  . Groin Pain & Swelling    HPI Jamie Clark is a 69 y.o. male.   Patient presents for right-sided testicular swelling.  He reports is been an issue on and off for the last several years.  He reports he will have episodes of swelling and some discomfort that will resolve after short time with rest.  He reports most recent has been present for last 2 days.  He reports swelling was a bit more yesterday but is improved with rest.  He reports this become more of an issue when he has prolonged standing.  He states he believes he has had a hernia here and was evaluated for in the past.  He reports a dull pain that is not severe.  Reports is very consistent with previous episodes.  Denies any painful urination, frequency or urgency.  There is no discharge.  Moving his bowels per usual.  No abdominal pain, nausea or vomiting.  No fevers or chills.     Past Medical History:  Diagnosis Date  . Diabetes mellitus without complication (HCC)   . Hepatitis C, chronic (HCC)   . HIV positive (HCC)   . Hypertension     Patient Active Problem List   Diagnosis Date Noted  . Grief at loss of child 07/04/2019  . Anxiety 12/22/2018  . Prostatic hypertrophy 12/22/2018  . Prediabetes 08/22/2018  . Screen for STD (sexually transmitted disease) 08/22/2018  . Dyslipidemia 01/17/2009  . HTN (hypertension) 01/17/2009  . HIV infection (HCC) 12/18/2008  . Hepatitis C virus infection cured after antiviral drug therapy 07/13/2007    History reviewed. No pertinent surgical history.     Home Medications    Prior to Admission medications   Medication Sig Start Date End Date Taking? Authorizing Provider  acetaminophen (TYLENOL) 500 MG tablet Take 2 tablets (1,000 mg total) by mouth every 8 (eight) hours as needed. 11/09/19   Whitaker Holderman, Veryl Speak, PA-C    ibuprofen (ADVIL) 600 MG tablet Take 1 tablet (600 mg total) by mouth every 6 (six) hours as needed. 12/07/18   Garlon Hatchet, PA-C  lisinopril-hydrochlorothiazide (ZESTORETIC) 20-25 MG tablet Take 1 tablet by mouth daily. 09/29/19   Blanchard Kelch, NP  polyethylene glycol (MIRALAX / GLYCOLAX) 17 g packet Take 17 g by mouth daily. 08/07/19   Eustace Moore, MD  tamsulosin (FLOMAX) 0.4 MG CAPS capsule Take 1 capsule (0.4 mg total) by mouth daily. Patient not taking: Reported on 09/29/2019 12/22/18   Beola Cord, MD  traZODone (DESYREL) 50 MG tablet Take 1 tablet (50 mg total) by mouth at bedtime. 09/29/19   Blanchard Kelch, NP  TRIUMEQ 600-50-300 MG tablet TAKE ONE Tablet BY MOUTH EVERY DAY 10/12/19   Blanchard Kelch, NP    Family History Family History  Problem Relation Age of Onset  . Cancer Mother   . Hypertension Mother   . Cancer Father   . Hypertension Father     Social History Social History   Tobacco Use  . Smoking status: Light Tobacco Smoker    Packs/day: 0.15    Types: Cigarettes  . Smokeless tobacco: Never Used  . Tobacco comment: 3 per day  Vaping Use  . Vaping Use: Never used  Substance Use Topics  . Alcohol use: No    Alcohol/week: 0.0 standard  drinks  . Drug use: No     Allergies   Patient has no known allergies.   Review of Systems Review of Systems   Physical Exam Triage Vital Signs ED Triage Vitals  Enc Vitals Group     BP 11/09/19 1644 (!) 168/106     Pulse Rate 11/09/19 1644 80     Resp 11/09/19 1644 17     Temp 11/09/19 1644 98 F (36.7 C)     Temp Source 11/09/19 1644 Oral     SpO2 11/09/19 1644 95 %     Weight --      Height --      Head Circumference --      Peak Flow --      Pain Score 11/09/19 1649 8     Pain Loc --      Pain Edu? --      Excl. in GC? --    No data found.  Updated Vital Signs BP (!) 168/106 (BP Location: Right Arm)   Pulse 80   Temp 98 F (36.7 C) (Oral)   Resp 17   SpO2 95%   Visual  Acuity Right Eye Distance:   Left Eye Distance:   Bilateral Distance:    Right Eye Near:   Left Eye Near:    Bilateral Near:     Physical Exam Vitals and nursing note reviewed.  Constitutional:      Appearance: Normal appearance.  Cardiovascular:     Rate and Rhythm: Normal rate.     Heart sounds: No murmur heard.   Pulmonary:     Effort: Pulmonary effort is normal.     Breath sounds: Normal breath sounds.  Abdominal:     General: Bowel sounds are normal. There is no distension.     Tenderness: There is no abdominal tenderness.  Genitourinary:    Comments: There is swelling of the scrotum, right greater than left.  Feels as a fluid-filled sac.  No firmness.  Some tenderness on exam.  Minimal direct testicular tenderness.  There is also tenderness in the inguinal canal.  There is no discharge at the meatus.  No inguinal adenopathy. Neurological:     Mental Status: He is alert.      UC Treatments / Results  Labs (all labs ordered are listed, but only abnormal results are displayed) Labs Reviewed - No data to display  EKG   Radiology No results found.  Procedures Procedures (including critical care time)  Medications Ordered in UC Medications - No data to display  Initial Impression / Assessment and Plan / UC Course  I have reviewed the triage vital signs and the nursing notes.  Pertinent labs & imaging results that were available during my care of the patient were reviewed by me and considered in my medical decision making (see chart for details).     # scrotal swelling Patient 69 year old gentleman presenting with scrotal swelling.  Given long chronicity and recurrence, doubt torsion.  More likely hydrocele versus hernia, however do doubt strangulation.  Doubt epididymitis.  Do not see any emergent needs for ultrasound tonight.  Case discussed and patient examined by attending physician Dr. Tracie Harrier and he agrees with the above.  We will have patient call  urology first thing tomorrow take soonest available appointment with strict emergency department precautions.  Patient verbalized understanding plan of care. Final Clinical Impressions(s) / UC Diagnoses   Final diagnoses:  Scrotal swelling     Discharge Instructions  Call urology tomorrow and take first available appointment  Wear supportive underwear  Take tylenol for pain  If severely worsening over the weekend, return or go to the Emergency Department      ED Prescriptions    Medication Sig Dispense Auth. Provider   acetaminophen (TYLENOL) 500 MG tablet Take 2 tablets (1,000 mg total) by mouth every 8 (eight) hours as needed. 30 tablet Adleigh Mcmasters, Veryl Speak, PA-C     PDMP not reviewed this encounter.   Hermelinda Medicus, PA-C 11/09/19 1840    Mauricio Dahlen, Veryl Speak, PA-C 11/09/19 1840

## 2019-11-15 ENCOUNTER — Encounter: Payer: Self-pay | Admitting: Family

## 2019-11-21 ENCOUNTER — Other Ambulatory Visit: Payer: Self-pay

## 2019-11-21 ENCOUNTER — Encounter: Payer: Self-pay | Admitting: Adult Health

## 2019-11-21 ENCOUNTER — Ambulatory Visit (INDEPENDENT_AMBULATORY_CARE_PROVIDER_SITE_OTHER): Payer: Medicare Other | Admitting: Adult Health

## 2019-11-21 VITALS — BP 180/100 | HR 83 | Temp 98.0°F | Ht 67.0 in | Wt 180.2 lb

## 2019-11-21 DIAGNOSIS — I10 Essential (primary) hypertension: Secondary | ICD-10-CM

## 2019-11-21 DIAGNOSIS — Z21 Asymptomatic human immunodeficiency virus [HIV] infection status: Secondary | ICD-10-CM

## 2019-11-21 DIAGNOSIS — Z7689 Persons encountering health services in other specified circumstances: Secondary | ICD-10-CM

## 2019-11-21 DIAGNOSIS — Z1211 Encounter for screening for malignant neoplasm of colon: Secondary | ICD-10-CM

## 2019-11-21 DIAGNOSIS — N433 Hydrocele, unspecified: Secondary | ICD-10-CM

## 2019-11-21 DIAGNOSIS — E785 Hyperlipidemia, unspecified: Secondary | ICD-10-CM

## 2019-11-21 DIAGNOSIS — K4090 Unilateral inguinal hernia, without obstruction or gangrene, not specified as recurrent: Secondary | ICD-10-CM

## 2019-11-21 DIAGNOSIS — B353 Tinea pedis: Secondary | ICD-10-CM

## 2019-11-21 MED ORDER — FLUCONAZOLE POWD: 1 refills | Status: DC

## 2019-11-21 MED ORDER — LISINOPRIL-HYDROCHLOROTHIAZIDE 20-25 MG PO TABS: 1.0000 | ORAL_TABLET | Freq: Every day | ORAL | 3 refills | Status: DC

## 2019-11-21 NOTE — Patient Instructions (Signed)
It was great seeing you today   I am going to refer you to get a colonoscopy, see a urologist and be seen for the Hernia   I will send in your prescriptions   Please follow up with me in a month for your physical

## 2019-11-21 NOTE — Progress Notes (Signed)
Patient presents to clinic today to establish care. He is a very pleasant 69 year old male who  has a past medical history of Hepatitis C, chronic (HCC), HIV positive (HCC), and Hypertension.  Acute Concerns: Establish Care   Scrotal Swelling -he was seen at urgent care on 11/09/2019 with right-sided testicular swelling.  He reports that this has been an issue on and off for the last several years.  Urgent care thought to be likely a hydrocele versus hernia.  Athletes Foot -has been present for multiple years.  He is used topical antifungal creams and sprays without success.  He does work in a hot environment currently at [employer] and wears thick socks as well as work boots.   Chronic Issues: HIV -is seen by infectious disease.  Currently prescribed Triumeq daily.  He does not miss doses of his medication.  He denies any side effects or concerns with this medication. Been HIV positive since 2010.   History of Hep C - treated and cured with Harvoni?  Essential Hypertension -currently prescribed lisinopril/hydrochlorothiazide 20-25 mg.  He has not picked up his medications from the pharmacy and is not sure if they have it as he never received a call to pick it up. He has been taking propanolol which was discontinued  BP Readings from Last 3 Encounters:  11/21/19 (!) 180/100  11/09/19 (!) 168/106  09/29/19 (!) 189/113    Hyperlipidemia - has been prescribed Atorvastatin 40 mg in the past but is no longer taking  Lab Results  Component Value Date   CHOL 175 04/27/2018   HDL 37 (L) 04/27/2018   LDLCALC 120 (H) 04/27/2018   TRIG 85 04/27/2018   CHOLHDL 4.7 04/27/2018   Health Maintenance: Immunizations UTD Colonoscopy -- Never had   Past Medical History:  Diagnosis Date  . Hepatitis C, chronic (HCC)   . HIV positive (HCC)   . Hypertension     No past surgical history on file.  Current Outpatient Medications on File Prior to Visit  Medication Sig Dispense Refill  .  acetaminophen (TYLENOL) 500 MG tablet Take 2 tablets (1,000 mg total) by mouth every 8 (eight) hours as needed. 30 tablet 0  . escitalopram (LEXAPRO) 10 MG tablet Take 10 mg by mouth daily.    [name]. ibuprofen (ADVIL) 600 MG tablet Take 1 tablet (600 mg total) by mouth every 6 (six) hours as needed. 30 tablet 0  . traZODone (DESYREL) 50 MG tablet Take 1 tablet (50 mg total) by mouth at bedtime. 30 tablet 3  . TRIUMEQ 600-50-300 MG tablet TAKE ONE Tablet BY MOUTH EVERY DAY 30 tablet 5   No current facility-administered medications on file prior to visit.    No Known Allergies  Family History  Problem Relation Age of Onset  . Cancer Mother   . Hypertension Mother   . Cancer Father   . Hypertension Father     Social History   Socioeconomic History  . Marital status: Single    Spouse name: Not on file  . Number of children: Not on file  . Years of education: Not on file  . Highest education level: Not on file  Occupational History  . Not on file  Tobacco Use  . Smoking status: Light Tobacco Smoker    Packs/day: 0.15    Types: Cigarettes  . Smokeless tobacco: Never Used  . Tobacco comment: 3 per day  Vaping Use  . Vaping Use: Never used  Substance and Sexual Activity  .  Alcohol use: No    Alcohol/week: 0.0 standard drinks  . Drug use: No  . Sexual activity: Never    Partners: Female    Comment: given condms  Other Topics Concern  . Not on file  Social History Narrative  . Not on file   Social Determinants of Health   Financial Resource Strain:   . Difficulty of Paying Living Expenses: Not on file  Food Insecurity:   . Worried About Programme researcher, broadcasting/film/video in the Last Year: Not on file  . Ran Out of Food in the Last Year: Not on file  Transportation Needs:   . Lack of Transportation (Medical): Not on file  . Lack of Transportation (Non-Medical): Not on file  Physical Activity:   . Days of Exercise per Week: Not on file  . Minutes of Exercise per Session: Not on file    Stress:   . Feeling of Stress : Not on file  Social Connections:   . Frequency of Communication with Friends and Family: Not on file  . Frequency of Social Gatherings with Friends and Family: Not on file  . Attends Religious Services: Not on file  . Active Member of Clubs or Organizations: Not on file  . Attends Banker Meetings: Not on file  . Marital Status: Not on file  Intimate Partner Violence:   . Fear of Current or Ex-Partner: Not on file  . Emotionally Abused: Not on file  . Physically Abused: Not on file  . Sexually Abused: Not on file    Review of Systems  Constitutional: Negative.   HENT: Negative.   Eyes: Negative.   Respiratory: Negative.   Cardiovascular: Negative.   Genitourinary: Negative.        Right scrotal swelling   Musculoskeletal: Negative.   Skin: Positive for rash.  Neurological: Negative.   Endo/Heme/Allergies: Negative.   Psychiatric/Behavioral: Negative.   All other systems reviewed and are negative.   BP (!) 180/100 (BP Location: Right Arm, Patient Position: Sitting, Cuff Size: Normal)   Pulse 83   Temp 98 F (36.7 C) (Oral)   Ht 5\' 7"  (1.702 m)   Wt 180 lb 3.2 oz (81.7 kg)   SpO2 95%   BMI 28.22 kg/m   Physical Exam Vitals and nursing note reviewed.  Constitutional:      General: He is not in acute distress.    Appearance: Normal appearance. He is well-developed and normal weight.  HENT:     Head: Normocephalic and atraumatic.     Right Ear: Tympanic membrane, ear canal and external ear normal. There is no impacted cerumen.     Left Ear: Tympanic membrane, ear canal and external ear normal. There is no impacted cerumen.     Nose: Nose normal. No congestion or rhinorrhea.     Mouth/Throat:     Mouth: Mucous membranes are moist.     Pharynx: Oropharynx is clear. No oropharyngeal exudate or posterior oropharyngeal erythema.  Eyes:     General:        Right eye: No discharge.        Left eye: No discharge.      Extraocular Movements: Extraocular movements intact.     Conjunctiva/sclera: Conjunctivae normal.     Pupils: Pupils are equal, round, and reactive to light.  Neck:     Vascular: No carotid bruit.     Trachea: No tracheal deviation.  Cardiovascular:     Rate and Rhythm: Normal rate and regular rhythm.  Pulses: Normal pulses.     Heart sounds: Normal heart sounds. No murmur heard.  No friction rub. No gallop.   Pulmonary:     Effort: Pulmonary effort is normal. No respiratory distress.     Breath sounds: Normal breath sounds. No stridor. No wheezing, rhonchi or rales.  Chest:     Chest wall: No tenderness.  Abdominal:     General: Bowel sounds are normal. There is no distension.     Palpations: Abdomen is soft. There is no mass.     Tenderness: There is no abdominal tenderness. There is no right CVA tenderness, left CVA tenderness, guarding or rebound.     Hernia: A hernia is present. Hernia is present in the right inguinal area.     Comments: Right inguinal hernia that is reducible but has pain when doing so   Genitourinary:    Testes:        Right: Tenderness, swelling and testicular hydrocele present. Mass or varicocele not present.        Left: Testicular hydrocele or varicocele not present.     Epididymis:     Right: Normal.     Left: Normal.  Musculoskeletal:        General: No swelling, tenderness, deformity or signs of injury. Normal range of motion.     Right lower leg: No edema.     Left lower leg: No edema.  Lymphadenopathy:     Cervical: No cervical adenopathy.  Skin:    General: Skin is warm and dry.     Capillary Refill: Capillary refill takes less than 2 seconds.     Coloration: Skin is not jaundiced or pale.     Findings: Rash present. No bruising, erythema or lesion.     Comments: Tinea infection between all toes   Neurological:     General: No focal deficit present.     Mental Status: He is alert and oriented to person, place, and time.     Cranial  Nerves: No cranial nerve deficit.     Sensory: No sensory deficit.     Motor: No weakness.     Coordination: Coordination normal.     Gait: Gait normal.     Deep Tendon Reflexes: Reflexes normal.  Psychiatric:        Mood and Affect: Mood normal.        Behavior: Behavior normal.        Thought Content: Thought content normal.        Judgment: Judgment normal.     Recent Results (from the past 2160 hour(s))  T-helper cell (CD4)- ([facility] only)     Status: None   Collection Time: 08/30/19  3:03 PM  Result Value Ref Range   CD4 T Cell Abs 949 400 - 1,790 /uL   CD4 % Helper T Cell 46 33 - 65 %    Comment: Performed at [facility], [address], [place], [place] [address]  HIV-1 RNA quant-no reflex-bld     Status: Abnormal   Collection Time: 08/30/19  3:08 PM  Result Value Ref Range   HIV 1 RNA Quant <20 DETECTED (A) NOT DETECT copies/mL   HIV-1 RNA Quant, Log <1.30 DETECTED (A) NOT DETECT Log copies/mL    Comment: . HIV-1 RNA was detected below 20 copies/mL. Viral nucleic acid detected below this level cannot be quantified by the assay.  . . This test was performed using Real-Time Polymerase Chain Reaction. . Reportable Range: 20 copies/mL  to 10,000,000 copies/mL (1.30 log copies/mL to 7.00 log copies/mL).   RPR     Status: None   Collection Time: 08/30/19  3:08 PM  Result Value Ref Range   RPR Ser Ql NON-REACTIVE NON-REACTI  COMPLETE METABOLIC PANEL WITH GFR     Status: Abnormal   Collection Time: 08/30/19  3:08 PM  Result Value Ref Range   Glucose, Bld 121 (H) 65 - 99 mg/dL    Comment: .            Fasting reference interval . For someone without known diabetes, a glucose value between 100 and 125 mg/dL is consistent with prediabetes and should be confirmed with a follow-up test. .    BUN 19 7 - 25 mg/dL   Creat 1.61 (H) 0.96 - 1.25 mg/dL    Comment: For patients >16 years of age, the reference limit for Creatinine is approximately  13% higher for people identified as African-American. .    GFR, Est Non African American 51 (L) > OR = 60 mL/min/1.59m2   GFR, Est African American 59 (L) > OR = 60 mL/min/1.65m2   BUN/Creatinine Ratio 13 6 - 22 (calc)   Sodium 139 135 - 146 mmol/L   Potassium 4.1 3.5 - 5.3 mmol/L   Chloride 108 98 - 110 mmol/L   CO2 20 20 - 32 mmol/L   Calcium 9.4 8.6 - 10.3 mg/dL   Total Protein 7.8 6.1 - 8.1 g/dL   Albumin 4.0 3.6 - 5.1 g/dL   Globulin 3.8 (H) 1.9 - 3.7 g/dL (calc)   AG Ratio 1.1 1.0 - 2.5 (calc)   Total Bilirubin 0.6 0.2 - 1.2 mg/dL   Alkaline phosphatase (APISO) 97 35 - 144 U/L   AST 20 10 - 35 U/L   ALT 22 9 - 46 U/L  CBC with Differential/Platelet     Status: Abnormal   Collection Time: 08/30/19  3:08 PM  Result Value Ref Range   WBC 4.5 3.8 - 10.8 Thousand/uL   RBC 4.37 4.20 - 5.80 Million/uL   Hemoglobin 14.5 13.2 - 17.1 g/dL   HCT 04.5 38 - 50 %   MCV 96.8 80.0 - 100.0 fL   MCH 33.2 (H) 27.0 - 33.0 pg   MCHC 34.3 32.0 - 36.0 g/dL   RDW 40.9 81.1 - 91.4 %   Platelets 288 140 - 400 Thousand/uL   MPV 9.9 7.5 - 12.5 fL   Neutro Abs 1,805 1,500 - 7,800 cells/uL   Lymphs Abs 2,129 850 - 3,900 cells/uL   Absolute Monocytes 347 200 - 950 cells/uL   Eosinophils Absolute 189 15 - 500 cells/uL   Basophils Absolute 32 0 - 200 cells/uL   Neutrophils Relative % 40.1 %   Total Lymphocyte 47.3 %   Monocytes Relative 7.7 %   Eosinophils Relative 4.2 %   Basophils Relative 0.7 %    Assessment/Plan:  1. Encounter to establish care - Follow up for CPE or sooner if needed  2. Dyslipidemia -Check his cholesterol panel at his CPE prior to initiating therapy again  3. Essential hypertension -I would like to see what his blood pressure is on lisinopril hydrochlorothiazide.  He will come back in 3 to 4 weeks for his CPE and at that time we may need to consider adding Norvasc. - lisinopril-hydrochlorothiazide (ZESTORETIC) 20-25 MG tablet; Take 1 tablet by mouth daily.  Dispense:  90 tablet; Refill: 3  4. Asymptomatic HIV infection (HCC) - Follow up with ID as directed  5. Hydrocele in adult  - Ambulatory referral to Urology - US Scrotum; Future  6. Colon cancer screening  - Ambulatory referral to Gastroenterology  7. Non-recurrent unilateral inguinal hernia without obstruction or gangrene  - Ambulatory referral to General Surgery  8. Tinea pedis of both feet -Eyes apply powder to his feet in the morning as well as into his socks before he goes to work and apply between toes when he comes home from work.  He used moisture with skiing socks. - Fluconazole POWD; Apply twice a day between all toes.  Dispense: 1000 g; Refill: 1  Shirline Frees, NP

## 2019-12-08 ENCOUNTER — Ambulatory Visit: Payer: Medicare Other | Admitting: Family Medicine

## 2019-12-12 ENCOUNTER — Ambulatory Visit
Admission: RE | Admit: 2019-12-12 | Discharge: 2019-12-12 | Disposition: A | Discharge status: Home / Self Care | Payer: Medicare Other | Source: Ambulatory Visit | Attending: Adult Health | Admitting: Adult Health

## 2019-12-12 DIAGNOSIS — N452 Orchitis: Secondary | ICD-10-CM | POA: Diagnosis not present

## 2019-12-12 DIAGNOSIS — N433 Hydrocele, unspecified: Secondary | ICD-10-CM

## 2019-12-13 ENCOUNTER — Telehealth: Payer: Self-pay | Admitting: Adult Health

## 2019-12-13 ENCOUNTER — Encounter: Payer: Self-pay | Admitting: Adult Health

## 2019-12-13 NOTE — Telephone Encounter (Signed)
FYI - pt has an appointment tomorrow. His ultrasound results are back.

## 2019-12-13 NOTE — Telephone Encounter (Signed)
error 

## 2019-12-13 NOTE — Telephone Encounter (Signed)
Dianne from [facility] is asking for a call back to give a report on the pt

## 2019-12-14 ENCOUNTER — Encounter: Payer: Self-pay | Admitting: Adult Health

## 2019-12-14 ENCOUNTER — Other Ambulatory Visit: Payer: Self-pay

## 2019-12-14 ENCOUNTER — Ambulatory Visit (INDEPENDENT_AMBULATORY_CARE_PROVIDER_SITE_OTHER): Payer: Medicare Other | Admitting: Adult Health

## 2019-12-14 ENCOUNTER — Other Ambulatory Visit (HOSPITAL_COMMUNITY)
Admission: RE | Admit: 2019-12-14 | Discharge: 2019-12-14 | Disposition: A | Discharge status: Home / Self Care | Payer: Medicare Other | Source: Ambulatory Visit | Attending: Adult Health | Admitting: Adult Health

## 2019-12-14 ENCOUNTER — Encounter: Payer: Self-pay | Admitting: Gastroenterology

## 2019-12-14 VITALS — BP 142/98 | HR 68 | Temp 97.9°F | Ht 69.0 in | Wt 176.0 lb

## 2019-12-14 DIAGNOSIS — E785 Hyperlipidemia, unspecified: Secondary | ICD-10-CM

## 2019-12-14 DIAGNOSIS — Z113 Encounter for screening for infections with a predominantly sexual mode of transmission: Secondary | ICD-10-CM

## 2019-12-14 DIAGNOSIS — Z21 Asymptomatic human immunodeficiency virus [HIV] infection status: Secondary | ICD-10-CM | POA: Diagnosis not present

## 2019-12-14 DIAGNOSIS — Z125 Encounter for screening for malignant neoplasm of prostate: Secondary | ICD-10-CM

## 2019-12-14 DIAGNOSIS — Z23 Encounter for immunization: Secondary | ICD-10-CM | POA: Diagnosis not present

## 2019-12-14 DIAGNOSIS — N452 Orchitis: Secondary | ICD-10-CM

## 2019-12-14 DIAGNOSIS — Z0000 Encounter for general adult medical examination without abnormal findings: Secondary | ICD-10-CM

## 2019-12-14 DIAGNOSIS — I10 Essential (primary) hypertension: Secondary | ICD-10-CM

## 2019-12-14 MED ORDER — TAMSULOSIN HCL 0.4 MG PO CAPS: 0.4000 mg | ORAL_CAPSULE | Freq: Every day | ORAL | 3 refills | Status: DC

## 2019-12-14 MED ORDER — LISINOPRIL 10 MG PO TABS: 10.0000 mg | ORAL_TABLET | Freq: Every day | ORAL | 3 refills | Status: DC

## 2019-12-14 MED ORDER — LISINOPRIL-HYDROCHLOROTHIAZIDE 20-25 MG PO TABS: 1.0000 | ORAL_TABLET | Freq: Every day | ORAL | 3 refills | Status: DC

## 2019-12-14 NOTE — Progress Notes (Signed)
Subjective:    Patient ID: Jamie Clark, male    DOB: 09-08-1950, 69 y.o.   MRN: [id number]  HPI Patient presents for yearly preventative medicine examination. He is a pleasant 69 year old male who  has a past medical history of Hepatitis C, chronic ([place]), HIV positive ([place]), and Hypertension.   HIV -is seen by infectious disease.  Currently prescribed Triumeq daily.  Denies any missed doses of this medication.  He has not experienced any side effects.  Essential Hypertension -currently prescribed lisinopril/hydrochlorothiazide 20-25 mg.  BP Readings from Last 3 Encounters:  12/14/19 (!) 142/98  11/21/19 (!) 180/100  11/09/19 (!) 168/106    Hyperlipidemia-has been prescribed atorvastatin 40 mg in the past but stopped taking this medication for unknown reasons.  He wanted to see what his cholesterol panel was before starting Lipitor again  Insomnia - takes trazodone 50 mg nightly.  He feels as though this medication works well for him.  Scrotal Swelling -was seen at urgent care on 11/09/2019 with right-sided testicular swelling.  During his establish care visit he reported that this has been an issue on and off for several years and urgent care thought it was likely to be a hydrocele versus hernia.  He had an ultrasound done which resulted yesterday and showed  IMPRESSION: 1. RIGHT testicle: Area along the margin of the testis potentially within the epididymis though on some images difficult to separate from the testis along the rete testis. Finding may represent a small cyst in the epididymis and mild epididymal heterogeneity though adjacent lesion is difficult to exclude on submitted images. 2. Focal area selected on images from the LEFT testis on a background of microlithiasis also may be in close proximity to the rete testis but not clear on some of the submitted images. On image 41 this appears more a discrete and may have some associated color flow. Significance uncertain  given overall heterogeneity. 3. Given above findings and question of small lesions within the heterogeneous testicles bilaterally would correlate with any history of or suspected evidence clinically of orchitis. If this is present would consider treatment. Following treatment or in the short interval in 4-6 weeks would suggest repeat examination to be performed with CINE images through these areas to establish firm anatomic localization and determine relationships to rete testis in surrounding structures. Currently differential would be focal orchitis, focal testicular lesion particularly on the LEFT or dilated rete testis on the LEFT amidst microlithiasis and epididymal findings on the RIGHT adjacent to the mediastinum testis. Short interval follow-up as outlined above will be necessary to exclude the possibility of focal intra testicular lesion. 4. Bilateral hydroceles with low level internal echoes, increased since the prior study.  There is some concern for possible STD and he would like to be tested.   BPH - has been on Flomax in the past but stopped taking this medication for unknown reasons. He reports that his symptoms have come back and he is getting up 3-4 times a night to urinate. Denies other symptoms.   All immunizations and health maintenance protocols were reviewed with the patient and needed orders were placed. Due for TDAP  Appropriate screening laboratory values were ordered for the patient including screening of hyperlipidemia, renal function and hepatic function. If indicated by BPH, a PSA was ordered.  Medication reconciliation,  past medical history, social history, problem list and allergies were reviewed in detail with the patient  Goals were established with regard to weight loss, exercise, and  diet in compliance with medications Wt Readings from Last 3 Encounters:  11/21/19 180 lb 3.2 oz (81.7 kg)  09/29/19 179 lb 3.2 oz (81.3 kg)  08/30/19 178 lb (80.7 kg)     Review of Systems  Constitutional: Negative.   HENT: Negative.   Eyes: Negative.   Respiratory: Negative.   Cardiovascular: Negative.   Gastrointestinal: Negative.   Endocrine: Negative.   Genitourinary: Positive for frequency and scrotal swelling. Negative for testicular pain.  Musculoskeletal: Negative.   Skin: Negative.   Allergic/Immunologic: Negative.   Neurological: Negative.   Hematological: Negative.   Psychiatric/Behavioral: Negative.   All other systems reviewed and are negative.  Past Medical History:  Diagnosis Date   Hepatitis C, chronic (HCC)    HIV positive ([place])    Hypertension     Social History   Socioeconomic History   Marital status: Single    Spouse name: Not on file   Number of children: Not on file   Years of education: Not on file   Highest education level: Not on file  Occupational History   Not on file  Tobacco Use   Smoking status: Light Tobacco Smoker    Packs/day: 0.15    Types: Cigarettes   Smokeless tobacco: Never Used   Tobacco comment: 3 per day  Vaping Use   Vaping Use: Never used  Substance and Sexual Activity   Alcohol use: No    Alcohol/week: 0.0 standard drinks   Drug use: No   Sexual activity: Never    Partners: Female    Comment: given condms  Other Topics Concern   Not on file  Social History Narrative   Not on file   Social Determinants of Health   Financial Resource Strain:    Difficulty of Paying Living Expenses: Not on file  Food Insecurity:    Worried About Charity fundraiser in the Last Year: Not on file   [employer] of Food in the Last Year: Not on file  Transportation Needs:    Lack of Transportation (Medical): Not on file   Lack of Transportation (Non-Medical): Not on file  Physical Activity:    Days of Exercise per Week: Not on file   Minutes of Exercise per Session: Not on file  Stress:    Feeling of Stress : Not on file  Social Connections:    Frequency of  Communication with Friends and Family: Not on file   Frequency of Social Gatherings with Friends and Family: Not on file   Attends Religious Services: Not on file   Active Member of Clubs or Organizations: Not on file   Attends Archivist Meetings: Not on file   Marital Status: Not on file  Intimate Partner Violence:    Fear of Current or Ex-Partner: Not on file   Emotionally Abused: Not on file   Physically Abused: Not on file   Sexually Abused: Not on file    No past surgical history on file.  Family History  Problem Relation Age of Onset   Cancer Mother    Hypertension Mother    Cancer Father    Hypertension Father     No Known Allergies  Current Outpatient Medications on File Prior to Visit  Medication Sig Dispense Refill   acetaminophen (TYLENOL) 500 MG tablet Take 2 tablets (1,000 mg total) by mouth every 8 (eight) hours as needed. 30 tablet 0   escitalopram (LEXAPRO) 10 MG tablet Take 10 mg by mouth daily.  Fluconazole POWD Apply twice a day between all toes. 1000 g 1   ibuprofen (ADVIL) 600 MG tablet Take 1 tablet (600 mg total) by mouth every 6 (six) hours as needed. 30 tablet 0   lisinopril-hydrochlorothiazide (ZESTORETIC) 20-25 MG tablet Take 1 tablet by mouth daily. 90 tablet 3   traZODone (DESYREL) 50 MG tablet Take 1 tablet (50 mg total) by mouth at bedtime. 30 tablet 3   TRIUMEQ 600-50-300 MG tablet TAKE ONE Tablet BY MOUTH EVERY DAY 30 tablet 5   No current facility-administered medications on file prior to visit.    There were no vitals taken for this visit.      Objective:   Physical Exam Vitals and nursing note reviewed.  Constitutional:      General: He is not in acute distress.    Appearance: Normal appearance. He is well-developed and normal weight.  HENT:     Head: Normocephalic and atraumatic.     Right Ear: Tympanic membrane, ear canal and external ear normal. There is no impacted cerumen.     Left Ear:  Tympanic membrane, ear canal and external ear normal. There is no impacted cerumen.     Nose: Nose normal. No congestion or rhinorrhea.     Mouth/Throat:     Mouth: Mucous membranes are moist.     Pharynx: Oropharynx is clear. No oropharyngeal exudate or posterior oropharyngeal erythema.  Eyes:     General:        Right eye: No discharge.        Left eye: No discharge.     Extraocular Movements: Extraocular movements intact.     Conjunctiva/sclera: Conjunctivae normal.     Pupils: Pupils are equal, round, and reactive to light.  Neck:     Vascular: No carotid bruit.     Trachea: No tracheal deviation.  Cardiovascular:     Rate and Rhythm: Normal rate and regular rhythm.     Pulses: Normal pulses.     Heart sounds: Normal heart sounds. No murmur heard.  No friction rub. No gallop.   Pulmonary:     Effort: Pulmonary effort is normal. No respiratory distress.     Breath sounds: Normal breath sounds. No stridor. No wheezing, rhonchi or rales.  Chest:     Chest wall: No tenderness.  Abdominal:     General: Bowel sounds are normal. There is no distension.     Palpations: Abdomen is soft. There is no mass.     Tenderness: There is no abdominal tenderness. There is no right CVA tenderness, left CVA tenderness, guarding or rebound.     Hernia: No hernia is present.  Genitourinary:    Testes:        Right: Tenderness, swelling and testicular hydrocele present.        Left: Tenderness, swelling and testicular hydrocele present.  Musculoskeletal:        General: No swelling, tenderness, deformity or signs of injury. Normal range of motion.     Right lower leg: No edema.     Left lower leg: No edema.  Lymphadenopathy:     Cervical: No cervical adenopathy.  Skin:    General: Skin is warm and dry.     Capillary Refill: Capillary refill takes less than 2 seconds.     Coloration: Skin is not jaundiced or pale.     Findings: No bruising, erythema, lesion or rash.  Neurological:      General: No focal deficit present.  Mental Status: He is alert and oriented to person, place, and time.     Cranial Nerves: No cranial nerve deficit.     Sensory: No sensory deficit.     Motor: No weakness.     Coordination: Coordination normal.     Gait: Gait normal.     Deep Tendon Reflexes: Reflexes normal.  Psychiatric:        Mood and Affect: Mood normal.        Behavior: Behavior normal.        Thought Content: Thought content normal.        Judgment: Judgment normal.       Assessment & Plan:  1. Routine general medical examination at a health care facility - encouraged heart healthy diet and exercise - Follow up in one year or sooner if needed - CBC with Differential/Platelet; Future - Hemoglobin A1c; Future - Lipid panel; Future - TSH; Future - PSA; Future - CMP with eGFR([facility]); Future - Urinalysis; Future - CMP with eGFR([facility]) - PSA - TSH - Lipid panel - Hemoglobin A1c - CBC with Differential/Platelet - Urinalysis  2. Dyslipidemia - Likely need statin  - CBC with Differential/Platelet; Future - Hemoglobin A1c; Future - Lipid panel; Future - TSH; Future - PSA; Future - CMP with eGFR([facility]); Future - Urinalysis; Future - CMP with eGFR([facility]) - PSA - TSH - Lipid panel - Hemoglobin A1c - CBC with Differential/Platelet - Urinalysis  3. Essential hypertension - BP better but not at goal. Will add lisinopril 10 mg  - CBC with Differential/Platelet; Future - Hemoglobin A1c; Future - Lipid panel; Future - TSH; Future - PSA; Future - CMP with eGFR([facility]); Future - Urinalysis; Future - lisinopril-hydrochlorothiazide (ZESTORETIC) 20-25 MG tablet; Take 1 tablet by mouth daily.  Dispense: 90 tablet; Refill: 3 - lisinopril (ZESTRIL) 10 MG tablet; Take 1 tablet (10 mg total) by mouth daily.  Dispense: 90 tablet; Refill: 3 - CMP with eGFR([facility]) - PSA - TSH - Lipid panel - Hemoglobin A1c - CBC with Differential/Platelet - Urinalysis  4. Asymptomatic  HIV infection ([place]) - Follow up with ID   5. Screen for STD (sexually transmitted disease)  - Urine cytology ancillary only; Future - Urine cytology ancillary only  6. Prostate cancer screening - Restart on FLomax - PSA; Future - tamsulosin (FLOMAX) 0.4 MG CAPS capsule; Take 1 capsule (0.4 mg total) by mouth daily.  Dispense: 90 capsule; Refill: 3 - PSA  7. Orchitis of both testicles - Phone number given to call for Urology appointment  - Will check urine cytology and then treat with appropriate abx   8. Need for diphtheria-tetanus-pertussis (Tdap) vaccine  - Tdap vaccine greater than or equal to 7yo IM  Dorothyann Peng, NP

## 2019-12-14 NOTE — Patient Instructions (Addendum)
It was great seeing you today   I have added 10 mg of lisinopril to your regimen to help bring down your blood pressure a little more   We will follow up with you regarding your blood work   Someone will call you to schedule you repeat imaging of the scrotum   Please call Urology    [facility]  referral placed on [facility]  Address: [address] [place], [place] [address] Phone: [phone]  Please call Gastroenterology to schedule your colonoscopy  Address: [address] [place], [place], [place] [address] Phone: [phone]

## 2019-12-15 ENCOUNTER — Telehealth: Payer: Self-pay | Admitting: Adult Health

## 2019-12-15 LAB — PSA: PSA: 7.33 ng/mL — ABNORMAL HIGH (ref <=4.0)

## 2019-12-15 LAB — COMPLETE METABOLIC PANEL WITH GFR
AG Ratio: 1.1 (calc) (ref 1.0–2.5)
ALT: 19 U/L (ref 9–46)
AST: 22 U/L (ref 10–35)
Albumin: 4.1 g/dL (ref 3.6–5.1)
Alkaline phosphatase (APISO): 107 U/L (ref 35–144)
BUN: 14 mg/dL (ref 7–25)
CO2: 25 mmol/L (ref 20–32)
Calcium: 9.3 mg/dL (ref 8.6–10.3)
Chloride: 105 mmol/L (ref 98–110)
Creat: 1.23 mg/dL (ref 0.70–1.25)
GFR, Est African American: 70 mL/min/{1.73_m2} (ref >=60)
GFR, Est Non African American: 60 mL/min/{1.73_m2} (ref >=60)
Globulin: 3.6 g/dL (calc) (ref 1.9–3.7)
Glucose, Bld: 102 mg/dL — ABNORMAL HIGH (ref 65–99)
Potassium: 3.8 mmol/L (ref 3.5–5.3)
Sodium: 138 mmol/L (ref 135–146)
Total Bilirubin: 0.8 mg/dL (ref 0.2–1.2)
Total Protein: 7.7 g/dL (ref 6.1–8.1)

## 2019-12-15 LAB — HEMOGLOBIN A1C
Hgb A1c MFr Bld: 5.7 % of total Hgb — ABNORMAL HIGH (ref <5.7)
Mean Plasma Glucose: 117 (calc)
eAG (mmol/L): 6.5 (calc)

## 2019-12-15 LAB — CBC WITH DIFFERENTIAL/PLATELET
Absolute Monocytes: 394 cells/uL (ref 200–950)
Basophils Absolute: 41 cells/uL (ref 0–200)
Basophils Relative: 1 %
Eosinophils Absolute: 139 cells/uL (ref 15–500)
Eosinophils Relative: 3.4 %
HCT: 39.8 % (ref 38.5–50.0)
Hemoglobin: 14.2 g/dL (ref 13.2–17.1)
Lymphs Abs: 1812 cells/uL (ref 850–3900)
MCH: 35.4 pg — ABNORMAL HIGH (ref 27.0–33.0)
MCHC: 35.7 g/dL (ref 32.0–36.0)
MCV: 99.3 fL (ref 80.0–100.0)
MPV: 9.6 fL (ref 7.5–12.5)
Monocytes Relative: 9.6 %
Neutro Abs: 1714 cells/uL (ref 1500–7800)
Neutrophils Relative %: 41.8 %
Platelets: 264 10*3/uL (ref 140–400)
RBC: 4.01 10*6/uL — ABNORMAL LOW (ref 4.20–5.80)
RDW: 14.3 % (ref 11.0–15.0)
Total Lymphocyte: 44.2 %
WBC: 4.1 10*3/uL (ref 3.8–10.8)

## 2019-12-15 LAB — LIPID PANEL
Cholesterol: 216 mg/dL — ABNORMAL HIGH (ref <200)
HDL: 34 mg/dL — ABNORMAL LOW (ref >=40)
LDL Cholesterol (Calc): 159 mg/dL (calc) — ABNORMAL HIGH
Non-HDL Cholesterol (Calc): 182 mg/dL (calc) — ABNORMAL HIGH (ref <130)
Total CHOL/HDL Ratio: 6.4 (calc) — ABNORMAL HIGH (ref <5.0)
Triglycerides: 113 mg/dL (ref <150)

## 2019-12-15 LAB — URINALYSIS
Bilirubin Urine: NEGATIVE
Glucose, UA: NEGATIVE
Hgb urine dipstick: NEGATIVE
Ketones, ur: NEGATIVE
Leukocytes,Ua: NEGATIVE
Nitrite: NEGATIVE
Protein, ur: NEGATIVE
Specific Gravity, Urine: 1.018 (ref 1.001–1.03)
pH: 5.5 (ref 5.0–8.0)

## 2019-12-15 LAB — URINE CYTOLOGY ANCILLARY ONLY
Chlamydia: NEGATIVE
Comment: NEGATIVE
Comment: NORMAL
Neisseria Gonorrhea: NEGATIVE

## 2019-12-15 LAB — TSH: TSH: 1.55 mIU/L (ref 0.40–4.50)

## 2019-12-15 MED ORDER — LEVOFLOXACIN 750 MG PO TABS: 750.0000 mg | ORAL_TABLET | Freq: Every day | ORAL | 0 refills | Status: DC

## 2019-12-15 NOTE — Telephone Encounter (Signed)
Spoke to patient informed him of his labs.  PSA was elevated, he has been referred to urology already and will call them to see where he stands and make an appointment.  We will send in Levaquin 750 mg daily x14 days for suspected orchitis

## 2020-01-09 DIAGNOSIS — R350 Frequency of micturition: Secondary | ICD-10-CM | POA: Diagnosis not present

## 2020-01-12 DIAGNOSIS — Z03818 Encounter for observation for suspected exposure to other biological agents ruled out: Secondary | ICD-10-CM | POA: Diagnosis not present

## 2020-01-12 DIAGNOSIS — Z20822 Contact with and (suspected) exposure to covid-19: Secondary | ICD-10-CM | POA: Diagnosis not present

## 2020-01-24 ENCOUNTER — Telehealth: Payer: Self-pay

## 2020-01-24 NOTE — Telephone Encounter (Signed)
Left message for patient to call back to rescheduled pre visit appt as he has no showed today's appt;

## 2020-01-30 ENCOUNTER — Other Ambulatory Visit: Payer: Self-pay

## 2020-01-30 ENCOUNTER — Ambulatory Visit (AMBULATORY_SURGERY_CENTER): Payer: Self-pay | Admitting: *Deleted

## 2020-01-30 VITALS — Ht 69.0 in | Wt 176.0 lb

## 2020-01-30 DIAGNOSIS — Z1211 Encounter for screening for malignant neoplasm of colon: Secondary | ICD-10-CM

## 2020-01-30 MED ORDER — SUPREP BOWEL PREP KIT 17.5-3.13-1.6 GM/177ML PO SOLN: 1.0000 | Freq: Once | ORAL | 0 refills | Status: AC

## 2020-01-30 NOTE — Progress Notes (Signed)
J and [facility] 06-19-2019  No egg or soy allergy known to patient  No issues with past sedation with any surgeries or procedures no intubation problems in the past  No FH of Malignant Hyperthermia No diet pills per patient No home 02 use per patient  No blood thinners per patient  Pt denies issues with constipation  No A fib or A flutter  EMMI video to pt or via MyChart  COVID 19 guidelines implemented in PV today with Pt and RN    Due to the COVID-19 pandemic we are asking patients to follow these guidelines. Please only bring one care partner. Please be aware that your care partner may wait in the car in the parking lot or if they feel like they will be too hot to wait in the car, they may wait in the lobby on the 4th floor. All care partners are required to wear a mask the entire time (we do not have any that we can provide them), they need to practice social distancing, and we will do a Covid check for all patient's and care partners when you arrive. Also we will check their temperature and your temperature. If the care partner waits in their car they need to stay in the parking lot the entire time and we will call them on their cell phone when the patient is ready for discharge so they can bring the car to the front of the building. Also all patient's will need to wear a mask into building.

## 2020-02-02 ENCOUNTER — Encounter (HOSPITAL_COMMUNITY): Payer: Self-pay

## 2020-02-02 ENCOUNTER — Ambulatory Visit (HOSPITAL_COMMUNITY)
Admission: EM | Admit: 2020-02-02 | Discharge: 2020-02-02 | Disposition: A | Discharge status: Home / Self Care | Payer: Medicare Other | Attending: Family Medicine | Admitting: Family Medicine

## 2020-02-02 ENCOUNTER — Ambulatory Visit (INDEPENDENT_AMBULATORY_CARE_PROVIDER_SITE_OTHER): Payer: Medicare Other

## 2020-02-02 ENCOUNTER — Other Ambulatory Visit: Payer: Self-pay

## 2020-02-02 DIAGNOSIS — M25511 Pain in right shoulder: Secondary | ICD-10-CM

## 2020-02-02 DIAGNOSIS — S4991XA Unspecified injury of right shoulder and upper arm, initial encounter: Secondary | ICD-10-CM | POA: Diagnosis not present

## 2020-02-02 MED ORDER — IBUPROFEN 600 MG PO TABS: 600.0000 mg | ORAL_TABLET | Freq: Four times a day (QID) | ORAL | 0 refills | Status: DC | PRN

## 2020-02-02 MED ORDER — METHYLPREDNISOLONE 4 MG PO TBPK: ORAL_TABLET | ORAL | 0 refills | Status: DC

## 2020-02-02 NOTE — ED Provider Notes (Signed)
[facility]    CSN: [id number] Arrival date & time: 02/02/20  0827      History   Chief Complaint Chief Complaint  Patient presents with  . Shoulder Pain    HPI Jamie Clark is a 69 y.o. male.   HPI  Patient works at [employer].  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  This requires him to lift with arms away from body, and doing this yesterday he felt a pop and pain in his right shoulder.  He felt like something might have popped out of place, a coworker help pull on his arm, but he did not get relief.  He is here because the shoulder continued painful.  Pain with reaching out away from body or reaching overhead.  Some pain also in the elbow.  No numbness or weakness into the hand.  No fall or other trauma noted.  Denies prior shoulder problems.  Past Medical History:  Diagnosis Date  . Allergy   . Anxiety   . Depression   . Hepatitis C, chronic (HCC)   . HIV positive (HCC)   . Hypertension     Patient Active Problem List   Diagnosis Date Noted  . Grief at loss of child 07/04/2019  . Anxiety 12/22/2018  . Prostatic hypertrophy 12/22/2018  . Prediabetes 08/22/2018  . Screen for STD (sexually transmitted disease) 08/22/2018  . Dyslipidemia 01/17/2009  . HTN (hypertension) 01/17/2009  . HIV infection (HCC) 12/18/2008  . Hepatitis C virus infection cured after antiviral drug therapy 07/13/2007    Past Surgical History:  Procedure Laterality Date  . NO PAST SURGERIES         Home Medications    Prior to Admission medications   Medication Sig Start Date End Date Taking? Authorizing Provider  lisinopril-hydrochlorothiazide (ZESTORETIC) 20-25 MG tablet Take 1 tablet by mouth daily. 12/14/19  Yes Nafziger, Kandee Keen, NP  tamsulosin (FLOMAX) 0.4 MG CAPS capsule Take 1 capsule (0.4 mg total) by mouth daily. 12/14/19  Yes Nafziger, Kandee Keen, NP  TRIUMEQ 600-50-300 MG tablet TAKE ONE Tablet BY MOUTH EVERY DAY 10/12/19   Yes Blanchard Kelch, NP  acetaminophen (TYLENOL) 500 MG tablet Take 2 tablets (1,000 mg total) by mouth every 8 (eight) hours as needed. 11/09/19   Darr, Gerilyn Pilgrim, PA-C  Fluconazole POWD Apply twice a day between all toes. 11/21/19   Nafziger, Kandee Keen, NP  ibuprofen (ADVIL) 600 MG tablet Take 1 tablet (600 mg total) by mouth every 6 (six) hours as needed. 02/02/20   Eustace Moore, MD  methylPREDNISolone (MEDROL DOSEPAK) 4 MG TBPK tablet tad 02/02/20   Eustace Moore, MD    Family History Family History  Problem Relation Age of Onset  . Cancer Mother   . Hypertension Mother   . Esophageal cancer Mother   . Cancer Father   . Hypertension Father   . Colon cancer Neg Hx   . Colon polyps Neg Hx   . Rectal cancer Neg Hx   . Stomach cancer Neg Hx     Social History Social History   Tobacco Use  . Smoking status: Light Tobacco Smoker    Packs/day: 0.15    Types: Cigarettes  . Smokeless tobacco: Never Used  . Tobacco comment: 3 per day  Vaping Use  . Vaping Use: Never used  Substance Use Topics  . Alcohol use: No    Alcohol/week: 0.0 standard drinks  . Drug use: No  Allergies   Patient has no known allergies.   Review of Systems Review of Systems See HPI  Physical Exam Triage Vital Signs ED Triage Vitals  Enc Vitals Group     BP 02/02/20 0859 (!) 175/95     Pulse Rate 02/02/20 0859 72     Resp 02/02/20 0859 18     Temp 02/02/20 0859 97.6 F (36.4 C)     Temp Source 02/02/20 0859 Oral     SpO2 02/02/20 0859 100 %     Weight --      Height --      Head Circumference --      Peak Flow --      Pain Score 02/02/20 0902 7     Pain Loc --      Pain Edu? --      Excl. in GC? --    No data found.  Updated Vital Signs BP (!) 175/95 (BP Location: Left Arm)   Pulse 72   Temp 97.6 F (36.4 C) (Oral)   Resp 18   SpO2 100%     Physical Exam Constitutional:      General: He is not in acute distress.    Appearance: He is well-developed.  HENT:     Head:  Normocephalic and atraumatic.     Mouth/Throat:     Comments: Mask is in place Eyes:     Conjunctiva/sclera: Conjunctivae normal.     Pupils: Pupils are equal, round, and reactive to light.  Cardiovascular:     Rate and Rhythm: Normal rate.  Pulmonary:     Effort: Pulmonary effort is normal. No respiratory distress.  Abdominal:     Palpations: Abdomen is soft.  Musculoskeletal:        General: Normal range of motion.     Cervical back: Normal range of motion.     Comments: Shoulders appear symmetric.  There is no tenderness to palpation.  Patient can abduct and forward extend his shoulder to about 90 degrees.  Pain with external greater than internal rotation.  No pain or limitation in bicep.  Mild tenderness over lateral epicondyle of elbow.  Skin:    General: Skin is warm and dry.  Neurological:     Mental Status: He is alert.  Psychiatric:        Behavior: Behavior normal.      UC Treatments / Results  Labs (all labs ordered are listed, but only abnormal results are displayed) Labs Reviewed - No data to display  EKG   Radiology DG Shoulder Right  Result Date: 02/02/2020 CLINICAL DATA:  Pain and limited range of motion following injury EXAM: RIGHT SHOULDER - 2+ VIEW COMPARISON:  None. FINDINGS: Frontal, oblique, Y scapular, and axillary images were obtained. There is no fracture or dislocation. Joint spaces appear normal. No erosive change or intra-articular calcification. Visualized right lung clear. IMPRESSION: No fracture or dislocation.  No evident arthropathy. Electronically Signed   By: Bretta Bang III M.D.   On: 02/02/2020 09:45    Procedures Procedures (including critical care time)  Medications Ordered in UC Medications - No data to display  Initial Impression / Assessment and Plan / UC Course  I have reviewed the triage vital signs and the nursing notes.  Pertinent labs & imaging results that were available during my care of the patient were  reviewed by me and considered in my medical decision making (see chart for details).     X-rays are normal.  Shoulder strain.  Will treat with Medrol followed by ibuprofen.  Follow-up with sports medicine if fails to improve Final Clinical Impressions(s) / UC Diagnoses   Final diagnoses:  Acute pain of right shoulder     Discharge Instructions     Take the medrol as directed Take all of day one today After the medrol take ibuprofen for pain Use ice to reduce the swelling Expect improvement in a few days    ED Prescriptions    Medication Sig Dispense Auth. Provider   methylPREDNISolone (MEDROL DOSEPAK) 4 MG TBPK tablet tad 21 tablet Eustace Moore, MD   ibuprofen (ADVIL) 600 MG tablet Take 1 tablet (600 mg total) by mouth every 6 (six) hours as needed. 30 tablet Eustace Moore, MD     PDMP not reviewed this encounter.   Eustace Moore, MD 02/02/20 1041

## 2020-02-02 NOTE — ED Triage Notes (Signed)
Pt c/o acute right shoulder pain onset yesterday while at work. 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 Difficulty performing ROM to shoulder.  Also c/o pain to lateral elbow area and difficulty extending and flexing right elbow.  Denies numbness/tingling to right hand/fingers. Fingers warm, brisk cap refill. Was able to remove sweatshirt over head.

## 2020-02-02 NOTE — Discharge Instructions (Signed)
Take the medrol as directed Take all of day one today After the medrol take ibuprofen for pain Use ice to reduce the swelling Expect improvement in a few days

## 2020-02-06 ENCOUNTER — Ambulatory Visit (AMBULATORY_SURGERY_CENTER): Payer: Medicare Other | Admitting: Gastroenterology

## 2020-02-06 ENCOUNTER — Encounter: Payer: Self-pay | Admitting: Gastroenterology

## 2020-02-06 ENCOUNTER — Other Ambulatory Visit: Payer: Self-pay

## 2020-02-06 VITALS — BP 160/98 | HR 68 | Temp 97.1°F | Resp 14 | Ht 69.0 in | Wt 176.0 lb

## 2020-02-06 DIAGNOSIS — Z1211 Encounter for screening for malignant neoplasm of colon: Secondary | ICD-10-CM | POA: Diagnosis not present

## 2020-02-06 MED ORDER — SODIUM CHLORIDE 0.9 % IV SOLN: 500.0000 mL | Freq: Once | INTRAVENOUS | Status: DC

## 2020-02-06 NOTE — Op Note (Signed)
[facility] Patient Name: Jamie Clark Procedure Date: 02/06/2020 2:04 PM MRN: [id number] Endoscopist: Sherilyn Cooter L. Myrtie Neither , MD Age: 69 Referring MD:  Date of Birth: Mar 09, 1951 Gender: Male Account #: [id number] Procedure:                Colonoscopy Indications:              Screening for colorectal malignant neoplasm, This                            is the patient's first colonoscopy Medicines:                Monitored Anesthesia Care Procedure:                Pre-Anesthesia Assessment:                           - Prior to the procedure, a History and Physical                            was performed, and patient medications and                            allergies were reviewed. The patient's tolerance of                            previous anesthesia was also reviewed. The risks                            and benefits of the procedure and the sedation                            options and risks were discussed with the patient.                            All questions were answered, and informed consent                            was obtained. Prior Anticoagulants: The patient has                            taken no previous anticoagulant or antiplatelet                            agents. ASA Grade Assessment: II - A patient with                            mild systemic disease. After reviewing the risks                            and benefits, the patient was deemed in                            satisfactory condition to undergo the procedure.  After obtaining informed consent, the colonoscope                            was passed under direct vision. Throughout the                            procedure, the patient's blood pressure, pulse, and                            oxygen saturations were monitored continuously. The                            Colonoscope was introduced through the anus and                            advanced to the the  cecum, identified by                            appendiceal orifice and ileocecal valve. The                            colonoscopy was somewhat difficult due to a                            redundant colon and significant looping. Successful                            completion of the procedure was aided by changing                            the patient to a supine position and using manual                            pressure. The patient tolerated the procedure well.                            The quality of the bowel preparation was good. The                            ileocecal valve, appendiceal orifice, and rectum                            were photographed. Scope In: 2:09:35 PM Scope Out: 2:27:20 PM Scope Withdrawal Time: 0 hours 9 minutes 33 seconds  Total Procedure Duration: 0 hours 17 minutes 45 seconds  Findings:                 The perianal and digital rectal examinations were                            normal.                           The entire examined colon appeared normal on direct  and retroflexion views. Complications:            No immediate complications. Estimated Blood Loss:     Estimated blood loss: none. Impression:               - The entire examined colon is normal on direct and                            retroflexion views.                           - No specimens collected. Recommendation:           - Patient has a contact number available for                            emergencies. The signs and symptoms of potential                            delayed complications were discussed with the                            patient. Return to normal activities tomorrow.                            Written discharge instructions were provided to the                            patient.                           - Resume previous diet.                           - Continue present medications.                           - Based on age and  current guidelines, no repeat                            screening colonoscopy recommended. Jamie Clark L. Myrtie Neither, MD 02/06/2020 2:30:05 PM This report has been signed electronically.

## 2020-02-06 NOTE — Progress Notes (Signed)
PT taken to PACU. Monitors in place. VSS. Report given to RN. 

## 2020-02-06 NOTE — Progress Notes (Signed)
Pt's states no medical or surgical changes since previsit or office visit.  Cw vitals and JD IV. 

## 2020-02-06 NOTE — Patient Instructions (Signed)
Please read handouts provided. Continue present medications. No repeat screening Colonoscopy recommended.     YOU HAD AN ENDOSCOPIC PROCEDURE TODAY AT THE [facility]:   Refer to the procedure report that was given to you for any specific questions about what was found during the examination.  If the procedure report does not answer your questions, please call your gastroenterologist to clarify.  If you requested that your care partner not be given the details of your procedure findings, then the procedure report has been included in a sealed envelope for you to review at your convenience later.  YOU SHOULD EXPECT: Some feelings of bloating in the abdomen. Passage of more gas than usual.  Walking can help get rid of the air that was put into your GI tract during the procedure and reduce the bloating. If you had a lower endoscopy (such as a colonoscopy or flexible sigmoidoscopy) you may notice spotting of blood in your stool or on the toilet paper. If you underwent a bowel prep for your procedure, you may not have a normal bowel movement for a few days.  Please Note:  You might notice some irritation and congestion in your nose or some drainage.  This is from the oxygen used during your procedure.  There is no need for concern and it should clear up in a day or so.  SYMPTOMS TO REPORT IMMEDIATELY:   Following lower endoscopy (colonoscopy or flexible sigmoidoscopy):  Excessive amounts of blood in the stool  Significant tenderness or worsening of abdominal pains  Swelling of the abdomen that is new, acute  Fever of 100F or higher   For urgent or emergent issues, a gastroenterologist can be reached at any hour by calling [phone]. Do not use MyChart messaging for urgent concerns.    DIET:  We do recommend a small meal at first, but then you may proceed to your regular diet.  Drink plenty of fluids but you should avoid alcoholic beverages for 24 hours.  ACTIVITY:  You  should plan to take it easy for the rest of today and you should NOT DRIVE or use heavy machinery until tomorrow (because of the sedation medicines used during the test).    FOLLOW UP: Our staff will call the number listed on your records 48-72 hours following your procedure to check on you and address any questions or concerns that you may have regarding the information given to you following your procedure. If we do not reach you, we will leave a message.  We will attempt to reach you two times.  During this call, we will ask if you have developed any symptoms of COVID 19. If you develop any symptoms (ie: fever, flu-like symptoms, shortness of breath, cough etc.) before then, please call [phone].  If you test positive for Covid 19 in the 2 weeks post procedure, please call and report this information to [place].    If any biopsies were taken you will be contacted by phone or by letter within the next 1-3 weeks.  Please call us at [phone] if you have not heard about the biopsies in 3 weeks.    SIGNATURES/CONFIDENTIALITY: You and/or your care partner have signed paperwork which will be entered into your electronic medical record.  These signatures attest to the fact that that the information above on your After Visit Summary has been reviewed and is understood.  Full responsibility of the confidentiality of this discharge information lies with you and/or your care-partner.

## 2020-02-08 ENCOUNTER — Telehealth: Payer: Self-pay

## 2020-02-08 NOTE — Telephone Encounter (Signed)
First post procedure follow up call, no answer 

## 2020-02-08 NOTE — Telephone Encounter (Signed)
Second attempt follow up call to pt, lm on vm. 

## 2020-02-14 ENCOUNTER — Other Ambulatory Visit: Payer: Self-pay | Admitting: Urology

## 2020-02-14 ENCOUNTER — Other Ambulatory Visit (HOSPITAL_COMMUNITY): Payer: Self-pay | Admitting: Urology

## 2020-02-14 DIAGNOSIS — N503 Cyst of epididymis: Secondary | ICD-10-CM

## 2020-02-19 ENCOUNTER — Ambulatory Visit (HOSPITAL_COMMUNITY): Payer: Medicare Other

## 2020-02-19 ENCOUNTER — Encounter: Payer: Self-pay | Admitting: Infectious Diseases

## 2020-03-01 ENCOUNTER — Other Ambulatory Visit: Payer: Self-pay

## 2020-03-01 ENCOUNTER — Ambulatory Visit (HOSPITAL_COMMUNITY)
Admission: EM | Admit: 2020-03-01 | Discharge: 2020-03-01 | Disposition: A | Discharge status: Home / Self Care | Payer: Medicare Other | Attending: Emergency Medicine | Admitting: Emergency Medicine

## 2020-03-01 ENCOUNTER — Encounter (HOSPITAL_COMMUNITY): Payer: Self-pay | Admitting: Emergency Medicine

## 2020-03-01 DIAGNOSIS — R7303 Prediabetes: Secondary | ICD-10-CM | POA: Diagnosis not present

## 2020-03-01 DIAGNOSIS — Z21 Asymptomatic human immunodeficiency virus [HIV] infection status: Secondary | ICD-10-CM | POA: Insufficient documentation

## 2020-03-01 DIAGNOSIS — R059 Cough, unspecified: Secondary | ICD-10-CM | POA: Insufficient documentation

## 2020-03-01 DIAGNOSIS — J301 Allergic rhinitis due to pollen: Secondary | ICD-10-CM | POA: Diagnosis not present

## 2020-03-01 DIAGNOSIS — K5900 Constipation, unspecified: Secondary | ICD-10-CM | POA: Insufficient documentation

## 2020-03-01 DIAGNOSIS — Z20822 Contact with and (suspected) exposure to covid-19: Secondary | ICD-10-CM | POA: Diagnosis not present

## 2020-03-01 DIAGNOSIS — F17210 Nicotine dependence, cigarettes, uncomplicated: Secondary | ICD-10-CM | POA: Insufficient documentation

## 2020-03-01 DIAGNOSIS — J069 Acute upper respiratory infection, unspecified: Secondary | ICD-10-CM | POA: Diagnosis not present

## 2020-03-01 DIAGNOSIS — B182 Chronic viral hepatitis C: Secondary | ICD-10-CM | POA: Insufficient documentation

## 2020-03-01 DIAGNOSIS — F419 Anxiety disorder, unspecified: Secondary | ICD-10-CM | POA: Diagnosis not present

## 2020-03-01 DIAGNOSIS — F32A Depression, unspecified: Secondary | ICD-10-CM | POA: Insufficient documentation

## 2020-03-01 DIAGNOSIS — Z79899 Other long term (current) drug therapy: Secondary | ICD-10-CM | POA: Diagnosis not present

## 2020-03-01 DIAGNOSIS — I10 Essential (primary) hypertension: Secondary | ICD-10-CM | POA: Diagnosis not present

## 2020-03-01 LAB — RESP PANEL BY RT-PCR (FLU A&B, COVID) ARPGX2
Influenza A by PCR: NEGATIVE
Influenza B by PCR: NEGATIVE
SARS Coronavirus 2 by RT PCR: NEGATIVE

## 2020-03-01 NOTE — ED Provider Notes (Signed)
[facility]    CSN: [id number] Arrival date & time: 03/01/20  0800      History   Chief Complaint Chief Complaint  Patient presents with  . Cough  . Constipation    HPI Jamie Clark is a 69 y.o. male.   Patient presents with 1 day history of nasal congestion, nonproductive cough, chills.  He also reports feeling constipated; last bowel movement 2 days ago.  He denies fever, rash, shortness of breath, abdominal pain, vomiting, diarrhea, or other symptoms.  Treatment attempted at home with NyQuil and Sudafed.  His medical history includes hypertension, hepatitis C, HIV, prediabetes, anxiety, depression, allergies.  The history is provided by the patient and medical records.    Past Medical History:  Diagnosis Date  . Allergy   . Anxiety   . Depression   . Hepatitis C, chronic (HCC)   . HIV positive (HCC)   . Hypertension     Patient Active Problem List   Diagnosis Date Noted  . Grief at loss of child 07/04/2019  . Anxiety 12/22/2018  . Prostatic hypertrophy 12/22/2018  . Prediabetes 08/22/2018  . Screen for STD (sexually transmitted disease) 08/22/2018  . Dyslipidemia 01/17/2009  . HTN (hypertension) 01/17/2009  . HIV infection (HCC) 12/18/2008  . Hepatitis C virus infection cured after antiviral drug therapy 07/13/2007    Past Surgical History:  Procedure Laterality Date  . NO PAST SURGERIES         Home Medications    Prior to Admission medications   Medication Sig Start Date End Date Taking? Authorizing Provider  acetaminophen (TYLENOL) 500 MG tablet Take 2 tablets (1,000 mg total) by mouth every 8 (eight) hours as needed. 11/09/19   Darr, Gerilyn Pilgrim, PA-C  Fluconazole POWD Apply twice a day between all toes. 11/21/19   Nafziger, Kandee Keen, NP  ibuprofen (ADVIL) 600 MG tablet Take 1 tablet (600 mg total) by mouth every 6 (six) hours as needed. 02/02/20   Eustace Moore, MD  lisinopril-hydrochlorothiazide (ZESTORETIC) 20-25 MG tablet Take 1  tablet by mouth daily. 12/14/19   Nafziger, Kandee Keen, NP  methylPREDNISolone (MEDROL DOSEPAK) 4 MG TBPK tablet tad 02/02/20   Eustace Moore, MD  tamsulosin (FLOMAX) 0.4 MG CAPS capsule Take 1 capsule (0.4 mg total) by mouth daily. 12/14/19   Shirline Frees, NP  TRIUMEQ 600-50-300 MG tablet TAKE ONE Tablet BY MOUTH EVERY DAY 10/12/19   Blanchard Kelch, NP    Family History Family History  Problem Relation Age of Onset  . Cancer Mother   . Hypertension Mother   . Esophageal cancer Mother   . Cancer Father   . Hypertension Father   . Colon cancer Neg Hx   . Colon polyps Neg Hx   . Rectal cancer Neg Hx   . Stomach cancer Neg Hx     Social History Social History   Tobacco Use  . Smoking status: Light Tobacco Smoker    Packs/day: 0.15    Types: Cigarettes  . Smokeless tobacco: Never Used  . Tobacco comment: 3 per day  Vaping Use  . Vaping Use: Never used  Substance Use Topics  . Alcohol use: No    Alcohol/week: 0.0 standard drinks  . Drug use: No     Allergies   Patient has no known allergies.   Review of Systems Review of Systems  Constitutional: Positive for chills. Negative for fever.  HENT: Positive for congestion. Negative for ear pain and sore throat.   Eyes: Negative for  pain and visual disturbance.  Respiratory: Positive for cough. Negative for shortness of breath.   Cardiovascular: Negative for chest pain and palpitations.  Gastrointestinal: Positive for constipation. Negative for abdominal pain, diarrhea, nausea and vomiting.  Genitourinary: Negative for dysuria and hematuria.  Musculoskeletal: Negative for arthralgias and back pain.  Skin: Negative for color change and rash.  Neurological: Negative for seizures and syncope.  All other systems reviewed and are negative.    Physical Exam Triage Vital Signs ED Triage Vitals  Enc Vitals Group     BP      Pulse      Resp      Temp      Temp src      SpO2      Weight      Height      Head  Circumference      Peak Flow      Pain Score      Pain Loc      Pain Edu?      Excl. in GC?    No data found.  Updated Vital Signs BP (!) 161/83 (BP Location: Right Arm)   Pulse 97   Temp 99.3 F (37.4 C) (Oral)   Resp 16   Ht 5' 7.5" (1.715 m)   Wt 175 lb (79.4 kg)   SpO2 100%   BMI 27.00 kg/m   Visual Acuity Right Eye Distance:   Left Eye Distance:   Bilateral Distance:    Right Eye Near:   Left Eye Near:    Bilateral Near:     Physical Exam Vitals and nursing note reviewed.  Constitutional:      General: He is not in acute distress.    Appearance: He is well-developed and well-nourished. He is not ill-appearing.  HENT:     Head: Normocephalic and atraumatic.     Right Ear: Tympanic membrane normal.     Left Ear: Tympanic membrane normal.     Nose: Nose normal.     Mouth/Throat:     Mouth: Mucous membranes are moist.     Pharynx: Oropharynx is clear.  Eyes:     Conjunctiva/sclera: Conjunctivae normal.  Cardiovascular:     Rate and Rhythm: Normal rate and regular rhythm.     Heart sounds: Normal heart sounds.  Pulmonary:     Effort: Pulmonary effort is normal. No respiratory distress.     Breath sounds: Normal breath sounds.  Abdominal:     General: Bowel sounds are normal. There is no distension.     Palpations: Abdomen is soft.     Tenderness: There is no abdominal tenderness. There is no guarding or rebound.  Musculoskeletal:        General: No edema.     Cervical back: Neck supple.  Skin:    General: Skin is warm and dry.     Findings: No rash.  Neurological:     General: No focal deficit present.     Mental Status: He is alert and oriented to person, place, and time.     Gait: Gait normal.  Psychiatric:        Mood and Affect: Mood and affect and mood normal.        Behavior: Behavior normal.      UC Treatments / Results  Labs (all labs ordered are listed, but only abnormal results are displayed) Labs Reviewed  RESP PANEL BY RT-PCR  (FLU A&B, COVID) ARPGX2    EKG   Radiology No  results found.  Procedures Procedures (including critical care time)  Medications Ordered in UC Medications - No data to display  Initial Impression / Assessment and Plan / UC Course  I have reviewed the triage vital signs and the nursing notes.  Pertinent labs & imaging results that were available during my care of the patient were reviewed by me and considered in my medical decision making (see chart for details).   Viral URI, constipation. Influenza and COVID pending.  Instructed patient to self quarantine until the test results are back.  Discussed symptomatic treatment including Tylenol, rest, hydration.  Education provided on URI and constipation.  Instructed patient to follow up with PCP if his symptoms are not improving  Patient agrees to plan of care.    Final Clinical Impressions(s) / UC Diagnoses   Final diagnoses:  Viral upper respiratory tract infection  Constipation, unspecified constipation type     Discharge Instructions     Your COVID and Flu tests are pending.  You should self quarantine until the test results are back.    Take Tylenol or ibuprofen as needed for fever or discomfort.  Rest and keep yourself hydrated.    See the attached handouts on viral upper respiratory infections and constipation.  Follow-up with your primary care provider if your symptoms are not improving.         ED Prescriptions    None     PDMP not reviewed this encounter.   Mickie Bail, NP 03/01/20 [phone]

## 2020-03-01 NOTE — ED Triage Notes (Signed)
Patient c/o non-productive cough, nasal congestion, and chills since last night.   Patient has taken Sudafed and Nyquil w/ no relief of symptoms.   Patient is unaware of temperature at home.   Patient c/o constipation since yesterday.   Patient states his last BM was Wednesday.   Patient denies ABD pain.

## 2020-03-01 NOTE — Discharge Instructions (Signed)
Your COVID and Flu tests are pending.  You should self quarantine until the test results are back.    Take Tylenol or ibuprofen as needed for fever or discomfort.  Rest and keep yourself hydrated.    See the attached handouts on viral upper respiratory infections and constipation.  Follow-up with your primary care provider if your symptoms are not improving.

## 2020-03-05 ENCOUNTER — Ambulatory Visit (INDEPENDENT_AMBULATORY_CARE_PROVIDER_SITE_OTHER): Payer: Medicare Other | Admitting: Infectious Diseases

## 2020-03-05 ENCOUNTER — Encounter: Payer: Self-pay | Admitting: Infectious Diseases

## 2020-03-05 ENCOUNTER — Other Ambulatory Visit: Payer: Self-pay

## 2020-03-05 VITALS — Temp 98.4°F | Ht 67.0 in | Wt 177.0 lb

## 2020-03-05 DIAGNOSIS — F4321 Adjustment disorder with depressed mood: Secondary | ICD-10-CM

## 2020-03-05 DIAGNOSIS — F419 Anxiety disorder, unspecified: Secondary | ICD-10-CM

## 2020-03-05 DIAGNOSIS — Z23 Encounter for immunization: Secondary | ICD-10-CM | POA: Diagnosis not present

## 2020-03-05 DIAGNOSIS — N400 Enlarged prostate without lower urinary tract symptoms: Secondary | ICD-10-CM

## 2020-03-05 DIAGNOSIS — Z21 Asymptomatic human immunodeficiency virus [HIV] infection status: Secondary | ICD-10-CM | POA: Diagnosis not present

## 2020-03-05 DIAGNOSIS — B20 Human immunodeficiency virus [HIV] disease: Secondary | ICD-10-CM

## 2020-03-05 DIAGNOSIS — F5104 Psychophysiologic insomnia: Secondary | ICD-10-CM

## 2020-03-05 DIAGNOSIS — I10 Essential (primary) hypertension: Secondary | ICD-10-CM

## 2020-03-05 DIAGNOSIS — Z634 Disappearance and death of family member: Secondary | ICD-10-CM

## 2020-03-05 MED ORDER — AMLODIPINE BESYLATE 5 MG PO TABS: 5.0000 mg | ORAL_TABLET | Freq: Every day | ORAL | 11 refills | Status: DC

## 2020-03-05 NOTE — Progress Notes (Addendum)
Patient ID: Jamie Clark, male   DOB: April 23, 1950, 69 y.o.   MRN: [id number]  CC: HIV follow up care Anxiety / depression Sleep Blood pressure     HPI  Wants some help getting hooked up with [facility]. Has tried to call a few times and had trouble. He states he "shut out" for about a month. He is barely sleeping 1-2 hours a night now.   He has continued his Triumeq once a day without any missed doses. No side effects or concerns with regards to HIV managmenet.   He has not taken his blood pressure pills today. He tries to get them in most evenings during the week. States he takes Lisinopril and HCTZ. Getting up frequently to urinate at night. Can hold void 4 hours during the day. Has some occasional need to "push" when he tries to pee but this is not often. He self discontinued the flomax at some point in the past.     Review of Systems  Constitutional: Positive for fatigue. Negative for chills and fever.  HENT: Negative for dental problem and sore throat.   Respiratory: Negative for cough and shortness of breath.   Cardiovascular: Negative.  Negative for chest pain and leg swelling.  Gastrointestinal: Negative for abdominal pain, diarrhea and vomiting.  Genitourinary: Negative for dysuria and flank pain.  Musculoskeletal: Negative for myalgias and neck pain.  Skin: Negative for rash.  Neurological: Negative for dizziness and headaches.  Psychiatric/Behavioral: Positive for sleep disturbance. Negative for decreased concentration and dysphoric mood. The patient is not nervous/anxious.       Outpatient Encounter Medications as of 03/05/2020  Medication Sig  . acetaminophen (TYLENOL) 500 MG tablet Take 2 tablets (1,000 mg total) by mouth every 8 (eight) hours as needed.  . Fluconazole POWD Apply twice a day between all toes.  [name] ibuprofen (ADVIL) 600 MG tablet Take 1 tablet (600 mg total) by mouth every 6 (six) hours as needed.  [name]  lisinopril-hydrochlorothiazide (ZESTORETIC) 20-25 MG tablet Take 1 tablet by mouth daily.  . tamsulosin (FLOMAX) 0.4 MG CAPS capsule Take 1 capsule (0.4 mg total) by mouth daily.  . TRIUMEQ 600-50-300 MG tablet TAKE ONE Tablet BY MOUTH EVERY DAY  . amLODipine (NORVASC) 5 MG tablet Take 1 tablet (5 mg total) by mouth daily.  . [DISCONTINUED] amLODipine (NORVASC) 5 MG tablet Take 1 tablet (5 mg total) by mouth daily.  . [DISCONTINUED] methylPREDNISolone (MEDROL DOSEPAK) 4 MG TBPK tablet tad (Patient not taking: Reported on 03/05/2020)   No facility-administered encounter medications on file as of 03/05/2020.     Patient Active Problem List   Diagnosis Date Noted  . Insomnia 04/24/2020  . Grief at loss of child 07/04/2019  . Anxiety 12/22/2018  . Prostatic hypertrophy 12/22/2018  . Prediabetes 08/22/2018  . Screen for STD (sexually transmitted disease) 08/22/2018  . Dyslipidemia 01/17/2009  . HTN (hypertension) 01/17/2009  . HIV infection (HCC) 12/18/2008  . Hepatitis C virus infection cured after antiviral drug therapy 07/13/2007     Health Maintenance Due  Topic Date Due  . COVID-19 Vaccine (2 - Booster for Janssen series) 08/14/2019     Physical Exam  Temp 98.4 F (36.9 C) (Oral)   Ht 5\' 7"  (1.702 m)   Wt 177 lb (80.3 kg)   SpO2 98%   BMI 27.72 kg/m   BP Readings from Last 3 Encounters:  04/04/20 (!) 168/98  03/01/20 (!) 161/83  02/06/20 (!) 160/98     Physical  Exam Vitals reviewed.  Constitutional:      Appearance: Normal appearance. He is not ill-appearing.  HENT:     Head: Normocephalic.     Mouth/Throat:     Mouth: Mucous membranes are moist.     Pharynx: Oropharynx is clear.  Eyes:     General: No scleral icterus. Cardiovascular:     Rate and Rhythm: Normal rate and regular rhythm.  Pulmonary:     Effort: Pulmonary effort is normal.     Breath sounds: Normal breath sounds.  Abdominal:     General: There is no distension.     Palpations: Abdomen is  soft.  Musculoskeletal:        General: Normal range of motion.     Cervical back: Normal range of motion.  Skin:    Coloration: Skin is not jaundiced or pale.  Neurological:     Mental Status: He is alert and oriented to person, place, and time.  Psychiatric:        Mood and Affect: Mood normal.        Behavior: Behavior normal.        Judgment: Judgment normal.      Lab Results  Component Value Date   CD4TCELL 46 03/05/2020   Lab Results  Component Value Date   CD4TABS 905 03/05/2020   CD4TABS 949 08/30/2019   CD4TABS 849 03/01/2019   Lab Results  Component Value Date   HIV1RNAQUANT <20 03/05/2020   Lab Results  Component Value Date   HEPBSAB NEG 05/06/2010   Lab Results  Component Value Date   LABRPR NON-REACTIVE 08/30/2019    CBC Lab Results  Component Value Date   WBC 4.1 12/14/2019   RBC 4.01 (L) 12/14/2019   HGB 14.2 12/14/2019   HCT 39.8 12/14/2019   PLT 264 12/14/2019   MCV 99.3 12/14/2019   MCH 35.4 (H) 12/14/2019   MCHC 35.7 12/14/2019   RDW 14.3 12/14/2019   LYMPHSABS 1,812 12/14/2019   MONOABS 0.3 09/14/2017   EOSABS 139 12/14/2019    BMET Lab Results  Component Value Date   NA 138 12/14/2019   K 3.8 12/14/2019   CL 105 12/14/2019   CO2 25 12/14/2019   GLUCOSE 102 (H) 12/14/2019   BUN 14 12/14/2019   CREATININE 1.23 12/14/2019   CALCIUM 9.3 12/14/2019   GFRNONAA 60 12/14/2019   GFRAA 70 12/14/2019    Assessment and Plan Problem List Items Addressed This Visit      Unprioritized   Prostatic hypertrophy    Reviewed plan to call Urology, provided with information. He is apparently due to have an ultrasound and possibly a biopsy with them. He needs further support and explanation for necessary procedures.       Insomnia    In the setting of ongoing grief from loss of child. Impacting his work and life. Discussed medication treatment plan however he is not ready to consider this yet.       HTN (hypertension)    Start  amlodipine 5 mg QD.       Relevant Medications   amLODipine (NORVASC) 5 MG tablet   HIV infection (HCC) (Chronic)    Update pertinent labs. Doing well on Triumeq and no indication to change therapy.  Flu vaccine today. He will consider booster with mRNA for COVID      Grief at loss of child    We called the [facility] line today and helped him get set up for services.  Anxiety    Would like to restart lexapro or alternative, however he is not to a point where he will yet consider.        Other Visit Diagnoses    Asymptomatic HIV infection (HCC)    -  Primary   Relevant Orders   HIV-1 RNA quant-no reflex-bld (Completed)   T-helper cell (CD4)- ([facility] clinic only) (Completed)   Need for immunization against influenza       Relevant Orders   Flu Vaccine QUAD High Dose(Fluad) (Completed)       Rexene Alberts, MSN, NP-C [facility]  [email or web address] Pager: [phone] Office: [phone] [facility] Main Line: [phone]

## 2020-03-05 NOTE — Patient Instructions (Addendum)
Please conitue your Triumeq once a day.   Please stop by the lab on your way out.   Call [facility] - tell them you have an ultrasound scheduled for January 6th. Do you need to do this or just schedule the biopsy with the urologist? Also ask them about urine problems and other problems.   A biopsy is taking a small piece of tissue to look under the microscope to see if that helps   For the vitamin C - try to find a supplement that is less than 1 gram (1000 mg)  New blood pressure medicine AMLODIPINE one pill once a day.   Please come back in 3 months

## 2020-03-06 ENCOUNTER — Ambulatory Visit: Payer: Medicare Other

## 2020-03-06 LAB — T-HELPER CELL (CD4) - (RCID CLINIC ONLY)
CD4 % Helper T Cell: 46 % (ref 33–65)
CD4 T Cell Abs: 905 /uL (ref 400–1790)

## 2020-03-06 NOTE — Progress Notes (Deleted)
Subjective:   Jamie Clark is a 69 y.o. male who presents for an Initial Medicare Annual Wellness Visit.  Review of Systems    N/A        Objective:    There were no vitals filed for this visit. There is no height or weight on file to calculate BMI.  Advanced Directives 07/03/2019 02/23/2019 12/22/2018 12/07/2018 08/29/2018 09/13/2017 04/21/2017  Does Patient Have a Medical Advance Directive? No No No No No No No  Type of Advance Directive - - - - - - -  Does patient want to make changes to medical advance directive? - - - - - No - Patient declined -  Copy of Healthcare Power of Attorney in Chart? - - - - - - -  Would patient like information on creating a medical advance directive? Yes (MAU/Ambulatory/Procedural Areas - Information given) No - Patient declined Yes (MAU/Ambulatory/Procedural Areas - Information given) - No - Patient declined No - Patient declined -    Current Medications (verified) Outpatient Encounter Medications as of 03/06/2020  Medication Sig  . acetaminophen (TYLENOL) 500 MG tablet Take 2 tablets (1,000 mg total) by mouth every 8 (eight) hours as needed.  [name] amLODipine (NORVASC) 5 MG tablet Take 1 tablet (5 mg total) by mouth daily.  . Fluconazole POWD Apply twice a day between all toes.  [name] ibuprofen (ADVIL) 600 MG tablet Take 1 tablet (600 mg total) by mouth every 6 (six) hours as needed.  [name] lisinopril-hydrochlorothiazide (ZESTORETIC) 20-25 MG tablet Take 1 tablet by mouth daily.  . methylPREDNISolone (MEDROL DOSEPAK) 4 MG TBPK tablet tad (Patient not taking: Reported on 03/05/2020)  . tamsulosin (FLOMAX) 0.4 MG CAPS capsule Take 1 capsule (0.4 mg total) by mouth daily.  . TRIUMEQ 600-50-300 MG tablet TAKE ONE Tablet BY MOUTH EVERY DAY   No facility-administered encounter medications on file as of 03/06/2020.    Allergies (verified) Patient has no known allergies.   History: Past Medical History:  Diagnosis Date  . Allergy   . Anxiety   . Depression    . Hepatitis C, chronic (HCC)   . HIV positive (HCC)   . Hypertension    Past Surgical History:  Procedure Laterality Date  . NO PAST SURGERIES     Family History  Problem Relation Age of Onset  . Cancer Mother   . Hypertension Mother   . Esophageal cancer Mother   . Cancer Father   . Hypertension Father   . Colon cancer Neg Hx   . Colon polyps Neg Hx   . Rectal cancer Neg Hx   . Stomach cancer Neg Hx    Social History   Socioeconomic History  . Marital status: Single    Spouse name: Not on file  . Number of children: Not on file  . Years of education: Not on file  . Highest education level: Not on file  Occupational History  . Not on file  Tobacco Use  . Smoking status: Light Tobacco Smoker    Packs/day: 0.15    Types: Cigarettes  . Smokeless tobacco: Never Used  . Tobacco comment: 3 per day  Vaping Use  . Vaping Use: Never used  Substance and Sexual Activity  . Alcohol use: No    Alcohol/week: 0.0 standard drinks  . Drug use: No  . Sexual activity: Never    Partners: Female    Birth control/protection: Condom    Comment: given condms  Other Topics Concern  . Not on file  Social History Narrative  . Not on file   Social Determinants of Health   Financial Resource Strain: Not on file  Food Insecurity: Not on file  Transportation Needs: Not on file  Physical Activity: Not on file  Stress: Not on file  Social Connections: Not on file    Tobacco Counseling Ready to quit: Not Answered Counseling given: Not Answered Comment: 3 per day   Clinical Intake:                 Diabetic?No          Activities of Daily Living In your present state of health, do you have any difficulty performing the following activities: 07/03/2019  Hearing? N  Vision? N  Difficulty concentrating or making decisions? Y  Walking or climbing stairs? N  Dressing or bathing? N  Doing errands, shopping? N  Some recent data might be hidden    Patient Care  Team: Shirline Frees, NP as PCP - General (Family Medicine)  Indicate any recent Medical Services you may have received from other than [facility] providers in the past year (date may be approximate).     Assessment:   This is a routine wellness examination for Jamie Clark.  Hearing/Vision screen No exam data present  Dietary issues and exercise activities discussed:    Goals   None    Depression Screen PHQ 2/9 Scores 03/05/2020 09/29/2019 08/30/2019 07/05/2019 07/03/2019 03/01/2019 12/22/2018  PHQ - 2 Score 0 0 0 2 4 0 0  PHQ- 9 Score - - - 8 17 - -    Fall Risk Fall Risk  03/05/2020 09/29/2019 08/30/2019 07/05/2019 07/03/2019  Falls in the past year? 0 0 0 0 0  Number falls in past yr: - - 0 - -  Injury with Fall? - - 0 - -  Risk for fall due to : - No Fall Risks - - No Fall Risks  Follow up Falls evaluation completed Falls evaluation completed - Falls evaluation completed -    FALL RISK PREVENTION PERTAINING TO THE HOME:  Any stairs in or around the home? {YES/NO:21197} If so, are there any without handrails? No  Home free of loose throw rugs in walkways, pet beds, electrical cords, etc? Yes  Adequate lighting in your home to reduce risk of falls? Yes   ASSISTIVE DEVICES UTILIZED TO PREVENT FALLS:  Life alert? {YES/NO:21197} Use of a cane, walker or w/c? {YES/NO:21197} Grab bars in the bathroom? {YES/NO:21197} Shower chair or bench in shower? {YES/NO:21197} Elevated toilet seat or a handicapped toilet? {YES/NO:21197}   Cognitive Function:        Immunizations Immunization History  Administered Date(s) Administered  . Fluad Quad(high Dose 65+) 03/05/2020  . Hepatitis B 01/17/2009, 05/02/2009, 08/21/2009  . Influenza Split 03/08/2012  . Influenza Whole 12/24/2009, 11/18/2010  . Influenza, Seasonal, Injecte, Preservative Fre 02/09/2013  . Influenza,inj,Quad PF,6+ Mos 11/22/2013, 01/15/2015, 12/12/2015, 12/24/2016, 02/01/2018, 03/01/2019  . Janssen (J&J) SARS-COV-2 Vaccination  06/19/2019  . PPD Test 06/16/2011  . Pneumococcal Conjugate-13 09/20/2017  . Pneumococcal Polysaccharide-23 12/24/2009, 03/10/2017  . Tdap 12/14/2019    TDAP status: Up to date  Flu Vaccine status: Up to date  Pneumococcal vaccine status: Up to date  Covid-19 vaccine status: Completed vaccines  Qualifies for Shingles Vaccine? Yes   Zostavax completed No   Shingrix Completed?: No.    Education has been provided regarding the importance of this vaccine. Patient has been advised to call insurance company to determine out of pocket expense if they  have not yet received this vaccine. Advised may also receive vaccine at local pharmacy or Health Dept. Verbalized acceptance and understanding.  Screening Tests Health Maintenance  Topic Date Due  . COVID-19 Vaccine (2 - Booster for [employer] series) 08/14/2019  . TETANUS/TDAP  12/13/2029  . INFLUENZA VACCINE  Completed  . Hepatitis C Screening  Completed  . PNA vac Low Risk Adult  Completed    Health Maintenance  Health Maintenance Due  Topic Date Due  . COVID-19 Vaccine (2 - Booster for Janssen series) 08/14/2019    Colorectal cancer screening: No longer required.   Lung Cancer Screening: (Low Dose CT Chest recommended if Age 67-80 years, 30 pack-year currently smoking OR have quit w/in 15years.) does not qualify.   Lung Cancer Screening Referral: N/A   Additional Screening:  Hepatitis C Screening: does qualify; Completed 04/25/2018  Vision Screening: Recommended annual ophthalmology exams for early detection of glaucoma and other disorders of the eye. Is the patient up to date with their annual eye exam?  {YES/NO:21197} Who is the provider or what is the name of the office in which the patient attends annual eye exams? *** If pt is not established with a provider, would they like to be referred to a provider to establish care? {YES/NO:21197}.   Dental Screening: Recommended annual dental exams for proper oral  hygiene  Community Resource Referral / Chronic Care Management: CRR required this visit?  No   CCM required this visit?  No      Plan:     I have personally reviewed and noted the following in the patient's chart:   . Medical and social history . Use of alcohol, tobacco or illicit drugs  . Current medications and supplements . Functional ability and status . Nutritional status . Physical activity . Advanced directives . List of other physicians . Hospitalizations, surgeries, and ER visits in previous 12 months . Vitals . Screenings to include cognitive, depression, and falls . Referrals and appointments  In addition, I have reviewed and discussed with patient certain preventive protocols, quality metrics, and best practice recommendations. A written personalized care plan for preventive services as well as general preventive health recommendations were provided to patient.     Theodora Blow, LPN   [id number]   Nurse Notes: None

## 2020-03-07 LAB — HIV-1 RNA QUANT-NO REFLEX-BLD
HIV 1 RNA Quant: <20 Copies/mL
HIV-1 RNA Quant, Log: <1.3 Log cps/mL

## 2020-03-21 ENCOUNTER — Other Ambulatory Visit: Payer: Self-pay | Admitting: *Deleted

## 2020-03-21 ENCOUNTER — Encounter: Payer: Self-pay | Admitting: Infectious Diseases

## 2020-03-21 MED ORDER — HYDROXYZINE HCL 25 MG PO TABS: 25.0000 mg | ORAL_TABLET | Freq: Three times a day (TID) | ORAL | 0 refills | Status: DC | PRN

## 2020-03-21 MED ORDER — ESCITALOPRAM OXALATE 10 MG PO TABS: 10.0000 mg | ORAL_TABLET | Freq: Every day | ORAL | 2 refills | Status: DC

## 2020-03-21 MED ORDER — TRAZODONE HCL 50 MG PO TABS
50.0000 mg | ORAL_TABLET | Freq: Every day | ORAL | 2 refills | Status: DC
Start: 2020-03-21 — End: 2020-06-05

## 2020-03-21 NOTE — Telephone Encounter (Addendum)
Patient walked in, asked to speak with [name] or her nurse urgently. He has been feeling overwhelmed and with increased depression. He cannot sleep at night, has been reliving finding his son's body when he closes his eyes. He is having difficulty at work with his coworkers - they argue with each other, get high at work, and do not do their jobs. He has been sober for 14 years, feels this is disrespectful and a hard situation to deal with when he isn't at his best. He also now has 2 granddaughters now fighting covid. He states that he thought he was handling things well when he last saw [name], but feels things are getting out of control now. He would like to restart what [name] advised earlier. While in clinic, he made an appointment with [facility] for [facility] in mid-January. Sent in prescriptions for trazodone, lexapro, and hydroxyzine per verbal order from [facility]. Explained how to take these and to give them time to work. Reviewed drowsiness as potential side effect for hydroxyzine and advised against driving while taking it. [name] asked for a note for work. [name] ideally would like him to take 2-4 weeks off, would settle for 1-2 to let the lexapro start to work in his system. Philo said he couldn't do that, but his manager is very understanding of his situation and works with him.  He would like to return to work tomorrow. Stephanie sent letter via MyChart, patient aware. He can come back to [facility] when [name] is in clinic if he needs a signed copy.  He will follow up with Cassie to see how his medications are going. Andree Coss, RN

## 2020-03-26 ENCOUNTER — Ambulatory Visit (INDEPENDENT_AMBULATORY_CARE_PROVIDER_SITE_OTHER): Payer: Medicare Other

## 2020-03-26 ENCOUNTER — Other Ambulatory Visit: Payer: Self-pay

## 2020-03-26 DIAGNOSIS — Z0000 Encounter for general adult medical examination without abnormal findings: Secondary | ICD-10-CM

## 2020-03-26 NOTE — Patient Instructions (Signed)
Mr. Jamie Clark , Thank you for taking time to come for your Medicare Wellness Visit. I appreciate your ongoing commitment to your health goals. Please review the following plan we discussed and let me know if I can assist you in the future.   Screening recommendations/referrals: Colonoscopy: No longer required per report of last colonoscopy  Recommended yearly ophthalmology/optometry visit for glaucoma screening and checkup Recommended yearly dental visit for hygiene and checkup  Vaccinations: Influenza vaccine: Up to date, next due fall 2022  Pneumococcal vaccine: Completed series  Tdap vaccine: Up to date next due 12/13/2029 Shingles vaccine: Currently due, if you wish to receive you may do so at your local pharmacy     Advanced directives: Please bring in copies of your advanced medical directives so that we may scan them into your chart.  Conditions/risks identified: None   Next appointment: 03/27/2021 @ 3:30 PM for Medicare wellness visit   Preventive Care 65 Years and Older, Male Preventive care refers to lifestyle choices and visits with your health care provider that can promote health and wellness. What does preventive care include?  A yearly physical exam. This is also called an annual well check.  Dental exams once or twice a year.  Routine eye exams. Ask your health care provider how often you should have your eyes checked.  Personal lifestyle choices, including:  Daily care of your teeth and gums.  Regular physical activity.  Eating a healthy diet.  Avoiding tobacco and drug use.  Limiting alcohol use.  Practicing safe sex.  Taking low doses of aspirin every day.  Taking vitamin and mineral supplements as recommended by your health care provider. What happens during an annual well check? The services and screenings done by your health care provider during your annual well check will depend on your age, overall health, lifestyle risk factors, and family  history of disease. Counseling  Your health care provider may ask you questions about your:  Alcohol use.  Tobacco use.  Drug use.  Emotional well-being.  Home and relationship well-being.  Sexual activity.  Eating habits.  History of falls.  Memory and ability to understand (cognition).  Work and work Astronomer. Screening  You may have the following tests or measurements:  Height, weight, and BMI.  Blood pressure.  Lipid and cholesterol levels. These may be checked every 5 years, or more frequently if you are over 22 years old.  Skin check.  Lung cancer screening. You may have this screening every year starting at age 15 if you have a 30-pack-year history of smoking and currently smoke or have quit within the past 15 years.  Fecal occult blood test (FOBT) of the stool. You may have this test every year starting at age 49.  Flexible sigmoidoscopy or colonoscopy. You may have a sigmoidoscopy every 5 years or a colonoscopy every 10 years starting at age 16.  Prostate cancer screening. Recommendations will vary depending on your family history and other risks.  Hepatitis C blood test.  Hepatitis B blood test.  Sexually transmitted disease (STD) testing.  Diabetes screening. This is done by checking your blood sugar (glucose) after you have not eaten for a while (fasting). You may have this done every 1-3 years.  Abdominal aortic aneurysm (AAA) screening. You may need this if you are a current or former smoker.  Osteoporosis. You may be screened starting at age 26 if you are at high risk. Talk with your health care provider about your test results, treatment options, and  if necessary, the need for more tests. Vaccines  Your health care provider may recommend certain vaccines, such as:  Influenza vaccine. This is recommended every year.  Tetanus, diphtheria, and acellular pertussis (Tdap, Td) vaccine. You may need a Td booster every 10 years.  Zoster vaccine.  You may need this after age 1.  Pneumococcal 13-valent conjugate (PCV13) vaccine. One dose is recommended after age 8.  Pneumococcal polysaccharide (PPSV23) vaccine. One dose is recommended after age 58. Talk to your health care provider about which screenings and vaccines you need and how often you need them. This information is not intended to replace advice given to you by your health care provider. Make sure you discuss any questions you have with your health care provider. Document Released: 04/05/2015 Document Revised: 11/27/2015 Document Reviewed: 01/08/2015 Elsevier Interactive Patient Education  2017 [employer] Prevention in the Home Falls can cause injuries. They can happen to people of all ages. There are many things you can do to make your home safe and to help prevent falls. What can I do on the outside of my home?  Regularly fix the edges of walkways and driveways and fix any cracks.  Remove anything that might make you trip as you walk through a door, such as a raised step or threshold.  Trim any bushes or trees on the path to your home.  Use bright outdoor lighting.  Clear any walking paths of anything that might make someone trip, such as rocks or tools.  Regularly check to see if handrails are loose or broken. Make sure that both sides of any steps have handrails.  Any raised decks and porches should have guardrails on the edges.  Have any leaves, snow, or ice cleared regularly.  Use sand or salt on walking paths during winter.  Clean up any spills in your garage right away. This includes oil or grease spills. What can I do in the bathroom?  Use night lights.  Install grab bars by the toilet and in the tub and shower. Do not use towel bars as grab bars.  Use non-skid mats or decals in the tub or shower.  If you need to sit down in the shower, use a plastic, non-slip stool.  Keep the floor dry. Clean up any water that spills on the floor as soon  as it happens.  Remove soap buildup in the tub or shower regularly.  Attach bath mats securely with double-sided non-slip rug tape.  Do not have throw rugs and other things on the floor that can make you trip. What can I do in the bedroom?  Use night lights.  Make sure that you have a light by your bed that is easy to reach.  Do not use any sheets or blankets that are too big for your bed. They should not hang down onto the floor.  Have a firm chair that has side arms. You can use this for support while you get dressed.  Do not have throw rugs and other things on the floor that can make you trip. What can I do in the kitchen?  Clean up any spills right away.  Avoid walking on wet floors.  Keep items that you use a lot in easy-to-reach places.  If you need to reach something above you, use a strong step stool that has a grab bar.  Keep electrical cords out of the way.  Do not use floor polish or wax that makes floors slippery. If you  must use wax, use non-skid floor wax.  Do not have throw rugs and other things on the floor that can make you trip. What can I do with my stairs?  Do not leave any items on the stairs.  Make sure that there are handrails on both sides of the stairs and use them. Fix handrails that are broken or loose. Make sure that handrails are as long as the stairways.  Check any carpeting to make sure that it is firmly attached to the stairs. Fix any carpet that is loose or worn.  Avoid having throw rugs at the top or bottom of the stairs. If you do have throw rugs, attach them to the floor with carpet tape.  Make sure that you have a light switch at the top of the stairs and the bottom of the stairs. If you do not have them, ask someone to add them for you. What else can I do to help prevent falls?  Wear shoes that:  Do not have high heels.  Have rubber bottoms.  Are comfortable and fit you well.  Are closed at the toe. Do not wear sandals.  If  you use a stepladder:  Make sure that it is fully opened. Do not climb a closed stepladder.  Make sure that both sides of the stepladder are locked into place.  Ask someone to hold it for you, if possible.  Clearly mark and make sure that you can see:  Any grab bars or handrails.  First and last steps.  Where the edge of each step is.  Use tools that help you move around (mobility aids) if they are needed. These include:  Canes.  Walkers.  Scooters.  Crutches.  Turn on the lights when you go into a dark area. Replace any light bulbs as soon as they burn out.  Set up your furniture so you have a clear path. Avoid moving your furniture around.  If any of your floors are uneven, fix them.  If there are any pets around you, be aware of where they are.  Review your medicines with your doctor. Some medicines can make you feel dizzy. This can increase your chance of falling. Ask your doctor what other things that you can do to help prevent falls. This information is not intended to replace advice given to you by your health care provider. Make sure you discuss any questions you have with your health care provider. Document Released: 01/03/2009 Document Revised: 08/15/2015 Document Reviewed: 04/13/2014 Elsevier Interactive Patient Education  2017 [employer].

## 2020-03-26 NOTE — Progress Notes (Addendum)
Subjective:   Jamie Clark is a 70 y.o. male who presents for an Initial Medicare Annual Wellness Visit.  I connected with Tedrick Port today by telephone and verified that I am speaking with the correct person using two identifiers. Location patient: home Location provider: work Persons participating in the virtual visit: patient, provider.   I discussed the limitations, risks, security and privacy concerns of performing an evaluation and management service by telephone and the availability of in person appointments. I also discussed with the patient that there may be a patient responsible charge related to this service. The patient expressed understanding and verbally consented to this telephonic visit.    Interactive audio and video telecommunications were attempted between this provider and patient, however failed, due to patient having technical difficulties OR patient did not have access to video capability.  We continued and completed visit with audio only.      Review of Systems    N/A  Cardiac Risk Factors include: advanced age (>74men, >92 women);hypertension;male gender     Objective:    Today's Vitals   03/26/20 1536  PainSc: 9    There is no height or weight on file to calculate BMI.  Advanced Directives 03/26/2020 07/03/2019 02/23/2019 12/22/2018 12/07/2018 08/29/2018 09/13/2017  Does Patient Have a Medical Advance Directive? Yes No No No No No No  Type of Estate agent of [place];Living will - - - - - -  Does patient want to make changes to medical advance directive? No - Patient declined - - - - - No - Patient declined  Copy of Healthcare Power of Attorney in Chart? No - copy requested - - - - - -  Would patient like information on creating a medical advance directive? - Yes (MAU/Ambulatory/Procedural Areas - Information given) No - Patient declined Yes (MAU/Ambulatory/Procedural Areas - Information given) - No - Patient declined No -  Patient declined    Current Medications (verified) Outpatient Encounter Medications as of 03/26/2020  Medication Sig  . acetaminophen (TYLENOL) 500 MG tablet Take 2 tablets (1,000 mg total) by mouth every 8 (eight) hours as needed.  [name] amLODipine (NORVASC) 5 MG tablet Take 1 tablet (5 mg total) by mouth daily.  [name] escitalopram (LEXAPRO) 10 MG tablet Take 1 tablet (10 mg total) by mouth daily.  . Fluconazole POWD Apply twice a day between all toes.  . hydrOXYzine (ATARAX/VISTARIL) 25 MG tablet Take 1 tablet (25 mg total) by mouth 3 (three) times daily as needed for anxiety.  [name] ibuprofen (ADVIL) 600 MG tablet Take 1 tablet (600 mg total) by mouth every 6 (six) hours as needed.  [name] lisinopril-hydrochlorothiazide (ZESTORETIC) 20-25 MG tablet Take 1 tablet by mouth daily.  . tamsulosin (FLOMAX) 0.4 MG CAPS capsule Take 1 capsule (0.4 mg total) by mouth daily.  . traZODone (DESYREL) 50 MG tablet Take 1-2 tablets (50-100 mg total) by mouth at bedtime.  . TRIUMEQ 600-50-300 MG tablet TAKE ONE Tablet BY MOUTH EVERY DAY  . [DISCONTINUED] methylPREDNISolone (MEDROL DOSEPAK) 4 MG TBPK tablet tad (Patient not taking: Reported on 03/05/2020)   No facility-administered encounter medications on file as of 03/26/2020.    Allergies (verified) Patient has no known allergies.   History: Past Medical History:  Diagnosis Date  . Allergy   . Anxiety   . Depression   . Hepatitis C, chronic (HCC)   . HIV positive (HCC)   . Hypertension    Past Surgical History:  Procedure Laterality Date  . NO  PAST SURGERIES     Family History  Problem Relation Age of Onset  . Cancer Mother   . Hypertension Mother   . Esophageal cancer Mother   . Cancer Father   . Hypertension Father   . Colon cancer Neg Hx   . Colon polyps Neg Hx   . Rectal cancer Neg Hx   . Stomach cancer Neg Hx    Social History   Socioeconomic History  . Marital status: Single    Spouse name: Not on file  . Number of children: Not on file   . Years of education: Not on file  . Highest education level: Not on file  Occupational History  . Not on file  Tobacco Use  . Smoking status: Light Tobacco Smoker    Packs/day: 0.15    Types: Cigarettes  . Smokeless tobacco: Never Used  . Tobacco comment: 3 per day  Vaping Use  . Vaping Use: Never used  Substance and Sexual Activity  . Alcohol use: No    Alcohol/week: 0.0 standard drinks  . Drug use: No  . Sexual activity: Never    Partners: Female    Birth control/protection: Condom    Comment: given condms  Other Topics Concern  . Not on file  Social History Narrative  . Not on file   Social Determinants of Health   Financial Resource Strain: Low Risk   . Difficulty of Paying Living Expenses: Not hard at all  Food Insecurity: No Food Insecurity  . Worried About Charity fundraiser in the Last Year: Never true  . Ran Out of Food in the Last Year: Never true  Transportation Needs: No Transportation Needs  . Lack of Transportation (Medical): No  . Lack of Transportation (Non-Medical): No  Physical Activity: Inactive  . Days of Exercise per Week: 0 days  . Minutes of Exercise per Session: 0 min  Stress: No Stress Concern Present  . Feeling of Stress : Not at all  Social Connections: Moderately Integrated  . Frequency of Communication with Friends and Family: More than three times a week  . Frequency of Social Gatherings with Friends and Family: More than three times a week  . Attends Religious Services: More than 4 times per year  . Active Member of Clubs or Organizations: Yes  . Attends Archivist Meetings: More than 4 times per year  . Marital Status: Never married    Tobacco Counseling Ready to quit: Not Answered Counseling given: Not Answered Comment: 3 per day   Clinical Intake:  Pre-visit preparation completed: Yes  Pain : 0-10 Pain Score: 9  Pain Type: Acute pain Pain Location:  (toes) Pain Orientation: Right Pain Descriptors /  Indicators: Aching Pain Onset: 1 to 4 weeks ago Pain Frequency: Constant     Nutritional Risks: None Diabetes: No CBG done?: No Did pt. bring in CBG monitor from home?: No  How often do you need to have someone help you when you read instructions, pamphlets, or other written materials from your doctor or pharmacy?: 1 - Never What is the last grade level you completed in school?: 1 year of college  Diabetic?No   Interpreter Needed?: No  Information entered by :: [place] of Daily Living In your present state of health, do you have any difficulty performing the following activities: 03/26/2020 07/03/2019  Hearing? N N  Vision? N N  Difficulty concentrating or making decisions? N Y  Walking or climbing stairs? N N  Dressing or bathing? N N  Doing errands, shopping? N N  Preparing Food and eating ? N -  Using the Toilet? N -  In the past six months, have you accidently leaked urine? N -  Do you have problems with loss of bowel control? N -  Managing your Medications? N -  Managing your Finances? N -  Housekeeping or managing your Housekeeping? N -  Some recent data might be hidden    Patient Care Team: Shirline Frees, NP as PCP - General (Family Medicine)  Indicate any recent Medical Services you may have received from other than [facility] providers in the past year (date may be approximate).     Assessment:   This is a routine wellness examination for Tylee.  Hearing/Vision screen  Hearing Screening   125Hz  250Hz  500Hz  1000Hz  2000Hz  3000Hz  4000Hz  6000Hz  8000Hz   Right ear:           Left ear:           Vision Screening Comments: Patient states gets eyes checked annually. Has bilateral cataracts not ready for surgery yet   Dietary issues and exercise activities discussed: Current Exercise Habits: The patient does not participate in regular exercise at present  Goals    . Exercise 3x per week (30 min per time)    . Patient Stated     I would like to know  go back to school       Depression Screen PHQ 2/9 Scores 03/26/2020 03/05/2020 09/29/2019 08/30/2019 07/05/2019 07/03/2019 03/01/2019  PHQ - 2 Score 1 0 0 0 2 4 0  PHQ- 9 Score 1 - - - 8 17 -    Fall Risk Fall Risk  03/26/2020 03/05/2020 09/29/2019 08/30/2019 07/05/2019  Falls in the past year? 0 0 0 0 0  Number falls in past yr: 0 - - 0 -  Injury with Fall? 0 - - 0 -  Risk for fall due to : No Fall Risks - No Fall Risks - -  Follow up Falls evaluation completed;Falls prevention discussed Falls evaluation completed Falls evaluation completed - Falls evaluation completed    FALL RISK PREVENTION PERTAINING TO THE HOME:  Any stairs in or around the home? No  If so, are there any without handrails? No  Home free of loose throw rugs in walkways, pet beds, electrical cords, etc? Yes  Adequate lighting in your home to reduce risk of falls? Yes   ASSISTIVE DEVICES UTILIZED TO PREVENT FALLS:  Life alert? No  Use of a cane, walker or w/c? No  Grab bars in the bathroom? No  Shower chair or bench in shower? No  Elevated toilet seat or a handicapped toilet? No    Cognitive Function:   Normal cognitive status assessed by direct observation by this Nurse Health Advisor. No abnormalities found.        Immunizations Immunization History  Administered Date(s) Administered  . Fluad Quad(high Dose 65+) 03/05/2020  . Hepatitis B 01/17/2009, 05/02/2009, 08/21/2009  . Influenza Split 03/08/2012  . Influenza Whole 12/24/2009, 11/18/2010  . Influenza, Seasonal, Injecte, Preservative Fre 02/09/2013  . Influenza,inj,Quad PF,6+ Mos 11/22/2013, 01/15/2015, 12/12/2015, 12/24/2016, 02/01/2018, 03/01/2019  . Janssen (J&J) SARS-COV-2 Vaccination 06/19/2019  . PPD Test 06/16/2011  . Pneumococcal Conjugate-13 09/20/2017  . Pneumococcal Polysaccharide-23 12/24/2009, 03/10/2017  . Tdap 12/14/2019    TDAP status: Up to date  Flu Vaccine status: Up to date  Pneumococcal vaccine status: Up to date  Covid-19  vaccine status: Completed vaccines  Qualifies for Shingles Vaccine? Yes   Zostavax completed No   Shingrix Completed?: No.    Education has been provided regarding the importance of this vaccine. Patient has been advised to call insurance company to determine out of pocket expense if they have not yet received this vaccine. Advised may also receive vaccine at local pharmacy or Health Dept. Verbalized acceptance and understanding.  Screening Tests Health Maintenance  Topic Date Due  . COVID-19 Vaccine (2 - Booster for [employer] series) 08/14/2019  . TETANUS/TDAP  12/13/2029  . INFLUENZA VACCINE  Completed  . Hepatitis C Screening  Completed  . PNA vac Low Risk Adult  Completed    Health Maintenance  Health Maintenance Due  Topic Date Due  . COVID-19 Vaccine (2 - Booster for Janssen series) 08/14/2019    Colorectal cancer screening: No longer required.   Lung Cancer Screening: (Low Dose CT Chest recommended if Age 73-80 years, 30 pack-year currently smoking OR have quit w/in 15years.) does not qualify.   Lung Cancer Screening Referral: N/A   Additional Screening:  Hepatitis C Screening: does qualify; Completed 04/25/2018   Vision Screening: Recommended annual ophthalmology exams for early detection of glaucoma and other disorders of the eye. Is the patient up to date with their annual eye exam?  Yes  Who is the provider or what is the name of the office in which the patient attends annual eye exams? [facility] on [address] If pt is not established with a provider, would they like to be referred to a provider to establish care? No .   Dental Screening: Recommended annual dental exams for proper oral hygiene  Community Resource Referral / Chronic Care Management: CRR required this visit?  No   CCM required this visit?  No      Plan:     I have personally reviewed and noted the following in the patient's chart:   . Medical and social history . Use of alcohol,  tobacco or illicit drugs  . Current medications and supplements . Functional ability and status . Nutritional status . Physical activity . Advanced directives . List of other physicians . Hospitalizations, surgeries, and ER visits in previous 12 months . Vitals . Screenings to include cognitive, depression, and falls . Referrals and appointments  In addition, I have reviewed and discussed with patient certain preventive protocols, quality metrics, and best practice recommendations. A written personalized care plan for preventive services as well as general preventive health recommendations were provided to patient.     Theodora Blow, LPN   08/25/7844   Nurse Notes: None

## 2020-03-28 ENCOUNTER — Ambulatory Visit (HOSPITAL_COMMUNITY): Admission: RE | Admit: 2020-03-28 | Payer: Medicare Other | Source: Ambulatory Visit

## 2020-03-28 ENCOUNTER — Encounter (HOSPITAL_COMMUNITY): Payer: Self-pay

## 2020-03-30 DIAGNOSIS — Z1152 Encounter for screening for COVID-19: Secondary | ICD-10-CM | POA: Diagnosis not present

## 2020-04-02 ENCOUNTER — Ambulatory Visit: Payer: Medicare Other | Admitting: Pharmacist

## 2020-04-04 ENCOUNTER — Ambulatory Visit (HOSPITAL_COMMUNITY)
Admission: EM | Admit: 2020-04-04 | Discharge: 2020-04-04 | Disposition: A | Discharge status: Home / Self Care | Payer: Medicare Other | Attending: Family Medicine | Admitting: Family Medicine

## 2020-04-04 ENCOUNTER — Encounter (HOSPITAL_COMMUNITY): Payer: Self-pay

## 2020-04-04 ENCOUNTER — Other Ambulatory Visit: Payer: Self-pay

## 2020-04-04 DIAGNOSIS — H5711 Ocular pain, right eye: Secondary | ICD-10-CM | POA: Diagnosis not present

## 2020-04-04 DIAGNOSIS — H5789 Other specified disorders of eye and adnexa: Secondary | ICD-10-CM | POA: Diagnosis not present

## 2020-04-04 MED ORDER — POLYMYXIN B-TRIMETHOPRIM 10000-0.1 UNIT/ML-% OP SOLN: 1.0000 [drp] | OPHTHALMIC | 0 refills | Status: DC

## 2020-04-04 NOTE — ED Triage Notes (Signed)
Pt in with c/o right eye redness that started when he woke up a few days ago.  States he was using eye drops but stopped because it was giving no relief

## 2020-04-04 NOTE — ED Provider Notes (Signed)
[facility]    CSN: [id number] Arrival date & time: 04/04/20  1106      History   Chief Complaint Chief Complaint  Patient presents with  . Eye Pain    HPI Jamie Clark is a 70 y.o. male.   Here today with 1 week of persistent right eye redness, irritation and drainage that he states feels like when he's had pink eye in the past. Does have photophobia and some blurriness, which he states is worse than baseline but does typically have poor vision in this side. Denies N/V, severe headache, injury to eye, history of chronic eye issues. Trying OTC visine without relief.      Past Medical History:  Diagnosis Date  . Allergy   . Anxiety   . Depression   . Hepatitis C, chronic (HCC)   . HIV positive (HCC)   . Hypertension     Patient Active Problem List   Diagnosis Date Noted  . Grief at loss of child 07/04/2019  . Anxiety 12/22/2018  . Prostatic hypertrophy 12/22/2018  . Prediabetes 08/22/2018  . Screen for STD (sexually transmitted disease) 08/22/2018  . Dyslipidemia 01/17/2009  . HTN (hypertension) 01/17/2009  . HIV infection (HCC) 12/18/2008  . Hepatitis C virus infection cured after antiviral drug therapy 07/13/2007    Past Surgical History:  Procedure Laterality Date  . NO PAST SURGERIES         Home Medications    Prior to Admission medications   Medication Sig Start Date End Date Taking? Authorizing Provider  trimethoprim-polymyxin b (POLYTRIM) ophthalmic solution Place 1 drop into the right eye every 4 (four) hours. 04/04/20  Yes Particia Nearing, PA-C  acetaminophen (TYLENOL) 500 MG tablet Take 2 tablets (1,000 mg total) by mouth every 8 (eight) hours as needed. 11/09/19   Darr, Gerilyn Pilgrim, PA-C  amLODipine (NORVASC) 5 MG tablet Take 1 tablet (5 mg total) by mouth daily. 03/05/20   Blanchard Kelch, NP  escitalopram (LEXAPRO) 10 MG tablet Take 1 tablet (10 mg total) by mouth daily. 03/21/20   Blanchard Kelch, NP  Fluconazole POWD  Apply twice a day between all toes. 11/21/19   Nafziger, Kandee Keen, NP  hydrOXYzine (ATARAX/VISTARIL) 25 MG tablet Take 1 tablet (25 mg total) by mouth 3 (three) times daily as needed for anxiety. 03/21/20   Blanchard Kelch, NP  ibuprofen (ADVIL) 600 MG tablet Take 1 tablet (600 mg total) by mouth every 6 (six) hours as needed. 02/02/20   Eustace Moore, MD  lisinopril-hydrochlorothiazide (ZESTORETIC) 20-25 MG tablet Take 1 tablet by mouth daily. 12/14/19   Nafziger, Kandee Keen, NP  tamsulosin (FLOMAX) 0.4 MG CAPS capsule Take 1 capsule (0.4 mg total) by mouth daily. 12/14/19   Nafziger, Kandee Keen, NP  traZODone (DESYREL) 50 MG tablet Take 1-2 tablets (50-100 mg total) by mouth at bedtime. 03/21/20   Blanchard Kelch, NP  TRIUMEQ 600-50-300 MG tablet TAKE ONE Tablet BY MOUTH EVERY DAY 10/12/19   Blanchard Kelch, NP    Family History Family History  Problem Relation Age of Onset  . Cancer Mother   . Hypertension Mother   . Esophageal cancer Mother   . Cancer Father   . Hypertension Father   . Colon cancer Neg Hx   . Colon polyps Neg Hx   . Rectal cancer Neg Hx   . Stomach cancer Neg Hx     Social History Social History   Tobacco Use  . Smoking status: Light Tobacco Smoker  Packs/day: 0.15    Types: Cigarettes  . Smokeless tobacco: Never Used  . Tobacco comment: 3 per day  Vaping Use  . Vaping Use: Never used  Substance Use Topics  . Alcohol use: No    Alcohol/week: 0.0 standard drinks  . Drug use: No     Allergies   Patient has no known allergies.   Review of Systems Review of Systems PER HPI   Physical Exam Triage Vital Signs ED Triage Vitals  Enc Vitals Group     BP 04/04/20 1201 (!) 168/98     Pulse Rate 04/04/20 1201 80     Resp 04/04/20 1201 18     Temp 04/04/20 1201 98.1 F (36.7 C)     Temp src --      SpO2 04/04/20 1201 96 %     Weight --      Height --      Head Circumference --      Peak Flow --      Pain Score 04/04/20 1205 7     Pain Loc --       Pain Edu? --      Excl. in GC? --    No data found.  Updated Vital Signs BP (!) 168/98   Pulse 80   Temp 98.1 F (36.7 C)   Resp 18   SpO2 96%   Visual Acuity Right Eye Distance: 20/200 Left Eye Distance: 20/40 Bilateral Distance:    Right Eye Near:   Left Eye Near:    Bilateral Near:     Physical Exam Vitals and nursing note reviewed.  Constitutional:      Appearance: Normal appearance.  HENT:     Head: Atraumatic.  Eyes:     General:        Right eye: Discharge (clear) present.     Extraocular Movements: Extraocular movements intact.     Pupils: Pupils are equal, round, and reactive to light.     Comments: Diffuse conjunctival erythema and inflammation  Cardiovascular:     Rate and Rhythm: Normal rate and regular rhythm.  Pulmonary:     Effort: Pulmonary effort is normal.     Breath sounds: Normal breath sounds.  Musculoskeletal:        General: Normal range of motion.     Cervical back: Normal range of motion and neck supple.  Skin:    General: Skin is warm and dry.  Neurological:     General: No focal deficit present.     Mental Status: He is oriented to person, place, and time.  Psychiatric:        Mood and Affect: Mood normal.        Thought Content: Thought content normal.        Judgment: Judgment normal.      UC Treatments / Results  Labs (all labs ordered are listed, but only abnormal results are displayed) Labs Reviewed - No data to display  EKG   Radiology No results found.  Procedures Procedures (including critical care time)  Medications Ordered in UC Medications - No data to display  Initial Impression / Assessment and Plan / UC Course  I have reviewed the triage vital signs and the nursing notes.  Pertinent labs & imaging results that were available during my care of the patient were reviewed by me and considered in my medical decision making (see chart for details).     Visual acuity significantly poorer in right eye, and  having  pain in the eye associated with sxs. Discussed possible emergent causes of his sxs, he does not wish to go to ED and will call his Eye Specialist when he leaves to be evaluated. Will treat with polytrim drops in case simple conjunctivitis in meantime. Discussed if worsening to go immediately to ED.   Final Clinical Impressions(s) / UC Diagnoses   Final diagnoses:  Redness of eye  Eye pain, right   Discharge Instructions   None    ED Prescriptions    Medication Sig Dispense Auth. Provider   trimethoprim-polymyxin b (POLYTRIM) ophthalmic solution Place 1 drop into the right eye every 4 (four) hours. 10 mL Particia Nearing, [place]     PDMP not reviewed this encounter.   Particia Nearing, [place] 04/04/20 1251

## 2020-04-24 DIAGNOSIS — G4700 Insomnia, unspecified: Secondary | ICD-10-CM | POA: Insufficient documentation

## 2020-04-24 NOTE — Assessment & Plan Note (Signed)
Would like to restart lexapro or alternative, however he is not to a point where he will yet consider.

## 2020-04-24 NOTE — Assessment & Plan Note (Signed)
Start amlodipine 5 mg QD.

## 2020-04-24 NOTE — Assessment & Plan Note (Signed)
In the setting of ongoing grief from loss of child. Impacting his work and life. Discussed medication treatment plan however he is not ready to consider this yet.

## 2020-04-24 NOTE — Assessment & Plan Note (Signed)
We called the [facility] line today and helped him get set up for services.

## 2020-04-24 NOTE — Assessment & Plan Note (Signed)
Reviewed plan to call Urology, provided with information. He is apparently due to have an ultrasound and possibly a biopsy with them. He needs further support and explanation for necessary procedures.

## 2020-04-24 NOTE — Assessment & Plan Note (Signed)
Update pertinent labs. Doing well on Triumeq and no indication to change therapy.  Flu vaccine today. He will consider booster with mRNA for COVID

## 2020-04-29 ENCOUNTER — Telehealth: Payer: Self-pay | Admitting: Adult Health

## 2020-04-29 NOTE — Telephone Encounter (Signed)
Patient would like Kandee Keen to give him a call regarding the procedure that he put in orders for him to have.  Please advise.

## 2020-04-30 NOTE — Telephone Encounter (Signed)
Spoke to the pt.  He was scheduled for a bx but has decided he doesn't want to do it.  They will not be putting him to sleep.  He was notified that he will have blood in his urine and stoool for about a month.  The laughing gas is not covered by Medicare and will cost him $90.  He would like to know if Kandee Keen can order the biopsy so that they will put him to sleep.  Please advise.

## 2020-04-30 NOTE — Telephone Encounter (Signed)
I am guessing this is for his prostate? I am unable to order the biopsy as this is done by Urology. I have never had anyone put to sleep for this type of biopsy

## 2020-04-30 NOTE — Telephone Encounter (Signed)
Spoke to the pt and informed him that Kandee Keen did not order the test.  It was ordered by Urology.  Advised him to call Urology and speak to them to see if they could accommodate him in some way.  Pt agreed.  Nothing further needed at this time.

## 2020-06-03 ENCOUNTER — Other Ambulatory Visit: Payer: Self-pay | Admitting: Infectious Diseases

## 2020-06-03 DIAGNOSIS — B20 Human immunodeficiency virus [HIV] disease: Secondary | ICD-10-CM

## 2020-06-04 ENCOUNTER — Ambulatory Visit: Payer: Medicare Other | Admitting: Infectious Diseases

## 2020-06-05 ENCOUNTER — Ambulatory Visit (INDEPENDENT_AMBULATORY_CARE_PROVIDER_SITE_OTHER): Payer: Medicare Other | Admitting: Infectious Diseases

## 2020-06-05 ENCOUNTER — Encounter: Payer: Self-pay | Admitting: Infectious Diseases

## 2020-06-05 ENCOUNTER — Other Ambulatory Visit: Payer: Self-pay

## 2020-06-05 DIAGNOSIS — F4321 Adjustment disorder with depressed mood: Secondary | ICD-10-CM | POA: Diagnosis not present

## 2020-06-05 DIAGNOSIS — B20 Human immunodeficiency virus [HIV] disease: Secondary | ICD-10-CM | POA: Diagnosis not present

## 2020-06-05 DIAGNOSIS — Z634 Disappearance and death of family member: Secondary | ICD-10-CM | POA: Diagnosis not present

## 2020-06-05 DIAGNOSIS — I10 Essential (primary) hypertension: Secondary | ICD-10-CM | POA: Diagnosis not present

## 2020-06-05 MED ORDER — ESCITALOPRAM OXALATE 10 MG PO TABS: 10.0000 mg | ORAL_TABLET | Freq: Every day | ORAL | 2 refills | Status: DC

## 2020-06-05 MED ORDER — AMLODIPINE BESYLATE 10 MG PO TABS: 10.0000 mg | ORAL_TABLET | Freq: Every day | ORAL | 3 refills | Status: DC

## 2020-06-05 MED ORDER — LISINOPRIL-HYDROCHLOROTHIAZIDE 20-25 MG PO TABS: 1.0000 | ORAL_TABLET | Freq: Every day | ORAL | 3 refills | Status: DC

## 2020-06-05 NOTE — Progress Notes (Signed)
Patient ID: Jamie Clark, male   DOB: 1950/12/25, 70 y.o.   MRN: [id number]  CC: HIV follow up care Anxiety / depression Poor sleep - this is better  Blood pressure meds - wondering if he is on good enough regimen.     HPI  Trazodone did not help sleep - made him too cranky. Sleeping much better now, however as his grief is under better control. The anniversary of his son's death however was yesterday which was difficult for him. He did need to reach out to his support system to get through the day at church. He still has not picked up the lexapro but does agree it is something he thinks he should try for depressed mood.   He is happy today with results from prostate biopsy coming back no cancer. Awaiting u/s with ongoing scrotal swelling - working with [facility].   He has continued his Triumeq once a day without any missed doses. No side effects or concerns with regards to HIV managmenet.   He feels that his blood pressure is still too high. Has not seen his regular doctor in a while. Still smoking, but cut down.    Review of Systems  Constitutional: Negative for appetite change, chills, fatigue, fever and unexpected weight change.  Eyes: Negative for visual disturbance.  Respiratory: Negative for cough and shortness of breath.   Cardiovascular: Negative for chest pain and leg swelling.  Gastrointestinal: Negative for abdominal pain, diarrhea and nausea.  Genitourinary: Positive for scrotal swelling. Negative for dysuria, genital sores and penile discharge.  Musculoskeletal: Negative for joint swelling.  Skin: Negative for color change and rash.  Neurological: Negative for dizziness and headaches.  Hematological: Negative for adenopathy.  Psychiatric/Behavioral: Positive for dysphoric mood. Negative for sleep disturbance. The patient is not nervous/anxious.       Outpatient Encounter Medications as of 06/05/2020  Medication Sig  . acetaminophen (TYLENOL) 500  MG tablet Take 2 tablets (1,000 mg total) by mouth every 8 (eight) hours as needed.  . hydrOXYzine (ATARAX/VISTARIL) 25 MG tablet Take 1 tablet (25 mg total) by mouth 3 (three) times daily as needed for anxiety.  [name] ibuprofen (ADVIL) 600 MG tablet Take 1 tablet (600 mg total) by mouth every 6 (six) hours as needed.  . TRIUMEQ 600-50-300 MG tablet TAKE ONE Tablet BY MOUTH EVERY DAY  . [DISCONTINUED] amLODipine (NORVASC) 5 MG tablet Take 1 tablet (5 mg total) by mouth daily.  . [DISCONTINUED] lisinopril-hydrochlorothiazide (ZESTORETIC) 20-25 MG tablet Take 1 tablet by mouth daily.  [name] amLODipine (NORVASC) 10 MG tablet Take 1 tablet (10 mg total) by mouth daily.  [name] escitalopram (LEXAPRO) 10 MG tablet Take 1 tablet (10 mg total) by mouth daily.  [name] lisinopril-hydrochlorothiazide (ZESTORETIC) 20-25 MG tablet Take 1 tablet by mouth daily.  . [DISCONTINUED] escitalopram (LEXAPRO) 10 MG tablet Take 1 tablet (10 mg total) by mouth daily. (Patient not taking: Reported on 06/05/2020)  . [DISCONTINUED] Fluconazole POWD Apply twice a day between all toes.  . [DISCONTINUED] tamsulosin (FLOMAX) 0.4 MG CAPS capsule Take 1 capsule (0.4 mg total) by mouth daily.  . [DISCONTINUED] traZODone (DESYREL) 50 MG tablet Take 1-2 tablets (50-100 mg total) by mouth at bedtime.  . [DISCONTINUED] trimethoprim-polymyxin b (POLYTRIM) ophthalmic solution Place 1 drop into the right eye every 4 (four) hours.   No facility-administered encounter medications on file as of 06/05/2020.     Patient Active Problem List   Diagnosis Date Noted  . Insomnia 04/24/2020  .  Grief at loss of child 07/04/2019  . Anxiety 12/22/2018  . Prostatic hypertrophy 12/22/2018  . Prediabetes 08/22/2018  . Screen for STD (sexually transmitted disease) 08/22/2018  . Dyslipidemia 01/17/2009  . Essential hypertension 01/17/2009  . HIV infection (HCC) 12/18/2008  . Hepatitis C virus infection cured after antiviral drug therapy 07/13/2007     Health  Maintenance Due  Topic Date Due  . COVID-19 Vaccine (2 - Booster for Janssen series) 08/14/2019     Physical Exam  BP (!) 172/81   Pulse 88   Temp 97.7 F (36.5 C) (Oral)   Wt 177 lb (80.3 kg)   BMI 27.72 kg/m    BP Readings from Last 3 Encounters:  06/05/20 (!) 172/81  04/04/20 (!) 168/98  03/01/20 (!) 161/83     Physical Exam Vitals reviewed.  Constitutional:      Appearance: Normal appearance. He is not ill-appearing.  HENT:     Head: Normocephalic.     Mouth/Throat:     Mouth: Mucous membranes are moist.     Pharynx: Oropharynx is clear.  Eyes:     General: No scleral icterus. Cardiovascular:     Rate and Rhythm: Normal rate and regular rhythm.  Pulmonary:     Effort: Pulmonary effort is normal.     Breath sounds: Normal breath sounds.  Abdominal:     General: There is no distension.     Palpations: Abdomen is soft.  Musculoskeletal:        General: Normal range of motion.     Cervical back: Normal range of motion.  Skin:    Coloration: Skin is not jaundiced or pale.  Neurological:     Mental Status: He is alert and oriented to person, place, and time.  Psychiatric:        Mood and Affect: Mood normal.        Behavior: Behavior normal.        Judgment: Judgment normal.      Lab Results  Component Value Date   CD4TCELL 46 03/05/2020   Lab Results  Component Value Date   CD4TABS 905 03/05/2020   CD4TABS 949 08/30/2019   CD4TABS 849 03/01/2019   Lab Results  Component Value Date   HIV1RNAQUANT <20 03/05/2020   Lab Results  Component Value Date   HEPBSAB NEG 05/06/2010   Lab Results  Component Value Date   LABRPR NON-REACTIVE 08/30/2019    CBC Lab Results  Component Value Date   WBC 4.1 12/14/2019   RBC 4.01 (L) 12/14/2019   HGB 14.2 12/14/2019   HCT 39.8 12/14/2019   PLT 264 12/14/2019   MCV 99.3 12/14/2019   MCH 35.4 (H) 12/14/2019   MCHC 35.7 12/14/2019   RDW 14.3 12/14/2019   LYMPHSABS 1,812 12/14/2019   MONOABS 0.3  09/14/2017   EOSABS 139 12/14/2019    BMET Lab Results  Component Value Date   NA 138 12/14/2019   K 3.8 12/14/2019   CL 105 12/14/2019   CO2 25 12/14/2019   GLUCOSE 102 (H) 12/14/2019   BUN 14 12/14/2019   CREATININE 1.23 12/14/2019   CALCIUM 9.3 12/14/2019   GFRNONAA 60 12/14/2019   GFRAA 70 12/14/2019    Assessment and Plan Problem List Items Addressed This Visit      Unprioritized   HIV infection (HCC) (Chronic)    Kani has long standing well controlled HIV disease with last VL < 20 and CD4 recovered into the normal range. We don't need labs today give  his great track record and adherence. No changes to treatment plan. Will plan to repeat routine labs for HIV care in 6 months.  Vaccines up to date.       Grief at loss of child    He seems to be in better spirits today. Sleeping better fortunately. Back at work and less foggy mentally. I will send in Lexapro to his pharmacy to him again and asked him to please start taking it. Suggested to take at night incase it makes him sleepy. FU televisit in 1 month.       Relevant Medications   escitalopram (LEXAPRO) 10 MG tablet   Essential hypertension    Above target - will increase amlodipine to 10 mg QD, continue zestoric 20-25. May need to add extra lisinopril 10-20 mg tablet. I asked him to please check in with his primary care as they are likely a better resource to treat this problem for him.       Relevant Medications   amLODipine (NORVASC) 10 MG tablet   lisinopril-hydrochlorothiazide (ZESTORETIC) 20-25 MG tablet       Rexene Alberts, MSN, NP-C [facility]  [email or web address] Pager: [phone] Office: [phone] [facility] Main Line: [phone]

## 2020-06-05 NOTE — Assessment & Plan Note (Signed)
He seems to be in better spirits today. Sleeping better fortunately. Back at work and less foggy mentally. I will send in Lexapro to his pharmacy to him again and asked him to please start taking it. Suggested to take at night incase it makes him sleepy. FU televisit in 1 month.

## 2020-06-05 NOTE — Assessment & Plan Note (Signed)
Jamie Clark has long standing well controlled HIV disease with last VL < 20 and CD4 recovered into the normal range. We don't need labs today give his great track record and adherence. No changes to treatment plan. Will plan to repeat routine labs for HIV care in 6 months.  Vaccines up to date.

## 2020-06-05 NOTE — Assessment & Plan Note (Signed)
Above target - will increase amlodipine to 10 mg QD, continue zestoric 20-25. May need to add extra lisinopril 10-20 mg tablet. I asked him to please check in with his primary care as they are likely a better resource to treat this problem for him.

## 2020-06-05 NOTE — Patient Instructions (Addendum)
Please continue your blood pressure pills - lisinopril-hctz combo pill and amlodipine.  We need to INCREASE your amlodipine to 10 mg once a day. I will send in a new prescription for you to pick this up.   Will also send in refills for your lexapro - start this once a day for your depression.   Please schedule a telephone visit with me again in 1 month to see how your depression is doing.   So glad your sleep is better!

## 2020-06-11 ENCOUNTER — Other Ambulatory Visit: Payer: Self-pay | Admitting: Urology

## 2020-06-11 DIAGNOSIS — N503 Cyst of epididymis: Secondary | ICD-10-CM

## 2020-06-26 ENCOUNTER — Ambulatory Visit
Admission: RE | Admit: 2020-06-26 | Discharge: 2020-06-26 | Disposition: A | Discharge status: Home / Self Care | Payer: Medicare Other | Source: Ambulatory Visit | Attending: Urology | Admitting: Urology

## 2020-06-26 DIAGNOSIS — N503 Cyst of epididymis: Secondary | ICD-10-CM

## 2020-06-28 ENCOUNTER — Telehealth: Payer: Self-pay | Admitting: Adult Health

## 2020-06-28 ENCOUNTER — Encounter: Payer: Self-pay | Admitting: Adult Health

## 2020-06-28 NOTE — Telephone Encounter (Signed)
Entered in error

## 2020-07-02 ENCOUNTER — Telehealth (INDEPENDENT_AMBULATORY_CARE_PROVIDER_SITE_OTHER): Payer: Medicare Other | Admitting: Infectious Diseases

## 2020-07-02 ENCOUNTER — Other Ambulatory Visit: Payer: Self-pay

## 2020-07-02 DIAGNOSIS — Z5329 Procedure and treatment not carried out because of patient's decision for other reasons: Secondary | ICD-10-CM

## 2020-07-02 NOTE — Progress Notes (Deleted)
Patient ID: Jamie Clark, male   DOB: [birth year], 70 y.o.   MRN: [id number]  CC: HIV follow up care Anxiety / depression Poor sleep - this is better  Blood pressure meds - wondering if he is on good enough regimen.     HPI  Trazodone did not help sleep - made him too cranky. Sleeping much better now, however as his grief is under better control. The anniversary of his son's death however was yesterday which was difficult for him. He did need to reach out to his support system to get through the day at church. He still has not picked up the lexapro but does agree it is something he thinks he should try for depressed mood.   He is happy today with results from prostate biopsy coming back no cancer. Awaiting u/s with ongoing scrotal swelling - working with [facility].   He has continued his Triumeq once a day without any missed doses. No side effects or concerns with regards to HIV managmenet.   He feels that his blood pressure is still too high. Has not seen his regular doctor in a while. Still smoking, but cut down.    Review of Systems  Constitutional: Negative for appetite change, chills, fatigue, fever and unexpected weight change.  Eyes: Negative for visual disturbance.  Respiratory: Negative for cough and shortness of breath.   Cardiovascular: Negative for chest pain and leg swelling.  Gastrointestinal: Negative for abdominal pain, diarrhea and nausea.  Genitourinary: Positive for scrotal swelling. Negative for dysuria, genital sores and penile discharge.  Musculoskeletal: Negative for joint swelling.  Skin: Negative for color change and rash.  Neurological: Negative for dizziness and headaches.  Hematological: Negative for adenopathy.  Psychiatric/Behavioral: Positive for dysphoric mood. Negative for sleep disturbance. The patient is not nervous/anxious.       Outpatient Encounter Medications as of 07/02/2020  Medication Sig  . amLODipine (NORVASC) 10 MG  tablet Take 1 tablet (10 mg total) by mouth daily.  [name] escitalopram (LEXAPRO) 10 MG tablet Take 1 tablet (10 mg total) by mouth daily.  . hydrOXYzine (ATARAX/VISTARIL) 25 MG tablet Take 1 tablet (25 mg total) by mouth 3 (three) times daily as needed for anxiety.  [name] lisinopril-hydrochlorothiazide (ZESTORETIC) 20-25 MG tablet Take 1 tablet by mouth daily.  . TRIUMEQ 600-50-300 MG tablet TAKE ONE Tablet BY MOUTH EVERY DAY  . acetaminophen (TYLENOL) 500 MG tablet Take 2 tablets (1,000 mg total) by mouth every 8 (eight) hours as needed.  [name] ibuprofen (ADVIL) 600 MG tablet Take 1 tablet (600 mg total) by mouth every 6 (six) hours as needed.   No facility-administered encounter medications on file as of 07/02/2020.     Patient Active Problem List   Diagnosis Date Noted  . Insomnia 04/24/2020  . Grief at loss of child 07/04/2019  . Anxiety 12/22/2018  . Prostatic hypertrophy 12/22/2018  . Prediabetes 08/22/2018  . Screen for STD (sexually transmitted disease) 08/22/2018  . Dyslipidemia 01/17/2009  . Essential hypertension 01/17/2009  . HIV infection (HCC) 12/18/2008  . Hepatitis C virus infection cured after antiviral drug therapy 07/13/2007     Health Maintenance Due  Topic Date Due  . COVID-19 Vaccine (2 - Booster for Janssen series) 08/14/2019     Physical Exam  There were no vitals taken for this visit.   BP Readings from Last 3 Encounters:  06/05/20 (!) 172/81  04/04/20 (!) 168/98  03/01/20 (!) 161/83     Physical Exam Vitals reviewed.  Constitutional:      Appearance: Normal appearance. He is not ill-appearing.  HENT:     Head: Normocephalic.     Mouth/Throat:     Mouth: Mucous membranes are moist.     Pharynx: Oropharynx is clear.  Eyes:     General: No scleral icterus. Cardiovascular:     Rate and Rhythm: Normal rate and regular rhythm.  Pulmonary:     Effort: Pulmonary effort is normal.     Breath sounds: Normal breath sounds.  Abdominal:     General: There is  no distension.     Palpations: Abdomen is soft.  Musculoskeletal:        General: Normal range of motion.     Cervical back: Normal range of motion.  Skin:    Coloration: Skin is not jaundiced or pale.  Neurological:     Mental Status: He is alert and oriented to person, place, and time.  Psychiatric:        Mood and Affect: Mood normal.        Behavior: Behavior normal.        Judgment: Judgment normal.      Lab Results  Component Value Date   CD4TCELL 46 03/05/2020   Lab Results  Component Value Date   CD4TABS 905 03/05/2020   CD4TABS 949 08/30/2019   CD4TABS 849 03/01/2019   Lab Results  Component Value Date   HIV1RNAQUANT <20 03/05/2020   Lab Results  Component Value Date   HEPBSAB NEG 05/06/2010   Lab Results  Component Value Date   LABRPR NON-REACTIVE 08/30/2019    CBC Lab Results  Component Value Date   WBC 4.1 12/14/2019   RBC 4.01 (L) 12/14/2019   HGB 14.2 12/14/2019   HCT 39.8 12/14/2019   PLT 264 12/14/2019   MCV 99.3 12/14/2019   MCH 35.4 (H) 12/14/2019   MCHC 35.7 12/14/2019   RDW 14.3 12/14/2019   LYMPHSABS 1,812 12/14/2019   MONOABS 0.3 09/14/2017   EOSABS 139 12/14/2019    BMET Lab Results  Component Value Date   NA 138 12/14/2019   K 3.8 12/14/2019   CL 105 12/14/2019   CO2 25 12/14/2019   GLUCOSE 102 (H) 12/14/2019   BUN 14 12/14/2019   CREATININE 1.23 12/14/2019   CALCIUM 9.3 12/14/2019   GFRNONAA 60 12/14/2019   GFRAA 70 12/14/2019    Assessment and Plan Problem List Items Addressed This Visit   None      Rexene Alberts, MSN, NP-C [facility]  [email or web address] Pager: [phone] Office: [phone] [facility] Main Line: [phone]

## 2020-07-04 DIAGNOSIS — R3912 Poor urinary stream: Secondary | ICD-10-CM | POA: Diagnosis not present
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
## 2020-07-26 DIAGNOSIS — I10 Essential (primary) hypertension: Secondary | ICD-10-CM | POA: Diagnosis not present

## 2020-07-26 DIAGNOSIS — Z20822 Contact with and (suspected) exposure to covid-19: Secondary | ICD-10-CM | POA: Diagnosis not present

## 2020-07-26 DIAGNOSIS — Z03818 Encounter for observation for suspected exposure to other biological agents ruled out: Secondary | ICD-10-CM | POA: Diagnosis not present

## 2020-07-29 ENCOUNTER — Other Ambulatory Visit: Payer: Self-pay | Admitting: Infectious Diseases

## 2020-07-29 DIAGNOSIS — F4321 Adjustment disorder with depressed mood: Secondary | ICD-10-CM

## 2020-07-29 DIAGNOSIS — Z634 Disappearance and death of family member: Secondary | ICD-10-CM

## 2020-07-30 ENCOUNTER — Other Ambulatory Visit: Payer: Self-pay | Admitting: Infectious Diseases

## 2020-07-30 DIAGNOSIS — B20 Human immunodeficiency virus [HIV] disease: Secondary | ICD-10-CM

## 2020-07-30 NOTE — Telephone Encounter (Signed)
Appointment scheduled 5/11

## 2020-07-30 NOTE — Telephone Encounter (Signed)
Appt 07/31/20.

## 2020-07-31 ENCOUNTER — Other Ambulatory Visit: Payer: Self-pay

## 2020-07-31 ENCOUNTER — Telehealth (INDEPENDENT_AMBULATORY_CARE_PROVIDER_SITE_OTHER): Payer: Medicare Other | Admitting: Infectious Diseases

## 2020-07-31 DIAGNOSIS — F4321 Adjustment disorder with depressed mood: Secondary | ICD-10-CM

## 2020-07-31 DIAGNOSIS — Z634 Disappearance and death of family member: Secondary | ICD-10-CM

## 2020-07-31 DIAGNOSIS — F419 Anxiety disorder, unspecified: Secondary | ICD-10-CM

## 2020-07-31 DIAGNOSIS — F5101 Primary insomnia: Secondary | ICD-10-CM

## 2020-07-31 DIAGNOSIS — B20 Human immunodeficiency virus [HIV] disease: Secondary | ICD-10-CM

## 2020-07-31 MED ORDER — ESCITALOPRAM OXALATE 5 MG PO TABS: 5.0000 mg | ORAL_TABLET | Freq: Every day | ORAL | 1 refills | Status: DC

## 2020-07-31 MED ORDER — TRAZODONE HCL 50 MG PO TABS: 50.0000 mg | ORAL_TABLET | Freq: Every day | ORAL | 1 refills | Status: DC

## 2020-07-31 NOTE — Telephone Encounter (Signed)
Please adjust medication If needed. Thanks

## 2020-07-31 NOTE — Patient Instructions (Signed)
Start taking the Lexapro 1/2 a tablet every evening

## 2020-07-31 NOTE — Progress Notes (Signed)
Patient ID: Jamie Clark, male   DOB: [birth year], 70 y.o.   MRN: [id number]   VIRTUAL CARE ENCOUNTER  I connected with Edrei Norgaard on 09/12/20 at  3:30 PM EDT by TELEPHONE and verified that I am speaking with the correct person using two identifiers.   I discussed the limitations, risks, security and privacy concerns of performing an evaluation and management service by telephone and the availability of in person appointments. I also discussed with the patient that there may be a patient responsible charge related to this service. The patient expressed understanding and agreed to proceed.  Patient Location: [place] residence / work   Other Participants: none  Provider Location: [facility] Office    CC: Feeling much better since starting medications. Sleep better.    HPI Jamie Clark has been doing better since his last visit when we added back antidepressants and sleep medication. He does have a hard time taking a higher dose of the lexapro and wondered about cutting this in half or reducing it totally.    Review of Systems    Outpatient Encounter Medications as of 07/31/2020  Medication Sig   acetaminophen (TYLENOL) 500 MG tablet Take 2 tablets (1,000 mg total) by mouth every 8 (eight) hours as needed.   hydrOXYzine (ATARAX/VISTARIL) 25 MG tablet Take 1 tablet (25 mg total) by mouth 3 (three) times daily as needed for anxiety.   ibuprofen (ADVIL) 600 MG tablet Take 1 tablet (600 mg total) by mouth every 6 (six) hours as needed.   lisinopril-hydrochlorothiazide (ZESTORETIC) 20-25 MG tablet Take 1 tablet by mouth daily.   [DISCONTINUED] amLODipine (NORVASC) 10 MG tablet Take 1 tablet (10 mg total) by mouth daily.   [DISCONTINUED] escitalopram (LEXAPRO) 10 MG tablet Take 1 tablet (10 mg total) by mouth daily.   [DISCONTINUED] escitalopram (LEXAPRO) 5 MG tablet Take 1 tablet (5 mg total) by mouth daily.   [DISCONTINUED] traZODone (DESYREL) 50 MG tablet Take 1 tablet (50 mg total)  by mouth at bedtime.   [DISCONTINUED] TRIUMEQ 600-50-300 MG tablet TAKE ONE Tablet BY MOUTH EVERY DAY   No facility-administered encounter medications on file as of 07/31/2020.     Patient Active Problem List   Diagnosis Date Noted   Insomnia 04/24/2020   Grief at loss of child 07/04/2019   Anxiety 12/22/2018   Prostatic hypertrophy 12/22/2018   Prediabetes 08/22/2018   Screen for STD (sexually transmitted disease) 08/22/2018   Dyslipidemia 01/17/2009   Essential hypertension 01/17/2009   HIV infection (HCC) 12/18/2008   Hepatitis C virus infection cured after antiviral drug therapy 07/13/2007     Health Maintenance Due  Topic Date Due   Zoster Vaccines- Shingrix (1 of 2) Never done   COVID-19 Vaccine (2 - Janssen risk series) 07/17/2019     Physical Exam  There were no vitals taken for this visit.    Physical Exam Pulmonary:     Effort: Pulmonary effort is normal.     Comments: No shortness of breath detected in conversation.  Neurological:     Mental Status: He is oriented to person, place, and time.  Psychiatric:        Mood and Affect: Mood normal.        Behavior: Behavior normal.        Thought Content: Thought content normal.        Judgment: Judgment normal.     Lab Results  Component Value Date   CD4TCELL 46 03/05/2020   Lab Results  Component Value Date  CD4TABS 905 03/05/2020   CD4TABS 949 08/30/2019   CD4TABS 849 03/01/2019   Lab Results  Component Value Date   HIV1RNAQUANT <20 03/05/2020   Lab Results  Component Value Date   HEPBSAB NEG 05/06/2010   Lab Results  Component Value Date   LABRPR NON-REACTIVE 08/30/2019    CBC Lab Results  Component Value Date   WBC 4.1 12/14/2019   RBC 4.01 (L) 12/14/2019   HGB 14.2 12/14/2019   HCT 39.8 12/14/2019   PLT 264 12/14/2019   MCV 99.3 12/14/2019   MCH 35.4 (H) 12/14/2019   MCHC 35.7 12/14/2019   RDW 14.3 12/14/2019   LYMPHSABS 1,812 12/14/2019   MONOABS 0.3 09/14/2017   EOSABS 139  12/14/2019    BMET Lab Results  Component Value Date   NA 138 12/14/2019   K 3.8 12/14/2019   CL 105 12/14/2019   CO2 25 12/14/2019   GLUCOSE 102 (H) 12/14/2019   BUN 14 12/14/2019   CREATININE 1.23 12/14/2019   CALCIUM 9.3 12/14/2019   GFRNONAA 60 12/14/2019   GFRAA 70 12/14/2019    Assessment and Plan Problem List Items Addressed This Visit       Unprioritized   Grief at loss of child   Insomnia    Continue trazodone 50 mg QHS can increase to 100 if needed.        Anxiety - Primary    Having some good effect from lexapro but may be too high a dose for him given his fatigue. Will have him reduce to 5 mg QHS and see how that helps mitigate some side effects. Could consider increase back at return visit in 103m after more time on it.        Other Visit Diagnoses     Human immunodeficiency virus (HIV) disease (HCC)          Follow Up Instructions: As above    I discussed the assessment and treatment plan with the patient. The patient was provided an opportunity to ask questions and all were answered. The patient agreed with the plan and demonstrated an understanding of the instructions.   The patient was advised to call back or seek an in-person evaluation if the symptoms worsen or if the condition fails to improve as anticipated.  I provided 8 minutes of non-face-to-face time during this encounter.   Rexene Alberts, MSN, NP-C [facility]  [email or web address] Pager: [phone] Office: [phone] [facility] Main Line: [phone]    Rexene Alberts, MSN, NP-C [facility]  [email or web address] Pager: [phone] Office: [phone] [facility] Main Line: [phone]

## 2020-08-12 ENCOUNTER — Encounter: Payer: Self-pay | Admitting: Infectious Diseases

## 2020-08-23 ENCOUNTER — Telehealth: Payer: Self-pay | Admitting: Adult Health

## 2020-08-23 NOTE — Chronic Care Management (AMB) (Signed)
  Chronic Care Management   Outreach Note  08/23/2020 Name: Jamie Clark MRN: [id number] DOB: November 30, 1950  Referred by: Shirline Frees, NP Reason for referral : No chief complaint on file.   An unsuccessful telephone outreach was attempted today. The patient was referred to the pharmacist for assistance with care management and care coordination.   Follow Up Plan:   Tatjana Dellinger Upstream Scheduler

## 2020-08-26 ENCOUNTER — Ambulatory Visit (HOSPITAL_COMMUNITY)
Admission: EM | Admit: 2020-08-26 | Discharge: 2020-08-26 | Disposition: A | Discharge status: Home / Self Care | Payer: Medicare Other | Attending: Emergency Medicine | Admitting: Emergency Medicine

## 2020-08-26 ENCOUNTER — Encounter (HOSPITAL_COMMUNITY): Payer: Self-pay | Admitting: Emergency Medicine

## 2020-08-26 DIAGNOSIS — J069 Acute upper respiratory infection, unspecified: Secondary | ICD-10-CM | POA: Diagnosis not present

## 2020-08-26 MED ORDER — FLUTICASONE PROPIONATE 50 MCG/ACT NA SUSP: 2.0000 | Freq: Every day | NASAL | 0 refills | Status: DC

## 2020-08-26 NOTE — ED Provider Notes (Signed)
[facility]    CSN: [id number] Arrival date & time: 08/26/20  1241      History   Chief Complaint Chief Complaint  Patient presents with  . Nasal Congestion  . Headache    HPI Jamie Clark is a 70 y.o. male.   Patient evaluated nasal congestion, fatigue, and headache that have been ongoing since last Thursday or Friday.  Reports taking Claritin and Sudafed with minimal relief.  Reports headache has improved.  Denies any recent sick contacts.  Denies any trauma, injury, or other precipitating event.  Denies any specific alleviating or aggravating factors.  Denies any fevers, chest pain, shortness of breath, N/V/D, numbness, tingling, weakness, abdominal pain, or headaches.    The history is provided by the patient.    Past Medical History:  Diagnosis Date  . Allergy   . Anxiety   . Depression   . Hepatitis C, chronic (HCC)   . HIV positive (HCC)   . Hypertension     Patient Active Problem List   Diagnosis Date Noted  . Insomnia 04/24/2020  . Grief at loss of child 07/04/2019  . Anxiety 12/22/2018  . Prostatic hypertrophy 12/22/2018  . Prediabetes 08/22/2018  . Screen for STD (sexually transmitted disease) 08/22/2018  . Dyslipidemia 01/17/2009  . Essential hypertension 01/17/2009  . HIV infection (HCC) 12/18/2008  . Hepatitis C virus infection cured after antiviral drug therapy 07/13/2007    Past Surgical History:  Procedure Laterality Date  . NO PAST SURGERIES         Home Medications    Prior to Admission medications   Medication Sig Start Date End Date Taking? Authorizing Provider  fluticasone (FLONASE) 50 MCG/ACT nasal spray Place 2 sprays into both nostrils daily. 08/26/20  Yes Ivette Loyal, NP  acetaminophen (TYLENOL) 500 MG tablet Take 2 tablets (1,000 mg total) by mouth every 8 (eight) hours as needed. 11/09/19   Darr, Gerilyn Pilgrim, PA-C  amLODipine (NORVASC) 10 MG tablet Take 1 tablet (10 mg total) by mouth daily. 06/05/20   Blanchard Kelch, NP  escitalopram (LEXAPRO) 5 MG tablet Take 1 tablet (5 mg total) by mouth daily. 07/31/20   Blanchard Kelch, NP  hydrOXYzine (ATARAX/VISTARIL) 25 MG tablet Take 1 tablet (25 mg total) by mouth 3 (three) times daily as needed for anxiety. 03/21/20   Blanchard Kelch, NP  ibuprofen (ADVIL) 600 MG tablet Take 1 tablet (600 mg total) by mouth every 6 (six) hours as needed. 02/02/20   Eustace Moore, MD  lisinopril-hydrochlorothiazide (ZESTORETIC) 20-25 MG tablet Take 1 tablet by mouth daily. 06/05/20   Blanchard Kelch, NP  traZODone (DESYREL) 50 MG tablet Take 1 tablet (50 mg total) by mouth at bedtime. 07/31/20   Blanchard Kelch, NP  TRIUMEQ 600-50-300 MG tablet TAKE ONE Tablet BY MOUTH ONCE A DAY 07/31/20   Blanchard Kelch, NP    Family History Family History  Problem Relation Age of Onset  . Cancer Mother   . Hypertension Mother   . Esophageal cancer Mother   . Cancer Father   . Hypertension Father   . Colon cancer Neg Hx   . Colon polyps Neg Hx   . Rectal cancer Neg Hx   . Stomach cancer Neg Hx     Social History Social History   Tobacco Use  . Smoking status: Light Tobacco Smoker    Packs/day: 0.15    Types: Cigarettes  . Smokeless tobacco: Never Used  . Tobacco comment:  3 per day  Vaping Use  . Vaping Use: Never used  Substance Use Topics  . Alcohol use: No    Alcohol/week: 0.0 standard drinks  . Drug use: No     Allergies   Patient has no known allergies.   Review of Systems Review of Systems  Constitutional: Positive for fatigue.  HENT: Positive for congestion and sinus pain.   Neurological: Positive for headaches.  All other systems reviewed and are negative.    Physical Exam Triage Vital Signs ED Triage Vitals  Enc Vitals Group     BP 08/26/20 1407 (!) 152/93     Pulse Rate 08/26/20 1407 75     Resp 08/26/20 1407 18     Temp 08/26/20 1407 97.8 F (36.6 C)     Temp Source 08/26/20 1407 Oral     SpO2 08/26/20 1407 93 %      Weight --      Height --      Head Circumference --      Peak Flow --      Pain Score 08/26/20 1409 0     Pain Loc --      Pain Edu? --      Excl. in GC? --    No data found.  Updated Vital Signs BP (!) 152/93   Pulse 75   Temp 97.8 F (36.6 C) (Oral)   Resp 18   SpO2 93%   Visual Acuity Right Eye Distance:   Left Eye Distance:   Bilateral Distance:    Right Eye Near:   Left Eye Near:    Bilateral Near:     Physical Exam Vitals and nursing note reviewed.  Constitutional:      General: He is not in acute distress.    Appearance: Normal appearance. He is not ill-appearing, toxic-appearing or diaphoretic.  HENT:     Head: Normocephalic and atraumatic.     Right Ear: Tympanic membrane, ear canal and external ear normal.     Left Ear: Tympanic membrane, ear canal and external ear normal.     Nose: Nose normal.     Mouth/Throat:     Mouth: Mucous membranes are moist.     Pharynx: No oropharyngeal exudate or posterior oropharyngeal erythema.  Eyes:     Conjunctiva/sclera: Conjunctivae normal.  Cardiovascular:     Rate and Rhythm: Normal rate.     Pulses: Normal pulses.     Heart sounds: Normal heart sounds.  Pulmonary:     Effort: Pulmonary effort is normal.     Breath sounds: Normal breath sounds.  Abdominal:     General: Abdomen is flat.  Musculoskeletal:        General: Normal range of motion.     Cervical back: Normal range of motion.  Skin:    General: Skin is warm and dry.  Neurological:     General: No focal deficit present.     Mental Status: He is alert and oriented to person, place, and time.     GCS: GCS eye subscore is 4. GCS verbal subscore is 5. GCS motor subscore is 6.  Psychiatric:        Mood and Affect: Mood normal.      UC Treatments / Results  Labs (all labs ordered are listed, but only abnormal results are displayed) Labs Reviewed - No data to display  EKG   Radiology No results found.  Procedures Procedures (including  critical care time)  Medications Ordered in UC  Medications - No data to display  Initial Impression / Assessment and Plan / UC Course  I have reviewed the triage vital signs and the nursing notes.  Pertinent labs & imaging results that were available during my care of the patient were reviewed by me and considered in my medical decision making (see chart for details).    Assessment negative for red flag or concerns.  Likely viral URI.  Discussed conservative symptom management as described in discharge instructions.  Continue to use Claritin.  Use Flonase 2 sprays in each nostril daily.  May continue to use Sudafed as needed for congestion.  Follow up with primary care as needed.   Final Clinical Impressions(s) / UC Diagnoses   Final diagnoses:  Viral upper respiratory tract infection     Discharge Instructions     You most likely have viral illness.   You can take Tylenol and/or Ibuprofen as needed for fever reduction and pain relief.   Continue to take the Claritin daily.  Start using the Flonase 2 sprays in each nostril daily.  You can continue to use Sudafed as needed for congestion.     For cough: honey 1/2 to 1 teaspoon (you can dilute the honey in water or another fluid).  You can also use guaifenesin and dextromethorphan for cough. You can use a humidifier for chest congestion and cough.  If you don't have a humidifier, you can sit in the bathroom with the hot shower running.     For sore throat: try warm salt water gargles, cepacol lozenges, throat spray, warm tea or water with lemon/honey, popsicles or ice, or OTC cold relief medicine for throat discomfort.    For congestion: take a daily anti-histamine like Zyrtec, Claritin, and a oral decongestant, such as pseudoephedrine.  You can also use Flonase 1-2 sprays in each nostril daily.    It is important to stay hydrated: drink plenty of fluids (water, gatorade/powerade/pedialyte, juices, or teas) to keep your throat  moisturized and help further relieve irritation/discomfort.   Return or go to the Emergency Department if symptoms worsen or do not improve in the next few days.     ED Prescriptions    Medication Sig Dispense Auth. Provider   fluticasone (FLONASE) 50 MCG/ACT nasal spray Place 2 sprays into both nostrils daily. 18.2 mL Ivette Loyal, NP     PDMP not reviewed this encounter.   Ivette Loyal, NP 08/26/20 1438

## 2020-08-26 NOTE — ED Triage Notes (Signed)
Pt is present today with sinus congestion, nasal congestion, and HA . Pt states that his sx started over the past weekend.

## 2020-08-26 NOTE — Discharge Instructions (Addendum)
You most likely have viral illness.   You can take Tylenol and/or Ibuprofen as needed for fever reduction and pain relief.   Continue to take the Claritin daily.  Start using the Flonase 2 sprays in each nostril daily.  You can continue to use Sudafed as needed for congestion.     For cough: honey 1/2 to 1 teaspoon (you can dilute the honey in water or another fluid).  You can also use guaifenesin and dextromethorphan for cough. You can use a humidifier for chest congestion and cough.  If you don't have a humidifier, you can sit in the bathroom with the hot shower running.     For sore throat: try warm salt water gargles, cepacol lozenges, throat spray, warm tea or water with lemon/honey, popsicles or ice, or OTC cold relief medicine for throat discomfort.    For congestion: take a daily anti-histamine like Zyrtec, Claritin, and a oral decongestant, such as pseudoephedrine.  You can also use Flonase 1-2 sprays in each nostril daily.    It is important to stay hydrated: drink plenty of fluids (water, gatorade/powerade/pedialyte, juices, or teas) to keep your throat moisturized and help further relieve irritation/discomfort.   Return or go to the Emergency Department if symptoms worsen or do not improve in the next few days.

## 2020-08-29 ENCOUNTER — Other Ambulatory Visit: Payer: Self-pay | Admitting: Infectious Diseases

## 2020-08-29 DIAGNOSIS — F5101 Primary insomnia: Secondary | ICD-10-CM

## 2020-08-29 DIAGNOSIS — F4321 Adjustment disorder with depressed mood: Secondary | ICD-10-CM

## 2020-08-29 DIAGNOSIS — Z634 Disappearance and death of family member: Secondary | ICD-10-CM

## 2020-08-29 DIAGNOSIS — F419 Anxiety disorder, unspecified: Secondary | ICD-10-CM

## 2020-08-29 DIAGNOSIS — B20 Human immunodeficiency virus [HIV] disease: Secondary | ICD-10-CM

## 2020-08-29 DIAGNOSIS — I10 Essential (primary) hypertension: Secondary | ICD-10-CM

## 2020-09-12 NOTE — Assessment & Plan Note (Signed)
Continue trazodone 50 mg QHS can increase to 100 if needed.

## 2020-09-12 NOTE — Assessment & Plan Note (Signed)
Having some good effect from lexapro but may be too high a dose for him given his fatigue. Will have him reduce to 5 mg QHS and see how that helps mitigate some side effects. Could consider increase back at return visit in 42m after more time on it.

## 2020-09-18 ENCOUNTER — Other Ambulatory Visit: Payer: Self-pay | Admitting: Family

## 2020-09-18 ENCOUNTER — Telehealth: Payer: Self-pay | Admitting: Adult Health

## 2020-09-18 DIAGNOSIS — B20 Human immunodeficiency virus [HIV] disease: Secondary | ICD-10-CM

## 2020-09-18 NOTE — Chronic Care Management (AMB) (Signed)
  Chronic Care Management   Outreach Note  09/18/2020 Name: Raffaele Derise MRN: [id number] DOB: 03-Feb-1951  Referred by: Shirline Frees, NP Reason for referral : No chief complaint on file.   A second unsuccessful telephone outreach was attempted today. The patient was referred to pharmacist for assistance with care management and care coordination.  Follow Up Plan:   Tatjana Dellinger Upstream Scheduler

## 2020-10-08 ENCOUNTER — Telehealth: Payer: Self-pay | Admitting: Adult Health

## 2020-10-08 NOTE — Chronic Care Management (AMB) (Signed)
  Chronic Care Management   Outreach Note  10/08/2020 Name: Jamie Clark MRN: [id number] DOB: 02-21-1951  Referred by: Shirline Frees, NP Reason for referral : No chief complaint on file.   Third unsuccessful telephone outreach was attempted today. The patient was referred to the pharmacist for assistance with care management and care coordination.   Follow Up Plan:   Tatjana Dellinger Upstream Scheduler

## 2020-10-08 NOTE — Chronic Care Management (AMB) (Signed)
  Chronic Care Management   Note  10/08/2020 Name: Yosgar Demirjian MRN: [id number] DOB: 1950-12-19  Ron Beske is a 70 y.o. year old male who is a primary care patient of Shirline Frees, NP. I reached out to [employer] by phone today in response to a referral sent by Mr. Lucciano Justo's PCP, Shirline Frees, NP.   Mr. Juhasz was given information about Chronic Care Management services today including:  CCM service includes personalized support from designated clinical staff supervised by his physician, including individualized plan of care and coordination with other care providers 24/7 contact phone numbers for assistance for urgent and routine care needs. Service will only be billed when office clinical staff spend 20 minutes or more in a month to coordinate care. Only one practitioner may furnish and bill the service in a calendar month. The patient may stop CCM services at any time (effective at the end of the month) by phone call to the office staff.   Patient agreed to services and verbal consent obtained.   Follow up plan:   Tatjana Restaurant manager, fast food

## 2020-11-12 ENCOUNTER — Other Ambulatory Visit: Payer: Self-pay | Admitting: Infectious Diseases

## 2020-11-12 DIAGNOSIS — F4321 Adjustment disorder with depressed mood: Secondary | ICD-10-CM

## 2020-11-12 DIAGNOSIS — F5101 Primary insomnia: Secondary | ICD-10-CM

## 2020-11-12 DIAGNOSIS — Z634 Disappearance and death of family member: Secondary | ICD-10-CM

## 2020-11-12 DIAGNOSIS — B20 Human immunodeficiency virus [HIV] disease: Secondary | ICD-10-CM

## 2020-11-12 DIAGNOSIS — F419 Anxiety disorder, unspecified: Secondary | ICD-10-CM

## 2020-11-29 ENCOUNTER — Telehealth: Payer: Self-pay | Admitting: Pharmacist

## 2020-11-29 NOTE — Chronic Care Management (AMB) (Signed)
Chronic Care Management Pharmacy Assistant   Name: Jamie Clark  MRN: [id number] DOB: 1950-09-06  Jamie Clark is an 70 y.o. year old male who presents for his initial CCM visit with the clinical pharmacist.  Reason for Encounter: Chart prep for initial visit with [name] the Clinical Pharmacist on 12/04/2020   Conditions to be addressed/monitored: HTN, HLD, Anxiety, and Prediabetes and Insomnia  Primary concerns for visit include:   Recent office visits:  none  Recent consult visits:  07/31/2020 Rexene Alberts NP  Patient was seen for Anxiety and other issues. Trazodone 50mg  1 tablet at bedtime was started.  Escitalopram 5mg  was decreased from 10mg  daily to 5 mg daily. Lexapro 1/2 tablet every evening was started.  Patient to return in 3 months.  07/26/2020 [facility] visit for observation of suspected exposure to biological agents ruled out. Contact with and exposure to Covid 19 and hypertension. No further instructions noted.  07/04/2020 (Urology) Patient was seen for a poor urinary stream. No medication changes noted.  06/05/2020 07/06/2020 NP (Infectious Diseases) Patient was seen for grief due to the loss of a child and other issues. Amlodipine increased from 5mg  daily to 10mg  daily. Discontinued Fluconazole, Poymyxin B Trimethoprim, Tamsulosin and Trazodone.  Patient to follow up with televisit in 1 month.  05/27/20 06/07/2020 [facility] ( Occupational Medicine) - seen for for drug screening for employment.    Hospital visits:  Medication Reconciliation was completed by comparing discharge summary, patient's EMR and Pharmacy list, and upon discussion with patient.  Patient visited [facility]  on 08/26/2020 for Viral upper respiratory infection.  New?Medications Started at [facility] Discharge:?? -started Fluticasone 70mcg/act 2 sprays in each nostril daily. Patient to return in a few days if no improvement.  Medication Changes  at Discharge: -Changed None  Medications Discontinued at Discharge: -Stopped None  Medications that remain the same after Discharge:??  -All other medications will remain the same.    Medications: Outpatient Encounter Medications as of 11/29/2020  Medication Sig   acetaminophen (TYLENOL) 500 MG tablet Take 2 tablets (1,000 mg total) by mouth every 8 (eight) hours as needed.   amLODipine (NORVASC) 10 MG tablet TAKE 1 TABLET BY MOUTH DAILY   escitalopram (LEXAPRO) 5 MG tablet TAKE 1 TABLET BY MOUTH ONCE A DAY   fluticasone (FLONASE) 50 MCG/ACT nasal spray Place 2 sprays into both nostrils daily.   hydrOXYzine (ATARAX/VISTARIL) 25 MG tablet Take 1 tablet (25 mg total) by mouth 3 (three) times daily as needed for anxiety.   ibuprofen (ADVIL) 600 MG tablet Take 1 tablet (600 mg total) by mouth every 6 (six) hours as needed.   lisinopril-hydrochlorothiazide (ZESTORETIC) 20-25 MG tablet Take 1 tablet by mouth daily.   traZODone (DESYREL) 50 MG tablet TAKE 1 TABLET BY MOUTH ONCE A DAY AT BEDTIME   TRIUMEQ 600-50-300 MG tablet TAKE ONE Tablet BY MOUTH ONCE A DAY   No facility-administered encounter medications on file as of 11/29/2020.   Fill History: Triumeq 600 mg-50 mg-300 mg tablet 11/12/2020 30   escitalopram 5 mg tablet 11/12/2020 30   FLUTICASONE 45m NASAL SP (120) RX 08/26/2020 30   lisinopril 20 mg-hydrochlorothiazide 25 mg tablet 11/12/2020 30   tamsulosin 0.4 mg capsule 11/12/2020 30   amlodipine 10 mg tablet 11/12/2020 30   trazodone 50 mg tablet 11/12/2020 30   Initial Questions: Have you seen any other providers since your last visit?   Any changes in your medications or health?  Any side effects from any medications?   Do you have an symptoms or problems not managed by your medications?   Any concerns about your health right now?   Has your provider asked that you check blood pressure, blood sugar, or follow special diet at home?   Do you get any type of  exercise on a regular basis?   Can you think of a goal you would like to reach for your health?   Do you have any problems getting your medications?   Is there anything that you would like to discuss during the appointment?    Please bring medications and supplements to appointment.  Unsuccessful at reaching patient.  Care Gaps:  AWV - completed 03/26/2020 Zoster vaccine - never done Covid 19 vaccine booster 2 - overdue as of 07/17/2019 Flu vaccine - due  Star Rating Drugs:  Lisinopril-hydrochlorothiazide 20/25mg  - last filled 11/12/2020 30DS at [facility] in [facility]  Clinical Pharmacist Assistant [phone]

## 2020-12-03 NOTE — Telephone Encounter (Cosign Needed)
2nd attempt

## 2020-12-04 ENCOUNTER — Telehealth: Payer: Self-pay | Admitting: Pharmacist

## 2020-12-04 ENCOUNTER — Telehealth: Payer: Medicare Other

## 2020-12-04 NOTE — Telephone Encounter (Signed)
  Chronic Care Management   Outreach Note  12/04/2020 Name: Jamie Clark MRN: [id number] DOB: [birth year]  Referred by: Shirline Frees, NP  Patient had a phone appointment scheduled with clinical pharmacist today.  An unsuccessful telephone outreach was attempted today. The patient was referred to the pharmacist for assistance with care management and care coordination.   If possible, a message was left to return call to: [phone] or to [facility]: [phone]   [name], PharmD, [facility] Clinical Pharmacist [facility] [place] [phone]

## 2020-12-04 NOTE — Progress Notes (Deleted)
Chronic Care Management Pharmacy Note  12/04/2020 Name:  Jamie Clark MRN:  [id number] DOB:  Nov 16, 1950  Summary: ***  Recommendations/Changes made from today's visit: ***  Plan: ***   Subjective: Jamie Clark is an 70 y.o. year old male who is a primary patient of Dorothyann Peng, NP.  The CCM team was consulted for assistance with disease management and care coordination needs.    Engaged with patient by telephone for initial visit in response to provider referral for pharmacy case management and/or care coordination services.   Consent to Services:  The patient was given the following information about Chronic Care Management services today, agreed to services, and gave verbal consent: 1. CCM service includes personalized support from designated clinical staff supervised by the primary care provider, including individualized plan of care and coordination with other care providers 2. 24/7 contact phone numbers for assistance for urgent and routine care needs. 3. Service will only be billed when office clinical staff spend 20 minutes or more in a month to coordinate care. 4. Only one practitioner may furnish and bill the service in a calendar month. 5.The patient may stop CCM services at any time (effective at the end of the month) by phone call to the office staff. 6. The patient will be responsible for cost sharing (co-pay) of up to 20% of the service fee (after annual deductible is met). Patient agreed to services and consent obtained.  Patient Care Team: Dorothyann Peng, NP as PCP - General (Family Medicine) Viona Gilmore, [facility] as Pharmacist (Pharmacist)  Recent office visits: 12/14/19 Dorothyann Peng, NP: Patient presented for annual exam. Restarted tamsulosin daily. Administered Tdap.  Recent consult visits: 07/31/2020 Janene Madeira NP  Patient was seen for Anxiety and other issues. Trazodone 3m 1 tablet at bedtime was started.  Escitalopram 551mwas decreased  from 1027maily to 5 mg daily. Lexapro 1/2 tablet every evening was started.  Patient to return in 3 months.   07/26/2020 [facility] visit for observation of suspected exposure to biological agents ruled out. Contact with and exposure to Covid 19 and hypertension. No further instructions noted.   07/04/2020 MatRexene Albertsrology) Patient was seen for a poor urinary stream. No medication changes noted.   06/05/2020 SteJanene Madeira (Infectious Diseases) Patient was seen for grief due to the loss of a child and other issues. Amlodipine increased from 5mg48mily to 10mg48mly. Discontinued Fluconazole, Poymyxin B Trimethoprim, Tamsulosin and Trazodone.  Patient to follow up with televisit in 1 month.   05/27/20 SusanManuela Schwartzur RT ( Occupational Medicine) - seen for for drug screening for employment.   Hospital visits: Medication Reconciliation was completed by comparing discharge summary, patient's EMR and Pharmacy list, and upon discussion with patient.   [facility] 08[facility]26/2020 for Viral upper respiratory infection.   [facility] -started Fluticasone 50mcg10m 2 sprays in each nostril daily. Patient to return in a few days if no improvement.   Medication Changes at Discharge: -Changed None   Medications Discontinued at Discharge: -Stopped None   Medications that remain the same after Discharge:??  -All other medications will remain the same.   Objective:  Lab Results  Component Value Date   CREATININE 1.23 12/14/2019   BUN 14 12/14/2019   GFRNONAA 60 12/14/2019   GFRAA 70 12/14/2019   NA 138 12/14/2019   K 3.8 12/14/2019   CALCIUM 9.3 12/14/2019   CO2 25 12/14/2019   GLUCOSE 102 (H) 12/14/2019  Lab Results  Component Value Date/Time   HGBA1C 5.7 (H) 12/14/2019 08:23 AM   HGBA1C 5.7 (H) 08/22/2018 01:33 PM    Last diabetic Eye exam: No results found for: HMDIABEYEEXA  Last diabetic Foot exam: No results  found for: HMDIABFOOTEX   Lab Results  Component Value Date   CHOL 216 (H) 12/14/2019   HDL 34 (L) 12/14/2019   LDLCALC 159 (H) 12/14/2019   TRIG 113 12/14/2019   CHOLHDL 6.4 (H) 12/14/2019    Hepatic Function Latest Ref Rng & Units 12/14/2019 08/30/2019 04/27/2018  Total Protein 6.1 - 8.1 g/dL 7.7 7.8 7.9  Albumin 3.5 - 5.0 g/dL - - -  AST 10 - 35 U/L _0 ALT 9 - 46 U/L _1 Alk Phosphatase 38 - 126 U/L - - -  Total Bilirubin 0.2 - 1.2 mg/dL 0.8 0.6 0.4  Bilirubin, Direct 0.0 - 0.3 mg/dL - - -    Lab Results  Component Value Date/Time   TSH 1.55 12/14/2019 08:23 AM   TSH 1.066 05/25/2007 10:17 PM    CBC Latest Ref Rng & Units 12/14/2019 08/30/2019 04/27/2018  WBC 3.8 - 10.8 Thousand/uL 4.1 4.5 4.6  Hemoglobin 13.2 - 17.1 g/dL 14.2 14.5 14.3  Hematocrit 38.5 - 50.0 % 39.8 42.3 41.4  Platelets 140 - 400 Thousand/uL 264 288 264    No results found for: VD25OH  Clinical ASCVD: {YES/NO:21197} The 10-year ASCVD risk score (Arnett DK, et al., 2019) is: 41.9%   Values used to calculate the score:     Age: 53 years     Sex: Male     Is Non-Hispanic African American: Yes     Diabetic: No     Tobacco smoker: Yes     Systolic Blood Pressure: 222 mmHg     Is BP treated: Yes     HDL Cholesterol: 34 mg/dL     Total Cholesterol: 216 mg/dL    Depression screen [facility] 2/9 07/02/2020 03/26/2020 03/05/2020  Decreased Interest 0 1 0  Down, Depressed, Hopeless 0 0 0  PHQ - 2 Score 0 1 0  Altered sleeping - 0 -  Tired, decreased energy - 0 -  Change in appetite - 0 -  Feeling bad or failure about yourself  - 0 -  Trouble concentrating - 0 -  Moving slowly or fidgety/restless - 0 -  Suicidal thoughts - 0 -  PHQ-9 Score - 1 -  Difficult doing work/chores - Not difficult at all -  Some recent data might be hidden     ***Other: (CHADS2VASc if Afib, MMRC or CAT for COPD, ACT, DEXA)  Social History   Tobacco Use  Smoking Status Light Smoker   Packs/day: 0.15   Types: Cigarettes   Smokeless Tobacco Never  Tobacco Comments   3 per day   BP Readings from Last 3 Encounters:  08/26/20 (!) 152/93  06/05/20 (!) 172/81  04/04/20 (!) 168/98   Pulse Readings from Last 3 Encounters:  08/26/20 75  06/05/20 88  04/04/20 80   Wt Readings from Last 3 Encounters:  06/05/20 177 lb (80.3 kg)  03/05/20 177 lb (80.3 kg)  03/01/20 175 lb (79.4 kg)   BMI Readings from Last 3 Encounters:  06/05/20 27.72 kg/m  03/05/20 27.72 kg/m  03/01/20 27.00 kg/m    Assessment/Interventions: Review of patient past medical history, allergies, medications, health status, including review of consultants reports, laboratory and other test data, was performed as part of comprehensive evaluation  and provision of chronic care management services.   SDOH:  (Social Determinants of Health) assessments and interventions performed: {yes/no:20286}  SDOH Screenings   Alcohol Screen: Low Risk    Last Alcohol Screening Score (AUDIT): 0  Depression (PHQ2-9): Low Risk    PHQ-2 Score: 0  Financial Resource Strain: Low Risk    Difficulty of Paying Living Expenses: Not hard at all  Food Insecurity: No Food Insecurity   Worried About Charity fundraiser in the Last Year: Never true   Ran Out of Food in the Last Year: Never true  Housing: Low Risk    Last Housing Risk Score: 0  Physical Activity: Inactive   Days of Exercise per Week: 0 days   Minutes of Exercise per Session: 0 min  Social Connections: Moderately Integrated   Frequency of Communication with Friends and Family: More than three times a week   Frequency of Social Gatherings with Friends and Family: More than three times a week   Attends Religious Services: More than 4 times per year   Active Member of [employer] or Organizations: Yes   Attends Music therapist: More than 4 times per year   Marital Status: Never married  Stress: No Stress Concern Present   Feeling of Stress : Not at all  Tobacco Use: High Risk   Smoking  Tobacco Use: Light Smoker   Smokeless Tobacco Use: Never  Transportation Needs: No Transportation Needs   Lack of Transportation (Medical): No   Lack of Transportation (Non-Medical): No    CCM Care Plan  No Known Allergies  Medications Reviewed Today     Reviewed by Milford Cage, RMA (Registered Medical Assistant) on 08/26/20 at 1410  Med List Status: <None>   Medication Order Taking? Sig Documenting Provider Last Dose Status Informant  acetaminophen (TYLENOL) 500 MG tablet [id number]  Take 2 tablets (1,000 mg total) by mouth every 8 (eight) hours as needed. Darr, Edison Nasuti, PA-C  Active   amLODipine (NORVASC) 10 MG tablet [id number]  Take 1 tablet (10 mg total) by mouth daily. [place] Callas, NP  Active   escitalopram (LEXAPRO) 5 MG tablet [id number]  Take 1 tablet (5 mg total) by mouth daily. [place] Callas, NP  Active   hydrOXYzine (ATARAX/VISTARIL) 25 MG tablet [id number]  Take 1 tablet (25 mg total) by mouth 3 (three) times daily as needed for anxiety. [place] Callas, NP  Active   ibuprofen (ADVIL) 600 MG tablet [id number]  Take 1 tablet (600 mg total) by mouth every 6 (six) hours as needed. Raylene Everts, MD  Active   lisinopril-hydrochlorothiazide (ZESTORETIC) 20-25 MG tablet [id number]  Take 1 tablet by mouth daily. [place] Callas, NP  Active   traZODone (DESYREL) 50 MG tablet [id number]  Take 1 tablet (50 mg total) by mouth at bedtime. [place] Callas, NP  Active   TRIUMEQ 600-50-300 MG tablet [id number]  TAKE ONE Tablet BY MOUTH ONCE A DAY [place] Callas, NP  Active             Patient Active Problem List   Diagnosis Date Noted   Insomnia 04/24/2020   Grief at loss of child 07/04/2019   Anxiety 12/22/2018   Prostatic hypertrophy 12/22/2018   Prediabetes 08/22/2018   Screen for STD (sexually transmitted disease) 08/22/2018   Dyslipidemia 01/17/2009   Essential hypertension 01/17/2009   HIV infection ([place]) 12/18/2008   Hepatitis C virus infection  cured after antiviral drug therapy 07/13/2007    Immunization  History  Administered Date(s) Administered   Fluad Quad(high Dose 65+) 03/05/2020   Hepatitis B 01/17/2009, 05/02/2009, 08/21/2009   Influenza Split 03/08/2012   Influenza Whole 12/24/2009, 11/18/2010   Influenza, Seasonal, Injecte, Preservative Fre 02/09/2013   Influenza,inj,Quad PF,6+ Mos 11/22/2013, 01/15/2015, 12/12/2015, 12/24/2016, 02/01/2018, 03/01/2019   Janssen (J&J) SARS-COV-2 Vaccination 06/19/2019   PPD Test 06/16/2011   Pneumococcal Conjugate-13 09/20/2017   Pneumococcal Polysaccharide-23 12/24/2009, 03/10/2017   Tdap 12/14/2019    Conditions to be addressed/monitored:  Hypertension, Hyperlipidemia, Anxiety, BPH, and HIV, prediabetes  There are no care plans that you recently modified to display for this patient.  Current Barriers:  {pharmacybarriers:24917}  Pharmacist Clinical Goal(s):  Patient will {PHARMACYGOALCHOICES:24921} through collaboration with PharmD and provider.   Interventions: 1:1 collaboration with Dorothyann Peng, NP regarding development and update of comprehensive plan of care as evidenced by provider attestation and co-signature Inter-disciplinary care team collaboration (see longitudinal plan of care) Comprehensive medication review performed; medication list updated in electronic medical record  BP Readings from Last 3 Encounters:  08/26/20 (!) 152/93  06/05/20 (!) 172/81  04/04/20 (!) 168/98    Hypertension (BP goal <130/80) -Uncontrolled -Current treatment: Amlodipine 10 mg 1 tablet daily Lisinopril-HCTZ 20-25 mg 1 tablet daily -Medications previously tried: ***  -Current home readings: *** -Current dietary habits: *** -Current exercise habits: *** -{ACTIONS;DENIES/REPORTS:21021675::"Denies"} hypotensive/hypertensive symptoms -Educated on {CCM BP Counseling:25124} -Counseled to monitor BP at home ***, document, and provide log at future  appointments -{CCMPHARMDINTERVENTION:25122}  Lab Results  Component Value Date   CHOL 216 (H) 12/14/2019   HDL 34 (L) 12/14/2019   LDLCALC 159 (H) 12/14/2019   TRIG 113 12/14/2019   CHOLHDL 6.4 (H) 12/14/2019    Hyperlipidemia: (LDL goal < 100) -Uncontrolled -Current treatment: No medications -Medications previously tried: atorvastatin (stopped on his own for unknown reasons)  -Current dietary patterns: *** -Current exercise habits: *** -Educated on {CCM HLD Counseling:25126} -{CCMPHARMDINTERVENTION:25122}  Lab Results  Component Value Date   HGBA1C 5.7 (H) 12/14/2019     Pre-diabetes (A1c goal <6.5%) -Controlled -Current medications: No medications -Medications previously tried: none  -Current home glucose readings fasting glucose: *** post prandial glucose: *** -{ACTIONS;DENIES/REPORTS:21021675::"Denies"} hypoglycemic/hyperglycemic symptoms -Current meal patterns:  breakfast: ***  lunch: ***  dinner: *** snacks: *** drinks: *** -Current exercise: *** -Educated on {CCM DM COUNSELING:25123} -Counseled to check feet daily and get yearly eye exams -{CCMPHARMDINTERVENTION:25122}  Anxiety (Goal: ***) -{US controlled/uncontrolled:25276} -Current treatment: Escitalopram 5 mg 1 tablet daily Hydroxyzine 25 mg  1 tablet as needed for anxiety -Medications previously tried/failed: none -PHQ9: 0 -GAD7: ***  -Connected with *** for mental health support -Educated on {CCM mental health counseling:25127} -{CCMPHARMDINTERVENTION:25122} -do a gad 7  BPH (Goal: ***) -{US controlled/uncontrolled:25276} -Current treatment  No medications -Medications previously tried: tamsulosin (didn't help?  -{CCMPHARMDINTERVENTION:25122}  HIV (Goal: ***) -{US controlled/uncontrolled:25276} -Current treatment  Triumeq 50-300 mg 1 tablet daily -Medications previously tried: ***  -{CCMPHARMDINTERVENTION:25122}  Insomnia (Goal: ***) -{US controlled/uncontrolled:25276} -Current  treatment  Trazodone 50 mg 1 tablet at bedtime -Medications previously tried: ***  -{CCMPHARMDINTERVENTION:25122}  Tobacco use (Goal ***) -{US controlled/uncontrolled:25276} -Previous quit attempts: *** -Current treatment  No medications -Patient smokes {Time to first cigarette:23873} -Patient triggers include: {Smoking Triggers:23882} -On a scale of 1-10, reports MOTIVATION to quit is *** -On a scale of 1-10, reports CONFIDENCE in quitting is *** -{Smoking Cessation Counseling:23883} -{CCMPHARMDINTERVENTION:25122} -still smoking?    Health Maintenance -Vaccine gaps: shingrix, COVID booster, influenza -Current therapy:  Acetaminophen 500 mg 1 tablet as needed Fluticasone 50 mcg/act  as needed Ibuprofen 600 mg? Taking? -Educated on {ccm supplement counseling:25128} -{CCM Patient satisfied:25129} -{CCMPHARMDINTERVENTION:25122}  Patient Goals/Self-Care Activities Patient will:  - {pharmacypatientgoals:24919}  Follow Up Plan: {CM FOLLOW UP ZUDO:72550}    Medication Assistance: {MEDASSISTANCEINFO:25044}  Compliance/Adherence/Medication fill history: Care Gaps: Shingrix, COVID booster, influenza  Star-Rating Drugs: Lisinopril-hydrochlorothiazide 20/69m - [facility] [place] Patient's preferred [facility] is:  [facility] [place]: [phone]: [phone]- [address]Phone: [phone]: [phone] Uses pill box? {Yes or If no, why not?:20788} Pt endorses ***% compliance  We discussed: {Pharmacy options:24294} Patient decided to: {US Pharmacy PDAPT:00525} Care Plan and Follow Up Patient Decision:  {FOLLOWUP:24991}  Plan: {CM FOLLOW UP PLTGA:89022} MJeni Salles PharmD, [place] Pharmacist [place]

## 2020-12-10 ENCOUNTER — Ambulatory Visit: Payer: Medicare Other | Admitting: Infectious Diseases

## 2020-12-10 NOTE — Progress Notes (Deleted)
Patient ID: Jamie Clark, male   DOB: 1950-09-11, 70 y.o.   MRN: [id number]  CC: HIV follow up care Anxiety / depression Poor sleep - this is better  Blood pressure meds - wondering if he is on good enough regimen.     HPI ***  Missed visit with [facility] on 9/14.     Review of Systems    Outpatient Encounter Medications as of 12/10/2020  Medication Sig   acetaminophen (TYLENOL) 500 MG tablet Take 2 tablets (1,000 mg total) by mouth every 8 (eight) hours as needed.   amLODipine (NORVASC) 10 MG tablet TAKE 1 TABLET BY MOUTH DAILY   escitalopram (LEXAPRO) 5 MG tablet TAKE 1 TABLET BY MOUTH ONCE A DAY   fluticasone (FLONASE) 50 MCG/ACT nasal spray Place 2 sprays into both nostrils daily.   hydrOXYzine (ATARAX/VISTARIL) 25 MG tablet Take 1 tablet (25 mg total) by mouth 3 (three) times daily as needed for anxiety.   ibuprofen (ADVIL) 600 MG tablet Take 1 tablet (600 mg total) by mouth every 6 (six) hours as needed.   lisinopril-hydrochlorothiazide (ZESTORETIC) 20-25 MG tablet Take 1 tablet by mouth daily.   traZODone (DESYREL) 50 MG tablet TAKE 1 TABLET BY MOUTH ONCE A DAY AT BEDTIME   TRIUMEQ 600-50-300 MG tablet TAKE ONE Tablet BY MOUTH ONCE A DAY   No facility-administered encounter medications on file as of 12/10/2020.     Patient Active Problem List   Diagnosis Date Noted   Insomnia 04/24/2020   Grief at loss of child 07/04/2019   Anxiety 12/22/2018   Prostatic hypertrophy 12/22/2018   Prediabetes 08/22/2018   Screen for STD (sexually transmitted disease) 08/22/2018   Dyslipidemia 01/17/2009   Essential hypertension 01/17/2009   HIV infection ([facility]) 12/18/2008   Hepatitis C virus infection cured after antiviral drug therapy 07/13/2007     Health Maintenance Due  Topic Date Due   Zoster Vaccines- Shingrix (1 of 2) Never done   COVID-19 Vaccine (2 - Janssen risk series) 07/17/2019   INFLUENZA VACCINE  10/21/2020     Physical Exam  There  were no vitals taken for this visit.   BP Readings from Last 3 Encounters:  08/26/20 (!) 152/93  06/05/20 (!) 172/81  04/04/20 (!) 168/98     Physical Exam Constitutional:      Appearance: He is well-developed.     Comments: Seated comfortably in chair during visit.   HENT:     Mouth/Throat:     Dentition: Normal dentition. No dental abscesses.  Cardiovascular:     Rate and Rhythm: Normal rate and regular rhythm.     Heart sounds: Normal heart sounds.  Pulmonary:     Effort: Pulmonary effort is normal.     Breath sounds: Normal breath sounds.  Abdominal:     General: There is no distension.     Palpations: Abdomen is soft.     Tenderness: There is no abdominal tenderness.  Lymphadenopathy:     Cervical: No cervical adenopathy.  Skin:    General: Skin is warm and dry.     Findings: No rash.  Neurological:     Mental Status: He is alert and oriented to person, place, and time.  Psychiatric:        Judgment: Judgment normal.     Comments: In good spirits today and engaged in care discussion.      Lab Results  Component Value Date   CD4TCELL 46 03/05/2020   Lab Results  Component  Value Date   CD4TABS 905 03/05/2020   CD4TABS 949 08/30/2019   CD4TABS 849 03/01/2019   Lab Results  Component Value Date   HIV1RNAQUANT <20 03/05/2020   Lab Results  Component Value Date   HEPBSAB NEG 05/06/2010   Lab Results  Component Value Date   LABRPR NON-REACTIVE 08/30/2019    CBC Lab Results  Component Value Date   WBC 4.1 12/14/2019   RBC 4.01 (L) 12/14/2019   HGB 14.2 12/14/2019   HCT 39.8 12/14/2019   PLT 264 12/14/2019   MCV 99.3 12/14/2019   MCH 35.4 (H) 12/14/2019   MCHC 35.7 12/14/2019   RDW 14.3 12/14/2019   LYMPHSABS 1,812 12/14/2019   MONOABS 0.3 09/14/2017   EOSABS 139 12/14/2019    BMET Lab Results  Component Value Date   NA 138 12/14/2019   K 3.8 12/14/2019   CL 105 12/14/2019   CO2 25 12/14/2019   GLUCOSE 102 (H) 12/14/2019   BUN 14  12/14/2019   CREATININE 1.23 12/14/2019   CALCIUM 9.3 12/14/2019   GFRNONAA 60 12/14/2019   GFRAA 70 12/14/2019    Assessment and Plan Problem List Items Addressed This Visit   None   Rexene Alberts, MSN, NP-C [facility]  [email or web address] Pager: [phone] Office: [phone] [facility] Main Line: [phone]

## 2020-12-12 ENCOUNTER — Ambulatory Visit: Payer: Medicare Other | Admitting: Infectious Diseases

## 2020-12-19 ENCOUNTER — Encounter: Payer: Self-pay | Admitting: Infectious Diseases

## 2020-12-19 ENCOUNTER — Other Ambulatory Visit: Payer: Self-pay

## 2020-12-19 ENCOUNTER — Ambulatory Visit (INDEPENDENT_AMBULATORY_CARE_PROVIDER_SITE_OTHER): Payer: Medicare Other | Admitting: Infectious Diseases

## 2020-12-19 VITALS — BP 175/98 | HR 77 | Temp 97.7°F | Wt 177.0 lb

## 2020-12-19 DIAGNOSIS — N400 Enlarged prostate without lower urinary tract symptoms: Secondary | ICD-10-CM

## 2020-12-19 DIAGNOSIS — I10 Essential (primary) hypertension: Secondary | ICD-10-CM | POA: Diagnosis not present

## 2020-12-19 DIAGNOSIS — F4321 Adjustment disorder with depressed mood: Secondary | ICD-10-CM | POA: Diagnosis not present

## 2020-12-19 DIAGNOSIS — Z634 Disappearance and death of family member: Secondary | ICD-10-CM

## 2020-12-19 DIAGNOSIS — Z23 Encounter for immunization: Secondary | ICD-10-CM | POA: Diagnosis not present

## 2020-12-19 DIAGNOSIS — B20 Human immunodeficiency virus [HIV] disease: Secondary | ICD-10-CM | POA: Diagnosis not present

## 2020-12-19 MED ORDER — TRIUMEQ 600-50-300 MG PO TABS: 1.0000 | ORAL_TABLET | Freq: Every day | ORAL | 5 refills | Status: DC

## 2020-12-19 NOTE — Assessment & Plan Note (Addendum)
Well controlled on Triumeq once daily. Will update VL, CD4, CMP, CBC and lipids today.  Encouraged him to see his PCP team for other chronic conditions.  Flu vaccine today.  No dental needs identified.  Return in about 6 months (around 06/18/2021).

## 2020-12-19 NOTE — Assessment & Plan Note (Signed)
Did not tolerate SSRIs but fortunately he has been working with grief counselor and finds joy/meaning in work and his colleagues/friends which has been most helpful for him. He seems the most at peace in a long time.

## 2020-12-19 NOTE — Assessment & Plan Note (Signed)
Flomax does not seem to be working. He has been following with urology for other scrotal pain but wants to talk with his PCP about this first to see if other medications are recommended.

## 2020-12-19 NOTE — Patient Instructions (Addendum)
Always nice to see you, Jamie Clark.   No changes in your medications today.   Please stop by the lab on your way out and we will see you in 6 months! We can do labs prior to your visit then if you prefer.   Your primary care team is trying to reach you for an appointment - they need to see you to help provide the best recommendations for your blood pressure and preventative care.  *Please return call to Jamie Clark, pharmacist @ [phone] or to [facility] main office [phone]  Please talk with [facility] about your blood pressure and your urine problems.

## 2020-12-19 NOTE — Assessment & Plan Note (Signed)
Uncontrolled today - taking zestoric but not amlodipine.  I asked him to please talk about his BP with PCP - hopefully for some help to optimize his regimen. Maybe a single tablet regimen that has 3 mechanisms of action will help him as he often only takes one.

## 2020-12-19 NOTE — Progress Notes (Signed)
Patient ID: Jamie Clark, male   DOB: June 07, 1950, 70 y.o.   MRN: [id number]   Chief Complaint  Patient presents with   Follow-up    B20      HPI Taking Triumeq everyday without any missed doses.  Worried about blood pressure - high today despite taking his lisinopril-HCTZ. He is not taking the amlodipine.  Would like flu shot today.  Urinating a lot at night and flomax is not helping - feels like he has to "push it out." He takes his BP medication in the mornings before he eats before he goes to work.  Mood is much better - he does not like the trazodone or lexapro as they made him feel woozy. He states he most benefits from grief counseling and work.    Review of Systems  Constitutional:  Negative for chills and fever.  HENT:  Negative for sore throat.        No dental problems  Respiratory:  Negative for cough.   Cardiovascular:  Negative for chest pain and leg swelling.  Gastrointestinal:  Negative for abdominal pain, diarrhea and vomiting.  Genitourinary:  Positive for urgency. Negative for dysuria and flank pain.  Musculoskeletal:  Negative for myalgias and neck pain.  Skin:  Negative for rash.  Neurological:  Negative for dizziness and headaches.  Psychiatric/Behavioral:  The patient is not nervous/anxious.   All other systems reviewed and are negative.    Outpatient Encounter Medications as of 12/19/2020  Medication Sig   acetaminophen (TYLENOL) 500 MG tablet Take 2 tablets (1,000 mg total) by mouth every 8 (eight) hours as needed.   amLODipine (NORVASC) 10 MG tablet TAKE 1 TABLET BY MOUTH DAILY   hydrOXYzine (ATARAX/VISTARIL) 25 MG tablet Take 1 tablet (25 mg total) by mouth 3 (three) times daily as needed for anxiety.   ibuprofen (ADVIL) 600 MG tablet Take 1 tablet (600 mg total) by mouth every 6 (six) hours as needed.   lisinopril-hydrochlorothiazide (ZESTORETIC) 20-25 MG tablet Take 1 tablet by mouth daily.   tamsulosin (FLOMAX) 0.4 MG CAPS capsule Take  0.8 mg by mouth at bedtime.   [DISCONTINUED] TRIUMEQ 600-50-300 MG tablet TAKE ONE Tablet BY MOUTH ONCE A DAY   abacavir-dolutegravir-lamiVUDine (TRIUMEQ) 600-50-300 MG tablet Take 1 tablet by mouth daily.   fluticasone (FLONASE) 50 MCG/ACT nasal spray Place 2 sprays into both nostrils daily. (Patient not taking: Reported on 12/19/2020)   [DISCONTINUED] escitalopram (LEXAPRO) 5 MG tablet TAKE 1 TABLET BY MOUTH ONCE A DAY (Patient not taking: Reported on 12/19/2020)   [DISCONTINUED] traZODone (DESYREL) 50 MG tablet TAKE 1 TABLET BY MOUTH ONCE A DAY AT BEDTIME (Patient not taking: Reported on 12/19/2020)   No facility-administered encounter medications on file as of 12/19/2020.     Patient Active Problem List   Diagnosis Date Noted   Insomnia 04/24/2020   Grief at loss of child 07/04/2019   Anxiety 12/22/2018   Prostatic hypertrophy 12/22/2018   Prediabetes 08/22/2018   Screen for STD (sexually transmitted disease) 08/22/2018   Dyslipidemia 01/17/2009   Essential hypertension 01/17/2009   HIV infection (HCC) 12/18/2008   Hepatitis C virus infection cured after antiviral drug therapy 07/13/2007     Health Maintenance Due  Topic Date Due   Zoster Vaccines- Shingrix (1 of 2) Never done   COVID-19 Vaccine (2 - Janssen risk series) 07/17/2019     Physical Exam  BP (!) 175/98   Pulse 77   Temp 97.7 F (36.5 C) (Oral)   Wt  177 lb (80.3 kg)   SpO2 96%   BMI 27.72 kg/m     Physical Exam Pulmonary:     Effort: Pulmonary effort is normal.     Comments: No shortness of breath detected in conversation.  Neurological:     Mental Status: He is oriented to person, place, and time.  Psychiatric:        Mood and Affect: Mood normal.        Behavior: Behavior normal.        Thought Content: Thought content normal.        Judgment: Judgment normal.     Lab Results  Component Value Date   CD4TCELL 46 03/05/2020   Lab Results  Component Value Date   CD4TABS 905 03/05/2020   CD4TABS  949 08/30/2019   CD4TABS 849 03/01/2019   Lab Results  Component Value Date   HIV1RNAQUANT <20 03/05/2020   Lab Results  Component Value Date   HEPBSAB NEG 05/06/2010   Lab Results  Component Value Date   LABRPR NON-REACTIVE 08/30/2019    CBC Lab Results  Component Value Date   WBC 4.1 12/14/2019   RBC 4.01 (L) 12/14/2019   HGB 14.2 12/14/2019   HCT 39.8 12/14/2019   PLT 264 12/14/2019   MCV 99.3 12/14/2019   MCH 35.4 (H) 12/14/2019   MCHC 35.7 12/14/2019   RDW 14.3 12/14/2019   LYMPHSABS 1,812 12/14/2019   MONOABS 0.3 09/14/2017   EOSABS 139 12/14/2019    BMET Lab Results  Component Value Date   NA 138 12/14/2019   K 3.8 12/14/2019   CL 105 12/14/2019   CO2 25 12/14/2019   GLUCOSE 102 (H) 12/14/2019   BUN 14 12/14/2019   CREATININE 1.23 12/14/2019   CALCIUM 9.3 12/14/2019   GFRNONAA 60 12/14/2019   GFRAA 70 12/14/2019    Assessment and Plan Problem List Items Addressed This Visit       Unprioritized   HIV infection (HCC) - Primary (Chronic)    Well controlled on Triumeq once daily. Will update VL, CD4, CMP, CBC and lipids today.  Encouraged him to see his PCP team for other chronic conditions.  Flu vaccine today.  No dental needs identified.  Return in about 6 months (around 06/18/2021).       Relevant Medications   abacavir-dolutegravir-lamiVUDine (TRIUMEQ) 600-50-300 MG tablet   Other Relevant Orders   HIV-1 RNA quant-no reflex-bld   T-helper cell (CD4)- ([facility] clinic only)   Prostatic hypertrophy    Flomax does not seem to be working. He has been following with urology for other scrotal pain but wants to talk with his PCP about this first to see if other medications are recommended.       Grief at loss of child    Did not tolerate SSRIs but fortunately he has been working with grief counselor and finds joy/meaning in work and his colleagues/friends which has been most helpful for him. He seems the most at peace in a long time.        Essential hypertension    Uncontrolled today - taking zestoric but not amlodipine.  I asked him to please talk about his BP with PCP - hopefully for some help to optimize his regimen. Maybe a single tablet regimen that has 3 mechanisms of action will help him as he often only takes one.       Other Visit Diagnoses     Primary hypertension       Relevant Orders  COMPLETE METABOLIC PANEL WITH GFR   CBC with Differential/Platelet   Lipid panel   Human immunodeficiency virus (HIV) disease (HCC)       Relevant Medications   abacavir-dolutegravir-lamiVUDine (TRIUMEQ) 600-50-300 MG tablet   Need for immunization against influenza       Relevant Orders   Flu Vaccine QUAD 41mo+IM (Fluarix, Fluzone & Alfiuria Quad PF) (Completed)         Rexene Alberts, MSN, NP-C [facility]  [email or web address] Pager: [phone] Office: [phone] [facility] Main Line: [phone]

## 2020-12-20 LAB — T-HELPER CELL (CD4) - (RCID CLINIC ONLY)
CD4 % Helper T Cell: 44 % (ref 33–65)
CD4 T Cell Abs: 635 /uL (ref 400–1790)

## 2020-12-22 LAB — COMPLETE METABOLIC PANEL WITH GFR
AG Ratio: 1.1 (calc) (ref 1.0–2.5)
ALT: 15 U/L (ref 9–46)
AST: 18 U/L (ref 10–35)
Albumin: 3.9 g/dL (ref 3.6–5.1)
Alkaline phosphatase (APISO): 95 U/L (ref 35–144)
BUN: 13 mg/dL (ref 7–25)
CO2: 25 mmol/L (ref 20–32)
Calcium: 9.3 mg/dL (ref 8.6–10.3)
Chloride: 107 mmol/L (ref 98–110)
Creat: 0.98 mg/dL (ref 0.70–1.35)
Globulin: 3.6 g/dL (calc) (ref 1.9–3.7)
Glucose, Bld: 101 mg/dL — ABNORMAL HIGH (ref 65–99)
Potassium: 3.8 mmol/L (ref 3.5–5.3)
Sodium: 139 mmol/L (ref 135–146)
Total Bilirubin: 0.6 mg/dL (ref 0.2–1.2)
Total Protein: 7.5 g/dL (ref 6.1–8.1)
eGFR: 84 mL/min/{1.73_m2} (ref >=60)

## 2020-12-22 LAB — CBC WITH DIFFERENTIAL/PLATELET
Absolute Monocytes: 316 cells/uL (ref 200–950)
Basophils Absolute: 20 cells/uL (ref 0–200)
Basophils Relative: 0.5 %
Eosinophils Absolute: 148 cells/uL (ref 15–500)
Eosinophils Relative: 3.8 %
HCT: 37.4 % — ABNORMAL LOW (ref 38.5–50.0)
Hemoglobin: 13.5 g/dL (ref 13.2–17.1)
Lymphs Abs: 1622 cells/uL (ref 850–3900)
MCH: 37.6 pg — ABNORMAL HIGH (ref 27.0–33.0)
MCHC: 36.1 g/dL — ABNORMAL HIGH (ref 32.0–36.0)
MCV: 104.2 fL — ABNORMAL HIGH (ref 80.0–100.0)
MPV: 9.8 fL (ref 7.5–12.5)
Monocytes Relative: 8.1 %
Neutro Abs: 1794 cells/uL (ref 1500–7800)
Neutrophils Relative %: 46 %
Platelets: 280 10*3/uL (ref 140–400)
RBC: 3.59 10*6/uL — ABNORMAL LOW (ref 4.20–5.80)
RDW: 15.5 % — ABNORMAL HIGH (ref 11.0–15.0)
Total Lymphocyte: 41.6 %
WBC: 3.9 10*3/uL (ref 3.8–10.8)

## 2020-12-22 LAB — LIPID PANEL
Cholesterol: 192 mg/dL (ref <200)
HDL: 35 mg/dL — ABNORMAL LOW (ref >=40)
LDL Cholesterol (Calc): 133 mg/dL (calc) — ABNORMAL HIGH
Non-HDL Cholesterol (Calc): 157 mg/dL (calc) — ABNORMAL HIGH (ref <130)
Total CHOL/HDL Ratio: 5.5 (calc) — ABNORMAL HIGH (ref <5.0)
Triglycerides: 126 mg/dL (ref <150)

## 2020-12-22 LAB — HIV-1 RNA QUANT-NO REFLEX-BLD
HIV 1 RNA Quant: 59 Copies/mL — ABNORMAL HIGH
HIV-1 RNA Quant, Log: 1.77 Log cps/mL — ABNORMAL HIGH

## 2021-01-06 ENCOUNTER — Other Ambulatory Visit: Payer: Self-pay

## 2021-01-07 ENCOUNTER — Encounter: Payer: Self-pay | Admitting: Adult Health

## 2021-01-07 ENCOUNTER — Ambulatory Visit (INDEPENDENT_AMBULATORY_CARE_PROVIDER_SITE_OTHER): Payer: Medicare Other | Admitting: Adult Health

## 2021-01-07 VITALS — BP 180/84 | HR 72 | Temp 98.5°F | Ht 67.0 in | Wt 177.0 lb

## 2021-01-07 DIAGNOSIS — F419 Anxiety disorder, unspecified: Secondary | ICD-10-CM

## 2021-01-07 DIAGNOSIS — N400 Enlarged prostate without lower urinary tract symptoms: Secondary | ICD-10-CM

## 2021-01-07 DIAGNOSIS — F32A Depression, unspecified: Secondary | ICD-10-CM

## 2021-01-07 DIAGNOSIS — I10 Essential (primary) hypertension: Secondary | ICD-10-CM | POA: Diagnosis not present

## 2021-01-07 DIAGNOSIS — R7309 Other abnormal glucose: Secondary | ICD-10-CM

## 2021-01-07 LAB — POCT GLYCOSYLATED HEMOGLOBIN (HGB A1C): Hemoglobin A1C: 5.4 % (ref 4.0–5.6)

## 2021-01-07 MED ORDER — OLMESARTAN-AMLODIPINE-HCTZ 40-10-25 MG PO TABS: 1.0000 | ORAL_TABLET | Freq: Every day | ORAL | 1 refills | Status: DC

## 2021-01-07 MED ORDER — BUPROPION HCL ER (XL) 150 MG PO TB24: 150.0000 mg | ORAL_TABLET | Freq: Every day | ORAL | 0 refills | Status: DC

## 2021-01-07 NOTE — Patient Instructions (Addendum)
It was great seeing you today   I am going to prescribe a new medication called Tribenzor - this is three medications in one. You can stop your current medications   I will see you back in 2 weeks   Benifiber or Metamucil   Your A1c was 5.4

## 2021-01-07 NOTE — Progress Notes (Signed)
Subjective:    Patient ID: Jamie Clark, male    DOB: Oct 27, 1950, 70 y.o.   MRN: [id number]  HPI 70 year old male who  has a past medical history of Allergy, Anxiety, Depression, Hepatitis C, chronic (HCC), HIV positive (HCC), and Hypertension.  He presents to the office today for follow up regarding multiple issues.  He was seen by ID recently and they were concerned about his BP and glucose levels.   He had been taking lisinopril/hctz 20-25 mg daily but not norvasc 10 mg. He does report that he has started taking both medications as directed. Denies headaches but does have blurred vision. Did not take his medications prior to his appointment today  . BP Readings from Last 3 Encounters:  01/07/21 (!) 180/84  12/19/20 (!) 175/98  08/26/20 (!) 152/93   Anxiety/Depression - in the past he has been tried on SSRIs but did not like the way they made him feel ( woozy). Anxiety/Depression stems from untimely death of his son. He feels " on edge all the time" and would like to try something else that is a little more mild.   BPH with nocturia - has been seen by Urology and has biopsies done ( benign), prescribed Flomax 0.8 mg QHS. Reports he is getting up multiple times a night   Review of Systems See HPI   Past Medical History:  Diagnosis Date   Allergy    Anxiety    Depression    Hepatitis C, chronic (HCC)    HIV positive (HCC)    Hypertension     Social History   Socioeconomic History   Marital status: Single    Spouse name: Not on file   Number of children: Not on file   Years of education: Not on file   Highest education level: Not on file  Occupational History   Not on file  Tobacco Use   Smoking status: Light Smoker    Packs/day: 0.15    Types: Cigarettes   Smokeless tobacco: Never   Tobacco comments:    3 per day  Vaping Use   Vaping Use: Never used  Substance and Sexual Activity   Alcohol use: No    Alcohol/week: 0.0 standard drinks   Drug use: No    Sexual activity: Never    Partners: Female    Birth control/protection: Condom    Comment: given condoms 05/2020  Other Topics Concern   Not on file  Social History Narrative   Not on file   Social Determinants of Health   Financial Resource Strain: Low Risk    Difficulty of Paying Living Expenses: Not hard at all  Food Insecurity: No Food Insecurity   Worried About Programme researcher, broadcasting/film/video in the Last Year: Never true   Ran Out of Food in the Last Year: Never true  Transportation Needs: No Transportation Needs   Lack of Transportation (Medical): No   Lack of Transportation (Non-Medical): No  Physical Activity: Inactive   Days of Exercise per Week: 0 days   Minutes of Exercise per Session: 0 min  Stress: No Stress Concern Present   Feeling of Stress : Not at all  Social Connections: Moderately Integrated   Frequency of Communication with Friends and Family: More than three times a week   Frequency of Social Gatherings with Friends and Family: More than three times a week   Attends Religious Services: More than 4 times per year   Active Member of [employer] or Organizations:  Yes   Attends Banker Meetings: More than 4 times per year   Marital Status: Never married  Intimate Partner Violence: Not At Risk   Fear of Current or Ex-Partner: No   Emotionally Abused: No   Physically Abused: No   Sexually Abused: No    Past Surgical History:  Procedure Laterality Date   NO PAST SURGERIES      Family History  Problem Relation Age of Onset   Cancer Mother    Hypertension Mother    Esophageal cancer Mother    Cancer Father    Hypertension Father    Colon cancer Neg Hx    Colon polyps Neg Hx    Rectal cancer Neg Hx    Stomach cancer Neg Hx     No Known Allergies  Current Outpatient Medications on File Prior to Visit  Medication Sig Dispense Refill   abacavir-dolutegravir-lamiVUDine (TRIUMEQ) 600-50-300 MG tablet Take 1 tablet by mouth daily. 30 tablet 5    acetaminophen (TYLENOL) 500 MG tablet Take 2 tablets (1,000 mg total) by mouth every 8 (eight) hours as needed. 30 tablet 0   fluticasone (FLONASE) 50 MCG/ACT nasal spray Place 2 sprays into both nostrils daily. 18.2 mL 0   hydrOXYzine (ATARAX/VISTARIL) 25 MG tablet Take 1 tablet (25 mg total) by mouth 3 (three) times daily as needed for anxiety. 30 tablet 0   ibuprofen (ADVIL) 600 MG tablet Take 1 tablet (600 mg total) by mouth every 6 (six) hours as needed. 30 tablet 0   tamsulosin (FLOMAX) 0.4 MG CAPS capsule Take 0.8 mg by mouth at bedtime.     No current facility-administered medications on file prior to visit.    BP (!) 180/84   Pulse 72   Temp 98.5 F (36.9 C) (Oral)   Ht 5\' 7"  (1.702 m)   Wt 177 lb (80.3 kg)   SpO2 95%   BMI 27.72 kg/m       Objective:   Physical Exam Vitals and nursing note reviewed.  Constitutional:      Appearance: Normal appearance.  Cardiovascular:     Rate and Rhythm: Normal rate and regular rhythm.     Pulses: Normal pulses.     Heart sounds: Normal heart sounds.  Pulmonary:     Effort: Pulmonary effort is normal.     Breath sounds: Normal breath sounds.  Musculoskeletal:        General: Normal range of motion.  Skin:    General: Skin is warm and dry.  Neurological:     General: No focal deficit present.     Mental Status: He is alert and oriented to person, place, and time.  Psychiatric:        Mood and Affect: Mood normal.        Behavior: Behavior normal.        Thought Content: Thought content normal.        Judgment: Judgment normal.          Assessment & Plan:  1. Elevated glucose  - POC HgB A1c- 5.4   2. Essential hypertension - Will d/c Zestoretic and Norvasc. Try him on Tribenzor for better compliance.  - Follow up in two weeks  - Olmesartan-amLODIPine-HCTZ 40-10-25 MG TABS; Take 1 tablet by mouth daily.  Dispense: 90 tablet; Refill: 1  3. Anxiety and depression - Will trial on wellbutrin  - buPROPion (WELLBUTRIN  XL) 150 MG 24 hr tablet; Take 1 tablet (150 mg total) by mouth daily.  Dispense: 90 tablet; Refill: 0  4. Prostatic hypertrophy - Have him take his Flomax in the morning   Shirline Frees, NP

## 2021-01-15 DIAGNOSIS — Z20822 Contact with and (suspected) exposure to covid-19: Secondary | ICD-10-CM | POA: Diagnosis not present

## 2021-01-15 DIAGNOSIS — B349 Viral infection, unspecified: Secondary | ICD-10-CM | POA: Diagnosis not present

## 2021-01-20 ENCOUNTER — Other Ambulatory Visit: Payer: Self-pay

## 2021-01-21 ENCOUNTER — Ambulatory Visit: Payer: Medicare Other | Admitting: Adult Health

## 2021-01-31 ENCOUNTER — Telehealth: Payer: Self-pay

## 2021-01-31 ENCOUNTER — Ambulatory Visit (INDEPENDENT_AMBULATORY_CARE_PROVIDER_SITE_OTHER): Payer: Medicare Other | Admitting: Adult Health

## 2021-01-31 ENCOUNTER — Encounter: Payer: Self-pay | Admitting: Adult Health

## 2021-01-31 VITALS — BP 160/100 | HR 75 | Temp 97.4°F | Ht 67.0 in | Wt 175.0 lb

## 2021-01-31 DIAGNOSIS — F32A Depression, unspecified: Secondary | ICD-10-CM | POA: Diagnosis not present

## 2021-01-31 DIAGNOSIS — R051 Acute cough: Secondary | ICD-10-CM

## 2021-01-31 DIAGNOSIS — I10 Essential (primary) hypertension: Secondary | ICD-10-CM | POA: Diagnosis not present

## 2021-01-31 DIAGNOSIS — F419 Anxiety disorder, unspecified: Secondary | ICD-10-CM

## 2021-01-31 MED ORDER — BENZONATATE 200 MG PO CAPS: 200.0000 mg | ORAL_CAPSULE | Freq: Two times a day (BID) | ORAL | 0 refills | Status: DC | PRN

## 2021-01-31 MED ORDER — HYDRALAZINE HCL 25 MG PO TABS: 25.0000 mg | ORAL_TABLET | Freq: Three times a day (TID) | ORAL | 0 refills | Status: DC

## 2021-01-31 NOTE — Telephone Encounter (Signed)
Patient called asking for a letter to his employer stating no restrictions and when he can return to work  fax # [phone] Jamie Clark Patient would like a call back when faxed

## 2021-01-31 NOTE — Telephone Encounter (Signed)
Ok for letter

## 2021-01-31 NOTE — Telephone Encounter (Signed)
Letter has been faxed. Called pt 2x no answer unable to leave a message.

## 2021-01-31 NOTE — Patient Instructions (Addendum)
Take the Wellbutrin at night   Take the Tribenzor in the morning   I am going to add Hydralazine to your regimen - this you have to take three times a day.   I have also sent in a Tessalon Pearls to help with your cough.

## 2021-01-31 NOTE — Progress Notes (Signed)
Subjective:    Patient ID: Jamie Clark, male    DOB: [birth year], 70 y.o.   MRN: [id number]  HPI  70 year old male who  has a past medical history of Allergy, Anxiety, Depression, Hepatitis C, chronic (HCC), HIV positive (HCC), and Hypertension.  He presents to the office today for 3 week follow up.   Hypertension-during his last visit he reported that he was taking his lisinopril/hydrochlorothiazide 20-25 mg daily but was not taking his Norvasc 10 mg.  We decided to trial him on Tribenzor for better compliance. He reports that he has been taking his medication every day and has not developed any side effects. He has been monitoring his blood pressure at home and reports that most of his blood pressure has been in the 160's/100's.   BP Readings from Last 3 Encounters:  01/31/21 (!) 160/100  01/07/21 (!) 180/84  12/19/20 (!) 175/98    Anxiety and depression.  In the past had been trialed on SSRIs but did not like the way they made him feel.  His symptoms stem from a untimely death of his son.  He reported feeling on edge all the time and during his last visit he reported that he wanted to try something else to help with his symptoms.  He was placed on Wellbutrin 150 XR mg daily. Today he reports that he took the medication for a couple of days but then stopped it due to making him fatigued. He is having crying spells at work and is snappy with his co workers and customers.   He has also developed a dry cough over he last two days. Denies fevers, chills, wheezing, or shortness of breath.   Review of Systems See HPI   Past Medical History:  Diagnosis Date   Allergy    Anxiety    Depression    Hepatitis C, chronic (HCC)    HIV positive (HCC)    Hypertension     Social History   Socioeconomic History   Marital status: Single    Spouse name: Not on file   Number of children: Not on file   Years of education: Not on file   Highest education level: Not on file  Occupational  History   Not on file  Tobacco Use   Smoking status: Light Smoker    Packs/day: 0.15    Types: Cigarettes   Smokeless tobacco: Never   Tobacco comments:    3 per day  Vaping Use   Vaping Use: Never used  Substance and Sexual Activity   Alcohol use: No    Alcohol/week: 0.0 standard drinks   Drug use: No   Sexual activity: Never    Partners: Female    Birth control/protection: Condom    Comment: given condoms 05/2020  Other Topics Concern   Not on file  Social History Narrative   Not on file   Social Determinants of Health   Financial Resource Strain: Low Risk    Difficulty of Paying Living Expenses: Not hard at all  Food Insecurity: No Food Insecurity   Worried About Programme researcher, broadcasting/film/video in the Last Year: Never true   Ran Out of Food in the Last Year: Never true  Transportation Needs: No Transportation Needs   Lack of Transportation (Medical): No   Lack of Transportation (Non-Medical): No  Physical Activity: Inactive   Days of Exercise per Week: 0 days   Minutes of Exercise per Session: 0 min  Stress: No Stress Concern Present  Feeling of Stress : Not at all  Social Connections: Moderately Integrated   Frequency of Communication with Friends and Family: More than three times a week   Frequency of Social Gatherings with Friends and Family: More than three times a week   Attends Religious Services: More than 4 times per year   Active Member of [employer] or Organizations: Yes   Attends Engineer, structural: More than 4 times per year   Marital Status: Never married  Catering manager Violence: Not At Risk   Fear of Current or Ex-Partner: No   Emotionally Abused: No   Physically Abused: No   Sexually Abused: No    Past Surgical History:  Procedure Laterality Date   NO PAST SURGERIES      Family History  Problem Relation Age of Onset   Cancer Mother    Hypertension Mother    Esophageal cancer Mother    Cancer Father    Hypertension Father    Colon cancer  Neg Hx    Colon polyps Neg Hx    Rectal cancer Neg Hx    Stomach cancer Neg Hx     No Known Allergies  Current Outpatient Medications on File Prior to Visit  Medication Sig Dispense Refill   abacavir-dolutegravir-lamiVUDine (TRIUMEQ) 600-50-300 MG tablet Take 1 tablet by mouth daily. 30 tablet 5   acetaminophen (TYLENOL) 500 MG tablet Take 2 tablets (1,000 mg total) by mouth every 8 (eight) hours as needed. 30 tablet 0   buPROPion (WELLBUTRIN XL) 150 MG 24 hr tablet Take 1 tablet (150 mg total) by mouth daily. 90 tablet 0   fluticasone (FLONASE) 50 MCG/ACT nasal spray Place 2 sprays into both nostrils daily. 18.2 mL 0   hydrOXYzine (ATARAX/VISTARIL) 25 MG tablet Take 1 tablet (25 mg total) by mouth 3 (three) times daily as needed for anxiety. 30 tablet 0   ibuprofen (ADVIL) 600 MG tablet Take 1 tablet (600 mg total) by mouth every 6 (six) hours as needed. 30 tablet 0   Olmesartan-amLODIPine-HCTZ 40-10-25 MG TABS Take 1 tablet by mouth daily. 90 tablet 1   tamsulosin (FLOMAX) 0.4 MG CAPS capsule Take 0.8 mg by mouth at bedtime.     No current facility-administered medications on file prior to visit.    There were no vitals taken for this visit.      Objective:   Physical Exam Vitals and nursing note reviewed.  Constitutional:      Appearance: Normal appearance.  Cardiovascular:     Rate and Rhythm: Normal rate and regular rhythm.     Pulses: Normal pulses.     Heart sounds: Normal heart sounds.  Pulmonary:     Effort: Pulmonary effort is normal.     Breath sounds: Normal breath sounds.  Skin:    General: Skin is warm and dry.     Capillary Refill: Capillary refill takes less than 2 seconds.  Neurological:     General: No focal deficit present.     Mental Status: He is alert and oriented to person, place, and time.  Psychiatric:        Mood and Affect: Mood normal.        Behavior: Behavior normal.        Thought Content: Thought content normal.        Judgment: Judgment  normal.      Assessment & Plan:  1. Essential hypertension - Not at goal. Will add hydralazine to his regimen  - Follow up in 4  weeks.  - hydrALAZINE (APRESOLINE) 25 MG tablet; Take 1 tablet (25 mg total) by mouth 3 (three) times daily.  Dispense: 90 tablet; Refill: 0  2. Anxiety and depression - Will have him start taking Wellbutrin at night  - Follow up in one month  - He is going to send in [facility] paperwork   3. Acute cough  - benzonatate (TESSALON) 200 MG capsule; Take 1 capsule (200 mg total) by mouth 2 (two) times daily as needed for cough.  Dispense: 20 capsule; Refill: 0  [employer]

## 2021-02-03 NOTE — Telephone Encounter (Signed)
Called pt to advise how to maneuver through Mychart to find letter. However, pt stated that he already found it and does not need another letter. No further action needed!

## 2021-03-03 ENCOUNTER — Other Ambulatory Visit: Payer: Self-pay | Admitting: Infectious Diseases

## 2021-03-03 ENCOUNTER — Other Ambulatory Visit: Payer: Self-pay | Admitting: Adult Health

## 2021-03-03 DIAGNOSIS — F419 Anxiety disorder, unspecified: Secondary | ICD-10-CM

## 2021-03-03 DIAGNOSIS — B20 Human immunodeficiency virus [HIV] disease: Secondary | ICD-10-CM

## 2021-03-03 DIAGNOSIS — F5101 Primary insomnia: Secondary | ICD-10-CM

## 2021-03-03 DIAGNOSIS — I10 Essential (primary) hypertension: Secondary | ICD-10-CM

## 2021-03-03 DIAGNOSIS — Z634 Disappearance and death of family member: Secondary | ICD-10-CM

## 2021-03-03 DIAGNOSIS — F4321 Adjustment disorder with depressed mood: Secondary | ICD-10-CM

## 2021-03-03 NOTE — Telephone Encounter (Signed)
No I reviewed his PCP's notes and they have started different medications for anxiety and hypertension. Will refuse the refills based on [employer], NP's note recently.   Thank you for checking with me.

## 2021-03-03 NOTE — Telephone Encounter (Signed)
Do you want to refill any of these?

## 2021-03-04 ENCOUNTER — Ambulatory Visit: Payer: Medicare Other | Admitting: Adult Health

## 2021-03-07 ENCOUNTER — Other Ambulatory Visit: Payer: Self-pay | Admitting: Adult Health

## 2021-03-07 DIAGNOSIS — F32A Depression, unspecified: Secondary | ICD-10-CM

## 2021-03-07 DIAGNOSIS — F419 Anxiety disorder, unspecified: Secondary | ICD-10-CM

## 2021-03-11 ENCOUNTER — Encounter: Payer: Self-pay | Admitting: Infectious Diseases

## 2021-03-12 ENCOUNTER — Ambulatory Visit (HOSPITAL_COMMUNITY)
Admission: EM | Admit: 2021-03-12 | Discharge: 2021-03-12 | Disposition: A | Discharge status: Home / Self Care | Payer: Medicare Other

## 2021-03-12 ENCOUNTER — Other Ambulatory Visit: Payer: Self-pay

## 2021-03-12 ENCOUNTER — Encounter (HOSPITAL_COMMUNITY): Payer: Self-pay | Admitting: Emergency Medicine

## 2021-03-12 DIAGNOSIS — J0101 Acute recurrent maxillary sinusitis: Secondary | ICD-10-CM | POA: Diagnosis not present

## 2021-03-12 DIAGNOSIS — K047 Periapical abscess without sinus: Secondary | ICD-10-CM | POA: Diagnosis not present

## 2021-03-12 DIAGNOSIS — R0981 Nasal congestion: Secondary | ICD-10-CM

## 2021-03-12 MED ORDER — LIDOCAINE VISCOUS HCL 2 % MT SOLN: OROMUCOSAL | 0 refills | Status: DC

## 2021-03-12 MED ORDER — FLUTICASONE PROPIONATE 50 MCG/ACT NA SUSP: 1.0000 | Freq: Two times a day (BID) | NASAL | 0 refills | Status: DC

## 2021-03-12 MED ORDER — CLINDAMYCIN HCL 150 MG PO CAPS: 150.0000 mg | ORAL_CAPSULE | Freq: Four times a day (QID) | ORAL | 0 refills | Status: AC

## 2021-03-12 NOTE — ED Provider Notes (Signed)
[place]    CSN: [id number] Arrival date & time: 03/12/21  1548      History   Chief Complaint Chief Complaint  Patient presents with   Dental Pain    HPI Jamie Clark is a 70 y.o. male.   Pleasant 70 year old male presents today with a 4 to 5-day history of sinus pain, nasal congestion and sneezing.  He has a known history of allergies.  He is not currently taking anything for it.  He also states he is having dental pain.  He has partial dentures and states that it appears to be rubbing on his gums.  One of his teeth hurts to chew and is slightly loose.  He does have a history of severe dental decay due to his chronic medical conditions of hep C and HIV.  Patient denies a severe headache, ear pain, cough, fever, or mucus production.  He has taken over-the-counter Tylenol without resolution to his symptoms.  He has a known history of hypertension and follows with his PCP for this   Dental Pain  Past Medical History:  Diagnosis Date   Allergy    Anxiety    Depression    Hepatitis C, chronic ([place])    HIV positive ([place])    Hypertension     Patient Active Problem List   Diagnosis Date Noted   Insomnia 04/24/2020   Grief at loss of child 07/04/2019   Anxiety 12/22/2018   Prostatic hypertrophy 12/22/2018   Prediabetes 08/22/2018   Screen for STD (sexually transmitted disease) 08/22/2018   Dyslipidemia 01/17/2009   Essential hypertension 01/17/2009   HIV infection ([place]) 12/18/2008   Hepatitis C virus infection cured after antiviral drug therapy 07/13/2007    Past Surgical History:  Procedure Laterality Date   NO PAST SURGERIES         Home Medications    Prior to Admission medications   Medication Sig Start Date End Date Taking? Authorizing Provider  clindamycin (CLEOCIN) 150 MG capsule Take 1 capsule (150 mg total) by mouth every 6 (six) hours for 7 days. 03/12/21 03/19/21 Yes Godwin Tedesco L, PA  fluticasone (FLONASE) 50 MCG/ACT nasal  spray Place 1 spray into both nostrils in the morning and at bedtime. 03/12/21  Yes Macon Sandiford L, PA  lidocaine (XYLOCAINE) 2 % solution Use a Qtip to apply small amount of jelly to affected tooth four times daily as needed 03/12/21  Yes Mazey Mantell L, PA  abacavir-dolutegravir-lamiVUDine (TRIUMEQ) 600-50-300 MG tablet Take 1 tablet by mouth daily. 12/19/20   Barrett Callas, NP  acetaminophen (TYLENOL) 500 MG tablet Take 2 tablets (1,000 mg total) by mouth every 8 (eight) hours as needed. 11/09/19   Darr, Edison Nasuti, PA-C  amLODipine (NORVASC) 10 MG tablet Take 10 mg by mouth daily. 02/03/21   [provider]  benzonatate (TESSALON) 200 MG capsule Take 1 capsule (200 mg total) by mouth 2 (two) times daily as needed for cough. Patient not taking: Reported on 03/12/2021 01/31/21   Dorothyann Peng, NP  buPROPion (WELLBUTRIN XL) 150 MG 24 hr tablet TAKE 1 TABLET(150 MG) BY MOUTH DAILY 03/12/21   Nafziger, Tommi Rumps, NP  hydrALAZINE (APRESOLINE) 25 MG tablet Take 1 tablet (25 mg total) by mouth 3 (three) times daily. 03/04/21 04/03/21  Nafziger, Tommi Rumps, NP  hydrOXYzine (ATARAX/VISTARIL) 25 MG tablet Take 1 tablet (25 mg total) by mouth 3 (three) times daily as needed for anxiety. 03/21/20   [place] Callas, NP  ibuprofen (ADVIL) 600 MG tablet Take  1 tablet (600 mg total) by mouth every 6 (six) hours as needed. 02/02/20   Raylene Everts, MD  lisinopril-hydrochlorothiazide (ZESTORETIC) 20-25 MG tablet Take 1 tablet by mouth daily. 02/03/21   [provider]  Olmesartan-amLODIPine-HCTZ 40-10-25 MG TABS Take 1 tablet by mouth daily. Patient not taking: Reported on 03/12/2021 01/07/21   Dorothyann Peng, NP  tamsulosin (FLOMAX) 0.4 MG CAPS capsule Take 0.8 mg by mouth at bedtime. Patient not taking: Reported on 03/12/2021 12/05/20   [provider]  traZODone (DESYREL) 50 MG tablet Take 50 mg by mouth at bedtime. 02/03/21   [provider]    Family History Family History   Problem Relation Age of Onset   Cancer Mother    Hypertension Mother    Esophageal cancer Mother    Cancer Father    Hypertension Father    Colon cancer Neg Hx    Colon polyps Neg Hx    Rectal cancer Neg Hx    Stomach cancer Neg Hx     Social History Social History   Tobacco Use   Smoking status: Light Smoker    Packs/day: 0.15    Types: Cigarettes   Smokeless tobacco: Never   Tobacco comments:    3 per day  Vaping Use   Vaping Use: Never used  Substance Use Topics   Alcohol use: No    Alcohol/week: 0.0 standard drinks   Drug use: No     Allergies   Patient has no known allergies.   Review of Systems Review of Systems As per hpi  Physical Exam Triage Vital Signs ED Triage Vitals  Enc Vitals Group     BP 03/12/21 1625 (!) 157/90     Pulse Rate 03/12/21 1625 76     Resp 03/12/21 1625 20     Temp 03/12/21 1625 97.7 F (36.5 C)     Temp Source 03/12/21 1625 Oral     SpO2 03/12/21 1625 95 %     Weight --      Height --      Head Circumference --      Peak Flow --      Pain Score 03/12/21 1621 7     Pain Loc --      Pain Edu? --      Excl. in [place]? --    No data found.  Updated Vital Signs BP (!) 157/90 (BP Location: Right Arm)    Pulse 76    Temp 97.7 F (36.5 C) (Oral)    Resp 20    SpO2 95%   Visual Acuity Right Eye Distance:   Left Eye Distance:   Bilateral Distance:    Right Eye Near:   Left Eye Near:    Bilateral Near:     Physical Exam Vitals and nursing note reviewed.  Constitutional:      Appearance: He is normal weight.     Comments: Chronically ill  HENT:     Head: Normocephalic and atraumatic.     Salivary Glands: Right salivary gland is not diffusely enlarged or tender. Left salivary gland is not diffusely enlarged or tender.     Right Ear: Tympanic membrane and external ear normal. No drainage, swelling or tenderness. No middle ear effusion. There is no impacted cerumen. No mastoid tenderness. No hemotympanum. Tympanic membrane  is not erythematous, retracted or bulging.     Left Ear: Tympanic membrane and external ear normal. No drainage, swelling or tenderness.  No middle ear effusion. There  is no impacted cerumen. No mastoid tenderness. No hemotympanum. Tympanic membrane is not erythematous, retracted or bulging.     Nose: Congestion (turbinate hypertrophy) and rhinorrhea present.     Right Sinus: Maxillary sinus tenderness present.     Left Sinus: Maxillary sinus tenderness present.     Mouth/Throat:     Mouth: Mucous membranes are moist.     Pharynx: No posterior oropharyngeal erythema.     Comments: Severe dental decay with periodontal disease. Gums are friable in area of tenderness to lower jaw near back molar on R. Affected tooth has evidence of decay, mildly tender. Eyes:     General: Lids are normal.     Extraocular Movements: Extraocular movements intact.  Neck:     Thyroid: No thyromegaly or thyroid tenderness.  Musculoskeletal:     Cervical back: Normal range of motion and neck supple. No tenderness.  Lymphadenopathy:     Cervical: No cervical adenopathy.  Neurological:     Mental Status: He is alert.     UC Treatments / Results  Labs (all labs ordered are listed, but only abnormal results are displayed) Labs Reviewed - No data to display  EKG   Radiology No results found.  Procedures Procedures (including critical care time)  Medications Ordered in UC Medications - No data to display  Initial Impression / Assessment and Plan / UC Course  I have reviewed the triage vital signs and the nursing notes.  Pertinent labs & imaging results that were available during my care of the patient were reviewed by me and considered in my medical decision making (see chart for details).     Dental infection/ decay -patient will ultimately need to have further tooth extractions.  In the meantime, will start antibiotics and topical lidocaine.  Recommended patient not wear his partial dentures until  seen by dental specialist. Maxillary sinusitis -we will do a treatment with clindamycin to cover for both dental infection and sinus tenderness.  Add Flonase and sinus rinses Nasal congestion -as above Essential hypertension -following with PCP, new medication just added  Final Clinical Impressions(s) / UC Diagnoses   Final diagnoses:  Dental infection  Acute recurrent maxillary sinusitis  Nasal congestion     Discharge Instructions      Take clindamycin 4 times daily as directed with food.  Please eat a yogurt or take a probiotic daily to prevent diarrhea.  If severe diarrhea occurs please stop immediately and return to our clinic May apply a very small amount of lidocaine to the affected tooth up to 4 times daily as needed.  Ultimately, must establish care with a dentist as this tooth will need to be extracted.  Do not wear your dentures until seen by specialist Use Flonase in each nostril twice daily with nasal congestion.    ED Prescriptions     Medication Sig Dispense Auth. Provider   fluticasone (FLONASE) 50 MCG/ACT nasal spray Place 1 spray into both nostrils in the morning and at bedtime. 16 mL Niraj Kudrna L, PA   clindamycin (CLEOCIN) 150 MG capsule Take 1 capsule (150 mg total) by mouth every 6 (six) hours for 7 days. 28 capsule Liborio Saccente L, PA   lidocaine (XYLOCAINE) 2 % solution Use a Qtip to apply small amount of jelly to affected tooth four times daily as needed 100 mL Tonio Seider L, PA      PDMP not reviewed this encounter.   Maretta Bees, [place] 03/12/21 1701

## 2021-03-12 NOTE — Discharge Instructions (Addendum)
Take clindamycin 4 times daily as directed with food.  Please eat a yogurt or take a probiotic daily to prevent diarrhea.  If severe diarrhea occurs please stop immediately and return to our clinic May apply a very small amount of lidocaine to the affected tooth up to 4 times daily as needed.  Ultimately, must establish care with a dentist as this tooth will need to be extracted.  Do not wear your dentures until seen by specialist Use Flonase in each nostril twice daily with nasal congestion.

## 2021-03-12 NOTE — ED Triage Notes (Signed)
Right top gum pain, reports dentures cutting into gum.  Reports forehead is hurting , has a runny nose.  Nose is stuffy at times

## 2021-03-27 ENCOUNTER — Telehealth: Payer: Self-pay

## 2021-03-27 ENCOUNTER — Ambulatory Visit: Payer: Medicare Other

## 2021-03-27 NOTE — Telephone Encounter (Signed)
Contacted patient on preferred number listed in notes for scheduled AWV. Patient stated unable to complete visit today. Okay to reschedule.

## 2021-05-13 ENCOUNTER — Telehealth: Payer: Self-pay | Admitting: Pharmacist

## 2021-05-13 NOTE — Chronic Care Management (AMB) (Signed)
° ° °  Chronic Care Management Pharmacy Assistant   Name: Jamie Clark  MRN: [id number] DOB: 06/09/1950  Reason for Encounter: Reconnect and reschedule initial visit.   Spoke with patient and appointment scheduled 09/12/2021.  Inetta Fermo [facility]  Clinical Pharmacist Assistant [phone]

## 2021-05-21 ENCOUNTER — Other Ambulatory Visit: Payer: Medicare Other

## 2021-05-23 ENCOUNTER — Other Ambulatory Visit: Payer: Self-pay

## 2021-05-23 DIAGNOSIS — Z113 Encounter for screening for infections with a predominantly sexual mode of transmission: Secondary | ICD-10-CM

## 2021-05-23 DIAGNOSIS — B20 Human immunodeficiency virus [HIV] disease: Secondary | ICD-10-CM

## 2021-05-23 DIAGNOSIS — Z79899 Other long term (current) drug therapy: Secondary | ICD-10-CM

## 2021-05-26 ENCOUNTER — Telehealth: Payer: Self-pay

## 2021-05-26 NOTE — Telephone Encounter (Signed)
Called patient in regards to mychart message sent in, left patient a voicemail to call us back.  ?

## 2021-05-28 ENCOUNTER — Other Ambulatory Visit: Payer: Medicare Other

## 2021-05-30 ENCOUNTER — Other Ambulatory Visit: Payer: Medicare Other

## 2021-05-30 ENCOUNTER — Other Ambulatory Visit: Payer: Self-pay

## 2021-05-30 DIAGNOSIS — B20 Human immunodeficiency virus [HIV] disease: Secondary | ICD-10-CM

## 2021-05-30 DIAGNOSIS — Z113 Encounter for screening for infections with a predominantly sexual mode of transmission: Secondary | ICD-10-CM

## 2021-05-31 LAB — C. TRACHOMATIS/N. GONORRHOEAE RNA
C. trachomatis RNA, TMA: NOT DETECTED
N. gonorrhoeae RNA, TMA: NOT DETECTED

## 2021-06-02 LAB — CBC WITH DIFFERENTIAL/PLATELET
Absolute Monocytes: 427 cells/uL (ref 200–950)
Basophils Absolute: 31 cells/uL (ref 0–200)
Basophils Relative: 0.7 %
Eosinophils Absolute: 233 cells/uL (ref 15–500)
Eosinophils Relative: 5.3 %
HCT: 40.4 % (ref 38.5–50.0)
Hemoglobin: 14.5 g/dL (ref 13.2–17.1)
Lymphs Abs: 2072 cells/uL (ref 850–3900)
MCH: 36.5 pg — ABNORMAL HIGH (ref 27.0–33.0)
MCHC: 35.9 g/dL (ref 32.0–36.0)
MCV: 101.8 fL — ABNORMAL HIGH (ref 80.0–100.0)
MPV: 9.9 fL (ref 7.5–12.5)
Monocytes Relative: 9.7 %
Neutro Abs: 1637 cells/uL (ref 1500–7800)
Neutrophils Relative %: 37.2 %
Platelets: 297 10*3/uL (ref 140–400)
RBC: 3.97 10*6/uL — ABNORMAL LOW (ref 4.20–5.80)
RDW: 13.8 % (ref 11.0–15.0)
Total Lymphocyte: 47.1 %
WBC: 4.4 10*3/uL (ref 3.8–10.8)

## 2021-06-02 LAB — COMPLETE METABOLIC PANEL WITH GFR
AG Ratio: 1.1 (calc) (ref 1.0–2.5)
ALT: 19 U/L (ref 9–46)
AST: 22 U/L (ref 10–35)
Albumin: 4.1 g/dL (ref 3.6–5.1)
Alkaline phosphatase (APISO): 94 U/L (ref 35–144)
BUN: 14 mg/dL (ref 7–25)
CO2: 24 mmol/L (ref 20–32)
Calcium: 9.1 mg/dL (ref 8.6–10.3)
Chloride: 107 mmol/L (ref 98–110)
Creat: 1.19 mg/dL (ref 0.70–1.35)
Globulin: 3.6 g/dL (calc) (ref 1.9–3.7)
Glucose, Bld: 104 mg/dL — ABNORMAL HIGH (ref 65–99)
Potassium: 3.8 mmol/L (ref 3.5–5.3)
Sodium: 139 mmol/L (ref 135–146)
Total Bilirubin: 0.8 mg/dL (ref 0.2–1.2)
Total Protein: 7.7 g/dL (ref 6.1–8.1)
eGFR: 66 mL/min/{1.73_m2} (ref >=60)

## 2021-06-02 LAB — HIV-1 RNA QUANT-NO REFLEX-BLD
HIV 1 RNA Quant: NOT DETECTED Copies/mL
HIV-1 RNA Quant, Log: NOT DETECTED Log cps/mL

## 2021-06-02 LAB — T-HELPER CELLS (CD4) COUNT (NOT AT ARMC)
Absolute CD4: 967 cells/uL (ref 490–1740)
CD4 T Helper %: 44 % (ref 30–61)
Total lymphocyte count: 2179 cells/uL (ref 850–3900)

## 2021-06-02 LAB — RPR: RPR Ser Ql: NONREACTIVE

## 2021-06-04 ENCOUNTER — Encounter: Payer: Medicare Other | Admitting: Infectious Diseases

## 2021-06-06 ENCOUNTER — Encounter: Payer: Medicare Other | Admitting: Infectious Diseases

## 2021-06-10 ENCOUNTER — Telehealth: Payer: Self-pay | Admitting: Adult Health

## 2021-06-10 NOTE — Telephone Encounter (Signed)
Left message for patient to call back and schedule Medicare Annual Wellness Visit (AWV) either virtually or in office. Left  my jabber number [phone] ° ° °Last AWV 03/26/20 ° please schedule at anytime with [facility] Nurse Health Advisor 1 or 2 ° ° °This should be a 45 minute visit.  °

## 2021-06-16 ENCOUNTER — Encounter: Payer: Medicare Other | Admitting: Infectious Diseases

## 2021-06-16 NOTE — Progress Notes (Deleted)
? ? ?Patient ID: Jamie Clark, male   DOB: Aug 30, 1950, 71 y.o.   MRN: [id number] ? ? ?No chief complaint on file. ?  ? ?HPI ?Taking Triumeq everyday without any missed doses.  ?Worried about blood pressure - high today despite taking his lisinopril-HCTZ. He is not taking the amlodipine.  ?Would like flu shot today.  ?Urinating a lot at night and flomax is not helping - feels like he has to "push it out." He takes his BP medication in the mornings before he eats before he goes to work.  ?Mood is much better - he does not like the trazodone or lexapro as they made him feel woozy. He states he most benefits from grief counseling and work.  ? ? ?Review of Systems  ?Constitutional:  Negative for chills and fever.  ?HENT:  Negative for sore throat.   ?     No dental problems  ?Respiratory:  Negative for cough.   ?Cardiovascular:  Negative for chest pain and leg swelling.  ?Gastrointestinal:  Negative for abdominal pain, diarrhea and vomiting.  ?Genitourinary:  Positive for urgency. Negative for dysuria and flank pain.  ?Musculoskeletal:  Negative for myalgias and neck pain.  ?Skin:  Negative for rash.  ?Neurological:  Negative for dizziness and headaches.  ?Psychiatric/Behavioral:  The patient is not nervous/anxious.   ?All other systems reviewed and are negative. ? ? ? ?Outpatient Encounter Medications as of 06/16/2021  ?Medication Sig  ? abacavir-dolutegravir-lamiVUDine (TRIUMEQ) 600-50-300 MG tablet Take 1 tablet by mouth daily.  ? acetaminophen (TYLENOL) 500 MG tablet Take 2 tablets (1,000 mg total) by mouth every 8 (eight) hours as needed.  ? amLODipine (NORVASC) 10 MG tablet Take 10 mg by mouth daily.  ? benzonatate (TESSALON) 200 MG capsule Take 1 capsule (200 mg total) by mouth 2 (two) times daily as needed for cough. (Patient not taking: Reported on 03/12/2021)  ? buPROPion (WELLBUTRIN XL) 150 MG 24 hr tablet TAKE 1 TABLET(150 MG) BY MOUTH DAILY  ? fluticasone (FLONASE) 50 MCG/ACT nasal spray Place 1 spray  into both nostrils in the morning and at bedtime.  ? hydrALAZINE (APRESOLINE) 25 MG tablet Take 1 tablet (25 mg total) by mouth 3 (three) times daily.  ? hydrOXYzine (ATARAX/VISTARIL) 25 MG tablet Take 1 tablet (25 mg total) by mouth 3 (three) times daily as needed for anxiety.  ? ibuprofen (ADVIL) 600 MG tablet Take 1 tablet (600 mg total) by mouth every 6 (six) hours as needed.  ? lidocaine (XYLOCAINE) 2 % solution Use a Qtip to apply small amount of jelly to affected tooth four times daily as needed  ? lisinopril-hydrochlorothiazide (ZESTORETIC) 20-25 MG tablet Take 1 tablet by mouth daily.  ? Olmesartan-amLODIPine-HCTZ 40-10-25 MG TABS Take 1 tablet by mouth daily. (Patient not taking: Reported on 03/12/2021)  ? tamsulosin (FLOMAX) 0.4 MG CAPS capsule Take 0.8 mg by mouth at bedtime. (Patient not taking: Reported on 03/12/2021)  ? traZODone (DESYREL) 50 MG tablet Take 50 mg by mouth at bedtime.  ? ?No facility-administered encounter medications on file as of 06/16/2021.  ?  ? ?Patient Active Problem List  ? Diagnosis Date Noted  ? Insomnia 04/24/2020  ? Grief at loss of child 07/04/2019  ? Anxiety 12/22/2018  ? Prostatic hypertrophy 12/22/2018  ? Prediabetes 08/22/2018  ? Screen for STD (sexually transmitted disease) 08/22/2018  ? Dyslipidemia 01/17/2009  ? Essential hypertension 01/17/2009  ? HIV infection ([facility]) 12/18/2008  ? Hepatitis C virus infection cured after antiviral drug therapy 07/13/2007  ? ? ? ?  Health Maintenance Due  ?Topic Date Due  ? Zoster Vaccines- Shingrix (1 of 2) Never done  ? COVID-19 Vaccine (2 - Janssen risk series) 07/17/2019  ?  ? ?Physical Exam  ?There were no vitals taken for this visit.  ? ? ?Physical Exam ?Pulmonary:  ?   Effort: Pulmonary effort is normal.  ?   Comments: No shortness of breath detected in conversation.  ?Neurological:  ?   Mental Status: He is oriented to person, place, and time.  ?Psychiatric:     ?   Mood and Affect: Mood normal.     ?   Behavior: Behavior normal.      ?   Thought Content: Thought content normal.     ?   Judgment: Judgment normal.  ? ? ? ?Lab Results  ?Component Value Date  ? CD4TCELL 44 05/30/2021  ? ?Lab Results  ?Component Value Date  ? CD4TABS 635 12/19/2020  ? CD4TABS 905 03/05/2020  ? CD4TABS 949 08/30/2019  ? ?Lab Results  ?Component Value Date  ? HIV1RNAQUANT Not Detected 05/30/2021  ? ?Lab Results  ?Component Value Date  ? HEPBSAB NEG 05/06/2010  ? ?Lab Results  ?Component Value Date  ? LABRPR NON-REACTIVE 05/30/2021  ? ? ?CBC ?Lab Results  ?Component Value Date  ? WBC 4.4 05/30/2021  ? RBC 3.97 (L) 05/30/2021  ? HGB 14.5 05/30/2021  ? HCT 40.4 05/30/2021  ? PLT 297 05/30/2021  ? MCV 101.8 (H) 05/30/2021  ? MCH 36.5 (H) 05/30/2021  ? MCHC 35.9 05/30/2021  ? RDW 13.8 05/30/2021  ? LYMPHSABS 2,072 05/30/2021  ? MONOABS 0.3 09/14/2017  ? EOSABS 233 05/30/2021  ? ? ?BMET ?Lab Results  ?Component Value Date  ? NA 139 05/30/2021  ? K 3.8 05/30/2021  ? CL 107 05/30/2021  ? CO2 24 05/30/2021  ? GLUCOSE 104 (H) 05/30/2021  ? BUN 14 05/30/2021  ? CREATININE 1.19 05/30/2021  ? CALCIUM 9.1 05/30/2021  ? GFRNONAA 60 12/14/2019  ? GFRAA 70 12/14/2019  ? ? ?Assessment and Plan ?Problem List Items Addressed This Visit   ?None ? ? ? ?Rexene Alberts, MSN, NP-C ?[facility] ?[facility]  ?[name] ?Pager: [phone] ?Office: [phone] ?[facility] Main Line: [phone] ? ?

## 2021-06-16 NOTE — Patient Instructions (Incomplete)
? ?  Call to reschedule your Medicare Annual Wellness Visit (AWV) either virtually or in office @ [phone] ?  ?  ?

## 2021-06-20 ENCOUNTER — Other Ambulatory Visit: Payer: Self-pay

## 2021-06-20 ENCOUNTER — Encounter: Payer: Self-pay | Admitting: Infectious Diseases

## 2021-06-20 ENCOUNTER — Ambulatory Visit (INDEPENDENT_AMBULATORY_CARE_PROVIDER_SITE_OTHER): Payer: Medicare Other | Admitting: Infectious Diseases

## 2021-06-20 DIAGNOSIS — B20 Human immunodeficiency virus [HIV] disease: Secondary | ICD-10-CM

## 2021-06-20 NOTE — Patient Instructions (Addendum)
Continue your Triumeq! It is working great  ? ?Flonase - over the counter nasal spray to help with stuffy/runny nose ? ?Humidifier in the bedroom if you have dry mouth/nose or bloody noses ?Saline spray to the nose or thin layer of vaseline ? ?Shingrix - two shots to help prevent shingles. At your ability I would recommend you get this done at your local pharmacy ? ?Will repeat your bloodwork in 19m and do another televisit in September/October 2023.  ? ?Please schedule a follow up in person visit with your primary care team to get your yearly check up and repeat your blood pressure check.  ?

## 2021-06-20 NOTE — Progress Notes (Signed)
? ? ?Patient ID: Jamie Clark, male   DOB: 1951/02/20, 71 y.o.   MRN: [id number] ? ?VIRTUAL CARE ENCOUNTER ? ?I connected with [name] on 06/23/21 at  4:00 PM EDT by TELEPHONE and verified that I am speaking with the correct person using two identifiers. ?  ?I discussed the limitations, risks, security and privacy concerns of performing an evaluation and management service by telephone and the availability of in person appointments. I also discussed with the patient that there may be a patient responsible charge related to this service. The patient expressed understanding and agreed to proceed. ? ?Patient Location: [place] residence  ? ?Other Participants:  ? ?Provider Location: [facility]  ? ? ?Chief Complaint  ?Patient presents with  ? Follow-up  ? ?  ? ?HPI ?[name] has been doing much better over the last few months. Sleep and mood have been very good. Working a lot and his schedule is challenging sometimes. Taking Triumeq everyday without any missed doses. No concern for access or side effect to his mediation.  ? ?Allergies are really tough on him this year. Taking Daytime and Nighttime Capsules and benadryl.  ? ?He needs to reschedule an appointment for his wellness check and have his BP reassessed.  ? ? ?Review of Systems  ?Constitutional:  Negative for appetite change, chills, fatigue, fever and unexpected weight change.  ?Eyes:  Negative for visual disturbance.  ?Respiratory:  Negative for cough and shortness of breath.   ?Cardiovascular:  Negative for chest pain and leg swelling.  ?Gastrointestinal:  Negative for abdominal pain, diarrhea and nausea.  ?Genitourinary:  Negative for dysuria, genital sores and penile discharge.  ?Musculoskeletal:  Negative for joint swelling.  ?Skin:  Negative for color change and rash.  ?Neurological:  Negative for dizziness and headaches.  ?Hematological:  Negative for adenopathy.  ?Psychiatric/Behavioral:  Negative for sleep disturbance. The patient is not  nervous/anxious.   ? ? ? ?Outpatient Encounter Medications as of 06/20/2021  ?Medication Sig  ? acetaminophen (TYLENOL) 500 MG tablet Take 2 tablets (1,000 mg total) by mouth every 8 (eight) hours as needed.  ? amLODipine (NORVASC) 10 MG tablet Take 10 mg by mouth daily.  ? buPROPion (WELLBUTRIN XL) 150 MG 24 hr tablet TAKE 1 TABLET(150 MG) BY MOUTH DAILY  ? fluticasone (FLONASE) 50 MCG/ACT nasal spray Place 1 spray into both nostrils in the morning and at bedtime.  ? hydrOXYzine (ATARAX/VISTARIL) 25 MG tablet Take 1 tablet (25 mg total) by mouth 3 (three) times daily as needed for anxiety.  ? ibuprofen (ADVIL) 600 MG tablet Take 1 tablet (600 mg total) by mouth every 6 (six) hours as needed.  ? traZODone (DESYREL) 50 MG tablet Take 50 mg by mouth at bedtime.  ? [DISCONTINUED] abacavir-dolutegravir-lamiVUDine (TRIUMEQ) 600-50-300 MG tablet Take 1 tablet by mouth daily.  ? abacavir-dolutegravir-lamiVUDine (TRIUMEQ) 600-50-300 MG tablet Take 1 tablet by mouth daily.  ? hydrALAZINE (APRESOLINE) 25 MG tablet Take 1 tablet (25 mg total) by mouth 3 (three) times daily.  ? lidocaine (XYLOCAINE) 2 % solution Use a Qtip to apply small amount of jelly to affected tooth four times daily as needed (Patient not taking: Reported on 06/20/2021)  ? lisinopril-hydrochlorothiazide (ZESTORETIC) 20-25 MG tablet Take 1 tablet by mouth daily. (Patient not taking: Reported on 06/20/2021)  ? Olmesartan-amLODIPine-HCTZ 40-10-25 MG TABS Take 1 tablet by mouth daily. (Patient not taking: Reported on 03/12/2021)  ? tamsulosin (FLOMAX) 0.4 MG CAPS capsule Take 0.8 mg by mouth at bedtime. (Patient not taking: Reported  on 03/12/2021)  ? [DISCONTINUED] benzonatate (TESSALON) 200 MG capsule Take 1 capsule (200 mg total) by mouth 2 (two) times daily as needed for cough. (Patient not taking: Reported on 03/12/2021)  ? ?No facility-administered encounter medications on file as of 06/20/2021.  ?  ? ?Patient Active Problem List  ? Diagnosis Date Noted  ?  Insomnia 04/24/2020  ? Grief at loss of child 07/04/2019  ? Anxiety 12/22/2018  ? Prostatic hypertrophy 12/22/2018  ? Prediabetes 08/22/2018  ? Screen for STD (sexually transmitted disease) 08/22/2018  ? Dyslipidemia 01/17/2009  ? Essential hypertension 01/17/2009  ? HIV infection (HCC) 12/18/2008  ? Hepatitis C virus infection cured after antiviral drug therapy 07/13/2007  ? ? ? ?Health Maintenance Due  ?Topic Date Due  ? Zoster Vaccines- Shingrix (1 of 2) Never done  ? COVID-19 Vaccine (2 - Janssen risk series) 07/17/2019  ?  ? ?Physical Exam  ?There were no vitals taken for this visit.  ? ? ?Physical Exam ?Pulmonary:  ?   Effort: Pulmonary effort is normal.  ?   Comments: No shortness of breath detected in conversation.  ?Neurological:  ?   Mental Status: He is oriented to person, place, and time.  ?Psychiatric:     ?   Mood and Affect: Mood normal.     ?   Behavior: Behavior normal.     ?   Thought Content: Thought content normal.     ?   Judgment: Judgment normal.  ? ? ? ?Lab Results  ?Component Value Date  ? CD4TCELL 44 05/30/2021  ? ?Lab Results  ?Component Value Date  ? CD4TABS 635 12/19/2020  ? CD4TABS 905 03/05/2020  ? CD4TABS 949 08/30/2019  ? ?Lab Results  ?Component Value Date  ? HIV1RNAQUANT Not Detected 05/30/2021  ? ?Lab Results  ?Component Value Date  ? HEPBSAB NEG 05/06/2010  ? ?Lab Results  ?Component Value Date  ? LABRPR NON-REACTIVE 05/30/2021  ? ? ?CBC ?Lab Results  ?Component Value Date  ? WBC 4.4 05/30/2021  ? RBC 3.97 (L) 05/30/2021  ? HGB 14.5 05/30/2021  ? HCT 40.4 05/30/2021  ? PLT 297 05/30/2021  ? MCV 101.8 (H) 05/30/2021  ? MCH 36.5 (H) 05/30/2021  ? MCHC 35.9 05/30/2021  ? RDW 13.8 05/30/2021  ? LYMPHSABS 2,072 05/30/2021  ? MONOABS 0.3 09/14/2017  ? EOSABS 233 05/30/2021  ? ? ?BMET ?Lab Results  ?Component Value Date  ? NA 139 05/30/2021  ? K 3.8 05/30/2021  ? CL 107 05/30/2021  ? CO2 24 05/30/2021  ? GLUCOSE 104 (H) 05/30/2021  ? BUN 14 05/30/2021  ? CREATININE 1.19 05/30/2021  ?  CALCIUM 9.1 05/30/2021  ? GFRNONAA 60 12/14/2019  ? GFRAA 70 12/14/2019  ? ? ?Assessment and Plan ?Problem List Items Addressed This Visit   ? ?  ? Unprioritized  ? HIV infection (HCC) (Chronic)  ?  He is undetectable with health CD4, as always. No indications to change treatment plan. Refills x 41yr sent in. He prefers to have labs and check up q81m. We can help arrange this in September with labs and telehealth visit thereafter.  ?I would prefer he see me in person at that time if he is not able to get in to see PCP for a blood pressure check. He understands and will change it if needed.  ?Recommended shingrix vaccine at his ability and Flu vaccine in the fall.  ? ?  ?  ? Relevant Medications  ? abacavir-dolutegravir-lamiVUDine (TRIUMEQ) 600-50-300 MG tablet  ? ?  Other Visit Diagnoses   ? ? Human immunodeficiency virus (HIV) disease (HCC)      ? Relevant Medications  ? abacavir-dolutegravir-lamiVUDine (TRIUMEQ) 600-50-300 MG tablet  ? Other Relevant Orders  ? T-helper cells (CD4) count (not [facility])  ? HIV-1 RNA quant-no reflex-bld  ? ?  ? ? ?Follow Up Instructions: ?As above  ?  ?I discussed the assessment and treatment plan with the patient. The patient was provided an opportunity to ask questions and all were answered. The patient agreed with the plan and demonstrated an understanding of the instructions. ?  ?The patient was advised to call back or seek an in-person evaluation if the symptoms worsen or if the condition fails to improve as anticipated. ? ?I provided 12 minutes of non-face-to-face time during this encounter. ? ?Rexene AlbertsStephanie Jaquelinne Glendening, MSN, NP-C ?[facility] ?[facility]  ?[email or web address] ?Pager: [phone] ?Office: [phone] ?[facility] Main Line: [phone] ?*Secure Chat Communication Welcome  ?

## 2021-06-23 MED ORDER — TRIUMEQ 600-50-300 MG PO TABS: 1.0000 | ORAL_TABLET | Freq: Every day | ORAL | 11 refills | Status: DC

## 2021-06-23 NOTE — Assessment & Plan Note (Addendum)
He is undetectable with health CD4, as always. No indications to change treatment plan. Refills x 6yr sent in. He prefers to have labs and check up q38m. We can help arrange this in September with labs and telehealth visit thereafter.  ?I would prefer he see me in person at that time if he is not able to get in to see PCP for a blood pressure check. He understands and will change it if needed.  ?Recommended shingrix vaccine at his ability and Flu vaccine in the fall.  ? ?

## 2021-07-07 ENCOUNTER — Ambulatory Visit (HOSPITAL_COMMUNITY)
Admission: EM | Admit: 2021-07-07 | Discharge: 2021-07-07 | Disposition: A | Discharge status: Home / Self Care | Payer: Medicare Other | Attending: Sports Medicine | Admitting: Sports Medicine

## 2021-07-07 ENCOUNTER — Encounter (HOSPITAL_COMMUNITY): Payer: Self-pay

## 2021-07-07 ENCOUNTER — Other Ambulatory Visit: Payer: Self-pay | Admitting: Sports Medicine

## 2021-07-07 DIAGNOSIS — S43402A Unspecified sprain of left shoulder joint, initial encounter: Secondary | ICD-10-CM | POA: Diagnosis not present

## 2021-07-07 DIAGNOSIS — S39012A Strain of muscle, fascia and tendon of lower back, initial encounter: Secondary | ICD-10-CM | POA: Diagnosis not present

## 2021-07-07 DIAGNOSIS — J302 Other seasonal allergic rhinitis: Secondary | ICD-10-CM

## 2021-07-07 DIAGNOSIS — M62830 Muscle spasm of back: Secondary | ICD-10-CM

## 2021-07-07 MED ORDER — MONTELUKAST SODIUM 10 MG PO TABS: 10.0000 mg | ORAL_TABLET | Freq: Every day | ORAL | 0 refills | Status: DC

## 2021-07-07 MED ORDER — METHOCARBAMOL 500 MG PO TABS: 500.0000 mg | ORAL_TABLET | Freq: Two times a day (BID) | ORAL | 0 refills | Status: DC

## 2021-07-07 NOTE — ED Triage Notes (Signed)
Pt presents with c/o a pulled muscle. States he needs a work note to go back.  ?

## 2021-07-07 NOTE — ED Provider Notes (Signed)
?[place] ? ? ? ?CSN: [id number] ?Arrival date & time: 07/07/21  1144 ? ? ?  ? ?History   ?Chief Complaint ?Chief Complaint  ?Patient presents with  ? pulled muscle  ? ? ?HPI ?Jamie Clark is a 71 y.o. male who presents with back strain since yesterday. ? ?HPI ? ?Yesterday was pulling up a window and felt a pulling sensation in the low back, moreso on the left side.. Has tried heat and tylenol, but not much help. ? ?Pain is left low back and some in the anterior shoulder. No pop or crack. No previous back or shoulder pains, no previous surgeries. Feels tightness and difficulty with flexing/extending, but is able to move with full range of motion. Some pain with rolling over in bed. ? ?Works for [employer], very active job. Needs note for job to return and limit activity.  ? ?Denies any recent weight loss. No IVDU. No fever/chills. No loss of bowel or bladder incontinence. No saddle anesthesia. ? ?Patient does have notable history of Chronic Hep C. He is HIV positive although is on treatment with Triumeq.  ? ?HIV1-RNA Quant undetectable on last labs 05/30/21. CD4 cells: 967 on labs 05/30/21.  ? ?Allergies and nasal/sinus congestion. Some pressure in upper sinus above right eye. No fever/chills.  ? ?Past Medical History:  ?Diagnosis Date  ? Allergy   ? Anxiety   ? Depression   ? Hepatitis C, chronic ([place])   ? HIV positive ([place])   ? Hypertension   ? ? ?Patient Active Problem List  ? Diagnosis Date Noted  ? Insomnia 04/24/2020  ? Grief at loss of child 07/04/2019  ? Anxiety 12/22/2018  ? Prostatic hypertrophy 12/22/2018  ? Prediabetes 08/22/2018  ? Screen for STD (sexually transmitted disease) 08/22/2018  ? Dyslipidemia 01/17/2009  ? Essential hypertension 01/17/2009  ? HIV infection ([place]) 12/18/2008  ? Hepatitis C virus infection cured after antiviral drug therapy 07/13/2007  ? ? ?Past Surgical History:  ?Procedure Laterality Date  ? NO PAST SURGERIES    ? ? ? ? ? ?Home Medications   ? ?Prior to Admission  medications   ?Medication Sig Start Date End Date Taking? Authorizing Provider  ?methocarbamol (ROBAXIN) 500 MG tablet Take 1 tablet (500 mg total) by mouth 2 (two) times daily. 07/07/21  Yes Elba Barman, DO  ?montelukast (SINGULAIR) 10 MG tablet Take 1 tablet (10 mg total) by mouth at bedtime for 21 days. 07/07/21 07/28/21 Yes Elba Barman, DO  ?abacavir-dolutegravir-lamiVUDine (TRIUMEQ) 600-50-300 MG tablet Take 1 tablet by mouth daily. 06/23/21   [place] Callas, NP  ?acetaminophen (TYLENOL) 500 MG tablet Take 2 tablets (1,000 mg total) by mouth every 8 (eight) hours as needed. 11/09/19   Darr, Edison Nasuti, PA-C  ?amLODipine (NORVASC) 10 MG tablet Take 10 mg by mouth daily. 02/03/21   [provider]  ?buPROPion (WELLBUTRIN XL) 150 MG 24 hr tablet TAKE 1 TABLET(150 MG) BY MOUTH DAILY 03/12/21   Nafziger, Tommi Rumps, NP  ?fluticasone (FLONASE) 50 MCG/ACT nasal spray Place 1 spray into both nostrils in the morning and at bedtime. 03/12/21   Crain, Loree Fee L, PA  ?hydrALAZINE (APRESOLINE) 25 MG tablet Take 1 tablet (25 mg total) by mouth 3 (three) times daily. 03/04/21 04/03/21  Nafziger, Tommi Rumps, NP  ?hydrOXYzine (ATARAX/VISTARIL) 25 MG tablet Take 1 tablet (25 mg total) by mouth 3 (three) times daily as needed for anxiety. 03/21/20   [place] Callas, NP  ?ibuprofen (ADVIL) 600 MG tablet Take 1 tablet (600 mg total)  by mouth every 6 (six) hours as needed. 02/02/20   Raylene Everts, MD  ?lidocaine (XYLOCAINE) 2 % solution Use a Qtip to apply small amount of jelly to affected tooth four times daily as needed ?Patient not taking: Reported on 06/20/2021 03/12/21   Geryl Councilman L, PA  ?lisinopril-hydrochlorothiazide (ZESTORETIC) 20-25 MG tablet Take 1 tablet by mouth daily. ?Patient not taking: Reported on 06/20/2021 02/03/21   [provider]  ?Olmesartan-amLODIPine-HCTZ 40-10-25 MG TABS Take 1 tablet by mouth daily. ?Patient not taking: Reported on 03/12/2021 01/07/21   Dorothyann Peng, NP  ?tamsulosin (FLOMAX)  0.4 MG CAPS capsule Take 0.8 mg by mouth at bedtime. ?Patient not taking: Reported on 03/12/2021 12/05/20   [provider]  ?traZODone (DESYREL) 50 MG tablet Take 50 mg by mouth at bedtime. 02/03/21   [provider]  ? ? ?Family History ?Family History  ?Problem Relation Age of Onset  ? Cancer Mother   ? Hypertension Mother   ? Esophageal cancer Mother   ? Cancer Father   ? Hypertension Father   ? Colon cancer Neg Hx   ? Colon polyps Neg Hx   ? Rectal cancer Neg Hx   ? Stomach cancer Neg Hx   ? ? ?Social History ?Social History  ? ?Tobacco Use  ? Smoking status: Light Smoker  ?  Packs/day: 0.15  ?  Types: Cigarettes  ? Smokeless tobacco: Never  ? Tobacco comments:  ?  3 per day, working on quitting  ?Vaping Use  ? Vaping Use: Never used  ?Substance Use Topics  ? Alcohol use: No  ?  Alcohol/week: 0.0 standard drinks  ? Drug use: No  ? ? ? ?Allergies   ?Patient has no known allergies. ? ? ?Review of Systems ?Review of Systems  ?Constitutional:  Negative for chills, fatigue and fever.  ?HENT:  Positive for congestion, sinus pressure and sneezing. Negative for sore throat.   ?Eyes:  Positive for itching. Negative for visual disturbance.  ?Respiratory:  Negative for shortness of breath.   ?Cardiovascular:  Negative for chest pain.  ?Musculoskeletal:  Positive for arthralgias (left shoulder pain) and back pain. Negative for joint swelling.  ?Skin:  Negative for rash.  ?Allergic/Immunologic: Positive for environmental allergies.  ?Neurological:  Negative for dizziness and light-headedness.  ? ? ?Physical Exam ?Triage Vital Signs ?ED Triage Vitals  ?Enc Vitals Group  ?   BP   ?   Pulse   ?   Resp   ?   Temp   ?   Temp src   ?   SpO2   ?   Weight   ?   Height   ?   Head Circumference   ?   Peak Flow   ?   Pain Score   ?   Pain Loc   ?   Pain Edu?   ?   Excl. in [place]?   ? ?No data found. ? ?Updated Vital Signs ?BP (!) 178/110 (BP Location: Right Arm)   Pulse 76   Temp 98.2 ?F (36.8 ?C) (Oral)   Resp 18    SpO2 98%  ? ?Physical Exam ?Constitutional:   ?   General: He is not in acute distress. ?   Appearance: Normal appearance. He is not toxic-appearing.  ?HENT:  ?   Head: Normocephalic and atraumatic.  ?   Right Ear: Ear canal and external ear normal.  ?   Left Ear: Ear canal and external ear normal.  ?  Nose:  ?   Comments: + boggy nasal mucosa ?   Mouth/Throat:  ?   Mouth: Mucous membranes are moist.  ?   Pharynx: Oropharynx is clear.  ?Eyes:  ?   Pupils: Pupils are equal, round, and reactive to light.  ?   Comments: + allergic shiners ?Normal conjunctivae without injection  ?Cardiovascular:  ?   Rate and Rhythm: Normal rate.  ?Pulmonary:  ?   Effort: Pulmonary effort is normal. No respiratory distress.  ?Musculoskeletal:  ?   Cervical back: Normal range of motion.  ?   Comments: Lumbar spine: inspection reveals no gross deformity or scoliosis.  There is no overlying skin change.  There is no tenderness to palpation over the spinous process.  There is associated hypertonicity and TTP over the left lumbar paraspinal muscles.  No SI joint TTP.  There is full active range of motion of the lumbar spine in flexion and extension, although some pain with flexion.  5/5 strength of bilateral lower extremities and the L4-S1 nerve root distribution. Negative straight leg test. ? ?Left shoulder: Inspection is no erythema, bony deformity or swelling.  There is full range of motion.  He does have some pain with empty can testing, although full strength.  Preserved active internal and external range of motion.  Neurovascular intact distally.  Radial pulse 2/4. ?  ?Skin: ?   Capillary Refill: Capillary refill takes less than 2 seconds.  ?Neurological:  ?   General: No focal deficit present.  ?   Mental Status: He is alert.  ?Psychiatric:     ?   Mood and Affect: Mood normal.     ?   Thought Content: Thought content normal.  ? ? ? ? ?UC Treatments / Results  ?Labs ?(all labs ordered are listed, but only abnormal results are  displayed) ?Labs Reviewed - No data to display ? ?EKG ? ? ?Radiology ?No results found. ? ?Procedures ?Procedures (including critical care time) ? ?Medications Ordered in UC ?Medications - No data to display ? ?Init

## 2021-07-07 NOTE — Discharge Instructions (Addendum)
Alternate ice and heat for 20 minutes at a time 2-3 times daily for the low back ?May do gentle stretching for the low back and hamstrings ? ?We will do a muscle relaxer, Robaxin taken twice daily as needed.  This can make you a little drowsy so recommend taking it in the afternoon and not driving or operating machinery until you see how this makes you feel ? ?You may take Tylenol or ibuprofen for the next few days for pain ? ?If your pain persists greater than 1 week, recommend following up with PCP or orthopedics as above ?

## 2021-07-10 ENCOUNTER — Telehealth: Payer: Self-pay

## 2021-07-10 NOTE — Telephone Encounter (Signed)
Unsuccessful attempt to reach patient on preferred number listed in notes for scheduled AWV. Unable to leave message on voicemail. 

## 2021-07-29 ENCOUNTER — Encounter: Payer: Self-pay | Admitting: Adult Health

## 2021-07-29 ENCOUNTER — Telehealth (INDEPENDENT_AMBULATORY_CARE_PROVIDER_SITE_OTHER): Payer: Medicare Other | Admitting: Adult Health

## 2021-07-29 VITALS — Ht 67.0 in | Wt 175.0 lb

## 2021-07-29 DIAGNOSIS — F32A Depression, unspecified: Secondary | ICD-10-CM

## 2021-07-29 DIAGNOSIS — F419 Anxiety disorder, unspecified: Secondary | ICD-10-CM

## 2021-07-29 DIAGNOSIS — F5101 Primary insomnia: Secondary | ICD-10-CM

## 2021-07-29 MED ORDER — TRAZODONE HCL 50 MG PO TABS: 25.0000 mg | ORAL_TABLET | Freq: Every day | ORAL | 1 refills | Status: DC

## 2021-07-29 MED ORDER — BUPROPION HCL ER (XL) 150 MG PO TB24: 150.0000 mg | ORAL_TABLET | Freq: Every day | ORAL | 0 refills | Status: DC

## 2021-07-29 NOTE — Progress Notes (Signed)
Virtual Visit via Telephone Note ? ?I connected with Jamie Clark on 07/29/21 at  5:15 PM EDT by telephone and verified that I am speaking with the correct person using two identifiers. ?  ?I discussed the limitations, risks, security and privacy concerns of performing an evaluation and management service by telephone and the availability of in person appointments. I also discussed with the patient that there may be a patient responsible charge related to this service. The patient expressed understanding and agreed to proceed. ? ?Location patient: home ?Location provider: work or home office ?Participants present for the call: patient, provider ?Patient did not have a visit in the prior 7 days to address this/these issue(s). ? ? ?History of Present Illness: ?71 year old male who  has a past medical history of Allergy, Anxiety, Depression, Hepatitis C, chronic (HCC), HIV positive (HCC), and Hypertension. ? ?He is being evaluated today for anxiety and depression.  Has long history of anxiety and depression.  Has tried multiple SSRIs but ultimately ended up stopping due to the way it made him feel, these often made him feel fatigued.  Most recently after the untimely death of his son he was started on Wellbutrin 150 mg extended release QHS.  He reports that this worked well for him but he ran out of medication.  Reports that his brother passed away in 04/18/2023from a cardiac condition.  He was sick for some time.  Jamie Clark is back to feeling depressed, crying a lot and is unable to sleep.  He denies suicidal ideation.  He would like to go back on Wellbutrin and trial low-dose of trazodone( has been  on in the past as well)  ?  ?Observations/Objective: ?Patient sounds cheerful and well on the phone. ?I do not appreciate any SOB. ?Speech and thought processing are grossly intact. ?Patient reported vitals: ? ?Assessment and Plan: ?1. Anxiety and depression ? ?- buPROPion (WELLBUTRIN XL) 150 MG 24 hr tablet; Take 1  tablet (150 mg total) by mouth daily.  Dispense: 90 tablet; Refill: 0 ?- He has a follow up CPE in one month  ?- Can follow up before that if symptoms get worse. He knows to go to the ER if he has SI  ?2. Primary insomnia ? ?- traZODone (DESYREL) 50 MG tablet; Take 0.5 tablets (25 mg total) by mouth at bedtime.  Dispense: 15 tablet; Refill: 1 ? ? ?Follow Up Instructions: ? ?DIAGMED@ ? ? ?99441 5-10 ?99442 11-20 ?9443 21-30 ?I did not refer this patient for an OV in the next 24 hours for this/these issue(s). ? ?I discussed the assessment and treatment plan with the patient. The patient was provided an opportunity to ask questions and all were answered. The patient agreed with the plan and demonstrated an understanding of the instructions. ?  ?The patient was advised to call back or seek an in-person evaluation if the symptoms worsen or if the condition fails to improve as anticipated. ? ?I provided 23 minutes of non-face-to-face time during this encounter. ? ? ?Shirline Frees, NP  ? ?

## 2021-08-21 ENCOUNTER — Encounter: Payer: Self-pay | Admitting: Adult Health

## 2021-08-21 ENCOUNTER — Ambulatory Visit (INDEPENDENT_AMBULATORY_CARE_PROVIDER_SITE_OTHER): Payer: Medicare Other | Admitting: Adult Health

## 2021-08-21 VITALS — BP 140/90 | HR 75 | Temp 97.8°F | Ht 68.0 in | Wt 182.0 lb

## 2021-08-21 DIAGNOSIS — F32A Depression, unspecified: Secondary | ICD-10-CM | POA: Diagnosis not present

## 2021-08-21 DIAGNOSIS — Z0000 Encounter for general adult medical examination without abnormal findings: Secondary | ICD-10-CM

## 2021-08-21 DIAGNOSIS — F5101 Primary insomnia: Secondary | ICD-10-CM | POA: Diagnosis not present

## 2021-08-21 DIAGNOSIS — Z21 Asymptomatic human immunodeficiency virus [HIV] infection status: Secondary | ICD-10-CM

## 2021-08-21 DIAGNOSIS — I10 Essential (primary) hypertension: Secondary | ICD-10-CM | POA: Diagnosis not present

## 2021-08-21 DIAGNOSIS — F419 Anxiety disorder, unspecified: Secondary | ICD-10-CM

## 2021-08-21 DIAGNOSIS — N400 Enlarged prostate without lower urinary tract symptoms: Secondary | ICD-10-CM | POA: Diagnosis not present

## 2021-08-21 LAB — LIPID PANEL
Cholesterol: 212 mg/dL — ABNORMAL HIGH (ref 0–200)
HDL: 31.3 mg/dL — ABNORMAL LOW (ref >39.00)
LDL Cholesterol: 146 mg/dL — ABNORMAL HIGH (ref 0–99)
NonHDL: 180.63
Total CHOL/HDL Ratio: 7
Triglycerides: 174 mg/dL — ABNORMAL HIGH (ref 0.0–149.0)
VLDL: 34.8 mg/dL (ref 0.0–40.0)

## 2021-08-21 LAB — COMPREHENSIVE METABOLIC PANEL
ALT: 19 U/L (ref 0–53)
AST: 22 U/L (ref 0–37)
Albumin: 4 g/dL (ref 3.5–5.2)
Alkaline Phosphatase: 89 U/L (ref 39–117)
BUN: 15 mg/dL (ref 6–23)
CO2: 25 mEq/L (ref 19–32)
Calcium: 9 mg/dL (ref 8.4–10.5)
Chloride: 107 mEq/L (ref 96–112)
Creatinine, Ser: 1.22 mg/dL (ref 0.40–1.50)
GFR: 60.46 mL/min (ref >60.00)
Glucose, Bld: 102 mg/dL — ABNORMAL HIGH (ref 70–99)
Potassium: 3.7 mEq/L (ref 3.5–5.1)
Sodium: 140 mEq/L (ref 135–145)
Total Bilirubin: 0.8 mg/dL (ref 0.2–1.2)
Total Protein: 7.6 g/dL (ref 6.0–8.3)

## 2021-08-21 LAB — CBC WITH DIFFERENTIAL/PLATELET
Basophils Absolute: 0 10*3/uL (ref 0.0–0.1)
Basophils Relative: 0.8 % (ref 0.0–3.0)
Eosinophils Absolute: 0.2 10*3/uL (ref 0.0–0.7)
Eosinophils Relative: 5.8 % — ABNORMAL HIGH (ref 0.0–5.0)
HCT: 40.6 % (ref 39.0–52.0)
Hemoglobin: 14 g/dL (ref 13.0–17.0)
Lymphocytes Relative: 50.9 % — ABNORMAL HIGH (ref 12.0–46.0)
Lymphs Abs: 1.8 10*3/uL (ref 0.7–4.0)
MCHC: 34.5 g/dL (ref 30.0–36.0)
MCV: 110.9 fl — ABNORMAL HIGH (ref 78.0–100.0)
Monocytes Absolute: 0.3 10*3/uL (ref 0.1–1.0)
Monocytes Relative: 8.6 % (ref 3.0–12.0)
Neutro Abs: 1.2 10*3/uL — ABNORMAL LOW (ref 1.4–7.7)
Neutrophils Relative %: 33.9 % — ABNORMAL LOW (ref 43.0–77.0)
Platelets: 272 10*3/uL (ref 150.0–400.0)
RBC: 3.66 Mil/uL — ABNORMAL LOW (ref 4.22–5.81)
RDW: 15.5 % (ref 11.5–15.5)
WBC: 3.6 10*3/uL — ABNORMAL LOW (ref 4.0–10.5)

## 2021-08-21 LAB — PSA: PSA: 9.06 ng/mL — ABNORMAL HIGH (ref 0.10–4.00)

## 2021-08-21 LAB — HEMOGLOBIN A1C: Hgb A1c MFr Bld: 5.9 % (ref 4.6–6.5)

## 2021-08-21 LAB — TSH: TSH: 1.92 u[IU]/mL (ref 0.35–5.50)

## 2021-08-21 MED ORDER — HYDROXYZINE HCL 25 MG PO TABS: 25.0000 mg | ORAL_TABLET | Freq: Three times a day (TID) | ORAL | 3 refills | Status: AC

## 2021-08-21 MED ORDER — OLMESARTAN-AMLODIPINE-HCTZ 40-10-25 MG PO TABS: 1.0000 | ORAL_TABLET | Freq: Every day | ORAL | 3 refills | Status: DC

## 2021-08-21 MED ORDER — TRAZODONE HCL 50 MG PO TABS: 25.0000 mg | ORAL_TABLET | Freq: Every day | ORAL | 1 refills | Status: DC

## 2021-08-21 NOTE — Patient Instructions (Addendum)
It was great seeing you today   We will follow up with you regarding your lab work   I will see you back in one year for your next physical exam   Get either metamucil or benefiber at the pharmacy and use every morning

## 2021-08-21 NOTE — Progress Notes (Signed)
Subjective:    Patient ID: Jamie Clark, male    DOB: 12/08/1950, 71 y.o.   MRN: [id number]  HPI Patient presents for yearly preventative medicine examination. He is a pleasant 71 year old male who  has a past medical history of Allergy, Anxiety, Depression, Hepatitis C, chronic (HCC), HIV positive (HCC), and Hypertension.  Anxiety and Depression -long history of anxiety and depression.  Has been trialed on multiple SSRIs in the past but ultimately ended up stopping these due to the way it made him feel.  Was recently has been on Wellbutrin 150 mg extended release nightly and has done well with this.  Was last seen for anxiety and depression roughly 3 weeks ago, he had run out of medication and his brother had passed away causing him to feel depressed, crying a lot, and unable to sleep.  He denied suicidal ideation.  He needed a new prescription for Wellbutrin 150 mg we also placed him back on trazodone 50 mg for insomnia. He does feel like he is improving but " not there yet   Hypertension -managed with Tribenzor 40-10-25 and hydralazine 25 mg 3 times daily- he has not had any Hydralazine.   BP Readings from Last 10 Encounters:  08/21/21 140/90  07/07/21 (!) 178/110  03/12/21 (!) 157/90  01/31/21 (!) 160/100  01/07/21 (!) 180/84  12/19/20 (!) 175/98  08/26/20 (!) 152/93  06/05/20 (!) 172/81  04/04/20 (!) 168/98  03/01/20 (!) 161/83    HIV disease -managed by infectious disease, currently prescribed Triumeq.  He has an undetectable CD4.  BPH - uses Flomax 0.8 mg daily. Feels controlled on this dose  All immunizations and health maintenance protocols were reviewed with the patient and needed orders were placed.  Appropriate screening laboratory values were ordered for the patient including screening of hyperlipidemia, renal function and hepatic function. If indicated by BPH, a PSA was ordered.  Medication reconciliation,  past medical history, social history, problem list and  allergies were reviewed in detail with the patient  Goals were established with regard to weight loss, exercise, and  diet in compliance with medications  Review of Systems  Constitutional: Negative.   HENT: Negative.    Eyes: Negative.   Respiratory: Negative.    Cardiovascular: Negative.   Gastrointestinal: Negative.   Endocrine: Negative.   Genitourinary: Negative.   Musculoskeletal: Negative.   Skin: Negative.   Allergic/Immunologic: Negative.   Neurological: Negative.   Hematological: Negative.   Psychiatric/Behavioral: Negative.    All other systems reviewed and are negative.  Past Medical History:  Diagnosis Date   Allergy    Anxiety    Depression    Hepatitis C, chronic (HCC)    HIV positive (HCC)    Hypertension     Social History   Socioeconomic History   Marital status: Single    Spouse name: Not on file   Number of children: Not on file   Years of education: Not on file   Highest education level: Not on file  Occupational History   Not on file  Tobacco Use   Smoking status: Light Smoker    Packs/day: 0.15    Types: Cigarettes   Smokeless tobacco: Never   Tobacco comments:    3 per day, working on quitting  Vaping Use   Vaping Use: Never used  Substance and Sexual Activity   Alcohol use: No    Alcohol/week: 0.0 standard drinks   Drug use: No   Sexual activity: Never  Partners: Female    Birth control/protection: Condom    Comment: given condoms 05/2020  Other Topics Concern   Not on file  Social History Narrative   Not on file   Social Determinants of Health   Financial Resource Strain: Not on file  Food Insecurity: Not on file  Transportation Needs: Not on file  Physical Activity: Not on file  Stress: Not on file  Social Connections: Not on file  Intimate Partner Violence: Not on file    Past Surgical History:  Procedure Laterality Date   NO PAST SURGERIES      Family History  Problem Relation Age of Onset   Cancer Mother     Hypertension Mother    Esophageal cancer Mother    Cancer Father    Hypertension Father    Colon cancer Neg Hx    Colon polyps Neg Hx    Rectal cancer Neg Hx    Stomach cancer Neg Hx     No Known Allergies  Current Outpatient Medications on File Prior to Visit  Medication Sig Dispense Refill   abacavir-dolutegravir-lamiVUDine (TRIUMEQ) 600-50-300 MG tablet Take 1 tablet by mouth daily. 30 tablet 11   acetaminophen (TYLENOL) 500 MG tablet Take 2 tablets (1,000 mg total) by mouth every 8 (eight) hours as needed. 30 tablet 0   buPROPion (WELLBUTRIN XL) 150 MG 24 hr tablet Take 1 tablet (150 mg total) by mouth daily. 90 tablet 0   fluticasone (FLONASE) 50 MCG/ACT nasal spray Place 1 spray into both nostrils in the morning and at bedtime. 16 mL 0   hydrOXYzine (ATARAX/VISTARIL) 25 MG tablet Take 1 tablet (25 mg total) by mouth 3 (three) times daily as needed for anxiety. 30 tablet 0   ibuprofen (ADVIL) 600 MG tablet Take 1 tablet (600 mg total) by mouth every 6 (six) hours as needed. 30 tablet 0   methocarbamol (ROBAXIN) 500 MG tablet Take 1 tablet (500 mg total) by mouth 2 (two) times daily. 20 tablet 0   montelukast (SINGULAIR) 10 MG tablet TAKE 1 TABLET(10 MG) BY MOUTH AT BEDTIME FOR 21 DAYS 90 tablet 0   Olmesartan-amLODIPine-HCTZ 40-10-25 MG TABS Take 1 tablet by mouth daily. 90 tablet 1   tamsulosin (FLOMAX) 0.4 MG CAPS capsule Take 0.8 mg by mouth at bedtime.     traZODone (DESYREL) 50 MG tablet Take 0.5 tablets (25 mg total) by mouth at bedtime. (Patient taking differently: Take 25 mg by mouth as needed.) 15 tablet 1   No current facility-administered medications on file prior to visit.    BP 140/90   Pulse 75   Temp 97.8 F (36.6 C) (Oral)   Ht 5\' 8"  (1.727 m)   Wt 182 lb (82.6 kg)   SpO2 95%   BMI 27.67 kg/m       Objective:   Physical Exam Vitals and nursing note reviewed.  Constitutional:      General: He is not in acute distress.    Appearance: Normal  appearance. He is well-developed and normal weight.  HENT:     Head: Normocephalic and atraumatic.     Right Ear: Tympanic membrane, ear canal and external ear normal. There is no impacted cerumen.     Left Ear: Tympanic membrane, ear canal and external ear normal. There is no impacted cerumen.     Nose: Nose normal. No congestion or rhinorrhea.     Mouth/Throat:     Mouth: Mucous membranes are moist.     Pharynx: Oropharynx is  clear. No oropharyngeal exudate or posterior oropharyngeal erythema.  Eyes:     General:        Right eye: No discharge.        Left eye: No discharge.     Extraocular Movements: Extraocular movements intact.     Conjunctiva/sclera: Conjunctivae normal.     Pupils: Pupils are equal, round, and reactive to light.  Neck:     Vascular: No carotid bruit.     Trachea: No tracheal deviation.  Cardiovascular:     Rate and Rhythm: Normal rate and regular rhythm.     Pulses: Normal pulses.     Heart sounds: Normal heart sounds. No murmur heard.   No friction rub. No gallop.  Pulmonary:     Effort: Pulmonary effort is normal. No respiratory distress.     Breath sounds: Normal breath sounds. No stridor. No wheezing, rhonchi or rales.  Chest:     Chest wall: No tenderness.  Abdominal:     General: Bowel sounds are normal. There is no distension.     Palpations: Abdomen is soft. There is no mass.     Tenderness: There is no abdominal tenderness. There is no right CVA tenderness, left CVA tenderness, guarding or rebound.     Hernia: No hernia is present.  Musculoskeletal:        General: No swelling, tenderness, deformity or signs of injury. Normal range of motion.     Right lower leg: No edema.     Left lower leg: No edema.  Lymphadenopathy:     Cervical: No cervical adenopathy.  Skin:    General: Skin is warm and dry.     Capillary Refill: Capillary refill takes less than 2 seconds.     Coloration: Skin is not jaundiced or pale.     Findings: No bruising,  erythema, lesion or rash.  Neurological:     General: No focal deficit present.     Mental Status: He is alert and oriented to person, place, and time.     Cranial Nerves: No cranial nerve deficit.     Sensory: No sensory deficit.     Motor: No weakness.     Coordination: Coordination normal.     Gait: Gait normal.     Deep Tendon Reflexes: Reflexes normal.  Psychiatric:        Mood and Affect: Mood normal.        Behavior: Behavior normal.        Thought Content: Thought content normal.        Judgment: Judgment normal.      Assessment & Plan:   1. Routine general medical examination at a health care facility - Follow up in one year  - CBC with Differential/Platelet; Future - Comprehensive metabolic panel; Future - Lipid panel; Future - TSH; Future - Hemoglobin A1c; Future  2. Essential hypertension - Will add back Hydralazine  - CBC with Differential/Platelet; Future - Comprehensive metabolic panel; Future - Lipid panel; Future - TSH; Future - Olmesartan-amLODIPine-HCTZ 40-10-25 MG TABS; Take 1 tablet by mouth daily.  Dispense: 90 tablet; Refill: 3 - hydrOXYzine (ATARAX) 25 MG tablet; Take 1 tablet (25 mg total) by mouth 3 (three) times daily.  Dispense: 270 tablet; Refill: 3 - Hemoglobin A1c; Future  3. Prostatic hypertrophy  - PSA; Future  4. Primary insomnia - Continue Trazodone  - CBC with Differential/Platelet; Future - Comprehensive metabolic panel; Future - Lipid panel; Future - TSH; Future - traZODone (DESYREL) 50 MG tablet; Take  0.5 tablets (25 mg total) by mouth at bedtime.  Dispense: 45 tablet; Refill: 1  5. Anxiety and depression - Continue Wellbutrin     08/21/2021    7:32 AM 07/29/2021    4:41 PM 06/20/2021    3:39 PM  PHQ9 SCORE ONLY  PHQ-9 Total Score 10 16 0   - CBC with Differential/Platelet; Future - Comprehensive metabolic panel; Future - Lipid panel; Future - TSH; Future  6. Asymptomatic HIV infection (HCC) - PER ID - CBC with  Differential/Platelet; Future - Comprehensive metabolic panel; Future - Lipid panel; Future - TSH; Future   Shirline Frees, NP

## 2021-08-22 ENCOUNTER — Other Ambulatory Visit: Payer: Self-pay

## 2021-08-22 DIAGNOSIS — N400 Enlarged prostate without lower urinary tract symptoms: Secondary | ICD-10-CM

## 2021-08-22 DIAGNOSIS — R972 Elevated prostate specific antigen [PSA]: Secondary | ICD-10-CM

## 2021-08-22 MED ORDER — ATORVASTATIN CALCIUM 10 MG PO TABS: 10.0000 mg | ORAL_TABLET | Freq: Every day | ORAL | 1 refills | Status: DC

## 2021-08-22 NOTE — Addendum Note (Signed)
Addended by: Waymon Amato R on: 08/22/2021 04:56 PM   Modules accepted: Orders

## 2021-08-26 ENCOUNTER — Other Ambulatory Visit: Payer: Self-pay | Admitting: Adult Health

## 2021-08-26 MED ORDER — FINASTERIDE 5 MG PO TABS: 5.0000 mg | ORAL_TABLET | Freq: Every day | ORAL | 3 refills | Status: DC

## 2021-09-05 ENCOUNTER — Telehealth: Payer: Self-pay | Admitting: Adult Health

## 2021-09-05 NOTE — Telephone Encounter (Signed)
Left message for patient to call back and schedule Medicare Annual Wellness Visit (AWV) either virtually or in office. Left  my Zachery Conch number [phone]   Last AWV 03/26/20 ; please schedule at anytime with [facility] Nurse Health Advisor 1 or 2

## 2021-09-08 ENCOUNTER — Telehealth: Payer: Self-pay | Admitting: Pharmacist

## 2021-09-08 NOTE — Chronic Care Management (AMB) (Addendum)
Chronic Care Management Pharmacy Assistant   Name: Jamie Clark  MRN: [id number] DOB: [birth year]  Jamie Clark is an 71 y.o. year old male who presents for his initial CCM visit with the clinical pharmacist.  Reason for Encounter: Chart prep for initial visit with [name] clinical pharmacist on 09/12/2021 at 9:00 in the office.    Conditions to be addressed/monitored: HTN, Anxiety, and BPH, prediabetes and HIV  Recent office visits:  08/21/2021 Shirline Frees NP - Patient was seen for Routine general medical examination at a health care facility and additional issues. Discontinued Hydralazine. Follow up in 1 year.   07/29/2021 Shirline Frees NP - Patient was seen for Anxiety and depression and primary insomnia. Decreased Trazodone to 25 mg at bedtime. Discontinued Lidocaine. No follow up noted.   Recent consult visits:  06/20/2021 Rexene Alberts NP (infectious disease) - Patient was seen for Human immunodeficiency virus (HIV) disease. Discontinued Benzonatate. Follow up in 6 months.   Hospital visits:  Patient was seen at [facility] on 07/07/2021 (37 min) due to Low back strain, initial encounter.    New?Medications Started at [facility] Discharge:?? methocarbamol 500 MG tablet Take 1 tablet (500 mg total) by mouth 2 (two) times daily. montelukast 10 MG tablet Take 1 tablet (10 mg total) by mouth at bedtime for 21 days. Medication Changes at Hospital Discharge: No medications changes Medications Discontinued at Hospital Discharge: No medications discontinued Medications that remain the same after Hospital Discharge:??  -All other medications will remain the same.     Patient was seen at [facility] on 03/12/2022 (49 min) due to dental infection.   New?Medications Started at [facility] Discharge:?? clindamycin 150 MG capsule lidocaine 2 % solution Medication Changes at Hospital Discharge: fluticasone 50 MCG/ACT nasal spray Place 1 spray  into both nostrils in the morning and at bedtime. Medications Discontinued at Hospital Discharge: No medications discontinued Medications that remain the same after Hospital Discharge:??  -All other medications will remain the same.  Medications: Outpatient Encounter Medications as of 09/08/2021  Medication Sig   abacavir-dolutegravir-lamiVUDine (TRIUMEQ) 600-50-300 MG tablet Take 1 tablet by mouth daily.   acetaminophen (TYLENOL) 500 MG tablet Take 2 tablets (1,000 mg total) by mouth every 8 (eight) hours as needed.   atorvastatin (LIPITOR) 10 MG tablet Take 1 tablet (10 mg total) by mouth daily.   buPROPion (WELLBUTRIN XL) 150 MG 24 hr tablet Take 1 tablet (150 mg total) by mouth daily.   finasteride (PROSCAR) 5 MG tablet Take 1 tablet (5 mg total) by mouth daily.   fluticasone (FLONASE) 50 MCG/ACT nasal spray Place 1 spray into both nostrils in the morning and at bedtime.   hydrOXYzine (ATARAX) 25 MG tablet Take 1 tablet (25 mg total) by mouth 3 (three) times daily.   ibuprofen (ADVIL) 600 MG tablet Take 1 tablet (600 mg total) by mouth every 6 (six) hours as needed.   methocarbamol (ROBAXIN) 500 MG tablet Take 1 tablet (500 mg total) by mouth 2 (two) times daily.   montelukast (SINGULAIR) 10 MG tablet TAKE 1 TABLET(10 MG) BY MOUTH AT BEDTIME FOR 21 DAYS   Olmesartan-amLODIPine-HCTZ 40-10-25 MG TABS Take 1 tablet by mouth daily.   tamsulosin (FLOMAX) 0.4 MG CAPS capsule Take 0.8 mg by mouth at bedtime.   traZODone (DESYREL) 50 MG tablet Take 0.5 tablets (25 mg total) by mouth at bedtime.   No facility-administered encounter medications on file as of 09/08/2021.  Fill History: atorvastatin 10 mg tablet 08/25/2021  90   BUPROPION XL 150MG  TABLETS (24 H) 07/30/2021 90   FINASTERIDE  5 MG TABS 08/26/2021 90   METHOCARBAMOL 500MG  TABLETS 07/07/2021 10   MONTELUKAST 10MG  TABLETS 07/08/2021 90   OLMESARTAN/AMLOD/HCTZ 40-10-25MG  T 08/21/2021 90   tamsulosin (FLOMAX) capsule 0.4 mg  04/04/2021 30   TRAZODONE 50MG  TABLETS 08/21/2021 90   Have you seen any other providers since your last visit? ** Any changes in your medications or health?  Any side effects from any medications?  Do you have an symptoms or problems not managed by your medications?  Any concerns about your health right now?  Has your provider asked that you check blood pressure, blood sugar, or follow special diet at home?  Do you get any type of exercise on a regular basis?  Can you think of a goal you would like to reach for your health?  Do you have any problems getting your medications?  Is there anything that you would like to discuss during the appointment?   Please bring medications and supplements to appointment  Spoke with patient he has declined our services and has requested to be removed from our program.  He states he is not having any issues with his medications and that his PCP and pharmacy are doing fine managing his medications.    Care Gaps: AWV - message sent to 10/21/2021 Last BP - 140/90 on 08/21/2021 Shingrix - never done Covid booster - overdue  Star Rating Drugs: Atorvastatin 10 mg - last filled 08/25/2021 90 DS at [facility] Olmesartan Amlodipine HCTZ 40/10/25 mg - last filled 08/21/2021 90 DS at [facility]  Clinical Pharmacist Assistant [phone]

## 2021-09-12 ENCOUNTER — Ambulatory Visit: Payer: Commercial Managed Care - HMO

## 2021-09-18 ENCOUNTER — Ambulatory Visit (INDEPENDENT_AMBULATORY_CARE_PROVIDER_SITE_OTHER): Payer: Medicare Other

## 2021-09-18 ENCOUNTER — Ambulatory Visit (INDEPENDENT_AMBULATORY_CARE_PROVIDER_SITE_OTHER): Payer: Medicare Other | Admitting: Podiatry

## 2021-09-18 DIAGNOSIS — M7752 Other enthesopathy of left foot: Secondary | ICD-10-CM

## 2021-09-18 DIAGNOSIS — M79674 Pain in right toe(s): Secondary | ICD-10-CM

## 2021-09-18 DIAGNOSIS — M79675 Pain in left toe(s): Secondary | ICD-10-CM

## 2021-09-18 DIAGNOSIS — M779 Enthesopathy, unspecified: Secondary | ICD-10-CM | POA: Diagnosis not present

## 2021-09-18 DIAGNOSIS — B351 Tinea unguium: Secondary | ICD-10-CM

## 2021-09-18 MED ORDER — TRIAMCINOLONE ACETONIDE 10 MG/ML IJ SUSP
10.0000 mg | Freq: Once | INTRAMUSCULAR | Status: AC
  Administered 2021-09-18: 10 mg

## 2021-09-19 ENCOUNTER — Other Ambulatory Visit: Payer: Self-pay | Admitting: Podiatry

## 2021-09-19 ENCOUNTER — Telehealth: Payer: Self-pay | Admitting: Podiatry

## 2021-09-19 ENCOUNTER — Encounter: Payer: Self-pay | Admitting: Podiatry

## 2021-09-19 MED ORDER — DICLOFENAC SODIUM 75 MG PO TBEC: 75.0000 mg | DELAYED_RELEASE_TABLET | Freq: Two times a day (BID) | ORAL | 2 refills | Status: DC

## 2021-09-19 NOTE — Telephone Encounter (Signed)
Sent in prescription for antifungal

## 2021-09-19 NOTE — Progress Notes (Signed)
Subjective:   Patient ID: Jamie Clark, male   DOB: 71 y.o.   MRN: [id number]   HPI Patient presents with a lot of pain in his left ankle of at least 2 weeks duration stating its been very sore and has developed pain up his leg as he think he is walking differently and has thick yellow elongated nails that he cannot cut and presents with a caregiver.  Patient smokes very lightly tries to be active   Review of Systems  All other systems reviewed and are negative.       Objective:  Physical Exam Vitals and nursing note reviewed.  Constitutional:      Appearance: He is well-developed.  Pulmonary:     Effort: Pulmonary effort is normal.  Musculoskeletal:        General: Normal range of motion.  Skin:    General: Skin is warm.  Neurological:     Mental Status: He is alert.     Neurovascular status was found to be intact muscle strength was found to be adequate range of motion is adequate.  Patient has exquisite discomfort in the left sinus tarsi with inflammation fluid around the joint surface and is noted to have elongated thick nailbeds 1-5 both feet that become painful and make walking difficult at times or wearing shoe gear.  Good digital perfusion well oriented     Assessment:  Inflammatory capsulitis of the left sinus tarsi with fluid buildup along with elongated painful mycotic nail infections 1-5 both feet     Plan:  H&P condition reviewed both conditions discussed sterile prep injected the left sinus tarsi 3 mg Kenalog 5 mg Xylocaine and debrided nailbeds 1-5 both feet no angiogenic bleeding reappoint for routine care as needed  X-rays were negative for sign of arthritis of the joint surface or indications of any fracture

## 2021-09-19 NOTE — Telephone Encounter (Signed)
Pharmacy : [facility] on [address]  Patient states he stopped in the office this morning requesting a pain medication to be called in for his foot, he had an appointment yesterday with Dr Charlsie Merles and thought they were going to call something in then? Please advise

## 2021-10-01 ENCOUNTER — Ambulatory Visit (HOSPITAL_COMMUNITY)
Admission: EM | Admit: 2021-10-01 | Discharge: 2021-10-01 | Disposition: A | Discharge status: Home / Self Care | Payer: Medicare Other | Attending: Physician Assistant | Admitting: Physician Assistant

## 2021-10-01 ENCOUNTER — Encounter (HOSPITAL_COMMUNITY): Payer: Self-pay

## 2021-10-01 DIAGNOSIS — B070 Plantar wart: Secondary | ICD-10-CM

## 2021-10-01 DIAGNOSIS — M79672 Pain in left foot: Secondary | ICD-10-CM | POA: Diagnosis not present

## 2021-10-01 MED ORDER — IBUPROFEN 600 MG PO TABS: 600.0000 mg | ORAL_TABLET | Freq: Four times a day (QID) | ORAL | 0 refills | Status: DC | PRN

## 2021-10-01 NOTE — Discharge Instructions (Signed)
Advised to take ibuprofen 600 mg 1 every 8 hours with food to help reduce the foot pain. Advised to use Epsom salt soaks as this may help to reduce the foot pain and swelling. Advised to call [phone] for an appointment to be seen and evaluated.  Otherwise you may want to keep the appointment with [facility].

## 2021-10-01 NOTE — ED Triage Notes (Addendum)
Pt presents with c/o left foot pain x 3 weeks. Pt states he has an appointment with [facility] tomorrow. Pt states he has hx of cellulitis in rt foot.

## 2021-10-01 NOTE — ED Provider Notes (Signed)
[name]    CSN: [id number] Arrival date & time: 10/01/21  [id number]      History   Chief Complaint Chief Complaint  Patient presents with   Foot Pain    HPI Jamie Clark is a 71 y.o. male.   71 year old male presents with left foot pain.  Patient indicates that he has had for the past 3 weeks left foot pain which is mainly confined to the first second and third digits of the foot.  Patient indicates he has more pain when he was up walking on the foot and the pain tends to improve when the foot is elevated.  Patient does indicate that he has an appointment with [facility] tomorrow morning to be seen.  Patient has been advised to keep this appointment.  Patient does not know what is causing his pain, no history of trauma, no swelling or redness, no fever or chills.   Foot Pain    Past Medical History:  Diagnosis Date   Allergy    Anxiety    Depression    Hepatitis C, chronic (HCC)    HIV positive ([place])    Hypertension     Patient Active Problem List   Diagnosis Date Noted   Insomnia 04/24/2020   Grief at loss of child 07/04/2019   Anxiety 12/22/2018   Prostatic hypertrophy 12/22/2018   Prediabetes 08/22/2018   Screen for STD (sexually transmitted disease) 08/22/2018   Dyslipidemia 01/17/2009   Essential hypertension 01/17/2009   HIV infection ([place]) 12/18/2008   Hepatitis C virus infection cured after antiviral drug therapy 07/13/2007    Past Surgical History:  Procedure Laterality Date   NO PAST SURGERIES         Home Medications    Prior to Admission medications   Medication Sig Start Date End Date Taking? Authorizing Provider  abacavir-dolutegravir-lamiVUDine (TRIUMEQ) 600-50-300 MG tablet Take 1 tablet by mouth daily. 06/23/21   [place] Callas, NP  acetaminophen (TYLENOL) 500 MG tablet Take 2 tablets (1,000 mg total) by mouth every 8 (eight) hours as needed. 11/09/19   Darr, Edison Nasuti, PA-C  atorvastatin (LIPITOR) 10 MG tablet  Take 1 tablet (10 mg total) by mouth daily. 08/22/21   Nafziger, Tommi Rumps, NP  buPROPion (WELLBUTRIN XL) 150 MG 24 hr tablet Take 1 tablet (150 mg total) by mouth daily. 07/29/21   Nafziger, Tommi Rumps, NP  diclofenac (VOLTAREN) 75 MG EC tablet Take 1 tablet (75 mg total) by mouth 2 (two) times daily. Patient not taking: Reported on 10/01/2021 09/19/21   Wallene Huh, DPM  finasteride (PROSCAR) 5 MG tablet Take 1 tablet (5 mg total) by mouth daily. 08/26/21   Nafziger, Tommi Rumps, NP  fluticasone (FLONASE) 50 MCG/ACT nasal spray Place 1 spray into both nostrils in the morning and at bedtime. 03/12/21   Crain, Whitney L, PA  hydrOXYzine (ATARAX) 25 MG tablet Take 1 tablet (25 mg total) by mouth 3 (three) times daily. 08/21/21 11/19/21  Nafziger, Tommi Rumps, NP  ibuprofen (ADVIL) 600 MG tablet Take 1 tablet (600 mg total) by mouth every 6 (six) hours as needed. 10/01/21   Nyoka Lint, PA-C  methocarbamol (ROBAXIN) 500 MG tablet Take 1 tablet (500 mg total) by mouth 2 (two) times daily. 07/07/21   Elba Barman, DO  montelukast (SINGULAIR) 10 MG tablet TAKE 1 TABLET(10 MG) BY MOUTH AT BEDTIME FOR 21 DAYS Patient not taking: Reported on 10/01/2021 07/07/21   Elba Barman, DO  Olmesartan-amLODIPine-HCTZ 40-10-25 MG TABS Take 1 tablet by  mouth daily. 08/21/21   Nafziger, Kandee Keen, NP  tamsulosin (FLOMAX) 0.4 MG CAPS capsule Take 0.8 mg by mouth at bedtime. 12/05/20   [provider]  traZODone (DESYREL) 50 MG tablet Take 0.5 tablets (25 mg total) by mouth at bedtime. Patient not taking: Reported on 10/01/2021 08/21/21 11/19/21  Shirline Frees, NP    Family History Family History  Problem Relation Age of Onset   Cancer Mother    Hypertension Mother    Esophageal cancer Mother    Cancer Father    Hypertension Father    Colon cancer Neg Hx    Colon polyps Neg Hx    Rectal cancer Neg Hx    Stomach cancer Neg Hx     Social History Social History   Tobacco Use   Smoking status: Light Smoker    Packs/day: 0.15    Types:  Cigarettes   Smokeless tobacco: Never   Tobacco comments:    3 per day, working on quitting  Vaping Use   Vaping Use: Never used  Substance Use Topics   Alcohol use: No    Alcohol/week: 0.0 standard drinks of alcohol   Drug use: No     Allergies   Patient has no known allergies.   Review of Systems Review of Systems  Musculoskeletal:  Positive for joint swelling (left big toe, 2nd and 3rd digits). Negative for arthralgias.     Physical Exam Triage Vital Signs ED Triage Vitals  Enc Vitals Group     BP 10/01/21 0942 (!) 188/112     Pulse Rate 10/01/21 0942 68     Resp 10/01/21 0942 16     Temp 10/01/21 0942 98.2 F (36.8 C)     Temp Source 10/01/21 0942 Oral     SpO2 10/01/21 0942 94 %     Weight --      Height --      Head Circumference --      Peak Flow --      Pain Score 10/01/21 0945 8     Pain Loc --      Pain Edu? --      Excl. in GC? --    No data found.  Updated Vital Signs BP (!) 188/112 (BP Location: Left Arm)   Pulse 68   Temp 98.2 F (36.8 C) (Oral)   Resp 16   SpO2 94%   Visual Acuity Right Eye Distance:   Left Eye Distance:   Bilateral Distance:    Right Eye Near:   Left Eye Near:    Bilateral Near:     Physical Exam Constitutional:      Appearance: Normal appearance.  Musculoskeletal:       Feet:  Feet:     Comments: Left foot: Tenderness is palpated at the big toe, second digit, and third digit where there is a plantars wart present.  The big toe and the second digit there is no obvious abnormality other than pain and tenderness on palpation of the nail bed.  There may be some encroachment of the nail into the nailbed that is causing the pain, no redness or swelling. Neurological:     Mental Status: He is alert.      UC Treatments / Results  Labs (all labs ordered are listed, but only abnormal results are displayed) Labs Reviewed - No data to display  EKG   Radiology No results found.  Procedures Procedures  (including critical care time)  Medications Ordered in UC Medications -  No data to display  Initial Impression / Assessment and Plan / UC Course  I have reviewed the triage vital signs and the nursing notes.  Pertinent labs & imaging results that were available during my care of the patient were reviewed by me and considered in my medical decision making (see chart for details).    Plan: 1.  Advised take ibuprofen 600 mg 1 every 8 hours with food to help reduce pain 2.  Advised to use Epsom salt soaks to help reduce the pain. 3.  Advised to follow-up with [facility] and keep the appointment for tomorrow for evaluation. 4.  Advised to follow-up with PCP or return to urgent care if symptoms fail to improve Final Clinical Impressions(s) / UC Diagnoses   Final diagnoses:  Foot pain, left  Plantar wart of left foot     Discharge Instructions      Advised to take ibuprofen 600 mg 1 every 8 hours with food to help reduce the foot pain. Advised to use Epsom salt soaks as this may help to reduce the foot pain and swelling. Ad[phone] for an appointment to be seen and evaluated.  Otherwise you may want to keep the appointment with [facility].    ED Prescriptions     Medication Sig Dispense Auth. Provider   ibuprofen (ADVIL) 600 MG tablet Take 1 tablet (600 mg total) by mouth every 6 (six) hours as needed. 30 tablet Ellsworth Lennox, PA-C      PDMP not reviewed this encounter.   Ellsworth Lennox, PA-C 10/01/21 1027

## 2021-10-02 ENCOUNTER — Ambulatory Visit (INDEPENDENT_AMBULATORY_CARE_PROVIDER_SITE_OTHER): Payer: Medicare Other | Admitting: Podiatry

## 2021-10-02 ENCOUNTER — Encounter: Payer: Self-pay | Admitting: Podiatry

## 2021-10-02 DIAGNOSIS — Q828 Other specified congenital malformations of skin: Secondary | ICD-10-CM | POA: Diagnosis not present

## 2021-10-02 NOTE — Progress Notes (Signed)
Subjective:  Patient ID: Jamie Clark, male    DOB: Oct 16, 1950,  MRN: [id number]  Chief Complaint  Patient presents with   Nail Problem    71 y.o. male presents with the above complaint.  Patient presents with complaint left third digit hyperkeratotic lesion with central nucleated core.  Patient is a painful to touch is progressive gotten worse.  Hurts with ambulation.  He would like to discuss treatment options for this.  He denies seeing anyone else prior to seeing me.  He denies any other acute complaints.  Hurts with ambulation.  He is not a diabetic   Review of Systems: Negative except as noted in the HPI. Denies N/V/F/Ch.  Past Medical History:  Diagnosis Date   Allergy    Anxiety    Depression    Hepatitis C, chronic (HCC)    HIV positive (HCC)    Hypertension     Current Outpatient Medications:    abacavir-dolutegravir-lamiVUDine (TRIUMEQ) 600-50-300 MG tablet, Take 1 tablet by mouth daily., Disp: 30 tablet, Rfl: 11   acetaminophen (TYLENOL) 500 MG tablet, Take 2 tablets (1,000 mg total) by mouth every 8 (eight) hours as needed., Disp: 30 tablet, Rfl: 0   atorvastatin (LIPITOR) 10 MG tablet, Take 1 tablet (10 mg total) by mouth daily., Disp: 90 tablet, Rfl: 1   buPROPion (WELLBUTRIN XL) 150 MG 24 hr tablet, Take 1 tablet (150 mg total) by mouth daily., Disp: 90 tablet, Rfl: 0   diclofenac (VOLTAREN) 75 MG EC tablet, Take 1 tablet (75 mg total) by mouth 2 (two) times daily. (Patient not taking: Reported on 10/01/2021), Disp: 50 tablet, Rfl: 2   finasteride (PROSCAR) 5 MG tablet, Take 1 tablet (5 mg total) by mouth daily., Disp: 90 tablet, Rfl: 3   fluticasone (FLONASE) 50 MCG/ACT nasal spray, Place 1 spray into both nostrils in the morning and at bedtime., Disp: 16 mL, Rfl: 0   hydrOXYzine (ATARAX) 25 MG tablet, Take 1 tablet (25 mg total) by mouth 3 (three) times daily., Disp: 270 tablet, Rfl: 3   ibuprofen (ADVIL) 600 MG tablet, Take 1 tablet (600 mg total) by mouth  every 6 (six) hours as needed., Disp: 30 tablet, Rfl: 0   methocarbamol (ROBAXIN) 500 MG tablet, Take 1 tablet (500 mg total) by mouth 2 (two) times daily., Disp: 20 tablet, Rfl: 0   montelukast (SINGULAIR) 10 MG tablet, TAKE 1 TABLET(10 MG) BY MOUTH AT BEDTIME FOR 21 DAYS (Patient not taking: Reported on 10/01/2021), Disp: 90 tablet, Rfl: 0   Olmesartan-amLODIPine-HCTZ 40-10-25 MG TABS, Take 1 tablet by mouth daily., Disp: 90 tablet, Rfl: 3   tamsulosin (FLOMAX) 0.4 MG CAPS capsule, Take 0.8 mg by mouth at bedtime., Disp: , Rfl:    traZODone (DESYREL) 50 MG tablet, Take 0.5 tablets (25 mg total) by mouth at bedtime. (Patient not taking: Reported on 10/01/2021), Disp: 45 tablet, Rfl: 1  Social History   Tobacco Use  Smoking Status Light Smoker   Packs/day: 0.15   Types: Cigarettes  Smokeless Tobacco Never  Tobacco Comments   3 per day, working on quitting    No Known Allergies Objective:  There were no vitals filed for this visit. There is no height or weight on file to calculate BMI. Constitutional Well developed. Well nourished.  Vascular Dorsalis pedis pulses palpable bilaterally. Posterior tibial pulses palpable bilaterally. Capillary refill normal to all digits.  No cyanosis or clubbing noted. Pedal hair growth normal.  Neurologic Normal speech. Oriented to person, place, and time. Epicritic  sensation to light touch grossly present bilaterally.  Dermatologic Hyperkeratotic lesion with central nucleated core noted.  Pain on palpation.  Hurts with ambulation.  Third digit no extensor or flexor tendinitis noted  Orthopedic: Normal joint ROM without pain or crepitus bilaterally. No visible deformities. No bony tenderness.   Radiographs: None Assessment:   1. Porokeratosis    Plan:  Patient was evaluated and treated and all questions answered.  Left third digit porokeratosis -All questions and concerns were discussed with the patient in extensive detail.  Given the amount  of pain that he is having he will benefit from aggressive debridement of the lesion I agree with the plan and would like to proceed with debridement of the lesion.  Using chisel blade handle lesion was debrided down to healthy scar tissue followed by excision of central nucleated core.  This was a courtesy debridement.   No follow-ups on file.

## 2021-10-23 ENCOUNTER — Encounter: Payer: Self-pay | Admitting: Adult Health

## 2021-10-23 ENCOUNTER — Telehealth (INDEPENDENT_AMBULATORY_CARE_PROVIDER_SITE_OTHER): Payer: Medicare Other | Admitting: Adult Health

## 2021-10-23 VITALS — Ht 68.0 in | Wt 182.0 lb

## 2021-10-23 DIAGNOSIS — K5900 Constipation, unspecified: Secondary | ICD-10-CM

## 2021-10-23 NOTE — Progress Notes (Signed)
Virtual Visit via Telephone Note  I connected with Ardian Wandrey on 10/23/21 at 11:30 AM EDT by telephone and verified that I am speaking with the correct person using two identifiers.   I discussed the limitations, risks, security and privacy concerns of performing an evaluation and management service by telephone and the availability of in person appointments. I also discussed with the patient that there may be a patient responsible charge related to this service. The patient expressed understanding and agreed to proceed.  Location patient: home Location provider: work or home office Participants present for the call: patient, provider Patient did not have a visit in the prior 7 days to address this/these issue(s).   History of Present Illness: 71 year old male who  has a past medical history of Allergy, Anxiety, Depression, Hepatitis C, chronic (HCC), HIV positive (HCC), and Hypertension.  He is being evaluated today for constipation x1 day.  He reports that over the last 2 weeks he has had intermittent constipation.  Yesterday he started to have abdominal cramping and a feeling of tightness in his abdomen.  He subsequently took Pepto-Bismol and Metamucil, when he woke up this morning he felt more constipated but was able to have a very small hard stool.  He has not had any vomiting.  Has not noticed any blood in his stool.   Observations/Objective: Patient sounds cheerful and well on the phone. I do not appreciate any SOB. Speech and thought processing are grossly intact. Patient reported vitals:  Assessment and Plan: 1. Constipation, unspecified constipation type -We will have him drink warm prune juice today.  Once he has a bowel movement advised on starting a daily regiment of Metamucil and drinking more water throughout the day.  He was advised to follow-up if no improvement over the next 24 hours   Follow Up Instructions:  DIAGMED@   [id number] 5-10 [id number] 11-20 [id number]  21-30 I did not refer this patient for an OV in the next 24 hours for this/these issue(s).  I discussed the assessment and treatment plan with the patient. The patient was provided an opportunity to ask questions and all were answered. The patient agreed with the plan and demonstrated an understanding of the instructions.   The patient was advised to call back or seek an in-person evaluation if the symptoms worsen or if the condition fails to improve as anticipated.  I provided 13 minutes of non-face-to-face time during this encounter.   Shirline Frees, NP

## 2021-10-24 ENCOUNTER — Telehealth: Payer: Self-pay | Admitting: Adult Health

## 2021-10-24 NOTE — Telephone Encounter (Signed)
Tried calling patient to schedule Medicare Annual Wellness Visit (AWV) either virtually or in office. Left  my Zachery Conch number [phone]   Last AWV ;03/26/20 please schedule at anytime with [facility] Nurse Health Advisor 1 or 2   No answer .

## 2021-10-25 ENCOUNTER — Other Ambulatory Visit: Payer: Self-pay | Admitting: Adult Health

## 2021-10-25 DIAGNOSIS — F32A Depression, unspecified: Secondary | ICD-10-CM

## 2021-10-25 DIAGNOSIS — F419 Anxiety disorder, unspecified: Secondary | ICD-10-CM

## 2021-10-27 ENCOUNTER — Other Ambulatory Visit: Payer: Self-pay | Admitting: Adult Health

## 2021-11-04 ENCOUNTER — Telehealth: Payer: Self-pay | Admitting: Adult Health

## 2021-11-04 NOTE — Telephone Encounter (Signed)
Tried calling patient to  schedule Medicare Annual Wellness Visit (AWV) either virtually or in office. Left  my jabber number [phone]  No answer   Last AWV ;03/26/20  please schedule at anytime with [facility] Nurse Health Advisor 1 or 2

## 2021-11-05 ENCOUNTER — Telehealth: Payer: Self-pay

## 2021-11-05 NOTE — Telephone Encounter (Signed)
Patient called office today statin he needs his Triumeq and BP medication refilled. Informed patient that he should have refills on Triumeq and needs to call pharmacy. Advised he contact PCP for BP meds.  States that he was told by pharmacy that they could not fill his medication and would need doctors office to call.  Spoke with pharmacist at [facility] pharmacy who states [employer] delivered medication today at 12:11. States [employer] took picture of medication next to red chair.  Relayed this to patient who will look again once off work. Requested he call pharmacy if not able to find package. [name], RMA

## 2021-11-11 ENCOUNTER — Ambulatory Visit: Payer: Medicare Other | Admitting: Adult Health

## 2021-11-11 NOTE — Progress Notes (Deleted)
Subjective:    Patient ID: Jamie Clark, male    DOB: 21-Nov-1950, 71 y.o.   MRN: [id number]  HPI 71 year old male who  has a past medical history of Allergy, Anxiety, Depression, Hepatitis C, chronic (HCC), HIV positive (HCC), and Hypertension.  He presents to the office today for 67-month follow-up regarding hyperlipidemia.  When he was last seen he was started on Lipitor 10 mg daily.  He denies myalgia or fatigue since starting this medication.  He is here for repeat labs  Lab Results  Component Value Date   CHOL 212 (H) 08/21/2021   HDL 31.30 (L) 08/21/2021   LDLCALC 146 (H) 08/21/2021   TRIG 174.0 (H) 08/21/2021   CHOLHDL 7 08/21/2021      Review of Systems See HPI   Past Medical History:  Diagnosis Date   Allergy    Anxiety    Depression    Hepatitis C, chronic (HCC)    HIV positive (HCC)    Hypertension     Social History   Socioeconomic History   Marital status: Single    Spouse name: Not on file   Number of children: Not on file   Years of education: Not on file   Highest education level: 12th grade  Occupational History   Not on file  Tobacco Use   Smoking status: Light Smoker    Packs/day: 0.15    Types: Cigarettes   Smokeless tobacco: Never   Tobacco comments:    3 per day, working on quitting  Vaping Use   Vaping Use: Never used  Substance and Sexual Activity   Alcohol use: No    Alcohol/week: 0.0 standard drinks of alcohol   Drug use: No   Sexual activity: Never    Partners: Female    Birth control/protection: Condom    Comment: given condoms 05/2020  Other Topics Concern   Not on file  Social History Narrative   Not on file   Social Determinants of Health   Financial Resource Strain: Medium Risk (11/11/2021)   Overall Financial Resource Strain (CARDIA)    Difficulty of Paying Living Expenses: Somewhat hard  Food Insecurity: Food Insecurity Present (11/11/2021)   Hunger Vital Sign    Worried About Running Out of Food in the Last  Year: Sometimes true    Ran Out of Food in the Last Year: Often true  Transportation Needs: No Transportation Needs (11/11/2021)   PRAPARE - Administrator, Civil Service (Medical): No    Lack of Transportation (Non-Medical): No  Physical Activity: Insufficiently Active (11/11/2021)   Exercise Vital Sign    Days of Exercise per Week: 3 days    Minutes of Exercise per Session: 30 min  Stress: No Stress Concern Present (11/11/2021)   [employer] of Occupational Health - Occupational Stress Questionnaire    Feeling of Stress : Only a little  Social Connections: Moderately Integrated (11/11/2021)   Social Connection and Isolation Panel [NHANES]    Frequency of Communication with Friends and Family: Twice a week    Frequency of Social Gatherings with Friends and Family: Twice a week    Attends Religious Services: More than 4 times per year    Active Member of [employer] or Organizations: Yes    Attends Banker Meetings: 1 to 4 times per year    Marital Status: Divorced  Intimate Partner Violence: Not At Risk (03/26/2020)   Humiliation, Afraid, Rape, and Kick questionnaire    Fear of  Current or Ex-Partner: No    Emotionally Abused: No    Physically Abused: No    Sexually Abused: No    Past Surgical History:  Procedure Laterality Date   NO PAST SURGERIES      Family History  Problem Relation Age of Onset   Cancer Mother    Hypertension Mother    Esophageal cancer Mother    Cancer Father    Hypertension Father    Colon cancer Neg Hx    Colon polyps Neg Hx    Rectal cancer Neg Hx    Stomach cancer Neg Hx     No Known Allergies  Current Outpatient Medications on File Prior to Visit  Medication Sig Dispense Refill   abacavir-dolutegravir-lamiVUDine (TRIUMEQ) 600-50-300 MG tablet Take 1 tablet by mouth daily. 30 tablet 11   acetaminophen (TYLENOL) 500 MG tablet Take 2 tablets (1,000 mg total) by mouth every 8 (eight) hours as needed. 30 tablet 0    atorvastatin (LIPITOR) 10 MG tablet Take 1 tablet (10 MG total) by MOUTH DAILY. 90 tablet 1   buPROPion (WELLBUTRIN XL) 150 MG 24 hr tablet TAKE 1 TABLET(150 MG) BY MOUTH DAILY 90 tablet 1   diclofenac (VOLTAREN) 75 MG EC tablet Take 1 tablet (75 mg total) by mouth 2 (two) times daily. 50 tablet 2   finasteride (PROSCAR) 5 MG tablet Take 1 tablet (5 mg total) by mouth daily. 90 tablet 3   fluticasone (FLONASE) 50 MCG/ACT nasal spray Place 1 spray into both nostrils in the morning and at bedtime. 16 mL 0   hydrOXYzine (ATARAX) 25 MG tablet Take 1 tablet (25 mg total) by mouth 3 (three) times daily. 270 tablet 3   ibuprofen (ADVIL) 600 MG tablet Take 1 tablet (600 mg total) by mouth every 6 (six) hours as needed. 30 tablet 0   methocarbamol (ROBAXIN) 500 MG tablet Take 1 tablet (500 mg total) by mouth 2 (two) times daily. 20 tablet 0   montelukast (SINGULAIR) 10 MG tablet TAKE 1 TABLET(10 MG) BY MOUTH AT BEDTIME FOR 21 DAYS 90 tablet 0   Olmesartan-amLODIPine-HCTZ 40-10-25 MG TABS Take 1 tablet by mouth daily. 90 tablet 3   tamsulosin (FLOMAX) 0.4 MG CAPS capsule Take 0.8 mg by mouth at bedtime.     traZODone (DESYREL) 50 MG tablet Take 0.5 tablets (25 mg total) by mouth at bedtime. 45 tablet 1   No current facility-administered medications on file prior to visit.    There were no vitals taken for this visit.      Objective:   Physical Exam Vitals and nursing note reviewed.  Constitutional:      Appearance: Normal appearance.  Cardiovascular:     Rate and Rhythm: Normal rate and regular rhythm.     Pulses: Normal pulses.     Heart sounds: Normal heart sounds.  Pulmonary:     Effort: Pulmonary effort is normal.     Breath sounds: Normal breath sounds.  Musculoskeletal:        General: Normal range of motion.  Skin:    General: Skin is warm and dry.  Neurological:     General: No focal deficit present.     Mental Status: He is alert and oriented to person, place, and time.   Psychiatric:        Mood and Affect: Mood normal.        Behavior: Behavior normal.        Thought Content: Thought content normal.  Judgment: Judgment normal.       Assessment & Plan:

## 2021-11-20 ENCOUNTER — Ambulatory Visit: Payer: Medicare Other | Admitting: Adult Health

## 2021-11-21 ENCOUNTER — Encounter: Payer: Self-pay | Admitting: Adult Health

## 2021-11-21 ENCOUNTER — Ambulatory Visit (INDEPENDENT_AMBULATORY_CARE_PROVIDER_SITE_OTHER): Payer: Medicare Other | Admitting: Adult Health

## 2021-11-21 VITALS — BP 130/100 | HR 84 | Temp 97.7°F | Ht 68.0 in | Wt 179.0 lb

## 2021-11-21 DIAGNOSIS — I10 Essential (primary) hypertension: Secondary | ICD-10-CM | POA: Diagnosis not present

## 2021-11-21 MED ORDER — HYDRALAZINE HCL 25 MG PO TABS: 25.0000 mg | ORAL_TABLET | Freq: Three times a day (TID) | ORAL | 2 refills | Status: DC

## 2021-11-21 NOTE — Progress Notes (Signed)
Subjective:    Patient ID: Jamie Clark, male    DOB: [birth year], 71 y.o.   MRN: [id number]  HPI 71 year old male who  has a past medical history of Allergy, Anxiety, Depression, Hepatitis C, chronic ([place]), HIV positive ([place]), and Hypertension.  He presents to the office today for follow up regarding HTN. He is currently managed with Tribenor 40-10-25 mg daily. He has been checking his  blood pressure at home and reports that his blood pressure has " been all over the place". He reports readings in the [id number] systolic. He reports that he has not had his tribenzor in quite some time and is unsure is he has been taking hydralazine     Review of Systems See HPI   Past Medical History:  Diagnosis Date   Allergy    Anxiety    Depression    Hepatitis C, chronic (HCC)    HIV positive ([place])    Hypertension     Social History   Socioeconomic History   Marital status: Single    Spouse name: Not on file   Number of children: Not on file   Years of education: Not on file   Highest education level: 12th grade  Occupational History   Not on file  Tobacco Use   Smoking status: Light Smoker    Packs/day: 0.15    Types: Cigarettes   Smokeless tobacco: Never   Tobacco comments:    3 per day, working on quitting  Vaping Use   Vaping Use: Never used  Substance and Sexual Activity   Alcohol use: No    Alcohol/week: 0.0 standard drinks of alcohol   Drug use: No   Sexual activity: Never    Partners: Female    Birth control/protection: Condom    Comment: given condoms 05/2020  Other Topics Concern   Not on file  Social History Narrative   Not on file   Social Determinants of Health   Financial Resource Strain: Medium Risk (11/11/2021)   Overall Financial Resource Strain (CARDIA)    Difficulty of Paying Living Expenses: Somewhat hard  Food Insecurity: Food Insecurity Present (11/11/2021)   Hunger Vital Sign    Worried About Running Out of Food in the Last Year: Sometimes  true    Ran Out of Food in the Last Year: Often true  Transportation Needs: No Transportation Needs (11/11/2021)   PRAPARE - Hydrologist (Medical): No    Lack of Transportation (Non-Medical): No  Physical Activity: Insufficiently Active (11/11/2021)   Exercise Vital Sign    Days of Exercise per Week: 3 days    Minutes of Exercise per Session: 30 min  Stress: No Stress Concern Present (11/11/2021)   [place]    Feeling of Stress : Only a little  Social Connections: Moderately Integrated (11/11/2021)   Social Connection and Isolation Panel [NHANES]    Frequency of Communication with Friends and Family: Twice a week    Frequency of Social Gatherings with Friends and Family: Twice a week    Attends Religious Services: More than 4 times per year    Active Member of [employer] or Organizations: Yes    Attends Archivist Meetings: 1 to 4 times per year    Marital Status: Divorced  Intimate Partner Violence: Not At Risk (03/26/2020)   Humiliation, Afraid, Rape, and Kick questionnaire    Fear of Current or Ex-Partner: No  Emotionally Abused: No    Physically Abused: No    Sexually Abused: No    Past Surgical History:  Procedure Laterality Date   NO PAST SURGERIES      Family History  Problem Relation Age of Onset   Cancer Mother    Hypertension Mother    Esophageal cancer Mother    Cancer Father    Hypertension Father    Colon cancer Neg Hx    Colon polyps Neg Hx    Rectal cancer Neg Hx    Stomach cancer Neg Hx     No Known Allergies  Current Outpatient Medications on File Prior to Visit  Medication Sig Dispense Refill   abacavir-dolutegravir-lamiVUDine (TRIUMEQ) 600-50-300 MG tablet Take 1 tablet by mouth daily. 30 tablet 11   acetaminophen (TYLENOL) 500 MG tablet Take 2 tablets (1,000 mg total) by mouth every 8 (eight) hours as needed. 30 tablet 0   atorvastatin (LIPITOR) 10  MG tablet Take 1 tablet (10 MG total) by MOUTH DAILY. 90 tablet 1   buPROPion (WELLBUTRIN XL) 150 MG 24 hr tablet TAKE 1 TABLET(150 MG) BY MOUTH DAILY 90 tablet 1   diclofenac (VOLTAREN) 75 MG EC tablet Take 1 tablet (75 mg total) by mouth 2 (two) times daily. 50 tablet 2   finasteride (PROSCAR) 5 MG tablet Take 1 tablet (5 mg total) by mouth daily. 90 tablet 3   fluticasone (FLONASE) 50 MCG/ACT nasal spray Place 1 spray into both nostrils in the morning and at bedtime. 16 mL 0   ibuprofen (ADVIL) 600 MG tablet Take 1 tablet (600 mg total) by mouth every 6 (six) hours as needed. 30 tablet 0   methocarbamol (ROBAXIN) 500 MG tablet Take 1 tablet (500 mg total) by mouth 2 (two) times daily. 20 tablet 0   montelukast (SINGULAIR) 10 MG tablet TAKE 1 TABLET(10 MG) BY MOUTH AT BEDTIME FOR 21 DAYS 90 tablet 0   Olmesartan-amLODIPine-HCTZ 40-10-25 MG TABS Take 1 tablet by mouth daily. 90 tablet 3   tamsulosin (FLOMAX) 0.4 MG CAPS capsule Take 0.8 mg by mouth at bedtime.     traZODone (DESYREL) 50 MG tablet Take 0.5 tablets (25 mg total) by mouth at bedtime. 45 tablet 1   No current facility-administered medications on file prior to visit.    BP (!) 130/100   Pulse 84   Temp 97.7 F (36.5 C) (Oral)   Ht 5\' 8"  (1.727 m)   Wt 179 lb (81.2 kg)   SpO2 96%   BMI 27.22 kg/m       Objective:   Physical Exam Vitals and nursing note reviewed.  Constitutional:      Appearance: Normal appearance.  Cardiovascular:     Rate and Rhythm: Normal rate and regular rhythm.     Pulses: Normal pulses.     Heart sounds: Normal heart sounds.  Pulmonary:     Effort: Pulmonary effort is normal.     Breath sounds: Normal breath sounds.  Musculoskeletal:        General: Normal range of motion.  Skin:    General: Skin is warm and dry.  Neurological:     General: No focal deficit present.     Mental Status: He is alert and oriented to person, place, and time.  Psychiatric:        Mood and Affect: Mood  normal.        Behavior: Behavior normal.        Thought Content: Thought content normal.  Judgment: Judgment normal.        Assessment & Plan:  1. Essential hypertension - Spoke to pharmacist, he has a refill on Tribenzor but has not been getting his hydralazine. Will send this in today for him  - Continue to monitor BP at home and if not at goal in a month then follow up in the office   Shirline Frees, NP

## 2021-11-21 NOTE — Patient Instructions (Signed)
It was great seeing you today   I have sent in a new prescription for hydralazine

## 2021-11-24 ENCOUNTER — Ambulatory Visit (HOSPITAL_COMMUNITY)
Admission: EM | Admit: 2021-11-24 | Discharge: 2021-11-24 | Disposition: A | Discharge status: Home / Self Care | Payer: Medicare Other | Attending: Emergency Medicine | Admitting: Emergency Medicine

## 2021-11-24 ENCOUNTER — Encounter (HOSPITAL_COMMUNITY): Payer: Self-pay | Admitting: *Deleted

## 2021-11-24 DIAGNOSIS — Z1152 Encounter for screening for COVID-19: Secondary | ICD-10-CM | POA: Insufficient documentation

## 2021-11-24 DIAGNOSIS — B349 Viral infection, unspecified: Secondary | ICD-10-CM | POA: Insufficient documentation

## 2021-11-24 LAB — POC INFLUENZA A AND B ANTIGEN (URGENT CARE ONLY)
INFLUENZA A ANTIGEN, POC: NEGATIVE
INFLUENZA B ANTIGEN, POC: NEGATIVE

## 2021-11-24 LAB — SARS CORONAVIRUS 2 BY RT PCR: SARS Coronavirus 2 by RT PCR: POSITIVE — AB

## 2021-11-24 MED ORDER — LIDOCAINE VISCOUS HCL 2 % MT SOLN: 15.0000 mL | OROMUCOSAL | 0 refills | Status: DC | PRN

## 2021-11-24 NOTE — ED Provider Notes (Signed)
[facility]    CSN: [id number] Arrival date & time: 11/24/21  1326     History   Chief Complaint Chief Complaint  Patient presents with   Generalized Body Aches   Cough    HPI Jeremia Groot is a 71 y.o. male.  Presents with 1 day history of congestion, dry cough, chills and body aches. Fever yesterday of 102. Minor sore throat. Reports some pain in the mouth, especially when wearing dentures.  No abdominal pain, nausea/vomiting/diarrhea.  Has been around people who were coughing.  Past Medical History:  Diagnosis Date   Allergy    Anxiety    Depression    Hepatitis C, chronic (HCC)    HIV positive (HCC)    Hypertension     Patient Active Problem List   Diagnosis Date Noted   Insomnia 04/24/2020   Grief at loss of child 07/04/2019   Anxiety 12/22/2018   Prostatic hypertrophy 12/22/2018   Prediabetes 08/22/2018   Screen for STD (sexually transmitted disease) 08/22/2018   Dyslipidemia 01/17/2009   Essential hypertension 01/17/2009   HIV infection (HCC) 12/18/2008   Hepatitis C virus infection cured after antiviral drug therapy 07/13/2007    Past Surgical History:  Procedure Laterality Date   NO PAST SURGERIES         Home Medications    Prior to Admission medications   Medication Sig Start Date End Date Taking? Authorizing Provider  abacavir-dolutegravir-lamiVUDine (TRIUMEQ) 600-50-300 MG tablet Take 1 tablet by mouth daily. 06/23/21  Yes Blanchard Kelch, NP  buPROPion (WELLBUTRIN XL) 150 MG 24 hr tablet TAKE 1 TABLET(150 MG) BY MOUTH DAILY 10/27/21  Yes Nafziger, Kandee Keen, NP  diclofenac (VOLTAREN) 75 MG EC tablet Take 1 tablet (75 mg total) by mouth 2 (two) times daily. 09/19/21  Yes Regal, Kirstie Peri, DPM  fluticasone (FLONASE) 50 MCG/ACT nasal spray Place 1 spray into both nostrils in the morning and at bedtime. 03/12/21  Yes Crain, Whitney L, PA  hydrALAZINE (APRESOLINE) 25 MG tablet Take 1 tablet (25 mg total) by mouth 3 (three) times daily.  11/21/21 02/19/22 Yes Nafziger, Kandee Keen, NP  lidocaine (XYLOCAINE) 2 % solution Use as directed 15 mLs in the mouth or throat as needed for mouth pain. 11/24/21  Yes Laquetta Racey, Lurena Joiner, PA-C  methocarbamol (ROBAXIN) 500 MG tablet Take 1 tablet (500 mg total) by mouth 2 (two) times daily. 07/07/21  Yes Madelyn Brunner, DO  montelukast (SINGULAIR) 10 MG tablet TAKE 1 TABLET(10 MG) BY MOUTH AT BEDTIME FOR 21 DAYS 07/07/21  Yes Madelyn Brunner, DO  Olmesartan-amLODIPine-HCTZ 40-10-25 MG TABS Take 1 tablet by mouth daily. 08/21/21  Yes Nafziger, Kandee Keen, NP  tamsulosin (FLOMAX) 0.4 MG CAPS capsule Take 0.8 mg by mouth at bedtime. 12/05/20  Yes [provider]  traZODone (DESYREL) 50 MG tablet Take 0.5 tablets (25 mg total) by mouth at bedtime. 08/21/21 11/24/21 Yes Nafziger, Kandee Keen, NP  acetaminophen (TYLENOL) 500 MG tablet Take 2 tablets (1,000 mg total) by mouth every 8 (eight) hours as needed. 11/09/19   Darr, Gerilyn Pilgrim, PA-C  atorvastatin (LIPITOR) 10 MG tablet Take 1 tablet (10 MG total) by MOUTH DAILY. 10/28/21   Nafziger, Kandee Keen, NP  finasteride (PROSCAR) 5 MG tablet Take 1 tablet (5 mg total) by mouth daily. 08/26/21   Nafziger, Kandee Keen, NP  ibuprofen (ADVIL) 600 MG tablet Take 1 tablet (600 mg total) by mouth every 6 (six) hours as needed. 10/01/21   Ellsworth Lennox, PA-C    Family History Family History  Problem Relation  Age of Onset   Cancer Mother    Hypertension Mother    Esophageal cancer Mother    Cancer Father    Hypertension Father    Colon cancer Neg Hx    Colon polyps Neg Hx    Rectal cancer Neg Hx    Stomach cancer Neg Hx     Social History Social History   Tobacco Use   Smoking status: Light Smoker    Packs/day: 0.15    Types: Cigarettes   Smokeless tobacco: Never   Tobacco comments:    3 per day, working on quitting  Vaping Use   Vaping Use: Never used  Substance Use Topics   Alcohol use: No    Alcohol/week: 0.0 standard drinks of alcohol   Drug use: No     Allergies   Patient has no known  allergies.   Review of Systems Review of Systems  Respiratory:  Positive for cough.    Per HPI  Physical Exam Triage Vital Signs ED Triage Vitals  Enc Vitals Group     BP 11/24/21 1421 (!) 176/103     Pulse Rate 11/24/21 1421 86     Resp 11/24/21 1421 18     Temp 11/24/21 1421 99 F (37.2 C)     Temp src --      SpO2 11/24/21 1421 96 %     Weight --      Height --      Head Circumference --      Peak Flow --      Pain Score 11/24/21 1424 9     Pain Loc --      Pain Edu? --      Excl. in GC? --    No data found.  Updated Vital Signs BP (!) 176/103 Comment: has not taken HTN med today  Pulse 86   Temp 99 F (37.2 C)   Resp 18   SpO2 96%    Physical Exam Vitals and nursing note reviewed.  Constitutional:      General: He is not in acute distress.    Comments: Very polite 71 year old male  HENT:     Right Ear: Tympanic membrane and ear canal normal.     Left Ear: Tympanic membrane and ear canal normal.     Nose: Congestion present.     Mouth/Throat:     Mouth: Mucous membranes are moist. Oral lesions present.     Pharynx: Uvula midline. No posterior oropharyngeal erythema.      Comments: Aphthous ulcer on right gum Eyes:     Conjunctiva/sclera: Conjunctivae normal.  Cardiovascular:     Rate and Rhythm: Normal rate and regular rhythm.     Pulses: Normal pulses.     Heart sounds: Normal heart sounds.  Pulmonary:     Effort: Pulmonary effort is normal. No respiratory distress.     Breath sounds: Normal breath sounds.  Musculoskeletal:     Cervical back: Normal range of motion.  Lymphadenopathy:     Cervical: No cervical adenopathy.  Skin:    General: Skin is warm and dry.  Neurological:     Mental Status: He is alert and oriented to person, place, and time.      UC Treatments / Results  Labs (all labs ordered are listed, but only abnormal results are displayed) Labs Reviewed  SARS CORONAVIRUS 2 BY RT PCR  POC INFLUENZA A AND B ANTIGEN (URGENT  CARE ONLY)    EKG  Radiology No results found.  Procedures Procedures (including critical care time)  Medications Ordered in UC Medications - No data to display  Initial Impression / Assessment and Plan / UC Course  I have reviewed the triage vital signs and the nursing notes.  Pertinent labs & imaging results that were available during my care of the patient were reviewed by me and considered in my medical decision making (see chart for details).  Flu negative. Covid pending. If positive, no antivirals. Likely viral etiology, recommend symptomatic care. Sent viscous lidocaine to use for aphthous ulcer, sore throat. Recommend lozenges, salt water gargles, etc. Discussed that dentures may irritate this area, may need a few days to heal.  BP elevated, patient has not taken his meds today. Had PCP visit 2 days ago, history of noncompliance with BP meds. No headache, dizziness, vision changes, chest pain, shortness of breath today. Discussed taking BP meds as prescribed, monitoring at home, following up with PCP, strict ED precautions. UC return precautions discussed. Patient agrees to plan.  Final Clinical Impressions(s) / UC Diagnoses   Final diagnoses:  Viral illness  Encounter for screening for COVID-19     Discharge Instructions      We will call you if your COVID or flu test returns positive. You likely have a virus causing your symptoms.  Continue symptomatic care at home.  You can use Tylenol or ibuprofen for body aches, fever, sore throat.  I have sent in a viscous lidocaine solution for you to swish around in the mouth and/or gargle.  This will help with your mouth pain and throat pain.     ED Prescriptions     Medication Sig Dispense Auth. Provider   lidocaine (XYLOCAINE) 2 % solution Use as directed 15 mLs in the mouth or throat as needed for mouth pain. 100 mL Demesha Boorman, Lurena Joiner, PA-C      PDMP not reviewed this encounter.   Kathrine Haddock 11/24/21  1910

## 2021-11-24 NOTE — Discharge Instructions (Addendum)
We will call you if your COVID or flu test returns positive. You likely have a virus causing your symptoms.  Continue symptomatic care at home.  You can use Tylenol or ibuprofen for body aches, fever, sore throat.  I have sent in a viscous lidocaine solution for you to swish around in the mouth and/or gargle.  This will help with your mouth pain and throat pain.

## 2021-11-24 NOTE — ED Triage Notes (Signed)
C/O body aches, runny nose, congestion, cough, chills onset yesterday. Also feels he may have an oral infection, as his dentures aren't fitting, and he has mouth pain.

## 2021-11-25 ENCOUNTER — Telehealth: Payer: Self-pay | Admitting: Adult Health

## 2021-11-25 ENCOUNTER — Encounter: Payer: Self-pay | Admitting: Adult Health

## 2021-11-25 NOTE — Telephone Encounter (Signed)
Pt has a nasal swab result in his chart that he does not understand.  It came back as positive.  He is requesting a call back.

## 2021-12-08 ENCOUNTER — Other Ambulatory Visit: Payer: Medicare Other

## 2021-12-08 ENCOUNTER — Telehealth: Payer: Self-pay

## 2021-12-08 ENCOUNTER — Other Ambulatory Visit: Payer: Self-pay

## 2021-12-08 DIAGNOSIS — B20 Human immunodeficiency virus [HIV] disease: Secondary | ICD-10-CM

## 2021-12-08 NOTE — Telephone Encounter (Signed)
This nurse attempted to call patient for scheduled telephonic AWV. Stated that he is at work now and does not have time to do visit. He states that he is only available after 5pm and on the weekends.

## 2021-12-09 LAB — T-HELPER CELLS (CD4) COUNT (NOT AT ARMC)
CD4 % Helper T Cell: 39 % (ref 33–65)
CD4 T Cell Abs: 763 /uL (ref 400–1790)

## 2021-12-10 DIAGNOSIS — B20 Human immunodeficiency virus [HIV] disease: Secondary | ICD-10-CM

## 2021-12-11 LAB — HIV-1 RNA QUANT-NO REFLEX-BLD
HIV 1 RNA Quant: <20 Copies/mL — ABNORMAL HIGH
HIV-1 RNA Quant, Log: <1.3 Log cps/mL — ABNORMAL HIGH

## 2021-12-15 MED ORDER — TRIUMEQ 600-50-300 MG PO TABS: 1.0000 | ORAL_TABLET | Freq: Every day | ORAL | 3 refills | Status: DC

## 2021-12-18 ENCOUNTER — Other Ambulatory Visit: Payer: Self-pay

## 2021-12-18 ENCOUNTER — Ambulatory Visit (INDEPENDENT_AMBULATORY_CARE_PROVIDER_SITE_OTHER): Payer: Medicare Other | Admitting: Infectious Diseases

## 2021-12-18 DIAGNOSIS — B20 Human immunodeficiency virus [HIV] disease: Secondary | ICD-10-CM | POA: Diagnosis not present

## 2021-12-18 MED ORDER — TRIUMEQ 600-50-300 MG PO TABS: 1.0000 | ORAL_TABLET | Freq: Every day | ORAL | 11 refills | Status: DC

## 2021-12-18 NOTE — Progress Notes (Signed)
Patient ID: Jamie Clark, male   DOB: April 07, 1950, 71 y.o.   MRN: [id number]  VIRTUAL CARE ENCOUNTER  I connected with Jamie Clark on 12/18/21 at  9:45 AM EDT by TELEPHONE and verified that I am speaking with the correct person using two identifiers.   I discussed the limitations, risks, security and privacy concerns of performing an evaluation and management service by telephone and the availability of in person appointments. I also discussed with the patient that there may be a patient responsible charge related to this service. The patient expressed understanding and agreed to proceed.  Patient Location: [place] residence   Other Participants:   Provider Location: [facility] Office    CC:  Chief Complaint  Patient presents with   Follow-up    No concerns today aside from having trouble filling Triumeq.       HPI He is currently recovering from COVID - just primarily weakness primary symptom. He was out sick a few weeks back now but he is back at work 100% now.   Has had a hard time filling triumeq recently - needs new Rx sent over.  Has missed a FU with PCP for wellness check and waiting on call to reschedule this.  Has not gotten his flu shot yet.  Mood/sleep is good.     Review of Systems  Constitutional:  Negative for appetite change, chills, fatigue, fever and unexpected weight change.  Eyes:  Negative for visual disturbance.  Respiratory:  Negative for cough and shortness of breath.   Cardiovascular:  Negative for chest pain and leg swelling.  Gastrointestinal:  Negative for abdominal pain, diarrhea and nausea.  Genitourinary:  Negative for dysuria, genital sores and penile discharge.  Musculoskeletal:  Negative for joint swelling.  Skin:  Negative for color change and rash.  Neurological:  Negative for dizziness and headaches.  Hematological:  Negative for adenopathy.  Psychiatric/Behavioral:  Negative for sleep disturbance. The patient is not  nervous/anxious.       Outpatient Encounter Medications as of 12/18/2021  Medication Sig   abacavir-dolutegravir-lamiVUDine (TRIUMEQ) 600-50-300 MG tablet Take 1 tablet by mouth daily.   acetaminophen (TYLENOL) 500 MG tablet Take 2 tablets (1,000 mg total) by mouth every 8 (eight) hours as needed.   atorvastatin (LIPITOR) 10 MG tablet Take 1 tablet (10 MG total) by MOUTH DAILY.   buPROPion (WELLBUTRIN XL) 150 MG 24 hr tablet TAKE 1 TABLET(150 MG) BY MOUTH DAILY   diclofenac (VOLTAREN) 75 MG EC tablet Take 1 tablet (75 mg total) by mouth 2 (two) times daily.   finasteride (PROSCAR) 5 MG tablet Take 1 tablet (5 mg total) by mouth daily.   fluticasone (FLONASE) 50 MCG/ACT nasal spray Place 1 spray into both nostrils in the morning and at bedtime.   hydrALAZINE (APRESOLINE) 25 MG tablet Take 1 tablet (25 mg total) by mouth 3 (three) times daily.   ibuprofen (ADVIL) 600 MG tablet Take 1 tablet (600 mg total) by mouth every 6 (six) hours as needed.   lidocaine (XYLOCAINE) 2 % solution Use as directed 15 mLs in the mouth or throat as needed for mouth pain.   methocarbamol (ROBAXIN) 500 MG tablet Take 1 tablet (500 mg total) by mouth 2 (two) times daily.   montelukast (SINGULAIR) 10 MG tablet TAKE 1 TABLET(10 MG) BY MOUTH AT BEDTIME FOR 21 DAYS   Olmesartan-amLODIPine-HCTZ 40-10-25 MG TABS Take 1 tablet by mouth daily.   tamsulosin (FLOMAX) 0.4 MG CAPS capsule Take 0.8 mg by mouth at  bedtime.   traZODone (DESYREL) 50 MG tablet Take 0.5 tablets (25 mg total) by mouth at bedtime.   [DISCONTINUED] abacavir-dolutegravir-lamiVUDine (TRIUMEQ) 600-50-300 MG tablet Take 1 tablet by mouth daily.   No facility-administered encounter medications on file as of 12/18/2021.     Patient Active Problem List   Diagnosis Date Noted   Insomnia 04/24/2020   Grief at loss of child 07/04/2019   Anxiety 12/22/2018   Prostatic hypertrophy 12/22/2018   Prediabetes 08/22/2018   Screen for STD (sexually transmitted  disease) 08/22/2018   Dyslipidemia 01/17/2009   Essential hypertension 01/17/2009   HIV infection ([place]) 12/18/2008   Hepatitis C virus infection cured after antiviral drug therapy 07/13/2007     Health Maintenance Due  Topic Date Due   Zoster Vaccines- Shingrix (1 of 2) Never done   COVID-19 Vaccine (2 - Janssen risk series) 07/17/2019   INFLUENZA VACCINE  10/21/2021     Physical Exam  There were no vitals taken for this visit.    Physical Exam Pulmonary:     Effort: Pulmonary effort is normal.     Comments: No shortness of breath detected in conversation.  Neurological:     Mental Status: He is oriented to person, place, and time.  Psychiatric:        Mood and Affect: Mood normal.        Behavior: Behavior normal.        Thought Content: Thought content normal.        Judgment: Judgment normal.      Lab Results  Component Value Date   CD4TCELL 39 12/08/2021   Lab Results  Component Value Date   CD4TABS 763 12/08/2021   CD4TABS 635 12/19/2020   CD4TABS 905 03/05/2020   Lab Results  Component Value Date   HIV1RNAQUANT <20 (H) 12/08/2021   Lab Results  Component Value Date   HEPBSAB NEG 05/06/2010   Lab Results  Component Value Date   LABRPR NON-REACTIVE 05/30/2021    CBC Lab Results  Component Value Date   WBC 3.6 (L) 08/21/2021   RBC 3.66 (L) 08/21/2021   HGB 14.0 08/21/2021   HCT 40.6 08/21/2021   PLT 272.0 08/21/2021   MCV 110.9 Repeated and verified X2. (H) 08/21/2021   MCH 36.5 (H) 05/30/2021   MCHC 34.5 08/21/2021   RDW 15.5 08/21/2021   LYMPHSABS 1.8 08/21/2021   MONOABS 0.3 08/21/2021   EOSABS 0.2 08/21/2021    BMET Lab Results  Component Value Date   NA 140 08/21/2021   K 3.7 08/21/2021   CL 107 08/21/2021   CO2 25 08/21/2021   GLUCOSE 102 (H) 08/21/2021   BUN 15 08/21/2021   CREATININE 1.22 08/21/2021   CALCIUM 9.0 08/21/2021   GFRNONAA 60 12/14/2019   GFRAA 70 12/14/2019    Assessment and Plan Problem List Items  Addressed This Visit       Unprioritized   HIV infection ([place]) (Chronic)    Very well controlled on once daily Triumeq.  His prescription needs a refill today, I provided him with a year to avoid any delays in his dispenses. They are tolerating the medication well without side effects. No drug interactions identified. Pertinent lab tests reviewed today with viral load undetectable and CD4 count in the healthy recovering range above 700 cells.  We discussed briefly U=U and answered all questions to his satisfaction regarding transmission risk to sexual partners being zero while he remains on triumeq daily and continues to keep HIV virus in remisison.  No changes to insurance coverage.  No dental needs today.  No concern over anxious/depressed mood.  Sexual health and family planning discussed -no needs today outside of U=U discussion.  Recommended vaccines discussed including flu shot and new COVID booster if he is interested.  He will plan to get these at local pharmacy and update me with administration dates.  I would like for him to make an appointment to see his PCP for a wellness and prevention check as well as blood pressure check in.  He is agreeable and will make sure to reach out to schedule this week.  I would like for him to come in person for his next appointment since its been a while and we can make sure that we get a blood pressure check at that time.  Labs same day.  Appointment has been scheduled for 6 months       Relevant Medications   abacavir-dolutegravir-lamiVUDine (TRIUMEQ) 600-50-300 MG tablet   Other Visit Diagnoses     Human immunodeficiency virus (HIV) disease (HCC)       Relevant Medications   abacavir-dolutegravir-lamiVUDine (TRIUMEQ) 600-50-300 MG tablet      Follow Up Instructions: As above    I discussed the assessment and treatment plan with the patient. The patient was provided an opportunity to ask questions and all were answered. The patient agreed  with the plan and demonstrated an understanding of the instructions.   The patient was advised to call back or seek an in-person evaluation if the symptoms worsen or if the condition fails to improve as anticipated.  I provided 15 minutes of non-face-to-face time during this encounter.  Rexene Alberts, MSN, NP-C [facility]  [email or web address] Pager: [phone] Office: [phone] [facility] Main Line: [phone] *Secure Chat Communication Welcome

## 2021-12-18 NOTE — Assessment & Plan Note (Signed)
Very well controlled on once daily Triumeq.  His prescription needs a refill today, I provided him with a year to avoid any delays in his dispenses. They are tolerating the medication well without side effects. No drug interactions identified. Pertinent lab tests reviewed today with viral load undetectable and CD4 count in the healthy recovering range above 700 cells.  We discussed briefly U=U and answered all questions to his satisfaction regarding transmission risk to sexual partners being zero while he remains on triumeq daily and continues to keep HIV virus in remisison.  No changes to insurance coverage.  No dental needs today.  No concern over anxious/depressed mood.  Sexual health and family planning discussed -no needs today outside of U=U discussion.  Recommended vaccines discussed including flu shot and new COVID booster if he is interested.  He will plan to get these at local pharmacy and update me with administration dates.  I would like for him to make an appointment to see his PCP for a wellness and prevention check as well as blood pressure check in.  He is agreeable and will make sure to reach out to schedule this week.  I would like for him to come in person for his next appointment since its been a while and we can make sure that we get a blood pressure check at that time.  Labs same day.  Appointment has been scheduled for 6 months

## 2021-12-23 ENCOUNTER — Ambulatory Visit: Payer: Medicare Other | Admitting: Adult Health

## 2021-12-31 ENCOUNTER — Ambulatory Visit: Payer: Medicare Other | Admitting: Podiatry

## 2022-01-08 ENCOUNTER — Telehealth: Payer: Self-pay | Admitting: Adult Health

## 2022-01-08 NOTE — Telephone Encounter (Signed)
Pt called to request a refill of the following: traZODone (DESYREL) 50 MG tablet (Expired)  buPROPion (WELLBUTRIN XL) 150 MG 24 hr tablet  LOV:  11/21/21  [id number] - [place], [place] - [address]

## 2022-01-09 ENCOUNTER — Encounter: Payer: Self-pay | Admitting: *Deleted

## 2022-01-09 ENCOUNTER — Other Ambulatory Visit: Payer: Self-pay

## 2022-01-09 ENCOUNTER — Telehealth: Payer: Self-pay | Admitting: *Deleted

## 2022-01-09 DIAGNOSIS — F5101 Primary insomnia: Secondary | ICD-10-CM

## 2022-01-09 MED ORDER — TRAZODONE HCL 50 MG PO TABS: 25.0000 mg | ORAL_TABLET | Freq: Every day | ORAL | 1 refills | Status: DC

## 2022-01-09 NOTE — Telephone Encounter (Signed)
Pt called, returning CMA's call. CMA was unavailable. Pt asked that CMA call back at her earliest convenience. 

## 2022-01-09 NOTE — Patient Outreach (Signed)
  Care Coordination   Initial Visit Note   01/09/2022 Name: Jamie Clark MRN: [id number] DOB: January 18, 1951  Jamie Clark is a 71 y.o. year old male who sees Nafziger, Tommi Rumps, NP for primary care. I spoke with  Jamie Clark by phone today.  What matters to the patients health and wellness today?  No needs    Goals Addressed               This Visit's Progress     COMPLETED: No needs (pt-stated)        Care Coordination Interventions: Advised patient to encourage pt to contact his provider for AWV Reviewed medications with patient and discussed adherence with all medications with no needed refills Reviewed scheduled/upcoming provider appointments including pending appointments Screening for signs and symptoms of depression related to chronic disease state  Assessed social determinant of health barriers         SDOH assessments and interventions completed:  Yes  SDOH Interventions Today    Flowsheet Row Most Recent Value  SDOH Interventions   Food Insecurity Interventions Intervention Not Indicated  Housing Interventions Intervention Not Indicated  Transportation Interventions Intervention Not Indicated  Utilities Interventions Intervention Not Indicated        Care Coordination Interventions Activated:  Yes  Care Coordination Interventions:  Yes, provided   Follow up plan: No further intervention required.   Encounter Outcome:  Pt. Visit Completed   Jamie Mina, RN Care Management Coordinator [place] Office [phone]

## 2022-01-09 NOTE — Telephone Encounter (Signed)
Patient notified of update  and verbalized understanding. 

## 2022-01-09 NOTE — Telephone Encounter (Signed)
Tried to call pt twice to advise of update but no answer. Closing note.

## 2022-01-09 NOTE — Patient Instructions (Signed)
Visit Information  Thank you for taking time to visit with me today. Please don't hesitate to contact me if I can be of assistance to you.   Following are the goals we discussed today:   Goals Addressed               This Visit's Progress     COMPLETED: No needs (pt-stated)        Care Coordination Interventions: Advised patient to encourage pt to contact his provider for AWV Reviewed medications with patient and discussed adherence with all medications with no needed refills Reviewed scheduled/upcoming provider appointments including pending appointments Screening for signs and symptoms of depression related to chronic disease state  Assessed social determinant of health barriers         Please call the care guide team at [phone] if you need to cancel or reschedule your appointment.   If you are experiencing a Mental Health or [place] or need someone to talk to, please call the Suicide and Crisis Lifeline: 988 call the [place] National Suicide Prevention Lifeline: (316)342-0633 or TTY: 509-269-8729 TTY 430-359-5347) to talk to a trained counselor call 1-800-273-TALK (toll free, 24 hour hotline)  Patient verbalizes understanding of instructions and care plan provided today and agrees to view in [place]. Active MyChart status and patient understanding of how to access instructions and care plan via MyChart confirmed with patient.     No further follow up required: No needs  Raina Mina, RN Care Management Coordinator [place] Office [phone]

## 2022-01-09 NOTE — Telephone Encounter (Signed)
Trazodone refilled. Wellbutrin not fill due to having a refill left.

## 2022-01-17 ENCOUNTER — Other Ambulatory Visit: Payer: Self-pay

## 2022-01-17 ENCOUNTER — Emergency Department (HOSPITAL_COMMUNITY): Payer: Medicare Other

## 2022-01-17 ENCOUNTER — Inpatient Hospital Stay (HOSPITAL_COMMUNITY)
Admission: EM | Admit: 2022-01-17 | Discharge: 2022-01-20 | DRG: 322 | Disposition: A | Discharge status: Home / Self Care | Payer: Medicare Other | Attending: Internal Medicine | Admitting: Internal Medicine

## 2022-01-17 DIAGNOSIS — R6889 Other general symptoms and signs: Secondary | ICD-10-CM | POA: Diagnosis not present

## 2022-01-17 DIAGNOSIS — J9811 Atelectasis: Secondary | ICD-10-CM | POA: Diagnosis not present

## 2022-01-17 DIAGNOSIS — B182 Chronic viral hepatitis C: Secondary | ICD-10-CM | POA: Diagnosis present

## 2022-01-17 DIAGNOSIS — I2510 Atherosclerotic heart disease of native coronary artery without angina pectoris: Secondary | ICD-10-CM | POA: Diagnosis not present

## 2022-01-17 DIAGNOSIS — Z8249 Family history of ischemic heart disease and other diseases of the circulatory system: Secondary | ICD-10-CM

## 2022-01-17 DIAGNOSIS — B20 Human immunodeficiency virus [HIV] disease: Secondary | ICD-10-CM | POA: Diagnosis not present

## 2022-01-17 DIAGNOSIS — I214 Non-ST elevation (NSTEMI) myocardial infarction: Principal | ICD-10-CM | POA: Diagnosis present

## 2022-01-17 DIAGNOSIS — I252 Old myocardial infarction: Secondary | ICD-10-CM | POA: Diagnosis present

## 2022-01-17 DIAGNOSIS — Z79899 Other long term (current) drug therapy: Secondary | ICD-10-CM | POA: Diagnosis not present

## 2022-01-17 DIAGNOSIS — I161 Hypertensive emergency: Secondary | ICD-10-CM | POA: Diagnosis not present

## 2022-01-17 DIAGNOSIS — I10 Essential (primary) hypertension: Secondary | ICD-10-CM | POA: Diagnosis present

## 2022-01-17 DIAGNOSIS — E785 Hyperlipidemia, unspecified: Secondary | ICD-10-CM | POA: Diagnosis not present

## 2022-01-17 DIAGNOSIS — F17210 Nicotine dependence, cigarettes, uncomplicated: Secondary | ICD-10-CM | POA: Diagnosis not present

## 2022-01-17 DIAGNOSIS — I160 Hypertensive urgency: Secondary | ICD-10-CM | POA: Diagnosis present

## 2022-01-17 DIAGNOSIS — I499 Cardiac arrhythmia, unspecified: Secondary | ICD-10-CM | POA: Diagnosis not present

## 2022-01-17 DIAGNOSIS — Z743 Need for continuous supervision: Secondary | ICD-10-CM | POA: Diagnosis not present

## 2022-01-17 DIAGNOSIS — R109 Unspecified abdominal pain: Secondary | ICD-10-CM | POA: Diagnosis not present

## 2022-01-17 DIAGNOSIS — R0602 Shortness of breath: Secondary | ICD-10-CM | POA: Diagnosis not present

## 2022-01-17 DIAGNOSIS — R079 Chest pain, unspecified: Secondary | ICD-10-CM | POA: Diagnosis not present

## 2022-01-17 DIAGNOSIS — E7800 Pure hypercholesterolemia, unspecified: Secondary | ICD-10-CM | POA: Diagnosis not present

## 2022-01-17 LAB — CBC
HCT: 37.9 % — ABNORMAL LOW (ref 39.0–52.0)
Hemoglobin: 13.1 g/dL (ref 13.0–17.0)
MCH: 37.4 pg — ABNORMAL HIGH (ref 26.0–34.0)
MCHC: 34.6 g/dL (ref 30.0–36.0)
MCV: 108.3 fL — ABNORMAL HIGH (ref 80.0–100.0)
Platelets: 299 10*3/uL (ref 150–400)
RBC: 3.5 MIL/uL — ABNORMAL LOW (ref 4.22–5.81)
RDW: 13.6 % (ref 11.5–15.5)
WBC: 5.1 10*3/uL (ref 4.0–10.5)
nRBC: 0 % (ref 0.0–0.2)

## 2022-01-17 LAB — BASIC METABOLIC PANEL
Anion gap: 9 (ref 5–15)
BUN: 9 mg/dL (ref 8–23)
CO2: 23 mmol/L (ref 22–32)
Calcium: 8.9 mg/dL (ref 8.9–10.3)
Chloride: 106 mmol/L (ref 98–111)
Creatinine, Ser: 0.97 mg/dL (ref 0.61–1.24)
GFR, Estimated: >60 mL/min (ref >60)
Glucose, Bld: 115 mg/dL — ABNORMAL HIGH (ref 70–99)
Potassium: 3.2 mmol/L — ABNORMAL LOW (ref 3.5–5.1)
Sodium: 138 mmol/L (ref 135–145)

## 2022-01-17 LAB — TROPONIN I (HIGH SENSITIVITY): Troponin I (High Sensitivity): 721 ng/L (ref <18)

## 2022-01-17 NOTE — ED Notes (Signed)
Charge RN notified of Troponin 721 for prioritization of bed.

## 2022-01-17 NOTE — ED Provider Triage Note (Signed)
Emergency Medicine Provider Triage Evaluation Note  Jamie Clark , a 71 y.o. male  was evaluated in triage.  Pt complains of substernal sharp chest pain which began last night, did have nitroglycerin by EMS which resolved his pain.  He also endorses feeling somewhat short of breath but does not wear any oxygen at home.  He does smoke about 4 cigarettes daily.  Patient did recently have COVID-19, no prior history of blood clots, no prior history of CAD.  Review of Systems  Positive:  Chest pain, sob Negative: Fever, cough  Physical Exam  BP (!) 159/103 (BP Location: Right Arm)   Pulse 84   Temp 98.1 F (36.7 C)   Resp 16   Ht 5\' 7"  (1.702 m)   Wt 81.6 kg   SpO2 94%   BMI 28.19 kg/m  Gen:   Awake, no distress   Resp:  Normal effort  MSK:   Moves extremities without difficulty  Other:    Medical Decision Making  Medically screening exam initiated at 8:23 PM.  Appropriate orders placed.  Achilles Vanvranken was informed that the remainder of the evaluation will be completed by another provider, this initial triage assessment does not replace that evaluation, and the importance of remaining in the ED until their evaluation is complete.     Janeece Fitting, PA-C 01/17/22 2027

## 2022-01-17 NOTE — ED Triage Notes (Signed)
Per pt: CP started on Thursday, progressed throughout the night last night. Reports improvement of pain with nitro

## 2022-01-17 NOTE — ED Triage Notes (Signed)
Pt from home by EMS for CP onset last night. Intermittent, centralized chest pain. Given nitro x1, 324mg  ASA. EMS placed 20g L hand. Noted to be hypertensive, has not taken home medications, due to not feeling well enough. EMS VS BP 200/ palpated, pulse 80, room air sat 93, placed on 2L, sats improved to 95%.

## 2022-01-18 ENCOUNTER — Encounter (HOSPITAL_COMMUNITY): Payer: Self-pay | Admitting: Internal Medicine

## 2022-01-18 DIAGNOSIS — R0602 Shortness of breath: Secondary | ICD-10-CM | POA: Diagnosis present

## 2022-01-18 DIAGNOSIS — Z8249 Family history of ischemic heart disease and other diseases of the circulatory system: Secondary | ICD-10-CM | POA: Diagnosis not present

## 2022-01-18 DIAGNOSIS — I2510 Atherosclerotic heart disease of native coronary artery without angina pectoris: Secondary | ICD-10-CM | POA: Diagnosis not present

## 2022-01-18 DIAGNOSIS — I252 Old myocardial infarction: Secondary | ICD-10-CM | POA: Diagnosis present

## 2022-01-18 DIAGNOSIS — I214 Non-ST elevation (NSTEMI) myocardial infarction: Secondary | ICD-10-CM | POA: Diagnosis not present

## 2022-01-18 DIAGNOSIS — I160 Hypertensive urgency: Secondary | ICD-10-CM

## 2022-01-18 DIAGNOSIS — E7800 Pure hypercholesterolemia, unspecified: Secondary | ICD-10-CM

## 2022-01-18 DIAGNOSIS — I161 Hypertensive emergency: Secondary | ICD-10-CM | POA: Diagnosis not present

## 2022-01-18 DIAGNOSIS — B20 Human immunodeficiency virus [HIV] disease: Secondary | ICD-10-CM | POA: Diagnosis present

## 2022-01-18 DIAGNOSIS — B182 Chronic viral hepatitis C: Secondary | ICD-10-CM | POA: Diagnosis present

## 2022-01-18 DIAGNOSIS — E785 Hyperlipidemia, unspecified: Secondary | ICD-10-CM | POA: Diagnosis not present

## 2022-01-18 DIAGNOSIS — F17210 Nicotine dependence, cigarettes, uncomplicated: Secondary | ICD-10-CM | POA: Diagnosis not present

## 2022-01-18 DIAGNOSIS — I10 Essential (primary) hypertension: Secondary | ICD-10-CM | POA: Diagnosis not present

## 2022-01-18 DIAGNOSIS — Z79899 Other long term (current) drug therapy: Secondary | ICD-10-CM | POA: Diagnosis not present

## 2022-01-18 LAB — COMPREHENSIVE METABOLIC PANEL
ALT: 18 U/L (ref 0–44)
AST: 37 U/L (ref 15–41)
Albumin: 3.1 g/dL — ABNORMAL LOW (ref 3.5–5.0)
Alkaline Phosphatase: 74 U/L (ref 38–126)
Anion gap: 9 (ref 5–15)
BUN: 8 mg/dL (ref 8–23)
CO2: 23 mmol/L (ref 22–32)
Calcium: 8.6 mg/dL — ABNORMAL LOW (ref 8.9–10.3)
Chloride: 108 mmol/L (ref 98–111)
Creatinine, Ser: 1.03 mg/dL (ref 0.61–1.24)
GFR, Estimated: >60 mL/min (ref >60)
Glucose, Bld: 165 mg/dL — ABNORMAL HIGH (ref 70–99)
Potassium: 3.1 mmol/L — ABNORMAL LOW (ref 3.5–5.1)
Sodium: 140 mmol/L (ref 135–145)
Total Bilirubin: 0.5 mg/dL (ref 0.3–1.2)
Total Protein: 6.8 g/dL (ref 6.5–8.1)

## 2022-01-18 LAB — CBC
HCT: 34.9 % — ABNORMAL LOW (ref 39.0–52.0)
Hemoglobin: 12.1 g/dL — ABNORMAL LOW (ref 13.0–17.0)
MCH: 37.8 pg — ABNORMAL HIGH (ref 26.0–34.0)
MCHC: 34.7 g/dL (ref 30.0–36.0)
MCV: 109.1 fL — ABNORMAL HIGH (ref 80.0–100.0)
Platelets: 262 10*3/uL (ref 150–400)
RBC: 3.2 MIL/uL — ABNORMAL LOW (ref 4.22–5.81)
RDW: 13.8 % (ref 11.5–15.5)
WBC: 5.3 10*3/uL (ref 4.0–10.5)
nRBC: 0 % (ref 0.0–0.2)

## 2022-01-18 LAB — HEPARIN LEVEL (UNFRACTIONATED)
Heparin Unfractionated: 0.2 IU/mL — ABNORMAL LOW (ref 0.30–0.70)
Heparin Unfractionated: 0.13 IU/mL — ABNORMAL LOW (ref 0.30–0.70)

## 2022-01-18 LAB — LIPID PANEL
Cholesterol: 200 mg/dL (ref 0–200)
HDL: 28 mg/dL — ABNORMAL LOW (ref >40)
Total CHOL/HDL Ratio: 7.1 RATIO
Triglycerides: 108 mg/dL (ref <150)
VLDL: 22 mg/dL (ref 0–40)

## 2022-01-18 LAB — RAPID URINE DRUG SCREEN, HOSP PERFORMED
Amphetamines: NOT DETECTED
Barbiturates: NOT DETECTED
Benzodiazepines: NOT DETECTED
Cocaine: NOT DETECTED
Opiates: NOT DETECTED
Tetrahydrocannabinol: NOT DETECTED

## 2022-01-18 LAB — HEMOGLOBIN A1C
Hgb A1c MFr Bld: 5.7 % — ABNORMAL HIGH (ref 4.8–5.6)
Mean Plasma Glucose: 116.89 mg/dL

## 2022-01-18 LAB — TROPONIN I (HIGH SENSITIVITY)
Troponin I (High Sensitivity): 1558 ng/L (ref <18)
Troponin I (High Sensitivity): 2042 ng/L (ref <18)

## 2022-01-18 LAB — MAGNESIUM: Magnesium: 1.9 mg/dL (ref 1.7–2.4)

## 2022-01-18 LAB — TSH: TSH: 0.833 u[IU]/mL (ref 0.350–4.500)

## 2022-01-18 MED ORDER — POTASSIUM CHLORIDE CRYS ER 20 MEQ PO TBCR
40.0000 meq | EXTENDED_RELEASE_TABLET | Freq: Once | ORAL | Status: AC
  Administered 2022-01-18: 40 meq via ORAL
  Filled 2022-01-18: qty 2

## 2022-01-18 MED ORDER — AMLODIPINE BESYLATE 5 MG PO TABS
5.0000 mg | ORAL_TABLET | Freq: Every day | ORAL | Status: DC
  Administered 2022-01-18 – 2022-01-20 (×2): 5 mg via ORAL
  Filled 2022-01-18 (×2): qty 1

## 2022-01-18 MED ORDER — ASPIRIN 300 MG RE SUPP: 300.0000 mg | RECTAL | Status: AC

## 2022-01-18 MED ORDER — HEPARIN (PORCINE) 25000 UT/250ML-% IV SOLN: 12.0000 [IU]/kg/h | INTRAVENOUS | Status: DC

## 2022-01-18 MED ORDER — HEPARIN (PORCINE) 25000 UT/250ML-% IV SOLN
1300.0000 [IU]/h | INTRAVENOUS | Status: DC
  Administered 2022-01-18: 1000 [IU]/h via INTRAVENOUS
  Filled 2022-01-18 (×2): qty 250

## 2022-01-18 MED ORDER — HYDRALAZINE HCL 25 MG PO TABS
25.0000 mg | ORAL_TABLET | Freq: Three times a day (TID) | ORAL | Status: DC
  Administered 2022-01-18 – 2022-01-20 (×7): 25 mg via ORAL
  Filled 2022-01-18 (×7): qty 1

## 2022-01-18 MED ORDER — NITROGLYCERIN IN D5W 200-5 MCG/ML-% IV SOLN
2.0000 ug/min | INTRAVENOUS | Status: DC
  Administered 2022-01-18: 5 ug/min via INTRAVENOUS
  Filled 2022-01-18: qty 250

## 2022-01-18 MED ORDER — NITROGLYCERIN 0.4 MG SL SUBL: 0.4000 mg | SUBLINGUAL_TABLET | SUBLINGUAL | Status: DC | PRN

## 2022-01-18 MED ORDER — ONDANSETRON HCL 4 MG/2ML IJ SOLN
4.0000 mg | Freq: Four times a day (QID) | INTRAMUSCULAR | Status: DC | PRN
  Administered 2022-01-19: 4 mg via INTRAVENOUS
  Filled 2022-01-18: qty 2

## 2022-01-18 MED ORDER — ACETAMINOPHEN 325 MG PO TABS
650.0000 mg | ORAL_TABLET | ORAL | Status: DC | PRN
  Administered 2022-01-18 – 2022-01-19 (×3): 650 mg via ORAL
  Filled 2022-01-18 (×2): qty 2

## 2022-01-18 MED ORDER — HEPARIN BOLUS VIA INFUSION
2000.0000 [IU] | Freq: Once | INTRAVENOUS | Status: AC
  Administered 2022-01-18: 2000 [IU] via INTRAVENOUS
  Filled 2022-01-18: qty 2000

## 2022-01-18 MED ORDER — CARVEDILOL 6.25 MG PO TABS
6.2500 mg | ORAL_TABLET | Freq: Two times a day (BID) | ORAL | Status: DC
  Administered 2022-01-18 – 2022-01-20 (×4): 6.25 mg via ORAL
  Filled 2022-01-18: qty 2
  Filled 2022-01-18 (×2): qty 1
  Filled 2022-01-18: qty 2

## 2022-01-18 MED ORDER — HEPARIN BOLUS VIA INFUSION
4000.0000 [IU] | Freq: Once | INTRAVENOUS | Status: AC
  Administered 2022-01-18: 4000 [IU] via INTRAVENOUS
  Filled 2022-01-18: qty 4000

## 2022-01-18 MED ORDER — ASPIRIN 81 MG PO TBEC
81.0000 mg | DELAYED_RELEASE_TABLET | Freq: Every day | ORAL | Status: DC
  Administered 2022-01-20: 81 mg via ORAL
  Filled 2022-01-18: qty 1

## 2022-01-18 MED ORDER — ABACAVIR-DOLUTEGRAVIR-LAMIVUD 600-50-300 MG PO TABS
1.0000 | ORAL_TABLET | Freq: Every day | ORAL | Status: DC
  Administered 2022-01-18 – 2022-01-20 (×2): 1 via ORAL
  Filled 2022-01-18 (×3): qty 1

## 2022-01-18 MED ORDER — NITROGLYCERIN 2 % TD OINT
1.0000 [in_us] | TOPICAL_OINTMENT | Freq: Once | TRANSDERMAL | Status: AC
  Administered 2022-01-18: 1 [in_us] via TOPICAL
  Filled 2022-01-18: qty 1

## 2022-01-18 MED ORDER — ASPIRIN 81 MG PO CHEW: 324.0000 mg | CHEWABLE_TABLET | ORAL | Status: AC

## 2022-01-18 MED ORDER — NITROGLYCERIN IN D5W 200-5 MCG/ML-% IV SOLN: 0.0000 ug/min | INTRAVENOUS | Status: DC

## 2022-01-18 MED ORDER — ATORVASTATIN CALCIUM 80 MG PO TABS
80.0000 mg | ORAL_TABLET | Freq: Every day | ORAL | Status: DC
  Administered 2022-01-18 – 2022-01-20 (×2): 80 mg via ORAL
  Filled 2022-01-18: qty 2
  Filled 2022-01-18: qty 1

## 2022-01-18 MED ORDER — IRBESARTAN 150 MG PO TABS
300.0000 mg | ORAL_TABLET | Freq: Every day | ORAL | Status: DC
  Administered 2022-01-18 – 2022-01-20 (×2): 300 mg via ORAL
  Filled 2022-01-18: qty 2
  Filled 2022-01-18: qty 1

## 2022-01-18 MED ORDER — ABACAVIR-DOLUTEGRAVIR-LAMIVUD 600-50-300 MG PO TABS: 1.0000 | ORAL_TABLET | Freq: Every day | ORAL | Status: DC

## 2022-01-18 MED ORDER — ASPIRIN 81 MG PO CHEW
324.0000 mg | CHEWABLE_TABLET | Freq: Once | ORAL | Status: AC
  Administered 2022-01-18: 324 mg via ORAL
  Filled 2022-01-18: qty 4

## 2022-01-18 NOTE — ED Provider Notes (Signed)
[facility] EMERGENCY DEPARTMENT Provider Note   CSN: [id number] Arrival date & time: 01/17/22  2006     History  Chief Complaint  Patient presents with   Chest Pain    Jamie Clark is a 71 y.o. male.  The history is provided by the patient.  Chest Pain Pain location:  Substernal area Pain quality: dull   Pain radiates to:  Neck, L jaw, R jaw, L arm and R arm Pain severity:  Severe Onset quality:  Gradual Duration:  2 days Timing:  Intermittent Progression:  Waxing and waning Chronicity:  New Relieved by:  Nothing Worsened by:  Nothing Ineffective treatments:  None tried Associated symptoms: diaphoresis, nausea, shortness of breath and vomiting   Patient with HTN and cholesterol, not on medication at this time presents with chest pain since Thursday.  COmes and goes with episodes Thursday and Friday lasting 15 minutes.  Saturday episodes were longer.  Recently patient has encountered more SOB with ambulation and stairs.       Home Medications Prior to Admission medications   Medication Sig Start Date End Date Taking? Authorizing Provider  acetaminophen (TYLENOL) 500 MG tablet Take 2 tablets (1,000 mg total) by mouth every 8 (eight) hours as needed. 11/09/19  Yes Darr, Gerilyn Pilgrim, PA-C  buPROPion (WELLBUTRIN XL) 150 MG 24 hr tablet TAKE 1 TABLET(150 MG) BY MOUTH DAILY 10/27/21  Yes Nafziger, Kandee Keen, NP  finasteride (PROSCAR) 5 MG tablet Take 1 tablet (5 mg total) by mouth daily. 08/26/21  Yes Nafziger, Kandee Keen, NP  hydrALAZINE (APRESOLINE) 25 MG tablet Take 1 tablet (25 mg total) by mouth 3 (three) times daily. 11/21/21 02/19/22 Yes Nafziger, Kandee Keen, NP  ibuprofen (ADVIL) 600 MG tablet Take 1 tablet (600 mg total) by mouth every 6 (six) hours as needed. 10/01/21  Yes Ellsworth Lennox, PA-C  lidocaine (XYLOCAINE) 2 % solution Use as directed 15 mLs in the mouth or throat as needed for mouth pain. 11/24/21  Yes Rising, Lurena Joiner, PA-C  methocarbamol (ROBAXIN) 500 MG tablet Take  1 tablet (500 mg total) by mouth 2 (two) times daily. Patient taking differently: Take 500 mg by mouth daily as needed for muscle spasms. 07/07/21  Yes Madelyn Brunner, DO  Olmesartan-amLODIPine-HCTZ 40-10-25 MG TABS Take 1 tablet by mouth daily. 08/21/21  Yes Nafziger, Kandee Keen, NP  traZODone (DESYREL) 50 MG tablet Take 0.5 tablets (25 mg total) by mouth at bedtime. 01/09/22 04/09/22 Yes Nafziger, Kandee Keen, NP  abacavir-dolutegravir-lamiVUDine (TRIUMEQ) 600-50-300 MG tablet Take 1 tablet by mouth daily. 12/18/21   Blanchard Kelch, NP  atorvastatin (LIPITOR) 10 MG tablet Take 1 tablet (10 MG total) by MOUTH DAILY. Patient not taking: Reported on 01/18/2022 10/28/21   Shirline Frees, NP  tamsulosin (FLOMAX) 0.4 MG CAPS capsule Take 0.8 mg by mouth at bedtime. Patient not taking: Reported on 01/18/2022 12/05/20   [provider]      Allergies    Patient has no known allergies.    Review of Systems   Review of Systems  Constitutional:  Positive for diaphoresis.  HENT:  Negative for facial swelling.   Eyes:  Negative for redness.  Respiratory:  Positive for shortness of breath.   Cardiovascular:  Positive for chest pain.  Gastrointestinal:  Positive for nausea and vomiting.  All other systems reviewed and are negative.   Physical Exam Updated Vital Signs BP (!) 184/111 (BP Location: Right Arm)   Pulse 79   Temp 98.1 F (36.7 C)   Resp 15   Ht  5\' 7"  (1.702 m)   Wt 81.6 kg   SpO2 97%   BMI 28.19 kg/m  Physical Exam Vitals and nursing note reviewed.  Constitutional:      General: He is not in acute distress.    Appearance: He is well-developed. He is not diaphoretic.  HENT:     Head: Normocephalic and atraumatic.     Nose: Nose normal.  Eyes:     Conjunctiva/sclera: Conjunctivae normal.     Pupils: Pupils are equal, round, and reactive to light.  Cardiovascular:     Rate and Rhythm: Normal rate and regular rhythm.     Pulses: Normal pulses.     Heart sounds: Normal heart sounds.   Pulmonary:     Effort: Pulmonary effort is normal.     Breath sounds: Normal breath sounds. No wheezing or rales.  Abdominal:     General: Bowel sounds are normal.     Palpations: Abdomen is soft.     Tenderness: There is no abdominal tenderness. There is no guarding or rebound.  Musculoskeletal:        General: Normal range of motion.     Cervical back: Normal range of motion and neck supple.  Skin:    General: Skin is warm and dry.     Capillary Refill: Capillary refill takes less than 2 seconds.  Neurological:     General: No focal deficit present.     Mental Status: He is alert and oriented to person, place, and time.     Deep Tendon Reflexes: Reflexes normal.  Psychiatric:        Mood and Affect: Mood normal.        Behavior: Behavior normal.     ED Results / Procedures / Treatments   Labs (all labs ordered are listed, but only abnormal results are displayed) Results for orders placed or performed during the hospital encounter of 01/17/22  Basic metabolic panel  Result Value Ref Range   Sodium 138 135 - 145 mmol/L   Potassium 3.2 (L) 3.5 - 5.1 mmol/L   Chloride 106 98 - 111 mmol/L   CO2 23 22 - 32 mmol/L   Glucose, Bld 115 (H) 70 - 99 mg/dL   BUN 9 8 - 23 mg/dL   Creatinine, Ser 01/19/22 0.61 - 1.24 mg/dL   Calcium 8.9 8.9 - 7.59 mg/dL   GFR, Estimated 16.3 >84 mL/min   Anion gap 9 5 - 15  CBC  Result Value Ref Range   WBC 5.1 4.0 - 10.5 K/uL   RBC 3.50 (L) 4.22 - 5.81 MIL/uL   Hemoglobin 13.1 13.0 - 17.0 g/dL   HCT >66 (L) 59.9 - 35.7 %   MCV 108.3 (H) 80.0 - 100.0 fL   MCH 37.4 (H) 26.0 - 34.0 pg   MCHC 34.6 30.0 - 36.0 g/dL   RDW 01.7 79.3 - 90.3 %   Platelets 299 150 - 400 K/uL   nRBC 0.0 0.0 - 0.2 %  Troponin I (High Sensitivity)  Result Value Ref Range   Troponin I (High Sensitivity) 721 (HH) <18 ng/L  Troponin I (High Sensitivity)  Result Value Ref Range   Troponin I (High Sensitivity) 1,558 (HH) <18 ng/L   DG Chest Port 1 View  Result Date:  01/17/2022 CLINICAL DATA:  Chest pain EXAM: PORTABLE CHEST 1 VIEW COMPARISON:  Chest x-ray 09/12/2017 FINDINGS: The heart size and mediastinal contours are within normal limits. There is linear atelectasis in the lung bases. Lungs are otherwise clear.  No pleural effusion or pneumothorax. Visualized skeletal structures are unremarkable. IMPRESSION: Bibasilar atelectasis. Electronically Signed   By: Ronney Asters M.D.   On: 01/17/2022 21:15      EKG EKG Interpretation  Date/Time:  Saturday January 17 2022 20:23:58 EDT Ventricular Rate:  80 PR Interval:  176 QRS Duration: 92 QT Interval:  420 QTC Calculation: 484 R Axis:   26 Text Interpretation: Normal sinus rhythm Anterior infarct , age undetermined T wave abnormality, consider lateral ischemia Confirmed by Randal Buba, Trella Thurmond ([id number]) on 01/18/2022 12:57:16 AM  Radiology DG Chest Port 1 View  Result Date: 01/17/2022 CLINICAL DATA:  Chest pain EXAM: PORTABLE CHEST 1 VIEW COMPARISON:  Chest x-ray 09/12/2017 FINDINGS: The heart size and mediastinal contours are within normal limits. There is linear atelectasis in the lung bases. Lungs are otherwise clear. No pleural effusion or pneumothorax. Visualized skeletal structures are unremarkable. IMPRESSION: Bibasilar atelectasis. Electronically Signed   By: Ronney Asters M.D.   On: 01/17/2022 21:15    Procedures Procedures    Medications Ordered in ED Medications  heparin ADULT infusion 100 units/mL (25000 units/23mL) (1,000 Units/hr Intravenous New Bag/Given 01/18/22 0218)  aspirin chewable tablet 324 mg (324 mg Oral Given 01/18/22 0200)  heparin bolus via infusion 4,000 Units (4,000 Units Intravenous Bolus from Bag 01/18/22 0218)  nitroGLYCERIN (NITROGLYN) 2 % ointment 1 inch (1 inch Topical Given 01/18/22 0201)    ED Course/ Medical Decision Making/ A&P                           Medical Decision Making CP with radiation and SOB and n/v/d.   Amount and/or Complexity of Data  Reviewed External Data Reviewed: notes.    Details: Previous notes reviewed  Labs: ordered.    Details: All labs reviewed:  normal sodium 138, low potassium 3.3 normal creatinine.  Normal white count 5.1, normal hemoglobin 13.1  and normal platelet count. Elkevated troponins 721 and 1558  Radiology: ordered and independent interpretation performed.    Details: Negative CXR by me  ECG/medicine tests: ordered and independent interpretation performed. Decision-making details documented in ED Course. Discussion of management or test interpretation with external provider(s): Case d/w Dr. Radford Pax of cardiology who will seen the patient  Risk OTC drugs. Prescription drug management. Decision regarding hospitalization.  Critical Care Total time providing critical care: 60 minutes (Heparin drip consults )   CRITICAL CARE Performed by: Toshua Honsinger K Marcellus Pulliam-Rasch Total critical care time: 60 minutes Critical care time was exclusive of separately billable procedures and treating other patients. Critical care was necessary to treat or prevent imminent or life-threatening deterioration. Critical care was time spent personally by me on the following activities: development of treatment plan with patient and/or surrogate as well as nursing, discussions with consultants, evaluation of patient's response to treatment, examination of patient, obtaining history from patient or surrogate, ordering and performing treatments and interventions, ordering and review of laboratory studies, ordering and review of radiographic studies, pulse oximetry and re-evaluation of patient's condition.  Final Clinical Impression(s) / ED Diagnoses Final diagnoses:  NSTEMI (non-ST elevated myocardial infarction) [facility])   The patient appears reasonably stabilized for admission considering the current resources, flow, and capabilities available in the ED at this time, and I doubt any other [facility] requiring further screening and/or  treatment in the ED prior to admission.      Keil Pickering, MD 01/18/22 [id number]

## 2022-01-18 NOTE — Plan of Care (Signed)
Personally seen and examined. Seen by Dr. Radford Pax this AM.  Briefly 71 yo M with refractory HTN, HLD, and HIV and Tobacco abuse.   Found to hae NSTEMI.  Patient notes that his CP is going on Nitro drip but has headache. Cocaine negative.  Troponin continues to climb (2042) SR on Teleemtry.  Exam notable for +2 radial and femoral pulses.  No murmur.  Poor dentition. Had CT aorta in 2018 with mild PAD; if no significant changes no concerns of femoral access.  Would recommend  - added coreg 6.25 mg PO BID - Echo is pending - on ASA 81 mg; heparin drip - received Avapro this AM, wIll increase PM hydralazine dose with goal of weaning off nitro drip - Cardiac diet today, NPO at midnight for LHC - continue high dose statin - His triumeq should be re-ordered  Risks and benefits of cardiac catheterization have been discussed with the patient.  These include bleeding, infection, kidney damage, stroke, heart attack, death.  The patient understands these risks and is willing to proceed.  Access recommendations: R radial Procedural considerations no contra-indications for PCI; he appears older than stated age and is not an ideal surgical candidate  Rudean Haskell, MD [place]  [address] [place], [place] [address] [phone]  10:18 AM

## 2022-01-18 NOTE — H&P (Addendum)
Cardiology Admission History and Physical:   Patient ID: Jamie Clark MRN: [id number]; DOB: [birth year]   Admission date: 01/17/2022  Primary Care Provider: Shirline Frees, NP [facility] Cardiologist: None  [facility] Electrophysiologist:  None   Chief Complaint:  Chest pain  Patient Profile:   Jamie Clark is a 71 y.o. male with a hx of HIV, chronic Hep C, depression, Anxiety and HTN who presented to the ER with complaints of chest pain and SOB and found to have an elevated hs Troponin.    History of Present Illness:   Jamie Clark is a 71 y.o. male with a hx of HIV, chronic Hep C, depression, Anxiety and HTN who presented to the ER with complaints of chest pain and SOB and found to have an elevated hs Troponin.  He has been having CP off and on since Thursday with episodes on Thursday and Friday lasting 15 minutes.  Today he had recurrent CP that lasted a longer time and presented to the ER. His CP is associated with diaphoresis, nausea and vomiting as well as SOB.  He describes the pain as sharp mid sternal with no radiation.  He also has been having DOE for weeks. Currently he is pain free.  He denies any back pain.  He did not take his meds the day of admit to ER but says he has been compliant otherwise.  He does smoke about 4 cig daily.  Denies any drug use.    Past Medical History:  Diagnosis Date   Allergy    Anxiety    Depression    Hepatitis C, chronic (HCC)    HIV positive (HCC)    Hypertension     Past Surgical History:  Procedure Laterality Date   NO PAST SURGERIES       Medications Prior to Admission: Prior to Admission medications   Medication Sig Start Date End Date Taking? Authorizing Provider  acetaminophen (TYLENOL) 500 MG tablet Take 2 tablets (1,000 mg total) by mouth every 8 (eight) hours as needed. 11/09/19  Yes Darr, Gerilyn Pilgrim, PA-C  buPROPion (WELLBUTRIN XL) 150 MG 24 hr tablet TAKE 1 TABLET(150 MG) BY MOUTH DAILY 10/27/21  Yes  Nafziger, Kandee Keen, NP  finasteride (PROSCAR) 5 MG tablet Take 1 tablet (5 mg total) by mouth daily. 08/26/21  Yes Nafziger, Kandee Keen, NP  hydrALAZINE (APRESOLINE) 25 MG tablet Take 1 tablet (25 mg total) by mouth 3 (three) times daily. 11/21/21 02/19/22 Yes Nafziger, Kandee Keen, NP  ibuprofen (ADVIL) 600 MG tablet Take 1 tablet (600 mg total) by mouth every 6 (six) hours as needed. 10/01/21  Yes Ellsworth Lennox, PA-C  lidocaine (XYLOCAINE) 2 % solution Use as directed 15 mLs in the mouth or throat as needed for mouth pain. 11/24/21  Yes Rising, Lurena Joiner, PA-C  methocarbamol (ROBAXIN) 500 MG tablet Take 1 tablet (500 mg total) by mouth 2 (two) times daily. Patient taking differently: Take 500 mg by mouth daily as needed for muscle spasms. 07/07/21  Yes Madelyn Brunner, DO  Olmesartan-amLODIPine-HCTZ 40-10-25 MG TABS Take 1 tablet by mouth daily. 08/21/21  Yes Nafziger, Kandee Keen, NP  traZODone (DESYREL) 50 MG tablet Take 0.5 tablets (25 mg total) by mouth at bedtime. 01/09/22 04/09/22 Yes Nafziger, Kandee Keen, NP  abacavir-dolutegravir-lamiVUDine (TRIUMEQ) 600-50-300 MG tablet Take 1 tablet by mouth daily. 12/18/21   Blanchard Kelch, NP  atorvastatin (LIPITOR) 10 MG tablet Take 1 tablet (10 MG total) by MOUTH DAILY. Patient not taking: Reported on 01/18/2022 10/28/21   Shirline Frees, NP  tamsulosin (FLOMAX) 0.4 MG CAPS capsule Take 0.8 mg by mouth at bedtime. Patient not taking: Reported on 01/18/2022 12/05/20   [provider]     Allergies:   No Known Allergies  Social History:   Social History   Socioeconomic History   Marital status: Single    Spouse name: Not on file   Number of children: Not on file   Years of education: Not on file   Highest education level: 12th grade  Occupational History   Not on file  Tobacco Use   Smoking status: Light Smoker    Packs/day: 0.15    Types: Cigarettes   Smokeless tobacco: Never   Tobacco comments:    3 per day, working on quitting  Vaping Use   Vaping Use: Never used   Substance and Sexual Activity   Alcohol use: No    Alcohol/week: 0.0 standard drinks of alcohol   Drug use: No   Sexual activity: Not on file    Comment: given condoms 05/2020  Other Topics Concern   Not on file  Social History Narrative   Not on file   Social Determinants of Health   Financial Resource Strain: Medium Risk (11/11/2021)   Overall Financial Resource Strain (CARDIA)    Difficulty of Paying Living Expenses: Somewhat hard  Food Insecurity: No Food Insecurity (01/09/2022)   Hunger Vital Sign    Worried About Running Out of Food in the Last Year: Never true    Ran Out of Food in the Last Year: Never true  Recent Concern: Food Insecurity - Food Insecurity Present (11/11/2021)   Hunger Vital Sign    Worried About Running Out of Food in the Last Year: Sometimes true    Ran Out of Food in the Last Year: Often true  Transportation Needs: No Transportation Needs (01/09/2022)   PRAPARE - Hydrologist (Medical): No    Lack of Transportation (Non-Medical): No  Physical Activity: Insufficiently Active (11/11/2021)   Exercise Vital Sign    Days of Exercise per Week: 3 days    Minutes of Exercise per Session: 30 min  Stress: No Stress Concern Present (11/11/2021)   [place]    Feeling of Stress : Only a little  Social Connections: Moderately Integrated (11/11/2021)   Social Connection and Isolation Panel [NHANES]    Frequency of Communication with Friends and Family: Twice a week    Frequency of Social Gatherings with Friends and Family: Twice a week    Attends Religious Services: More than 4 times per year    Active Member of [employer] or Organizations: Yes    Attends Archivist Meetings: 1 to 4 times per year    Marital Status: Divorced  Intimate Partner Violence: Not At Risk (03/26/2020)   Humiliation, Afraid, Rape, and Kick questionnaire    Fear of Current or Ex-Partner: No     Emotionally Abused: No    Physically Abused: No    Sexually Abused: No    Family History:   The patient's family history includes Cancer in his father and mother; Esophageal cancer in his mother; Hypertension in his father and mother. There is no history of Colon cancer, Colon polyps, Rectal cancer, or Stomach cancer.    ROS:  Please see the history of present illness.  All other ROS reviewed and negative.     Physical Exam/Data:   Vitals:   01/18/22 0200 01/18/22 0215 01/18/22 0230  01/18/22 0245  BP: (!) 174/107 (!) 170/105 (!) 174/108 (!) 184/111  Pulse: 74 74 75 79  Resp: 15 17 12 15   Temp:      SpO2: 96% 97% 94% 97%  Weight:      Height:       No intake or output data in the 24 hours ending 01/18/22 0313    01/17/2022    8:23 PM 11/21/2021    3:10 PM 10/23/2021   11:26 AM  Last 3 Weights  Weight (lbs) 180 lb 179 lb 182 lb  Weight (kg) 81.647 kg 81.194 kg 82.555 kg     Body mass index is 28.19 kg/m.  General:  Well nourished, well developed, in no acute distress HEENT: normal Lymph: no adenopathy Neck: no JVD Endocrine:  No thryomegaly Vascular: No carotid bruits; FA pulses 2+ bilaterally without bruits  Cardiac:  normal S1, S2; RRR; no murmur  Lungs:  clear to auscultation bilaterally, no wheezing, rhonchi or rales  Abd: soft, nontender, no hepatomegaly  Ext: no edema Musculoskeletal:  No deformities, BUE and BLE strength normal and equal Skin: warm and dry  Neuro:  CNs 2-12 intact, no focal abnormalities noted Psych:  Normal affect    EKG:  The ECG that was done today was personally reviewed and demonstrates NSR with lateral ischemia  Relevant CV Studies: none  Laboratory Data:  High Sensitivity Troponin:   Recent Labs  Lab 01/17/22 2038 01/17/22 2259  TROPONINIHS 721* 1,558*      Chemistry Recent Labs  Lab 01/17/22 2038  NA 138  K 3.2*  CL 106  CO2 23  GLUCOSE 115*  BUN 9  CREATININE 0.97  CALCIUM 8.9  GFRNONAA >60  ANIONGAP 9     No results for input(s): "PROT", "ALBUMIN", "AST", "ALT", "ALKPHOS", "BILITOT" in the last 168 hours. Hematology Recent Labs  Lab 01/17/22 2038  WBC 5.1  RBC 3.50*  HGB 13.1  HCT 37.9*  MCV 108.3*  MCH 37.4*  MCHC 34.6  RDW 13.6  PLT 299   BNPNo results for input(s): "BNP", "PROBNP" in the last 168 hours.  DDimer No results for input(s): "DDIMER" in the last 168 hours.   Radiology/Studies:  DG Chest Port 1 View  Result Date: 01/17/2022 CLINICAL DATA:  Chest pain EXAM: PORTABLE CHEST 1 VIEW COMPARISON:  Chest x-ray 09/12/2017 FINDINGS: The heart size and mediastinal contours are within normal limits. There is linear atelectasis in the lung bases. Lungs are otherwise clear. No pleural effusion or pneumothorax. Visualized skeletal structures are unremarkable. IMPRESSION: Bibasilar atelectasis. Electronically Signed   By: 09/14/2017 M.D.   On: 01/17/2022 21:15     Assessment and Plan:   NSTEMI -patient has had episodes of intermittent CP for several days and SOB for weeks -hsTrop 721>>1558 -EKG demonstrates lateral ischemia -sx, EKG and labs c/w ACS although this is in the setting of hypertensive urgency so also could be demand ischemia (recently ran out of his meds) -currently pain free on NTPaste and IV Heparin -admit to tele bed -cycle hsTrop until peak -continue IV Heparin gtt -change NTPaste to IV NTG gtt and titrate for Bp -add ASA 81mg  daily -check urine drug screen and if no cocaine then start Lopressor 25mg  BID -was on Atorvastatin 10mg  daily at home but ran out>>start Atorvastatin 80mg  daily -will need LHC on Monday  2.  Hypertensive urgency -BP elevated at 184/159mmHg -home meds include Olmesartan-amlodipine/HCTZ 40-10-25mg  daily and Hydralazine 25mg  TID -renal function normal -continue Amlodipine 10mg  daily and  Hydralazine 25mg  TID -start Avapro 300mg  daily (in place of Benicar) -hold diuretic for cath on Monday -add low dose BB for NSTEMI if UDS neg    3.  HLD -was on atorvastatin 10mg  daily -check FLP in am -start Atorvastatin 80mg  daily   4.  HIV -continue Triumeq     TIMI Risk Score for Unstable Angina or Non-ST Elevation MI:   The patient's TIMI risk score is 4, which indicates a 20% risk of all cause mortality, new or recurrent myocardial infarction or need for urgent revascularization in the next 14 days.      Severity of Illness: The appropriate patient status for this patient is OBSERVATION. Observation status is judged to be reasonable and necessary in order to provide the required intensity of service to ensure the patient's safety. The patient's presenting symptoms, physical exam findings, and initial radiographic and laboratory data in the context of their medical condition is felt to place them at decreased risk for further clinical deterioration. Furthermore, it is anticipated that the patient will be medically stable for discharge from the hospital within 2 midnights of admission.    For questions or updates, please contact [facility] Please consult [email or web address] for contact info under     Signed, , MD  01/18/2022 3:13 AM

## 2022-01-18 NOTE — Progress Notes (Signed)
ANTICOAGULATION CONSULT NOTE - Initial Consult  Pharmacy Consult for heparin Indication: chest pain/ACS  No Known Allergies  Patient Measurements: Height: 5\' 7"  (170.2 cm) Weight: 81.6 kg (180 lb) IBW/kg (Calculated) : 66.1  Vital Signs: Temp: 98.1 F (36.7 C) (10/29 0030) BP: 179/115 (10/29 0030) Pulse Rate: 78 (10/29 0030)  Labs: Recent Labs    01/17/22 2038 01/17/22 2259  HGB 13.1  --   HCT 37.9*  --   PLT 299  --   CREATININE 0.97  --   TROPONINIHS 721* 1,558*    Estimated Creatinine Clearance: 73.5 mL/min (by C-G formula based on SCr of 0.97 mg/dL).   Medical History: Past Medical History:  Diagnosis Date   Allergy    Anxiety    Depression    Hepatitis C, chronic (HCC)    HIV positive ([place])    Hypertension     Assessment: 71yo male c/o intermittend central CP, improved with NTG x1, troponin initially elevated and now rising >> to begin heparin.  Goal of Therapy:  Heparin level 0.3-0.7 units/ml Monitor platelets by anticoagulation protocol: Yes   Plan:  Heparin 4000 units IV bolus x1 followed by infusion at 1000 units/hr. Monitor heparin levels and CBC.  Wynona Neat, PharmD, BCPS  01/18/2022,1:09 AM

## 2022-01-18 NOTE — Progress Notes (Signed)
ANTICOAGULATION CONSULT NOTE   Pharmacy Consult for heparin Indication: chest pain/ACS  No Known Allergies  Patient Measurements: Height: 5\' 7"  (170.2 cm) Weight: 81.6 kg (180 lb) IBW/kg (Calculated) : 66.1  Vital Signs: Temp: 97.9 F (36.6 C) (10/29 0845) Temp Source: Oral (10/29 0845) BP: 165/99 (10/29 0700) Pulse Rate: 80 (10/29 0700)  Labs: Recent Labs    01/17/22 2038 01/17/22 2259 01/18/22 0529 01/18/22 0912  HGB 13.1  --  12.1*  --   HCT 37.9*  --  34.9*  --   PLT 299  --  262  --   HEPARINUNFRC  --   --   --  0.20*  CREATININE 0.97  --  1.03  --   TROPONINIHS 721* 1,558* 2,042*  --      Estimated Creatinine Clearance: 69.2 mL/min (by C-G formula based on SCr of 1.03 mg/dL).   Medical History: Past Medical History:  Diagnosis Date   Allergy    Anxiety    Depression    Hepatitis C, chronic (HCC)    HIV positive ([place])    Hypertension     Assessment: 71yo male c/o intermittend central CP, improved with NTG x1, troponin initially elevated and now rising >> to begin heparin.  Initial heparin level subtherapeutic on 1000 units/hr, cards plan for cath monday  Goal of Therapy:  Heparin level 0.3-0.7 units/ml Monitor platelets by anticoagulation protocol: Yes   Plan:  Increase heparin gtt to 1150 units/hr F/u 6 hour heparin level  Bertis Ruddy, PharmD Clinical Pharmacist ED Pharmacist Phone # [phone] 01/18/2022 9:44 AM

## 2022-01-18 NOTE — Progress Notes (Signed)
ANTICOAGULATION CONSULT NOTE   Pharmacy Consult for heparin Indication: chest pain/ACS  No Known Allergies  Patient Measurements: Height: 5\' 7"  (170.2 cm) Weight: 81.6 kg (180 lb) IBW/kg (Calculated) : 66.1  Vital Signs: Temp: 97.8 F (36.6 C) (10/29 1657) Temp Source: Oral (10/29 1657) BP: 143/90 (10/29 1700) Pulse Rate: 73 (10/29 1700)  Labs: Recent Labs    01/17/22 2038 01/17/22 2259 01/18/22 0529 01/18/22 0912 01/18/22 1632  HGB 13.1  --  12.1*  --   --   HCT 37.9*  --  34.9*  --   --   PLT 299  --  262  --   --   HEPARINUNFRC  --   --   --  0.20* 0.13*  CREATININE 0.97  --  1.03  --   --   TROPONINIHS 721* 1,558* 2,042*  --   --      Estimated Creatinine Clearance: 69.2 mL/min (by C-G formula based on SCr of 1.03 mg/dL).   Medical History: Past Medical History:  Diagnosis Date   Allergy    Anxiety    Depression    Hepatitis C, chronic (HCC)    HIV positive ([place])    Hypertension     Assessment: 71yo male c/o intermittend central CP, improved with NTG x1, troponin initially elevated and now rising >> to begin heparin.  Heparin level subtherapeutic s/p rate increase to 1150 units/hr, no infusion issues or interruptions   Goal of Therapy:  Heparin level 0.3-0.7 units/ml Monitor platelets by anticoagulation protocol: Yes   Plan:  Heparin 2000 units IV x 1, and increase gtt rate to 1300 units/hr F/u 6 hour heparin level  Bertis Ruddy, PharmD Clinical Pharmacist ED Pharmacist Phone # [phone] 01/18/2022 5:41 PM

## 2022-01-18 NOTE — ED Notes (Signed)
Pt switched to hospital bed for comfort.  Pt's family at bedside, updated on plan of care, all questions answered.

## 2022-01-18 NOTE — ED Notes (Signed)
RN ask for clarification concerning IV NTG BP parameters and if provider wants PO hydralazine given while pt on nitro, per Melvyn Neth (PA-C) if patient is not having chest pain the IV NTG can remain at the current dose and PO hydralazine can be given while pt on nitro.

## 2022-01-19 ENCOUNTER — Encounter (HOSPITAL_COMMUNITY)
Admission: EM | Disposition: A | Discharge status: Home / Self Care | Payer: Self-pay | Source: Home / Self Care | Attending: Internal Medicine

## 2022-01-19 ENCOUNTER — Inpatient Hospital Stay (HOSPITAL_COMMUNITY): Payer: Medicare Other

## 2022-01-19 DIAGNOSIS — I2510 Atherosclerotic heart disease of native coronary artery without angina pectoris: Secondary | ICD-10-CM

## 2022-01-19 DIAGNOSIS — I161 Hypertensive emergency: Secondary | ICD-10-CM

## 2022-01-19 DIAGNOSIS — E785 Hyperlipidemia, unspecified: Secondary | ICD-10-CM | POA: Diagnosis not present

## 2022-01-19 DIAGNOSIS — I214 Non-ST elevation (NSTEMI) myocardial infarction: Secondary | ICD-10-CM | POA: Diagnosis not present

## 2022-01-19 HISTORY — PX: LEFT HEART CATH AND CORONARY ANGIOGRAPHY: CATH118249

## 2022-01-19 HISTORY — PX: CORONARY STENT INTERVENTION: CATH118234

## 2022-01-19 LAB — BASIC METABOLIC PANEL
Anion gap: 5 (ref 5–15)
BUN: 11 mg/dL (ref 8–23)
CO2: 20 mmol/L — ABNORMAL LOW (ref 22–32)
Calcium: 7.1 mg/dL — ABNORMAL LOW (ref 8.9–10.3)
Chloride: 113 mmol/L — ABNORMAL HIGH (ref 98–111)
Creatinine, Ser: 0.96 mg/dL (ref 0.61–1.24)
GFR, Estimated: >60 mL/min (ref >60)
Glucose, Bld: 137 mg/dL — ABNORMAL HIGH (ref 70–99)
Potassium: 2.9 mmol/L — ABNORMAL LOW (ref 3.5–5.1)
Sodium: 138 mmol/L (ref 135–145)

## 2022-01-19 LAB — POCT ACTIVATED CLOTTING TIME
Activated Clotting Time: 257 seconds
Activated Clotting Time: 269 seconds
Activated Clotting Time: 233 seconds
Activated Clotting Time: 215 seconds
Activated Clotting Time: 197 seconds
Activated Clotting Time: 185 seconds
Activated Clotting Time: 191 seconds

## 2022-01-19 LAB — CBC
HCT: 31.7 % — ABNORMAL LOW (ref 39.0–52.0)
Hemoglobin: 10.9 g/dL — ABNORMAL LOW (ref 13.0–17.0)
MCH: 37.3 pg — ABNORMAL HIGH (ref 26.0–34.0)
MCHC: 34.4 g/dL (ref 30.0–36.0)
MCV: 108.6 fL — ABNORMAL HIGH (ref 80.0–100.0)
Platelets: 266 10*3/uL (ref 150–400)
RBC: 2.92 MIL/uL — ABNORMAL LOW (ref 4.22–5.81)
RDW: 14 % (ref 11.5–15.5)
WBC: 5.9 10*3/uL (ref 4.0–10.5)
nRBC: 0 % (ref 0.0–0.2)

## 2022-01-19 LAB — LIPOPROTEIN A (LPA): Lipoprotein (a): 147.2 nmol/L — ABNORMAL HIGH

## 2022-01-19 LAB — HEPARIN LEVEL (UNFRACTIONATED)
Heparin Unfractionated: 0.39 IU/mL (ref 0.30–0.70)
Heparin Unfractionated: 0.45 IU/mL (ref 0.30–0.70)

## 2022-01-19 SURGERY — LEFT HEART CATH AND CORONARY ANGIOGRAPHY
Anesthesia: LOCAL

## 2022-01-19 MED ORDER — PRASUGREL HCL 10 MG PO TABS
ORAL_TABLET | ORAL | Status: AC
  Filled 2022-01-19: qty 1

## 2022-01-19 MED ORDER — SODIUM CHLORIDE 0.9 % WEIGHT BASED INFUSION
1.0000 mL/kg/h | INTRAVENOUS | Status: DC
  Administered 2022-01-19: 1 mL/kg/h via INTRAVENOUS

## 2022-01-19 MED ORDER — SODIUM CHLORIDE 0.9 % IV SOLN: 250.0000 mL | INTRAVENOUS | Status: DC | PRN

## 2022-01-19 MED ORDER — HYDRALAZINE HCL 20 MG/ML IJ SOLN: 10.0000 mg | INTRAMUSCULAR | Status: AC | PRN

## 2022-01-19 MED ORDER — HEPARIN SODIUM (PORCINE) 1000 UNIT/ML IJ SOLN
INTRAMUSCULAR | Status: AC
  Filled 2022-01-19: qty 10

## 2022-01-19 MED ORDER — PRASUGREL HCL 10 MG PO TABS
10.0000 mg | ORAL_TABLET | Freq: Every day | ORAL | Status: DC
  Administered 2022-01-20: 10 mg via ORAL
  Filled 2022-01-19: qty 1

## 2022-01-19 MED ORDER — SODIUM CHLORIDE 0.9% FLUSH: 3.0000 mL | INTRAVENOUS | Status: DC | PRN

## 2022-01-19 MED ORDER — ENOXAPARIN SODIUM 40 MG/0.4ML IJ SOSY
40.0000 mg | PREFILLED_SYRINGE | INTRAMUSCULAR | Status: DC
  Administered 2022-01-20: 40 mg via SUBCUTANEOUS
  Filled 2022-01-19: qty 0.4

## 2022-01-19 MED ORDER — ACETAMINOPHEN 325 MG PO TABS
ORAL_TABLET | ORAL | Status: AC
  Filled 2022-01-19: qty 2

## 2022-01-19 MED ORDER — SODIUM CHLORIDE 0.9 % WEIGHT BASED INFUSION: 3.0000 mL/kg/h | INTRAVENOUS | Status: DC

## 2022-01-19 MED ORDER — LABETALOL HCL 5 MG/ML IV SOLN: 10.0000 mg | INTRAVENOUS | Status: AC | PRN

## 2022-01-19 MED ORDER — LIDOCAINE HCL (PF) 1 % IJ SOLN
INTRAMUSCULAR | Status: AC
  Filled 2022-01-19: qty 30

## 2022-01-19 MED ORDER — PRASUGREL HCL 10 MG PO TABS
ORAL_TABLET | ORAL | Status: DC | PRN
  Administered 2022-01-19: 60 mg via ORAL

## 2022-01-19 MED ORDER — HEPARIN (PORCINE) IN NACL 1000-0.9 UT/500ML-% IV SOLN
INTRAVENOUS | Status: AC
  Filled 2022-01-19: qty 1000

## 2022-01-19 MED ORDER — SODIUM CHLORIDE 0.9% FLUSH: 3.0000 mL | Freq: Two times a day (BID) | INTRAVENOUS | Status: DC

## 2022-01-19 MED ORDER — FENTANYL CITRATE (PF) 100 MCG/2ML IJ SOLN
INTRAMUSCULAR | Status: AC
  Filled 2022-01-19: qty 2

## 2022-01-19 MED ORDER — SODIUM CHLORIDE 0.9 % WEIGHT BASED INFUSION
3.0000 mL/kg/h | INTRAVENOUS | Status: DC
  Administered 2022-01-19: 3 mL/kg/h via INTRAVENOUS

## 2022-01-19 MED ORDER — HEPARIN SODIUM (PORCINE) 1000 UNIT/ML IJ SOLN
INTRAMUSCULAR | Status: DC | PRN
  Administered 2022-01-19: 4000 [IU] via INTRAVENOUS
  Administered 2022-01-19 (×2): 2000 [IU] via INTRAVENOUS
  Administered 2022-01-19: 4000 [IU] via INTRAVENOUS

## 2022-01-19 MED ORDER — MIDAZOLAM HCL 2 MG/2ML IJ SOLN
INTRAMUSCULAR | Status: DC | PRN
  Administered 2022-01-19: 1 mg via INTRAVENOUS

## 2022-01-19 MED ORDER — ASPIRIN 81 MG PO CHEW
81.0000 mg | CHEWABLE_TABLET | ORAL | Status: AC
  Administered 2022-01-19: 81 mg via ORAL
  Filled 2022-01-19: qty 1

## 2022-01-19 MED ORDER — FENTANYL CITRATE (PF) 100 MCG/2ML IJ SOLN
INTRAMUSCULAR | Status: DC | PRN
  Administered 2022-01-19 (×2): 25 ug via INTRAVENOUS

## 2022-01-19 MED ORDER — TRAZODONE HCL 50 MG PO TABS
25.0000 mg | ORAL_TABLET | Freq: Every day | ORAL | Status: DC
  Administered 2022-01-19: 25 mg via ORAL
  Filled 2022-01-19: qty 1

## 2022-01-19 MED ORDER — SODIUM CHLORIDE 0.9 % IV SOLN: INTRAVENOUS | Status: AC

## 2022-01-19 MED ORDER — MIDAZOLAM HCL 2 MG/2ML IJ SOLN
INTRAMUSCULAR | Status: AC
  Filled 2022-01-19: qty 2

## 2022-01-19 MED ORDER — IOHEXOL 350 MG/ML SOLN
INTRAVENOUS | Status: DC | PRN
  Administered 2022-01-19: 145 mL

## 2022-01-19 MED ORDER — ASPIRIN 81 MG PO CHEW: 81.0000 mg | CHEWABLE_TABLET | ORAL | Status: DC

## 2022-01-19 MED ORDER — LIDOCAINE HCL (PF) 1 % IJ SOLN
INTRAMUSCULAR | Status: DC | PRN
  Administered 2022-01-19: 2 mL
  Administered 2022-01-19: 20 mL

## 2022-01-19 MED ORDER — VERAPAMIL HCL 2.5 MG/ML IV SOLN
INTRAVENOUS | Status: DC | PRN
  Administered 2022-01-19: 10 mL via INTRA_ARTERIAL

## 2022-01-19 MED ORDER — SODIUM CHLORIDE 0.9% FLUSH
3.0000 mL | Freq: Two times a day (BID) | INTRAVENOUS | Status: DC
  Administered 2022-01-19 – 2022-01-20 (×2): 3 mL via INTRAVENOUS

## 2022-01-19 MED ORDER — VERAPAMIL HCL 2.5 MG/ML IV SOLN
INTRAVENOUS | Status: DC | PRN
  Administered 2022-01-19 (×2): 500 ug via INTRACORONARY

## 2022-01-19 MED ORDER — NITROGLYCERIN 1 MG/10 ML FOR IR/CATH LAB
INTRA_ARTERIAL | Status: DC | PRN
  Administered 2022-01-19: 200 ug via INTRACORONARY

## 2022-01-19 MED ORDER — NITROGLYCERIN IN D5W 200-5 MCG/ML-% IV SOLN
2.0000 ug/min | INTRAVENOUS | Status: DC
  Administered 2022-01-19: 20 ug/min via INTRAVENOUS

## 2022-01-19 MED ORDER — SODIUM CHLORIDE 0.9 % WEIGHT BASED INFUSION: 1.0000 mL/kg/h | INTRAVENOUS | Status: DC

## 2022-01-19 MED ORDER — HEPARIN (PORCINE) IN NACL 1000-0.9 UT/500ML-% IV SOLN
INTRAVENOUS | Status: DC | PRN
  Administered 2022-01-19 (×2): 500 mL

## 2022-01-19 MED ORDER — POTASSIUM CHLORIDE CRYS ER 20 MEQ PO TBCR
40.0000 meq | EXTENDED_RELEASE_TABLET | ORAL | Status: AC
  Administered 2022-01-19: 40 meq via ORAL
  Filled 2022-01-19: qty 2

## 2022-01-19 SURGICAL SUPPLY — 24 items
BALL SAPPHIRE NC24 3.25X12 (BALLOONS) ×1
BALLN SAPPHIRE 2.5X12 (BALLOONS) ×1
BALLOON SAPPHIRE 2.5X12 (BALLOONS) IMPLANT
BALLOON SAPPHIRE NC24 3.25X12 (BALLOONS) IMPLANT
BAND ZEPHYR COMPRESS 30 LONG (HEMOSTASIS) IMPLANT
CATH 5FR JL3.5 JR4 ANG PIG MP (CATHETERS) IMPLANT
CATH INFINITI 5FR JL4 (CATHETERS) IMPLANT
CATH VISTA GUIDE 6FR XBLAD4 (CATHETERS) IMPLANT
GLIDESHEATH SLEND SS 6F .021 (SHEATH) IMPLANT
GUIDEWIRE INQWIRE 1.5J.035X260 (WIRE) IMPLANT
INQWIRE 1.5J .035X260CM (WIRE) ×1
KIT ENCORE 26 ADVANTAGE (KITS) IMPLANT
KIT HEART LEFT (KITS) ×1 IMPLANT
KIT MICROPUNCTURE NIT STIFF (SHEATH) IMPLANT
PACK CARDIAC CATHETERIZATION (CUSTOM PROCEDURE TRAY) ×1 IMPLANT
SHEATH PINNACLE 5F 10CM (SHEATH) IMPLANT
SHEATH PINNACLE 6F 10CM (SHEATH) IMPLANT
SHEATH PROBE COVER 6X72 (BAG) IMPLANT
STENT SYNERGY XD 3.0X16 (Permanent Stent) IMPLANT
SYNERGY XD 3.0X16 (Permanent Stent) ×1 IMPLANT
TRANSDUCER W/STOPCOCK (MISCELLANEOUS) ×1 IMPLANT
TUBING CIL FLEX 10 FLL-RA (TUBING) ×1 IMPLANT
WIRE EMERALD 3MM-J .035X150CM (WIRE) IMPLANT
WIRE HI TORQ BMW 190CM (WIRE) IMPLANT

## 2022-01-19 NOTE — Progress Notes (Signed)
Unable to complete echocardiogram due to other patient care. Echo will be re-attempted as the schedule permits.   Darlina Sicilian

## 2022-01-19 NOTE — Progress Notes (Signed)
TR BAND REMOVAL  LOCATION:    radial rt radial  DEFLATED PER PROTOCOL:  yes   TIME BAND OFF / DRESSING APPLIED:    1415/gauze and tegaderm  SITE UPON ARRIVAL:    Level 0  SITE AFTER BAND REMOVAL:    Level 0  CIRCULATION SENSATION AND MOVEMENT:    Within Normal Limits : yes, palpable rt radial, good pleth waveform   COMMENTS:

## 2022-01-19 NOTE — Progress Notes (Signed)
Location: right fem  Site upon arrival: level 0 Site upon removal: level 0  6Fr sheath pulled by 2H RN, [place], Manual pressure held for approximately 20 mins. Pt was stable and understood instructions given.   Gauze and Tegaderm placed on site  Right DP +2 palpable

## 2022-01-19 NOTE — Progress Notes (Signed)
ANTICOAGULATION CONSULT NOTE - Follow Up Consult  Pharmacy Consult for heparin Indication:  NSTEMI  Labs: Recent Labs    01/17/22 2038 01/17/22 2259 01/18/22 0529 01/18/22 0912 01/18/22 1632 01/19/22 0040  HGB 13.1  --  12.1*  --   --   --   HCT 37.9*  --  34.9*  --   --   --   PLT 299  --  262  --   --   --   HEPARINUNFRC  --   --   --  0.20* 0.13* 0.39  CREATININE 0.97  --  1.03  --   --   --   TROPONINIHS 721* 1,558* 2,042*  --   --   --     Assessment/Plan:  71yo male therapeutic on heparin after rate change. Will continue infusion at current rate of 1300 units/hr and confirm stable with additional level.    Wynona Neat, PharmD, BCPS  01/19/2022,2:48 AM

## 2022-01-19 NOTE — Progress Notes (Signed)
Rounding Note    Patient Name: Jamie Clark Date of Encounter: 01/19/2022  [facility] Cardiologist: None Fransico Him, MD  Subjective   Stable over night. Seen in cath lab.  Inpatient Medications    Scheduled Meds:  [MAR Hold] abacavir-dolutegravir-lamiVUDine  1 tablet Oral Daily   [MAR Hold] amLODipine  5 mg Oral Daily   [MAR Hold] aspirin EC  81 mg Oral Daily   [MAR Hold] atorvastatin  80 mg Oral Daily   [MAR Hold] carvedilol  6.25 mg Oral BID WC   [MAR Hold] hydrALAZINE  25 mg Oral Q8H   [MAR Hold] irbesartan  300 mg Oral Daily   [MAR Hold] potassium chloride  40 mEq Oral Q2H   Continuous Infusions:  sodium chloride 1 mL/kg/hr (01/19/22 0541)   heparin Stopped (01/19/22 0721)   [MAR Hold] nitroGLYCERIN 45 mcg/min (01/19/22 0215)   PRN Meds: [YBO Hold] acetaminophen, fentaNYL, Heparin (Porcine) in NaCl, heparin sodium (porcine), lidocaine (PF), midazolam, [MAR Hold] nitroGLYCERIN, [MAR Hold] ondansetron (ZOFRAN) IV, Radial Cocktail/Verapamil only   Vital Signs    Vitals:   01/19/22 0630 01/19/22 0700 01/19/22 0721 01/19/22 0738  BP: 123/67 116/72    Pulse: 85 86    Resp: 15 13    Temp:   98.2 F (36.8 C)   TempSrc:   Oral   SpO2: 93% 96%  95%  Weight:      Height:        Intake/Output Summary (Last 24 hours) at 01/19/2022 0810 Last data filed at 01/18/2022 1246 Gross per 24 hour  Intake 10.9 ml  Output --  Net 10.9 ml      01/17/2022    8:23 PM 11/21/2021    3:10 PM 10/23/2021   11:26 AM  Last 3 Weights  Weight (lbs) 180 lb 179 lb 182 lb  Weight (kg) 81.647 kg 81.194 kg 82.555 kg      Telemetry    NSR - Personally Reviewed  ECG    There is NSR with widespread ST inversion in V1-V5. - Personally Reviewed  Physical Exam  Appears older than stated age. GEN: No acute distress.   Neck: No JVD Cardiac: RRR, no murmurs, rubs, but with S4 gallop.  Respiratory: Clear to auscultation bilaterally. GI: Soft, nontender,  non-distended  MS: No edema; No deformity. Neuro:  Nonfocal  Psych: Normal affect   Labs    High Sensitivity Troponin:   Recent Labs  Lab 01/17/22 2038 01/17/22 2259 01/18/22 0529  TROPONINIHS 721* 1,558* 2,042*     Chemistry Recent Labs  Lab 01/17/22 2038 01/18/22 0529 01/19/22 0540  NA 138 140 138  K 3.2* 3.1* 2.9*  CL 106 108 113*  CO2 23 23 20*  GLUCOSE 115* 165* 137*  BUN 9 8 11   CREATININE 0.97 1.03 0.96  CALCIUM 8.9 8.6* 7.1*  MG  --  1.9  --   PROT  --  6.8  --   ALBUMIN  --  3.1*  --   AST  --  37  --   ALT  --  18  --   ALKPHOS  --  74  --   BILITOT  --  0.5  --   GFRNONAA >60 >60 >60  ANIONGAP 9 9 5     Lipids  Recent Labs  Lab 01/18/22 0529  CHOL 200  TRIG 108  HDL 28*  LDLCALC NOT CALCULATED  CHOLHDL 7.1    Hematology Recent Labs  Lab 01/17/22 2038 01/18/22 0529 01/19/22 0540  WBC 5.1 5.3 5.9  RBC 3.50* 3.20* 2.92*  HGB 13.1 12.1* 10.9*  HCT 37.9* 34.9* 31.7*  MCV 108.3* 109.1* 108.6*  MCH 37.4* 37.8* 37.3*  MCHC 34.6 34.7 34.4  RDW 13.6 13.8 14.0  PLT 299 262 266   Thyroid  Recent Labs  Lab 01/18/22 0529  TSH 0.833    BNPNo results for input(s): "BNP", "PROBNP" in the last 168 hours.  DDimer No results for input(s): "DDIMER" in the last 168 hours.   Radiology    DG Chest Port 1 View  Result Date: 01/17/2022 CLINICAL DATA:  Chest pain EXAM: PORTABLE CHEST 1 VIEW COMPARISON:  Chest x-ray 09/12/2017 FINDINGS: The heart size and mediastinal contours are within normal limits. There is linear atelectasis in the lung bases. Lungs are otherwise clear. No pleural effusion or pneumothorax. Visualized skeletal structures are unremarkable. IMPRESSION: Bibasilar atelectasis. Electronically Signed   By: Darliss Cheney M.D.   On: 01/17/2022 21:15    Cardiac Studies   No echo yet.  Patient Profile     71 y.o. male with a hx of HIV, chronic Hep C, depression, Anxiety and HTN who presented to the ER with complaints of chest pain and SOB  and found to have an elevated hs Troponin.  Assessment & Plan    NSTEMI: severe proximal LAD stented successfully. DAPT, statin, beta blocker, and ARB Hypertensive Urgency: Beta blocker, ARB, and diuretic therapy. Hyperlipidemia: High intensity statin. HIV: stable  For questions or updates, please contact [facility] Please consult [email or web address] for contact info under        Signed, Lesleigh Noe, MD  01/19/2022, 8:10 AM

## 2022-01-19 NOTE — Interval H&P Note (Signed)
History and Physical Interval Note:  01/19/2022 7:35 AM  Jamie Clark  has presented today for surgery, with the diagnosis of NSTEMI.  The various methods of treatment have been discussed with the patient and family. After consideration of risks, benefits and other options for treatment, the patient has consented to  Procedure(s): LEFT HEART CATH AND CORONARY ANGIOGRAPHY (N/A) as a surgical intervention.  The patient's history has been reviewed, patient examined, no change in status, stable for surgery.  I have reviewed the patient's chart and labs.  Questions were answered to the patient's satisfaction.    Cath Lab Visit (complete for each Cath Lab visit)  Clinical Evaluation Leading to the Procedure:   ACS: Yes.    Non-ACS:  N/A  Mary-Anne Polizzi

## 2022-01-20 ENCOUNTER — Inpatient Hospital Stay (HOSPITAL_COMMUNITY): Payer: Medicare Other

## 2022-01-20 ENCOUNTER — Encounter (HOSPITAL_COMMUNITY): Payer: Self-pay | Admitting: Internal Medicine

## 2022-01-20 ENCOUNTER — Other Ambulatory Visit (HOSPITAL_COMMUNITY): Payer: Self-pay

## 2022-01-20 DIAGNOSIS — I214 Non-ST elevation (NSTEMI) myocardial infarction: Secondary | ICD-10-CM

## 2022-01-20 DIAGNOSIS — I2510 Atherosclerotic heart disease of native coronary artery without angina pectoris: Secondary | ICD-10-CM | POA: Diagnosis not present

## 2022-01-20 LAB — BASIC METABOLIC PANEL
Anion gap: 10 (ref 5–15)
BUN: 11 mg/dL (ref 8–23)
CO2: 22 mmol/L (ref 22–32)
Calcium: 8.8 mg/dL — ABNORMAL LOW (ref 8.9–10.3)
Chloride: 110 mmol/L (ref 98–111)
Creatinine, Ser: 1.13 mg/dL (ref 0.61–1.24)
GFR, Estimated: >60 mL/min (ref >60)
Glucose, Bld: 96 mg/dL (ref 70–99)
Potassium: 3.4 mmol/L — ABNORMAL LOW (ref 3.5–5.1)
Sodium: 142 mmol/L (ref 135–145)

## 2022-01-20 LAB — CBC
HCT: 31.3 % — ABNORMAL LOW (ref 39.0–52.0)
Hemoglobin: 10.9 g/dL — ABNORMAL LOW (ref 13.0–17.0)
MCH: 37.5 pg — ABNORMAL HIGH (ref 26.0–34.0)
MCHC: 34.8 g/dL (ref 30.0–36.0)
MCV: 107.6 fL — ABNORMAL HIGH (ref 80.0–100.0)
Platelets: 273 10*3/uL (ref 150–400)
RBC: 2.91 MIL/uL — ABNORMAL LOW (ref 4.22–5.81)
RDW: 14.5 % (ref 11.5–15.5)
WBC: 5.2 10*3/uL (ref 4.0–10.5)
nRBC: 0 % (ref 0.0–0.2)

## 2022-01-20 LAB — ECHOCARDIOGRAM COMPLETE
Area-P 1/2: 2.41 cm2
Height: 67 in
S' Lateral: 1.9 cm
Weight: 2880 oz

## 2022-01-20 MED ORDER — IRBESARTAN 300 MG PO TABS
300.0000 mg | ORAL_TABLET | Freq: Every day | ORAL | 3 refills | Status: DC
  Filled 2022-01-20: qty 30, 30d supply, fill #0

## 2022-01-20 MED ORDER — NITROGLYCERIN 0.4 MG SL SUBL
0.4000 mg | SUBLINGUAL_TABLET | SUBLINGUAL | 1 refills | Status: DC | PRN
  Filled 2022-01-20: qty 25, 14d supply, fill #0

## 2022-01-20 MED ORDER — ATORVASTATIN CALCIUM 80 MG PO TABS
80.0000 mg | ORAL_TABLET | Freq: Every day | ORAL | 3 refills | Status: DC
  Filled 2022-01-20: qty 30, 30d supply, fill #0

## 2022-01-20 MED ORDER — POTASSIUM CHLORIDE CRYS ER 20 MEQ PO TBCR
40.0000 meq | EXTENDED_RELEASE_TABLET | Freq: Once | ORAL | Status: AC
  Administered 2022-01-20: 40 meq via ORAL
  Filled 2022-01-20: qty 2

## 2022-01-20 MED ORDER — AMLODIPINE BESYLATE 5 MG PO TABS
5.0000 mg | ORAL_TABLET | Freq: Every day | ORAL | 3 refills | Status: DC
  Filled 2022-01-20: qty 30, 30d supply, fill #0

## 2022-01-20 MED ORDER — PRASUGREL HCL 10 MG PO TABS
10.0000 mg | ORAL_TABLET | Freq: Every day | ORAL | 3 refills | Status: DC
  Filled 2022-01-20: qty 30, 30d supply, fill #0

## 2022-01-20 MED ORDER — ASPIRIN 81 MG PO TBEC
81.0000 mg | DELAYED_RELEASE_TABLET | Freq: Every day | ORAL | 12 refills | Status: AC
  Filled 2022-01-20: qty 30, 30d supply, fill #0

## 2022-01-20 MED ORDER — CARVEDILOL 6.25 MG PO TABS
6.2500 mg | ORAL_TABLET | Freq: Two times a day (BID) | ORAL | 3 refills | Status: AC
  Filled 2022-01-20: qty 60, 30d supply, fill #0

## 2022-01-20 NOTE — Progress Notes (Signed)
Rounding Note    Patient Name: Jamie Clark Date of Encounter: 01/20/2022  [place] Cardiologist: Fransico Him  Subjective   No chest pain or shortness of breath.  He is ambulated without difficulty.  Inpatient Medications    Scheduled Meds:  abacavir-dolutegravir-lamiVUDine  1 tablet Oral Daily   amLODipine  5 mg Oral Daily   aspirin EC  81 mg Oral Daily   atorvastatin  80 mg Oral Daily   carvedilol  6.25 mg Oral BID WC   enoxaparin (LOVENOX) injection  40 mg Subcutaneous Q24H   hydrALAZINE  25 mg Oral Q8H   irbesartan  300 mg Oral Daily   prasugrel  10 mg Oral Daily   sodium chloride flush  3 mL Intravenous Q12H   sodium chloride flush  3 mL Intravenous Q12H   traZODone  25 mg Oral QHS   Continuous Infusions:  sodium chloride     sodium chloride     nitroGLYCERIN Stopped (01/19/22 1900)   PRN Meds: sodium chloride, sodium chloride, acetaminophen, nitroGLYCERIN, sodium chloride flush, sodium chloride flush   Vital Signs    Vitals:   01/20/22 0021 01/20/22 0545 01/20/22 0713 01/20/22 0850  BP: (!) 144/87 (!) 148/88 (!) 160/83 (!) 142/72  Pulse: 96 87 92 (!) 103  Resp: 17 16 17 18   Temp: 99.1 F (37.3 C) 98.3 F (36.8 C) 98.9 F (37.2 C) 98.1 F (36.7 C)  TempSrc: Oral Oral Oral Oral  SpO2: 96% 98% 98%   Weight:      Height:        Intake/Output Summary (Last 24 hours) at 01/20/2022 1337 Last data filed at 01/20/2022 0800 Gross per 24 hour  Intake 363 ml  Output 650 ml  Net -287 ml      01/17/2022    8:23 PM 11/21/2021    3:10 PM 10/23/2021   11:26 AM  Last 3 Weights  Weight (lbs) 180 lb 179 lb 182 lb  Weight (kg) 81.647 kg 81.194 kg 82.555 kg      Telemetry    Normal sinus rhythm- Personally Reviewed  ECG    Persistent T wave abnormality but less bizarre this a.m.- Personally Reviewed  Physical Exam  Overweight GEN: No acute distress.   Neck: No JVD Cardiac: RRR, no murmurs, rubs, or gallops.  The femoral cath site  is unremarkable. Respiratory: Clear to auscultation bilaterally. GI: Soft, nontender, non-distended  MS: No edema; No deformity. Neuro:  Nonfocal  Psych: Normal affect   Labs    High Sensitivity Troponin:   Recent Labs  Lab 01/17/22 2038 01/17/22 2259 01/18/22 0529  TROPONINIHS 721* 1,558* 2,042*     Chemistry Recent Labs  Lab 01/18/22 0529 01/19/22 0540 01/20/22 0243  NA 140 138 142  K 3.1* 2.9* 3.4*  CL 108 113* 110  CO2 23 20* 22  GLUCOSE 165* 137* 96  BUN 8 11 11   CREATININE 1.03 0.96 1.13  CALCIUM 8.6* 7.1* 8.8*  MG 1.9  --   --   PROT 6.8  --   --   ALBUMIN 3.1*  --   --   AST 37  --   --   ALT 18  --   --   ALKPHOS 74  --   --   BILITOT 0.5  --   --   GFRNONAA >60 >60 >60  ANIONGAP 9 5 10     Lipids  Recent Labs  Lab 01/18/22 0529  CHOL 200  TRIG 108  HDL  28*  LDLCALC NOT CALCULATED  CHOLHDL 7.1    Hematology Recent Labs  Lab 01/18/22 0529 01/19/22 0540 01/20/22 0243  WBC 5.3 5.9 5.2  RBC 3.20* 2.92* 2.91*  HGB 12.1* 10.9* 10.9*  HCT 34.9* 31.7* 31.3*  MCV 109.1* 108.6* 107.6*  MCH 37.8* 37.3* 37.5*  MCHC 34.7 34.4 34.8  RDW 13.8 14.0 14.5  PLT 262 266 273   Thyroid  Recent Labs  Lab 01/18/22 0529  TSH 0.833    BNPNo results for input(s): "BNP", "PROBNP" in the last 168 hours.  DDimer No results for input(s): "DDIMER" in the last 168 hours.   Radiology    Normal chest x-ray with normal heart size  Cardiac Studies   Cardiac cath reviewed.  EF on cath was normal.  Patient Profile     71 y.o. male a hx of HIV, chronic Hep C, depression, Anxiety and HTN who presented to the ER with complaints of chest pain and SOB.  Cardiac cath revealed normal LV function and there was tight proximal LAD that was treated with stent.  Assessment & Plan    Acute coronary syndrome/non-ST elevation MI: Appropriate treatment with LAD stent.  Right femoral cath site is unremarkable. Primary hypertension: Controlled on current medical  regimen. Hyperlipidemia: Continue high intensity statin therapy  Ready for discharge later today after echocardiogram is completed.  For questions or updates, please contact [facility] Please consult [email or web address] for contact info under        Signed, Lesleigh Noe, MD  01/20/2022, 1:37 PM

## 2022-01-20 NOTE — Progress Notes (Signed)
CSW notified by RN that pt does not have transportation home. Pt is new CHF with SOB; has 4 block walk after bus stop close to home. Not safe for bus. CSW provided RN with taxi voucher to address listed in chart.

## 2022-01-20 NOTE — Discharge Summary (Addendum)
The patient has been seen in conjunction with Lily Kocher, PAC. All aspects of care have been considered and discussed. The patient has been personally interviewed, examined, and all clinical data has been reviewed.  Right femoral cath access site is unremarkable. No angina. LV function normal by cath and echo. Ambulated without difficulty.   Discharge Summary    Patient ID: Jamie Clark MRN: [id number]; DOB: 23-Mar-1951  Admit date: 01/17/2022 Discharge date: 01/20/2022  PCP:  Dorothyann Peng, NP   [facility] Providers Cardiologist:  None        Discharge Diagnoses    Principal Problem:   NSTEMI (non-ST elevated myocardial infarction) [facility])    Diagnostic Studies/Procedures    Conclusions: Severe single-vessel coronary artery disease with 90% proximal LAD stenosis.  There is mild, nonobstructive disease involving the distal LMCA and mid LAD.  LCx and RCA are tortuous with mild luminal irregularities. Normal left ventricular systolic function (LVEF 31-54%) with upper normal filling pressure (LVEDP 15 mmHg). Successful PCI to proximal LAD using Synergy 3.0 x 16 mm drug-eluting stent (postdilated to 3.3 mm) with 0% residual stenosis and TIMI-3 flow.   Recommendations: Remove right femoral artery sheath with manual compression when ACT has fallen below 175 seconds. Dual antiplatelet therapy with aspirin and prasugrel for at least 12 months. Aggressive secondary prevention. Follow-up echocardiogram.  Diagnostic Dominance: Right  Intervention    01/20/22 TTE  IMPRESSIONS     1. Left ventricular ejection fraction, by estimation, is 55 to 60%. The  left ventricle has normal function. The left ventricle has no regional  wall motion abnormalities. There is moderate asymmetric left ventricular  hypertrophy. Left ventricular  diastolic parameters are consistent with Grade I diastolic dysfunction  (impaired relaxation).   2. Right ventricular systolic  function is normal. The right ventricular  size is normal.   3. The mitral valve is normal in structure. No evidence of mitral valve  regurgitation. No evidence of mitral stenosis.   4. The aortic valve is tricuspid. There is mild calcification of the  aortic valve. There is mild thickening of the aortic valve. Aortic valve  regurgitation is not visualized. Aortic valve sclerosis/calcification is  present, without any evidence of  aortic stenosis.   5. The inferior vena cava is normal in size with greater than 50%  respiratory variability, suggesting right atrial pressure of 3 mmHg.   Comparison(s): No prior Echocardiogram.   FINDINGS   Left Ventricle: Left ventricular ejection fraction, by estimation, is 55  to 60%. The left ventricle has normal function. The left ventricle has no  regional wall motion abnormalities. The left ventricular internal cavity  size was normal in size. There is   moderate asymmetric left ventricular hypertrophy. Left ventricular  diastolic parameters are consistent with Grade I diastolic dysfunction  (impaired relaxation).   Right Ventricle: The right ventricular size is normal. No increase in  right ventricular wall thickness. Right ventricular systolic function is  normal.   Left Atrium: Left atrial size was normal in size.   Right Atrium: Right atrial size was normal in size.   Pericardium: There is no evidence of pericardial effusion. Presence of  epicardial fat layer.   Mitral Valve: The mitral valve is normal in structure. No evidence of  mitral valve regurgitation. No evidence of mitral valve stenosis.   Tricuspid Valve: The tricuspid valve is normal in structure. Tricuspid  valve regurgitation is not demonstrated. No evidence of tricuspid  stenosis.   Aortic Valve: The aortic valve  is tricuspid. There is mild calcification  of the aortic valve. There is mild thickening of the aortic valve. Aortic  valve regurgitation is not visualized.  Aortic valve  sclerosis/calcification is present, without any  evidence of aortic stenosis.   Pulmonic Valve: The pulmonic valve was not well visualized. Pulmonic valve  regurgitation is not visualized.   Aorta: The aortic root and ascending aorta are structurally normal, with  no evidence of dilitation.   Venous: The inferior vena cava is normal in size with greater than 50%  respiratory variability, suggesting right atrial pressure of 3 mmHg.   IAS/Shunts: No atrial level shunt detected by color flow Doppler.  _____________   History of Present Illness     Jamie Clark is a 71 y.o. male with a hx of HIV, chronic Hep C, depression, Anxiety and HTN who presented to the ER with complaints of chest pain and SOB and found to have an elevated hs Troponin.  Patient reported off and on chest pain since Thursday 10/26 with pain lasting about 15 minutes. He has also noticed worsening exertional tolerance with dyspnea over the past few weeks. On Saturday 10/28, patient had recurrence of this pain but says that it was more severe and longer lasting. He also experienced diaphoresis, nausea, vomiting, shortness of breath.   Hospital Course     Patient was placed on IV heparin and IV nitroglycerin with resolution of chest pain. He also started daily ASA 28m and had home Atorvastatin increased to 834m Troponin trended to a peak of 2042. Patient also noted to have hypertensive urgency and Coreg 6.25 BID was added to home regimen. Additionally, home Benicar was replaced with Avapro. On 10/30 patient underwent LHC with Dr. EnSaunders RevelPatient found with severe single vessel CAD with 90% proximal LAD stenosis. Mild nonobstructive disease involving the distal LMCA and mid LAD. Lcx and RCA tortuous with mild luminal irregularities. Patient had successful PCI to proximal LAD with DES. 0% residual stenosis, TIMI 3 flow.  On the day of discharge, patient remained chest pain free. BP found to be better controlled  and patient will go home on Amlodipine 64m44mD, Coreg 6.264m364mD, Hydralazine 264mg68m, Avapro 300mg 49mFor CAD, patient taking ASA/Prasugrel for at least the next 12 months as well as high intensity statin indefinitely.       Did the patient have an acute coronary syndrome (MI, NSTEMI, STEMI, etc) this admission?:  Yes                               AHA/ACC Clinical Performance & Quality Measures: Aspirin prescribed? - Yes ADP Receptor Inhibitor (Plavix/Clopidogrel, Brilinta/Ticagrelor or Effient/Prasugrel) prescribed (includes medically managed patients)? - Yes Beta Blocker prescribed? - Yes High Intensity Statin (Lipitor 40-80mg o77mestor 20-40mg) p564mribed? - Yes EF assessed during THIS hospitalization? - Yes For EF <40%, was ACEI/ARB prescribed? - Not Applicable (EF >/= 40%) For03% <40%, Aldosterone Antagonist (Spironolactone or Eplerenone) prescribed? - Not Applicable (EF >/= 40%) Car55%c Rehab Phase II ordered (including medically managed patients)? - Yes       The patient will be scheduled for a TOC follow up appointment with Scott WeRichardson Dopp @ 8:25am.  A message has been sent to the [facility] at the office where the patient should be seen for follow up.  _____________  Discharge Vitals Blood pressure (!) 148/80, pulse (!) 103, temperature 98.4 F (36.9 C), resp.  rate 20, height _0  (1.702 m), weight 81.6 kg, SpO2 98 %.  Filed Weights   01/17/22 2023  Weight: 81.6 kg   Physical Exam Vitals and nursing note reviewed.  Constitutional:      Appearance: He is well-developed.  HENT:     Head: Normocephalic.  Cardiovascular:     Rate and Rhythm: Normal rate and regular rhythm.  Pulmonary:     Effort: Pulmonary effort is normal. No tachypnea.     Breath sounds: Normal breath sounds.  Musculoskeletal:     Cervical back: Normal range of motion.  Skin:    General: Skin is warm and dry.     Capillary Refill: Capillary refill takes less than 2 seconds.      Comments: Vascular access site without bleeding, hematoma, swelling  Neurological:     General: No focal deficit present.     Mental Status: He is alert and oriented to person, place, and time.  Psychiatric:        Mood and Affect: Mood normal.     Labs & Radiologic Studies    CBC Recent Labs    01/19/22 0540 01/20/22 0243  WBC 5.9 5.2  HGB 10.9* 10.9*  HCT 31.7* 31.3*  MCV 108.6* 107.6*  PLT 266 754   Basic Metabolic Panel Recent Labs    01/18/22 0529 01/19/22 0540 01/20/22 0243  NA 140 138 142  K 3.1* 2.9* 3.4*  CL 108 113* 110  CO2 23 20* 22  GLUCOSE 165* 137* 96  BUN _1 CREATININE 1.03 0.96 1.13  CALCIUM 8.6* 7.1* 8.8*  MG 1.9  --   --    Liver Function Tests Recent Labs    01/18/22 0529  AST 37  ALT 18  ALKPHOS 74  BILITOT 0.5  PROT 6.8  ALBUMIN 3.1*   No results for input(s): "LIPASE", "AMYLASE" in the last 72 hours. High Sensitivity Troponin:   Recent Labs  Lab 01/17/22 2038 01/17/22 2259 01/18/22 0529  TROPONINIHS 721* 1,558* 2,042*    BNP Invalid input(s): "POCBNP" D-Dimer No results for input(s): "DDIMER" in the last 72 hours. Hemoglobin A1C Recent Labs    01/18/22 0529  HGBA1C 5.7*   Fasting Lipid Panel Recent Labs    01/18/22 0529  CHOL 200  HDL 28*  LDLCALC NOT CALCULATED  TRIG 108  CHOLHDL 7.1   Thyroid Function Tests Recent Labs    01/18/22 0529  TSH 0.833   _____________  ECHOCARDIOGRAM COMPLETE  Result Date: 01/20/2022    ECHOCARDIOGRAM REPORT   Patient Name:   Jamie Clark Date of Exam: 01/20/2022 Medical Rec #:  [id number]          Height:       67.0 in Accession #:    [id number]         Weight:       180.0 lb Date of Birth:  [birth year]          BSA:          1.934 m Patient Age:    71 years           BP:           143/72 mmHg Patient Gender: M                  HR:           85 bpm. Exam Location:  Inpatient Procedure: 2D Echo, Color Doppler and Cardiac Doppler Indications:    NSTEMI i21.4  History:        Patient has no prior history of Echocardiogram examinations.                 Risk Factors:Hypertension and Dyslipidemia.  Sonographer:    Raquel Sarna Senior RDCS Referring Phys: [place]  1. Left ventricular ejection fraction, by estimation, is 55 to 60%. The left ventricle has normal function. The left ventricle has no regional wall motion abnormalities. There is moderate asymmetric left ventricular hypertrophy. Left ventricular diastolic parameters are consistent with Grade I diastolic dysfunction (impaired relaxation).  2. Right ventricular systolic function is normal. The right ventricular size is normal.  3. The mitral valve is normal in structure. No evidence of mitral valve regurgitation. No evidence of mitral stenosis.  4. The aortic valve is tricuspid. There is mild calcification of the aortic valve. There is mild thickening of the aortic valve. Aortic valve regurgitation is not visualized. Aortic valve sclerosis/calcification is present, without any evidence of aortic stenosis.  5. The inferior vena cava is normal in size with greater than 50% respiratory variability, suggesting right atrial pressure of 3 mmHg. Comparison(s): No prior Echocardiogram. FINDINGS  Left Ventricle: Left ventricular ejection fraction, by estimation, is 55 to 60%. The left ventricle has normal function. The left ventricle has no regional wall motion abnormalities. The left ventricular internal cavity size was normal in size. There is  moderate asymmetric left ventricular hypertrophy. Left ventricular diastolic parameters are consistent with Grade I diastolic dysfunction (impaired relaxation). Right Ventricle: The right ventricular size is normal. No increase in right ventricular wall thickness. Right ventricular systolic function is normal. Left Atrium: Left atrial size was normal in size. Right Atrium: Right atrial size was normal in size. Pericardium: There is no evidence of pericardial effusion.  Presence of epicardial fat layer. Mitral Valve: The mitral valve is normal in structure. No evidence of mitral valve regurgitation. No evidence of mitral valve stenosis. Tricuspid Valve: The tricuspid valve is normal in structure. Tricuspid valve regurgitation is not demonstrated. No evidence of tricuspid stenosis. Aortic Valve: The aortic valve is tricuspid. There is mild calcification of the aortic valve. There is mild thickening of the aortic valve. Aortic valve regurgitation is not visualized. Aortic valve sclerosis/calcification is present, without any evidence of aortic stenosis. Pulmonic Valve: The pulmonic valve was not well visualized. Pulmonic valve regurgitation is not visualized. Aorta: The aortic root and ascending aorta are structurally normal, with no evidence of dilitation. Venous: The inferior vena cava is normal in size with greater than 50% respiratory variability, suggesting right atrial pressure of 3 mmHg. IAS/Shunts: No atrial level shunt detected by color flow Doppler.  LEFT VENTRICLE PLAX 2D LVIDd:         3.70 cm   Diastology LVIDs:         1.90 cm   LV e' medial:    5.87 cm/s LV PW:         1.20 cm   LV E/e' medial:  11.8 LV IVS:        1.30 cm   LV e' lateral:   8.81 cm/s LVOT diam:     2.30 cm   LV E/e' lateral: 7.8 LV SV:         102 LV SV Index:   53 LVOT Area:     4.15 cm  RIGHT VENTRICLE RV S prime:     19.50 cm/s TAPSE (M-mode): 2.4 cm LEFT ATRIUM  Index        RIGHT ATRIUM           Index LA diam:        3.30 cm 1.71 cm/m   RA Area:     13.90 cm LA Vol (A2C):   50.2 ml 25.96 ml/m  RA Volume:   33.30 ml  17.22 ml/m LA Vol (A4C):   31.4 ml 16.24 ml/m LA Biplane Vol: 43.0 ml 22.24 ml/m  AORTIC VALVE LVOT Vmax:   101.00 cm/s LVOT Vmean:  73.600 cm/s LVOT VTI:    0.245 m  AORTA Ao Root diam: 3.60 cm Ao Asc diam:  3.60 cm MITRAL VALVE MV Area (PHT): 2.41 cm    SHUNTS MV Decel Time: 315 msec    Systemic VTI:  0.24 m MV E velocity: 69.00 cm/s  Systemic Diam: 2.30 cm MV A  velocity: 95.70 cm/s MV E/A ratio:  0.72 Rudean Haskell MD Electronically signed by Rudean Haskell MD Signature Date/Time: 01/20/2022/2:54:17 PM    Final    CARDIAC CATHETERIZATION  Result Date: 01/19/2022 Conclusions: Severe single-vessel coronary artery disease with 90% proximal LAD stenosis.  There is mild, nonobstructive disease involving the distal LMCA and mid LAD.  LCx and RCA are tortuous with mild luminal irregularities. Normal left ventricular systolic function (LVEF 79-39%) with upper normal filling pressure (LVEDP 15 mmHg). Successful PCI to proximal LAD using Synergy 3.0 x 16 mm drug-eluting stent (postdilated to 3.3 mm) with 0% residual stenosis and TIMI-3 flow. Recommendations: Remove right femoral artery sheath with manual compression when ACT has fallen below 175 seconds. Dual antiplatelet therapy with aspirin and prasugrel for at least 12 months. Aggressive secondary prevention. Follow-up echocardiogram. Nelva Bush, MD [facility]  DG Chest Port 1 View  Result Date: 01/17/2022 CLINICAL DATA:  Chest pain EXAM: PORTABLE CHEST 1 VIEW COMPARISON:  Chest x-ray 09/12/2017 FINDINGS: The heart size and mediastinal contours are within normal limits. There is linear atelectasis in the lung bases. Lungs are otherwise clear. No pleural effusion or pneumothorax. Visualized skeletal structures are unremarkable. IMPRESSION: Bibasilar atelectasis. Electronically Signed   By: Ronney Asters M.D.   On: 01/17/2022 21:15   Disposition   Pt is being discharged home today in good condition.  Follow-up Plans & Appointments     Discharge Instructions     Amb Referral to Cardiac Rehabilitation   Complete by: As directed    Diagnosis:  Coronary Stents NSTEMI     After initial evaluation and assessments completed: Virtual Based Care may be provided alone or in conjunction with Phase 2 Cardiac Rehab based on patient barriers.: Yes   Intensive Cardiac Rehabilitation (ICR) [place]  location only OR Traditional Cardiac Rehabilitation (TCR) *If criteria for ICR are not met will enroll in TCR [facility] only): Yes   Diet - low sodium heart healthy   Complete by: As directed    Discharge instructions   Complete by: As directed    NO HEAVY LIFTING OR SEXUAL ACTIVITY X 7 DAYS. NO DRIVING X 3-5 DAYS. NO SOAKING BATHS, HOT TUBS, POOLS, ETC., X 7 DAYS.   Increase activity slowly   Complete by: As directed         Discharge Medications   Allergies as of 01/20/2022   No Known Allergies      Medication List     STOP taking these medications    ibuprofen 600 MG tablet Commonly known as: ADVIL   lidocaine 2 % solution Commonly known as: XYLOCAINE   Olmesartan-amLODIPine-HCTZ  40-10-25 MG Tabs       TAKE these medications    acetaminophen 500 MG tablet Commonly known as: TYLENOL Take 2 tablets (1,000 mg total) by mouth every 8 (eight) hours as needed.   amLODipine 5 MG tablet Commonly known as: NORVASC Take 1 tablet (5 mg total) by mouth daily. Start taking on: January 21, 2022   aspirin EC 81 MG tablet Take 1 tablet (81 mg total) by mouth daily. Swallow whole. Start taking on: January 21, 2022   atorvastatin 80 MG tablet Commonly known as: LIPITOR Take 1 tablet (80 mg total) by mouth daily. Start taking on: January 21, 2022 What changed:  medication strength how much to take   buPROPion 150 MG 24 hr tablet Commonly known as: WELLBUTRIN XL TAKE 1 TABLET(150 MG) BY MOUTH DAILY   carvedilol 6.25 MG tablet Commonly known as: COREG Take 1 tablet (6.25 mg total) by mouth 2 (two) times daily with a meal.   finasteride 5 MG tablet Commonly known as: Proscar Take 1 tablet (5 mg total) by mouth daily.   hydrALAZINE 25 MG tablet Commonly known as: APRESOLINE Take 1 tablet (25 mg total) by mouth 3 (three) times daily.   irbesartan 300 MG tablet Commonly known as: AVAPRO Take 1 tablet (300 mg total) by mouth daily. Start taking on: January 21, 2022   methocarbamol 500 MG tablet Commonly known as: ROBAXIN Take 1 tablet (500 mg total) by mouth 2 (two) times daily. What changed:  when to take this reasons to take this   nitroGLYCERIN 0.4 MG SL tablet Commonly known as: NITROSTAT Place 1 tablet (0.4 mg total) under the tongue every 5 (five) minutes x 3 doses as needed for chest pain.   prasugrel 10 MG Tabs tablet Commonly known as: EFFIENT Take 1 tablet (10 mg total) by mouth daily. Start taking on: January 21, 2022   tamsulosin 0.4 MG Caps capsule Commonly known as: FLOMAX Take 0.8 mg by mouth at bedtime.   traZODone 50 MG tablet Commonly known as: DESYREL Take 0.5 tablets (25 mg total) by mouth at bedtime.   Triumeq 600-50-300 MG tablet Generic drug: abacavir-dolutegravir-lamiVUDine Take 1 tablet by mouth daily.           Outstanding Labs/Studies     Duration of Discharge Encounter   Greater than 30 minutes including physician time.  Delton Coombes, PA-C 01/20/2022, 3:42 PM

## 2022-01-20 NOTE — Progress Notes (Signed)
  Echocardiogram 2D Echocardiogram has been performed.  Jamie Clark M 01/20/2022, 11:53 AM

## 2022-01-20 NOTE — Progress Notes (Signed)
Echocardiogram 2D Echocardiogram has been performed.  Oneal Deputy Suhail Peloquin RDCS 01/20/2022, 2:34 PM

## 2022-01-20 NOTE — Progress Notes (Signed)
CARDIAC REHAB PHASE I       Pt resting in bed feeling well today. Pt reports getting out of bed walking around with no cp or sob. Eager to go home today. Post MI/stent education including site care, restrictions, smoking cessation, site care, risk factors, heart healthy diet, MI booklet, antiplatelet therapy importance, exercise guidelines and CRP2 reviewed. All questions and concerns addressed. Will refer to [facility] for CRP2.   [id number]  Vanessa Barbara, RN BSN 01/20/2022 12:20 PM

## 2022-01-21 ENCOUNTER — Telehealth: Payer: Self-pay

## 2022-01-21 NOTE — Telephone Encounter (Signed)
Error

## 2022-01-21 NOTE — Telephone Encounter (Signed)
**Note De-identified Diora Bellizzi Obfuscation** -----  **Note De-Identified Mischele Detter Obfuscation** Message from Lily Kocher, PA-C sent at 01/20/2022  3:20 PM EDT ----- Regarding: TOC call Patient will need TOC call. Appointment with Richardson Dopp on 11/8 @ 8:25.  Lily Kocher

## 2022-01-21 NOTE — Telephone Encounter (Signed)
**Note De-Identified Jamie Clark Obfuscation** Transition Care Management Unsuccessful Follow-up Telephone Call  Date of discharge and from where:  01/20/2022 from [facility]  Attempts:  1st Attempt  Reason for unsuccessful TCM follow-up call:  Left voice message asking the pt to call Jeani Hawking back at [facility] at [phone].

## 2022-01-22 NOTE — Telephone Encounter (Signed)
**Note De-Identified Zebulun Deman Obfuscation** Transition Care Management Unsuccessful Follow-up Telephone Call  Date of discharge and from where:  01/20/2022 from [facility]  Attempts:  2nd Attempt  Reason for unsuccessful TCM follow-up call:  The pt answered his phone but stated that he is in a hurry and cannot s/w with me right now. He requested that I call him back tomorrow. I advised him that I will.

## 2022-01-23 ENCOUNTER — Ambulatory Visit: Payer: Medicare Other | Admitting: Podiatry

## 2022-01-23 NOTE — Telephone Encounter (Signed)
**Note De-Identified Jerrilyn Messinger Obfuscation** Transition Care Management Unsuccessful Follow-up Telephone Call  Date of discharge and from where:  01/20/2022 from [facility]  Attempts:  3rd Attempt  Reason for unsuccessful TCM follow-up call:  Left voice message asking the pt to Call [place]land back at [facility] at [phone]. I did lave a reminder in the VM (Okay per DPR) that he has a post hospital f/u scheduled with Richardson Dopp, PA-c on 01/28/2022 at 8:25 at [address], [place] in [place], [place] [address].

## 2022-01-28 ENCOUNTER — Ambulatory Visit: Payer: Medicare Other | Attending: Physician Assistant | Admitting: Nurse Practitioner

## 2022-01-28 ENCOUNTER — Encounter: Payer: Self-pay | Admitting: Nurse Practitioner

## 2022-01-28 VITALS — BP 140/70 | HR 78 | Ht 67.0 in | Wt 177.0 lb

## 2022-01-28 DIAGNOSIS — I25119 Atherosclerotic heart disease of native coronary artery with unspecified angina pectoris: Secondary | ICD-10-CM

## 2022-01-28 DIAGNOSIS — R7303 Prediabetes: Secondary | ICD-10-CM | POA: Diagnosis not present

## 2022-01-28 DIAGNOSIS — I97630 Postprocedural hematoma of a circulatory system organ or structure following a cardiac catheterization: Secondary | ICD-10-CM

## 2022-01-28 DIAGNOSIS — R55 Syncope and collapse: Secondary | ICD-10-CM | POA: Diagnosis not present

## 2022-01-28 DIAGNOSIS — I10 Essential (primary) hypertension: Secondary | ICD-10-CM

## 2022-01-28 DIAGNOSIS — I214 Non-ST elevation (NSTEMI) myocardial infarction: Secondary | ICD-10-CM

## 2022-01-28 DIAGNOSIS — R400 Somnolence: Secondary | ICD-10-CM | POA: Diagnosis not present

## 2022-01-28 DIAGNOSIS — R0789 Other chest pain: Secondary | ICD-10-CM

## 2022-01-28 DIAGNOSIS — T148XXA Other injury of unspecified body region, initial encounter: Secondary | ICD-10-CM

## 2022-01-28 DIAGNOSIS — E785 Hyperlipidemia, unspecified: Secondary | ICD-10-CM

## 2022-01-28 MED ORDER — AMLODIPINE BESYLATE 10 MG PO TABS: 10.0000 mg | ORAL_TABLET | Freq: Every day | ORAL | 3 refills | Status: DC

## 2022-01-28 NOTE — Patient Instructions (Addendum)
Medication Instructions:  Your physician has recommended you make the following change in your medication:   INCREASE Amlodipine to 10 mg taking 1 daily.  You can take 2 of the 5 mg tablets at 1 time to use them up.  A  new prescription has been sent to [facility]   *If you need a refill on your cardiac medications before your next appointment, please call your pharmacy*   Lab Work: TODAY:  BMET, CBC, ESR & CRP   If you have labs (blood work) drawn today and your tests are completely normal, you will receive your results only by: Angus (if you have MyChart) OR A paper copy in the mail If you have any lab test that is abnormal or we need to change your treatment, we will call you to review the results.   Testing/Procedures: Your physician has recommended that you have a sleep study. This test records several body functions during sleep, including: brain activity, eye movement, oxygen and carbon dioxide blood levels, heart rate and rhythm, breathing rate and rhythm, the flow of air through your mouth and nose, snoring, body muscle movements, and chest and belly movement. They will call you to arrange this.  Follow-Up: At [facility], you and your health needs are our priority.  As part of our continuing mission to provide you with exceptional heart care, we have created designated Provider Care Teams.  These Care Teams include your primary Cardiologist (physician) and Advanced Practice Providers (APPs -  Physician Assistants and Nurse Practitioners) who all work together to provide you with the care you need, when you need it.  We recommend signing up for the patient portal called "MyChart".  Sign up information is provided on this After Visit Summary.  MyChart is used to connect with patients for Virtual Visits (Telemedicine).  Patients are able to view lab/test results, encounter notes, upcoming appointments, etc.  Non-urgent messages can be sent to your provider as well.    To learn more about what you can do with MyChart, go to [email or web address].    Your next appointment:   2 week(s)  The format for your next appointment:   In Person  Provider:   Richardson Dopp, PA-C  Other Instructions He can have coffee, just limit it to maybe 1 cup.   DASH Eating Plan DASH stands for Dietary Approaches to Stop Hypertension. The DASH eating plan is a healthy eating plan that has been shown to: Reduce high blood pressure (hypertension). Reduce your risk for type 2 diabetes, heart disease, and stroke. Help with weight loss. What are tips for following this plan? Reading food labels Check food labels for the amount of salt (sodium) per serving. Choose foods with less than 5 percent of the Daily Value of sodium. Generally, foods with less than 300 milligrams (mg) of sodium per serving fit into this eating plan. To find whole grains, look for the word "whole" as the first word in the ingredient list. Shopping Buy products labeled as "low-sodium" or "no salt added." Buy fresh foods. Avoid canned foods and pre-made or frozen meals. Cooking Avoid adding salt when cooking. Use salt-free seasonings or herbs instead of table salt or sea salt. Check with your health care provider or pharmacist before using salt substitutes. Do not fry foods. Cook foods using healthy methods such as baking, boiling, grilling, roasting, and broiling instead. Cook with heart-healthy oils, such as olive, canola, avocado, soybean, or sunflower oil. Meal planning  Eat a balanced diet that  includes: 4 or more servings of fruits and 4 or more servings of vegetables each day. Try to fill one-half of your plate with fruits and vegetables. 6-8 servings of whole grains each day. Less than 6 oz (170 g) of lean meat, poultry, or fish each day. A 3-oz (85-g) serving of meat is about the same size as a deck of cards. One egg equals 1 oz (28 g). 2-3 servings of low-fat dairy each day. One serving is 1  cup (237 mL). 1 serving of nuts, seeds, or beans 5 times each week. 2-3 servings of heart-healthy fats. Healthy fats called omega-3 fatty acids are found in foods such as walnuts, flaxseeds, fortified milks, and eggs. These fats are also found in cold-water fish, such as sardines, salmon, and mackerel. Limit how much you eat of: Canned or prepackaged foods. Food that is high in trans fat, such as some fried foods. Food that is high in saturated fat, such as fatty meat. Desserts and other sweets, sugary drinks, and other foods with added sugar. Full-fat dairy products. Do not salt foods before eating. Do not eat more than 4 egg yolks a week. Try to eat at least 2 vegetarian meals a week. Eat more home-cooked food and less restaurant, buffet, and fast food. Lifestyle When eating at a restaurant, ask that your food be prepared with less salt or no salt, if possible. If you drink alcohol: Limit how much you use to: 0-1 drink a day for women who are not pregnant. 0-2 drinks a day for men. Be aware of how much alcohol is in your drink. In the [place] one drink equals one 12 oz bottle of beer (355 mL), one 5 oz glass of wine (148 mL), or one 1 oz glass of hard liquor (44 mL). General information Avoid eating more than 2,300 mg of salt a day. If you have hypertension, you may need to reduce your sodium intake to 1,500 mg a day. Work with your health care provider to maintain a healthy body weight or to lose weight. Ask what an ideal weight is for you. Get at least 30 minutes of exercise that causes your heart to beat faster (aerobic exercise) most days of the week. Activities may include walking, swimming, or biking. Work with your health care provider or dietitian to adjust your eating plan to your individual calorie needs. What foods should I eat? Fruits All fresh, dried, or frozen fruit. Canned fruit in natural juice (without added sugar). Vegetables Fresh or frozen vegetables (raw, steamed,  roasted, or grilled). Low-sodium or reduced-sodium tomato and vegetable juice. Low-sodium or reduced-sodium tomato sauce and tomato paste. Low-sodium or reduced-sodium canned vegetables. Grains Whole-grain or whole-wheat bread. Whole-grain or whole-wheat pasta. Brown rice. [place]. Bulgur. Whole-grain and low-sodium cereals. Pita bread. Low-fat, low-sodium crackers. Whole-wheat flour tortillas. Meats and other proteins Skinless chicken or [place]. Ground chicken or [place]. Pork with fat trimmed off. Fish and seafood. Egg whites. Dried beans, peas, or lentils. Unsalted nuts, nut butters, and seeds. Unsalted canned beans. Lean cuts of beef with fat trimmed off. Low-sodium, lean precooked or cured meat, such as sausages or meat loaves. Dairy Low-fat (1%) or fat-free (skim) milk. Reduced-fat, low-fat, or fat-free cheeses. Nonfat, low-sodium ricotta or cottage cheese. Low-fat or nonfat yogurt. Low-fat, low-sodium cheese. Fats and oils Soft margarine without trans fats. Vegetable oil. Reduced-fat, low-fat, or light mayonnaise and salad dressings (reduced-sodium). Canola, safflower, olive, avocado, soybean, and sunflower oils. Avocado. Seasonings and condiments Herbs. Spices. Seasoning mixes without  salt. Other foods Unsalted popcorn and pretzels. Fat-free sweets. The items listed above may not be a complete list of foods and beverages you can eat. Contact a dietitian for more information. What foods should I avoid? Fruits Canned fruit in a light or heavy syrup. Fried fruit. Fruit in cream or butter sauce. Vegetables Creamed or fried vegetables. Vegetables in a cheese sauce. Regular canned vegetables (not low-sodium or reduced-sodium). Regular canned tomato sauce and paste (not low-sodium or reduced-sodium). Regular tomato and vegetable juice (not low-sodium or reduced-sodium). Angie Fava. Olives. Grains Baked goods made with fat, such as croissants, muffins, or some breads. Dry pasta or rice meal  packs. Meats and other proteins Fatty cuts of meat. Ribs. Fried meat. Berniece Salines. Bologna, salami, and other precooked or cured meats, such as sausages or meat loaves. Fat from the back of a pig (fatback). Bratwurst. Salted nuts and seeds. Canned beans with added salt. Canned or smoked fish. Whole eggs or egg yolks. Chicken or [place] with skin. Dairy Whole or 2% milk, cream, and half-and-half. Whole or full-fat cream cheese. Whole-fat or sweetened yogurt. Full-fat cheese. Nondairy creamers. Whipped toppings. Processed cheese and cheese spreads. Fats and oils Butter. Stick margarine. Lard. Shortening. Ghee. Bacon fat. Tropical oils, such as coconut, palm kernel, or palm oil. Seasonings and condiments Onion salt, garlic salt, seasoned salt, table salt, and sea salt. Worcestershire sauce. Tartar sauce. Barbecue sauce. Teriyaki sauce. Soy sauce, including reduced-sodium. Steak sauce. Canned and packaged gravies. Fish sauce. Oyster sauce. Cocktail sauce. Store-bought horseradish. Ketchup. Mustard. Meat flavorings and tenderizers. Bouillon cubes. Hot sauces. Pre-made or packaged marinades. Pre-made or packaged taco seasonings. Relishes. Regular salad dressings. Other foods Salted popcorn and pretzels. The items listed above may not be a complete list of foods and beverages you should avoid. Contact a dietitian for more information. Where to find more information National Heart, Lung, and Blood Institute: https://wilson-eaton.com/ American Heart Association: www.heart.org Academy of Nutrition and Dietetics: www.eatright.[place]: www.kidney.org Summary The DASH eating plan is a healthy eating plan that has been shown to reduce high blood pressure (hypertension). It may also reduce your risk for type 2 diabetes, heart disease, and stroke. When on the DASH eating plan, aim to eat more fresh fruits and vegetables, whole grains, lean proteins, low-fat dairy, and heart-healthy fats. With the DASH  eating plan, you should limit salt (sodium) intake to 2,300 mg a day. If you have hypertension, you may need to reduce your sodium intake to 1,500 mg a day. Work with your health care provider or dietitian to adjust your eating plan to your individual calorie needs. This information is not intended to replace advice given to you by your health care provider. Make sure you discuss any questions you have with your health care provider. Document Revised: 02/10/2019 Document Reviewed: 02/10/2019 Elsevier Patient Education  [place]

## 2022-01-28 NOTE — Progress Notes (Signed)
Cardiology Office Note:    Date:  01/28/2022   ID:  Jamie Clark, DOB 1951/02/12, MRN [id number]  PCP:  Dorothyann Peng, NP   [place] [facility] Cardiologist:  Fransico Him, MD     Referring MD: Dorothyann Peng, NP   CC: NSTEMI follow-up  History of Present Illness:    Jamie Clark is a 71 y.o. male with a hx of the following:  CAD, s/p NSTEMI and DES to pLAD (12/2021) HIV Chronic Hep C Depression  Anxiety HTN Tobacco abuse   No previous cardiovascular history.  Presented to the ED on January 17, 2022 with chief complaint of chest pain and shortness of breath and found to have elevated troponin, 721 >> 1558. his chest pain was associated with nausea, diaphoresis, vomiting as well as shortness of breath.  Chest pain was described as sharp, midsternal without radiation, had been having DOE for weeks.  Smoked about 4 cigarettes daily, denied any illicit drug use.  Chest x-ray revealed bibasilar atelectasis, otherwise nothing acute.  EKG revealed lateral ischemia, also had hypertensive urgency so it was felt elevated troponins could be due to ACS or demand ischemia, he had recently ran out of his medications.  Underwent left heart cath that revealed severe single-vessel CAD with 90% proximal LAD stenosis.  There was mild, nonobstructive disease involving the distal LMCA and mid LAD.  Left circumflex and RCA were tortuous with mild luminal irregularities, normal LV SF with EF 55 to 65%, with upper normal filling pressure, LVEDP 15 mmHg.  Successful PCI to proximal LAD with drug-eluting stent with 0% residual stenosis and TIMI-3 flow.  DAPT with aspirin and Effient for at least 12 months and aggressive secondary prevention recommended.  Referred to cardiac rehab.  Echo postprocedure revealed EF 55 to 60%, moderate asymmetric LVH, grade 1 DD, no RWMA, mild calcification of aortic valve without stenosis, otherwise normal findings.  Today he presents for transition of care  follow-up appointment post NSTEMI.  He states he is still having similar chest pain, not as often. Described CP as dull, sternal CP, 4/10 in intensity, not always associated with exertion, does admit to [facility] with exertion. Has noticed fatigue since leaving the hospital (STOP-Bang Score is 5), notes poor appetite, and having nausea in the AM. Had an episode where he took one tablet of SL NG and passed out, believes his blood surgar was low, experienced weakness and blurry vision prior to syncopal episode. Says he was down for about 10 minutes. Hasn't had any recurrent syncopal episodes since and stated he has not taken any additional SL NG. Has quit smoking cigarettes. Denies any palpitations, dizziness, melena, hematuria, hemoptysis, dark, tarry stools, swelling, or claudication. Notes bruising along right groin site.   Past Medical History:  Diagnosis Date   Allergy    Anxiety    Depression    Hepatitis C, chronic ([place])    HIV positive ([place])    Hypertension     Past Surgical History:  Procedure Laterality Date   CORONARY STENT INTERVENTION N/A 01/19/2022   Procedure: CORONARY STENT INTERVENTION;  Surgeon: Nelva Bush, MD;  Location: [place] [facility];  Service: Cardiovascular;  Laterality: N/A;   LEFT HEART CATH AND CORONARY ANGIOGRAPHY N/A 01/19/2022   Procedure: LEFT HEART CATH AND CORONARY ANGIOGRAPHY;  Surgeon: Nelva Bush, MD;  Location: [place] [facility];  Service: Cardiovascular;  Laterality: N/A;   NO PAST SURGERIES      Current Medications: Current Meds  Medication Sig   abacavir-dolutegravir-lamiVUDine (  TRIUMEQ) [id number] MG tablet Take 1 tablet by mouth daily.   acetaminophen (TYLENOL) 500 MG tablet Take 2 tablets (1,000 mg total) by mouth every 8 (eight) hours as needed.   amLODipine (NORVASC) 5 MG tablet Take 1 tablet (5 mg total) by mouth daily.   aspirin EC 81 MG tablet Take 1 tablet (81 mg total) by mouth daily. Swallow whole.   atorvastatin (LIPITOR) 80 MG  tablet Take 1 tablet (80 mg total) by mouth daily.   buPROPion (WELLBUTRIN XL) 150 MG 24 hr tablet TAKE 1 TABLET(150 MG) BY MOUTH DAILY   carvedilol (COREG) 6.25 MG tablet Take 1 tablet (6.25 mg total) by mouth 2 (two) times daily with a meal.   finasteride (PROSCAR) 5 MG tablet Take 1 tablet (5 mg total) by mouth daily.   hydrALAZINE (APRESOLINE) 25 MG tablet Take 1 tablet (25 mg total) by mouth 3 (three) times daily.   irbesartan (AVAPRO) 300 MG tablet Take 1 tablet (300 mg total) by mouth daily.   methocarbamol (ROBAXIN) 500 MG tablet Take 1 tablet (500 mg total) by mouth 2 (two) times daily.   nitroGLYCERIN (NITROSTAT) 0.4 MG SL tablet Place 1 tablet (0.4 mg total) under the tongue every 5 (five) minutes x 3 doses as needed for chest pain.   prasugrel (EFFIENT) 10 MG TABS tablet Take 1 tablet (10 mg total) by mouth daily.   tamsulosin (FLOMAX) 0.4 MG CAPS capsule Take 0.8 mg by mouth at bedtime.   traZODone (DESYREL) 50 MG tablet Take 0.5 tablets (25 mg total) by mouth at bedtime.     Allergies:   Patient has no known allergies.   Social History   Socioeconomic History   Marital status: Single    Spouse name: Not on file   Number of children: Not on file   Years of education: Not on file   Highest education level: 12th grade  Occupational History   Not on file  Tobacco Use   Smoking status: Light Smoker    Packs/day: 0.15    Types: Cigarettes   Smokeless tobacco: Never   Tobacco comments:    3 per day, working on quitting  Vaping Use   Vaping Use: Never used  Substance and Sexual Activity   Alcohol use: No    Alcohol/week: 0.0 standard drinks of alcohol   Drug use: No   Sexual activity: Not on file    Comment: given condoms 05/2020  Other Topics Concern   Not on file  Social History Narrative   Not on file   Social Determinants of Health   Financial Resource Strain: Medium Risk (11/11/2021)   Overall Financial Resource Strain (CARDIA)    Difficulty of Paying Living  Expenses: Somewhat hard  Food Insecurity: No Food Insecurity (01/09/2022)   Hunger Vital Sign    Worried About Running Out of Food in the Last Year: Never true    [place] in the Last Year: Never true  Recent Concern: Food Insecurity - Food Insecurity Present (11/11/2021)   Hunger Vital Sign    Worried About Running Out of Food in the Last Year: Sometimes true    Ran Out of Food in the Last Year: Often true  Transportation Needs: No Transportation Needs (01/09/2022)   PRAPARE - Hydrologist (Medical): No    Lack of Transportation (Non-Medical): No  Physical Activity: Insufficiently Active (11/11/2021)   Exercise Vital Sign    Days of Exercise per Week: 3 days  Minutes of Exercise per Session: 30 min  Stress: No Stress Concern Present (11/11/2021)   [place]    Feeling of Stress : Only a little  Social Connections: Moderately Integrated (11/11/2021)   Social Connection and Isolation Panel [NHANES]    Frequency of Communication with Friends and Family: Twice a week    Frequency of Social Gatherings with Friends and Family: Twice a week    Attends Religious Services: More than 4 times per year    Active Member of [employer] or Organizations: Yes    Attends Archivist Meetings: 1 to 4 times per year    Marital Status: Divorced     Family History: The patient's family history includes Cancer in his father and mother; Esophageal cancer in his mother; Hypertension in his father and mother. There is no history of Colon cancer, Colon polyps, Rectal cancer, or Stomach cancer. Sibling who died from MI.   ROS:   Review of Systems  Constitutional:  Positive for malaise/fatigue. Negative for chills, diaphoresis, fever and weight loss.  HENT: Negative.    Eyes: Negative.   Respiratory:  Positive for shortness of breath. Negative for cough, hemoptysis, sputum production and wheezing.         See HPI.   Cardiovascular:  Positive for chest pain. Negative for palpitations, orthopnea, claudication, leg swelling and PND.       See HPI.   Gastrointestinal:  Positive for nausea. Negative for abdominal pain, blood in stool, constipation, diarrhea, heartburn, melena and vomiting.       Nausea in AM.   Genitourinary:  Positive for frequency and urgency. Negative for dysuria, flank pain and hematuria.  Musculoskeletal:  Positive for falls. Negative for back pain, joint pain, myalgias and neck pain.       Recent syncope and collapse  Skin: Negative.   Neurological:  Positive for loss of consciousness. Negative for dizziness, tingling, tremors, sensory change, speech change, focal weakness, seizures, weakness and headaches.       See HPI.   Endo/Heme/Allergies:  Negative for environmental allergies and polydipsia. Bruises/bleeds easily.       Bruising to right femoral site post cath  Psychiatric/Behavioral:  Negative for depression, hallucinations, memory loss, substance abuse and suicidal ideas. The patient is nervous/anxious. The patient does not have insomnia.     Please see the history of present illness.    All other systems reviewed and are negative.  EKGs/Labs/Other Studies Reviewed:    The following studies were reviewed today:   EKG:  EKG is ordered today.  The ekg ordered today demonstrates SR, 78 bpm, possible left atrial enlargement, T wave inversion in leads V4, V5, and V6, consider anterolateral ischemia.   2D Echo complete on 01/20/2022: 1. Left ventricular ejection fraction, by estimation, is 55 to 60%. The  left ventricle has normal function. The left ventricle has no regional  wall motion abnormalities. There is moderate asymmetric left ventricular  hypertrophy. Left ventricular  diastolic parameters are consistent with Grade I diastolic dysfunction  (impaired relaxation).   2. Right ventricular systolic function is normal. The right ventricular  size is normal.    3. The mitral valve is normal in structure. No evidence of mitral valve  regurgitation. No evidence of mitral stenosis.   4. The aortic valve is tricuspid. There is mild calcification of the  aortic valve. There is mild thickening of the aortic valve. Aortic valve  regurgitation is not visualized. Aortic valve  sclerosis/calcification is  present, without any evidence of  aortic stenosis.   5. The inferior vena cava is normal in size with greater than 50%  respiratory variability, suggesting right atrial pressure of 3 mmHg.   Comparison(s): No prior Echocardiogram.   Left Heart Cath on 01/19/22: Severe single-vessel coronary artery disease with 90% proximal LAD stenosis.  There is mild, nonobstructive disease involving the distal LMCA and mid LAD.  LCx and RCA are tortuous with mild luminal irregularities. Normal left ventricular systolic function (LVEF 01-60%) with upper normal filling pressure (LVEDP 15 mmHg). Successful PCI to proximal LAD using Synergy 3.0 x 16 mm drug-eluting stent (postdilated to 3.3 mm) with 0% residual stenosis and TIMI-3 flow.   Recommendations: Remove right femoral artery sheath with manual compression when ACT has fallen below 175 seconds. Dual antiplatelet therapy with aspirin and prasugrel for at least 12 months. Aggressive secondary prevention. Follow-up echocardiogram.   Recent Labs: 01/18/2022: ALT 18; Magnesium 1.9; TSH 0.833 01/20/2022: BUN 11; Creatinine, Ser 1.13; Hemoglobin 10.9; Platelets 273; Potassium 3.4; Sodium 142  Recent Lipid Panel    Component Value Date/Time   CHOL 200 01/18/2022 0529   CHOL 170 03/30/2013 0000   TRIG 108 01/18/2022 0529   TRIG 148 03/30/2013 0000   HDL 28 (L) 01/18/2022 0529   CHOLHDL 7.1 01/18/2022 0529   VLDL 22 01/18/2022 0529   LDLCALC NOT CALCULATED 01/18/2022 0529   LDLCALC 133 (H) 12/19/2020 1030   LDLCALC 93 10/12/2012 0000     Risk Assessment/Calculations:      HYPERTENSION CONTROL Vitals:    01/28/22 0810 01/28/22 0845  BP: (!) 140/80 (!) 140/70    The patient's blood pressure is elevated above target today.  In order to address the patient's elevated BP: Blood pressure will be monitored at home to determine if medication changes need to be made.; A current anti-hypertensive medication was adjusted today.; Follow up with general cardiology has been recommended.      STOP-Bang Score:  5       Physical Exam:    VS:  BP (!) 140/70 (BP Location: Right Arm, Patient Position: Sitting, Cuff Size: Normal)   Pulse 78   Ht _0  (1.702 m)   Wt 177 lb (80.3 kg)   SpO2 97%   BMI 27.72 kg/m     Wt Readings from Last 3 Encounters:  01/28/22 177 lb (80.3 kg)  01/17/22 180 lb (81.6 kg)  11/21/21 179 lb (81.2 kg)     GEN: Well nourished, well developed in no acute distress HEENT: Normal NECK: No JVD; No carotid bruits CARDIAC: S1/S2, RRR, no murmurs, rubs, gallops; 2+ peripheral pulses throughout, strong and equal bilaterally RESPIRATORY:  Clear and diminished to auscultation without rales, wheezing or rhonchi  MUSCULOSKELETAL:  No edema; No deformity  SKIN: Bruising noted along right femoral access site with small hematoma noted, thin skin, overall warm and dry NEUROLOGIC:  Alert and oriented x 3 PSYCHIATRIC:  Calm, pleasant affect, tearful at one point during exam  ASSESSMENT:    1. NSTEMI (non-ST elevated myocardial infarction) ([place])   2. Atypical chest pain   3. Coronary artery disease involving native heart with angina pectoris, unspecified vessel or lesion type ([place])   4. Syncope and collapse   5. Hematoma   6. Essential hypertension   7. Hyperlipidemia LDL goal <70   8. Prediabetes   9. Daytime sleepiness    PLAN:    In order of problems listed above:  CAD, s/p NSTEMI and  DES to pLAD, atypical chest pain Atypical chest pain that has improved some since leaving the hospital. EKG reveals T wave inversions in leads V4, V5, and V6. Pt noted CP during exam at rest  on exam table, lasted less than a minute and noted while taking a deep breath in. Will obtain ESR and CRP, though highly doubt this is pericarditis as he denied any positional CP. Etiology unclear, though could also be vasospastic. Will increase Amlodipine to 10 mg daily. Continue ASA, Lipitor, Coreg, Irbesartan, Effient, and NG PRN. Heart healthy diet and regular cardiovascular exercise as tolerated encouraged. At this point, he is not ready for cardiac rehab as he is still having CP. Plan to closely have him follow-up with APP in office.   Cardiac Rehabilitation Eligibility Assessment  The patient is NOT ready to start cardiac rehabilitation due to: -- (Pt having chest pain) Plan to further assess and evaluate at next OV.    Syncope and collapse Etiology unclear, but either due to hypoglycemia or vasovagal. Encouraged regular meals and Ensure or Boost supplements. Heart healthy diet and regular cardiovascular exercise encouraged. Will obtain CBC and BMET today. If recurrence by next OV, consider 14 day live Zio.   3. Hematoma Very small in size, about 1 inch in length and 0.5 inch in width. Will obtain CBC today. Bruising noted around site and on anterior upper right thigh. Will obtain CBC. Recommended a heat pack for continued healing.   4. HTN BP initially 140/80 and repeat BP 140/70. Will increase Amlodipine as mentioned above. Continue Hydralazine and Irbesartan. Discussed to monitor BP at home at least 2 hours after medications and sitting for 5-10 minutes. Heart healthy diet and regular cardiovascular exercise encouraged. Will obtain BMET today.   5. HLD Labs on file reveal total cholesterol 200, HDL 28, LDL 146, and TG 108. Started on high dose Atorvastatin in hospital. Plan to arrange FLP and LFT in 2 months at next OV. Heart healthy diet and regular cardiovascular exercise encouraged.   6. Prediabetes A1C 5.7%. Currently not on any medication. Being managed by PCP. Heart healthy diet  and regular cardiovascular exercise encouraged.    7. Daytime sleepiness STOP-Bang score today is 5. Will arrange split night sleep study.   8. Disposition: Follow-up with APP in 2-3 weeks or sooner if anything changes.   Medication Adjustments/Labs and Tests Ordered: Current medicines are reviewed at length with the patient today.  Concerns regarding medicines are outlined above.  Orders Placed This Encounter  Procedures   Sedimentation rate   CRP High sensitivity   Basic metabolic panel   CBC   EKG 12-Lead   Split night study   Meds ordered this encounter  Medications   amLODipine (NORVASC) 10 MG tablet    Sig: Take 1 tablet (10 mg total) by mouth daily.    Dispense:  90 tablet    Refill:  3    Patient Instructions  Medication Instructions:  Your physician has recommended you make the following change in your medication:   INCREASE Amlodipine to 10 mg taking 1 daily.  You can take 2 of the 5 mg tablets at 1 time to use them up.  A  [facility] prescription has been [facility] to [facility]   *If you need a refill on your cardiac medications before your next appointment, please call your pharmacy*   Lab Work: TODAY:  BMET, CBC, ESR & CRP   If you have labs (blood work) drawn today and your tests are  completely normal, you will receive your results only by: MyChart Message (if you have MyChart) OR A paper copy in the mail If you have any lab test that is abnormal or we need to change your treatment, we will call you to review the results.   Testing/Procedures: Your physician has recommended that you have a sleep study. This test records several body functions during sleep, including: brain activity, eye movement, oxygen and carbon dioxide blood levels, heart rate and rhythm, breathing rate and rhythm, the flow of air through your mouth and nose, snoring, body muscle movements, and chest and belly movement. They will call you to arrange this.  Follow-Up: At [facility], you  and your health needs are our priority.  As part of our continuing mission to provide you with exceptional heart care, we have created designated Provider Care Teams.  These Care Teams include your primary Cardiologist (physician) and Advanced Practice Providers (APPs -  Physician Assistants and Nurse Practitioners) who all work together to provide you with the care you need, when you need it.  We recommend signing up for the patient portal called "MyChart".  Sign up information is provided on this After Visit Summary.  MyChart is used to connect with patients for Virtual Visits (Telemedicine).  Patients are able to view lab/test results, encounter notes, upcoming appointments, etc.  Non-urgent messages can be sent to your provider as well.   To learn more about what you can do with MyChart, go to [email or web address].    Your next appointment:   2 week(s)  The format for your next appointment:   In Person  Provider:   Richardson Dopp, PA-C  Other Instructions He can have coffee, just limit it to maybe 1 cup.   DASH Eating Plan DASH stands for Dietary Approaches to Stop Hypertension. The DASH eating plan is a healthy eating plan that has been shown to: Reduce high blood pressure (hypertension). Reduce your risk for type 2 diabetes, heart disease, and stroke. Help with weight loss. What are tips for following this plan? Reading food labels Check food labels for the amount of salt (sodium) per serving. Choose foods with less than 5 percent of the Daily Value of sodium. Generally, foods with less than 300 milligrams (mg) of sodium per serving fit into this eating plan. To find whole grains, look for the word "whole" as the first word in the ingredient list. Shopping Buy products labeled as "low-sodium" or "no salt added." Buy fresh foods. Avoid canned foods and pre-made or frozen meals. Cooking Avoid adding salt when cooking. Use salt-free seasonings or herbs instead of table salt or sea  salt. Check with your health care provider or pharmacist before using salt substitutes. Do not fry foods. Cook foods using healthy methods such as baking, boiling, grilling, roasting, and broiling instead. Cook with heart-healthy oils, such as olive, canola, avocado, soybean, or sunflower oil. Meal planning  Eat a balanced diet that includes: 4 or more servings of fruits and 4 or more servings of vegetables each day. Try to fill one-half of your plate with fruits and vegetables. 6-8 servings of whole grains each day. Less than 6 oz (170 g) of lean meat, poultry, or fish each day. A 3-oz (85-g) serving of meat is about the same size as a deck of cards. One egg equals 1 oz (28 g). 2-3 servings of low-fat dairy each day. One serving is 1 cup (237 mL). 1 serving of nuts, seeds, or beans 5 times each  week. 2-3 servings of heart-healthy fats. Healthy fats called omega-3 fatty acids are found in foods such as walnuts, flaxseeds, fortified milks, and eggs. These fats are also found in cold-water fish, such as sardines, salmon, and mackerel. Limit how much you eat of: Canned or prepackaged foods. Food that is high in trans fat, such as some fried foods. Food that is high in saturated fat, such as fatty meat. Desserts and other sweets, sugary drinks, and other foods with added sugar. Full-fat dairy products. Do not salt foods before eating. Do not eat more than 4 egg yolks a week. Try to eat at least 2 vegetarian meals a week. Eat more home-cooked food and less restaurant, buffet, and fast food. Lifestyle When eating at a restaurant, ask that your food be prepared with less salt or no salt, if possible. If you drink alcohol: Limit how much you use to: 0-1 drink a day for women who are not pregnant. 0-2 drinks a day for men. Be aware of how much alcohol is in your drink. In the [place] one drink equals one 12 oz bottle of beer (355 mL), one 5 oz glass of wine (148 mL), or one 1 oz glass of hard  liquor (44 mL). General information Avoid eating more than 2,300 mg of salt a day. If you have hypertension, you may need to reduce your sodium intake to 1,500 mg a day. Work with your health care provider to maintain a healthy body weight or to lose weight. Ask what an ideal weight is for you. Get at least 30 minutes of exercise that causes your heart to beat faster (aerobic exercise) most days of the week. Activities may include walking, swimming, or biking. Work with your health care provider or dietitian to adjust your eating plan to your individual calorie needs. What foods should I eat? Fruits All fresh, dried, or frozen fruit. Canned fruit in natural juice (without added sugar). Vegetables Fresh or frozen vegetables (raw, steamed, roasted, or grilled). Low-sodium or reduced-sodium tomato and vegetable juice. Low-sodium or reduced-sodium tomato sauce and tomato paste. Low-sodium or reduced-sodium canned vegetables. Grains Whole-grain or whole-wheat bread. Whole-grain or whole-wheat pasta. Brown rice. [place]. Bulgur. Whole-grain and low-sodium cereals. Pita bread. Low-fat, low-sodium crackers. Whole-wheat flour tortillas. Meats and other proteins Skinless chicken or [place]. Ground chicken or [place]. Pork with fat trimmed off. Fish and seafood. Egg whites. Dried beans, peas, or lentils. Unsalted nuts, nut butters, and seeds. Unsalted canned beans. Lean cuts of beef with fat trimmed off. Low-sodium, lean precooked or cured meat, such as sausages or meat loaves. Dairy Low-fat (1%) or fat-free (skim) milk. Reduced-fat, low-fat, or fat-free cheeses. Nonfat, low-sodium ricotta or cottage cheese. Low-fat or nonfat yogurt. Low-fat, low-sodium cheese. Fats and oils Soft margarine without trans fats. Vegetable oil. Reduced-fat, low-fat, or light mayonnaise and salad dressings (reduced-sodium). Canola, safflower, olive, avocado, soybean, and sunflower oils. Avocado. Seasonings and  condiments Herbs. Spices. Seasoning mixes without salt. Other foods Unsalted popcorn and pretzels. Fat-free sweets. The items listed above may not be a complete list of foods and beverages you can eat. Contact a dietitian for more information. What foods should I avoid? Fruits Canned fruit in a light or heavy syrup. Fried fruit. Fruit in cream or butter sauce. Vegetables Creamed or fried vegetables. Vegetables in a cheese sauce. Regular canned vegetables (not low-sodium or reduced-sodium). Regular canned tomato sauce and paste (not low-sodium or reduced-sodium). Regular tomato and vegetable juice (not low-sodium or reduced-sodium). Angie Fava. Olives. Grains [employer]  made with fat, such as croissants, muffins, or some breads. Dry pasta or rice meal packs. Meats and other proteins Fatty cuts of meat. Ribs. Fried meat. Berniece Salines. Bologna, salami, and other precooked or cured meats, such as sausages or meat loaves. Fat from the back of a pig (fatback). Bratwurst. Salted nuts and seeds. Canned beans with added salt. Canned or smoked fish. Whole eggs or egg yolks. Chicken or [place] with skin. Dairy Whole or 2% milk, cream, and half-and-half. Whole or full-fat cream cheese. Whole-fat or sweetened yogurt. Full-fat cheese. Nondairy creamers. Whipped toppings. Processed cheese and cheese spreads. Fats and oils Butter. Stick margarine. Lard. Shortening. Ghee. Bacon fat. Tropical oils, such as coconut, palm kernel, or palm oil. Seasonings and condiments Onion salt, garlic salt, seasoned salt, table salt, and sea salt. Worcestershire sauce. Tartar sauce. Barbecue sauce. Teriyaki sauce. Soy sauce, including reduced-sodium. Steak sauce. Canned and packaged gravies. Fish sauce. Oyster sauce. Cocktail sauce. Store-bought horseradish. Ketchup. Mustard. Meat flavorings and tenderizers. Bouillon cubes. Hot sauces. Pre-made or packaged marinades. Pre-made or packaged taco seasonings. Relishes. Regular salad  dressings. Other foods Salted popcorn and pretzels. The items listed above may not be a complete list of foods and beverages you should avoid. Contact a dietitian for more information. Where to find more information National Heart, Lung, and Blood Institute: https://wilson-eaton.com/ American Heart Association: www.heart.org Academy of Nutrition and Dietetics: www.eatright.[place]: www.kidney.org Summary The DASH eating plan is a healthy eating plan that has been shown to reduce high blood pressure (hypertension). It may also reduce your risk for type 2 diabetes, heart disease, and stroke. When on the DASH eating plan, aim to eat more fresh fruits and vegetables, whole grains, lean proteins, low-fat dairy, and heart-healthy fats. With the DASH eating plan, you should limit salt (sodium) intake to 2,300 mg a day. If you have hypertension, you may need to reduce your sodium intake to 1,500 mg a day. Work with your health care provider or dietitian to adjust your eating plan to your individual calorie needs. This information is not intended to replace advice given to you by your health care provider. Make sure you discuss any questions you have with your health care provider. Document Revised: 02/10/2019 Document Reviewed: 02/10/2019 Elsevier Patient Education  [place]         Signed, Finis Bud, NP  01/28/2022 10:06 AM    [place]

## 2022-01-29 LAB — CBC
Hematocrit: 37.1 % — ABNORMAL LOW (ref 37.5–51.0)
Hemoglobin: 13.5 g/dL (ref 13.0–17.7)
MCH: 38.2 pg — ABNORMAL HIGH (ref 26.6–33.0)
MCHC: 36.4 g/dL — ABNORMAL HIGH (ref 31.5–35.7)
MCV: 105 fL — ABNORMAL HIGH (ref 79–97)
Platelets: 368 10*3/uL (ref 150–450)
RBC: 3.53 x10E6/uL — ABNORMAL LOW (ref 4.14–5.80)
RDW: 14.6 % (ref 11.6–15.4)
WBC: 7.1 10*3/uL (ref 3.4–10.8)

## 2022-01-29 LAB — BASIC METABOLIC PANEL
BUN/Creatinine Ratio: 15 (ref 10–24)
BUN: 19 mg/dL (ref 8–27)
CO2: 24 mmol/L (ref 20–29)
Calcium: 9.1 mg/dL (ref 8.6–10.2)
Chloride: 103 mmol/L (ref 96–106)
Creatinine, Ser: 1.27 mg/dL (ref 0.76–1.27)
Glucose: 112 mg/dL — ABNORMAL HIGH (ref 70–99)
Potassium: 4.4 mmol/L (ref 3.5–5.2)
Sodium: 138 mmol/L (ref 134–144)
eGFR: 61 mL/min/{1.73_m2} (ref >59)

## 2022-01-29 LAB — SEDIMENTATION RATE: Sed Rate: 120 mm/hr — ABNORMAL HIGH (ref 0–30)

## 2022-01-29 LAB — HIGH SENSITIVITY CRP: CRP, High Sensitivity: 164.47 mg/L — ABNORMAL HIGH (ref 0.00–3.00)

## 2022-02-04 DIAGNOSIS — I25119 Atherosclerotic heart disease of native coronary artery with unspecified angina pectoris: Secondary | ICD-10-CM

## 2022-02-04 DIAGNOSIS — Z79899 Other long term (current) drug therapy: Secondary | ICD-10-CM

## 2022-02-05 ENCOUNTER — Other Ambulatory Visit: Payer: Self-pay

## 2022-02-05 DIAGNOSIS — Z79899 Other long term (current) drug therapy: Secondary | ICD-10-CM

## 2022-02-07 ENCOUNTER — Telehealth: Payer: Self-pay | Admitting: Nurse Practitioner

## 2022-02-07 NOTE — Telephone Encounter (Signed)
Called and s/w patient on 02/06/22 regarding new lab work that has been ordered as well as limited Echocardiogram as suggested by his cardiologist, Dr. Radford Pax. I explained why these have been ordered based on his recent CRP and ESR labs that were elevated. He verbalized understanding and was appreciative of my call.   Finis Bud, NP

## 2022-02-09 DIAGNOSIS — Z79899 Other long term (current) drug therapy: Secondary | ICD-10-CM | POA: Diagnosis not present

## 2022-02-10 ENCOUNTER — Telehealth: Payer: Self-pay | Admitting: Adult Health

## 2022-02-10 LAB — ANA: Anti Nuclear Antibody (ANA): NEGATIVE

## 2022-02-10 LAB — RHEUMATOID FACTOR: Rheumatoid fact SerPl-aCnc: <10 IU/mL (ref <14.0)

## 2022-02-10 NOTE — Telephone Encounter (Signed)
Left message for patient to call back and schedule Medicare Annual Wellness Visit (AWV) either virtually or in office. Left  my Jamie Clark number [phone]   Last AWV  03/26/20 please schedule with Nurse Health Adviser   45 min for awv-i and in office appointments 30 min for awv-s  phone/virtual appointments   If patient needs after 5 please schedule with Kriste Basque

## 2022-02-11 ENCOUNTER — Ambulatory Visit (INDEPENDENT_AMBULATORY_CARE_PROVIDER_SITE_OTHER): Payer: Medicare Other | Admitting: Podiatry

## 2022-02-11 DIAGNOSIS — L988 Other specified disorders of the skin and subcutaneous tissue: Secondary | ICD-10-CM | POA: Diagnosis not present

## 2022-02-11 NOTE — Progress Notes (Signed)
Subjective:  Patient ID: Jamie Clark, male    DOB: 10-21-1950,  MRN: [id number]  Chief Complaint  Patient presents with   Toe Pain    Porokeratosis on the right foot 4th interspace     71 y.o. male presents with the above complaint.  Patient presents with skin maceration to right fourth interdigital space.  Patient states that it has been present for past few weeks has progressive gotten worse they have been putting anything on it has not seen anyone else prior to seeing me.  He would like it would like to discuss treatment options.  He has been trying to keep it dry.   Review of Systems: Negative except as noted in the HPI. Denies N/V/F/Ch.  Past Medical History:  Diagnosis Date   Allergy    Anxiety    Depression    Hepatitis C, chronic (HCC)    HIV positive ([place])    Hypertension     Current Outpatient Medications:    abacavir-dolutegravir-lamiVUDine (TRIUMEQ) 600-50-300 MG tablet, Take 1 tablet by mouth daily., Disp: 30 tablet, Rfl: 11   acetaminophen (TYLENOL) 500 MG tablet, Take 2 tablets (1,000 mg total) by mouth every 8 (eight) hours as needed., Disp: 30 tablet, Rfl: 0   amLODipine (NORVASC) 10 MG tablet, Take 1 tablet (10 mg total) by mouth daily., Disp: 90 tablet, Rfl: 3   aspirin EC 81 MG tablet, Take 1 tablet (81 mg total) by mouth daily. Swallow whole., Disp: 30 tablet, Rfl: 12   atorvastatin (LIPITOR) 80 MG tablet, Take 1 tablet (80 mg total) by mouth daily., Disp: 30 tablet, Rfl: 3   buPROPion (WELLBUTRIN XL) 150 MG 24 hr tablet, TAKE 1 TABLET(150 MG) BY MOUTH DAILY, Disp: 90 tablet, Rfl: 1   carvedilol (COREG) 6.25 MG tablet, Take 1 tablet (6.25 mg total) by mouth 2 (two) times daily with a meal., Disp: 60 tablet, Rfl: 3   finasteride (PROSCAR) 5 MG tablet, Take 1 tablet (5 mg total) by mouth daily., Disp: 90 tablet, Rfl: 3   hydrALAZINE (APRESOLINE) 25 MG tablet, Take 1 tablet (25 mg total) by mouth 3 (three) times daily., Disp: 90 tablet, Rfl: 2   irbesartan  (AVAPRO) 300 MG tablet, Take 1 tablet (300 mg total) by mouth daily., Disp: 30 tablet, Rfl: 3   methocarbamol (ROBAXIN) 500 MG tablet, Take 1 tablet (500 mg total) by mouth 2 (two) times daily., Disp: 20 tablet, Rfl: 0   nitroGLYCERIN (NITROSTAT) 0.4 MG SL tablet, Place 1 tablet (0.4 mg total) under the tongue every 5 (five) minutes x 3 doses as needed for chest pain., Disp: 30 tablet, Rfl: 1   prasugrel (EFFIENT) 10 MG TABS tablet, Take 1 tablet (10 mg total) by mouth daily., Disp: 30 tablet, Rfl: 3   tamsulosin (FLOMAX) 0.4 MG CAPS capsule, Take 0.8 mg by mouth at bedtime., Disp: , Rfl:    traZODone (DESYREL) 50 MG tablet, Take 0.5 tablets (25 mg total) by mouth at bedtime., Disp: 45 tablet, Rfl: 1  Social History   Tobacco Use  Smoking Status Light Smoker   Packs/day: 0.15   Types: Cigarettes  Smokeless Tobacco Never  Tobacco Comments   3 per day, working on quitting    No Known Allergies Objective:  There were no vitals filed for this visit. There is no height or weight on file to calculate BMI. Constitutional Well developed. Well nourished.  Vascular Dorsalis pedis pulses palpable bilaterally. Posterior tibial pulses palpable bilaterally. Capillary refill normal to all digits.  No cyanosis or clubbing noted. Pedal hair growth normal.  Neurologic Normal speech. Oriented to person, place, and time. Epicritic sensation to light touch grossly present bilaterally.  Dermatologic Skin maceration noted to right fourth interdigital space.  No signs of infection noted no open wounds or lesion noted.  Orthopedic: Normal joint ROM without pain or crepitus bilaterally. No visible deformities. No bony tenderness.   Radiographs: None Assessment:   1. Skin maceration    Plan:  Patient was evaluated and treated and all questions answered.  Right fourth interdigital space skin maceration -All questions and concerns were discussed with the patient in extensive detail.  I believe  patient will benefit from Betadine wet-to-dry dressing/pain every day for 6 to 8 weeks until resolve meant.  I discussed with patient he states understanding I discussed shoe gear modification and the importance of decreasing hyperhidrosis.  Return in about 3 months (around 05/14/2022) for Routine Foot Care.

## 2022-02-17 ENCOUNTER — Telehealth: Payer: Self-pay | Admitting: *Deleted

## 2022-02-17 ENCOUNTER — Encounter: Payer: Self-pay | Admitting: Physician Assistant

## 2022-02-17 ENCOUNTER — Ambulatory Visit: Payer: Medicare Other | Attending: Physician Assistant | Admitting: Physician Assistant

## 2022-02-17 VITALS — BP 124/64 | HR 82 | Ht 67.0 in | Wt 175.2 lb

## 2022-02-17 DIAGNOSIS — I2510 Atherosclerotic heart disease of native coronary artery without angina pectoris: Secondary | ICD-10-CM | POA: Diagnosis not present

## 2022-02-17 DIAGNOSIS — E785 Hyperlipidemia, unspecified: Secondary | ICD-10-CM | POA: Diagnosis not present

## 2022-02-17 DIAGNOSIS — R400 Somnolence: Secondary | ICD-10-CM | POA: Insufficient documentation

## 2022-02-17 DIAGNOSIS — R630 Anorexia: Secondary | ICD-10-CM

## 2022-02-17 DIAGNOSIS — I10 Essential (primary) hypertension: Secondary | ICD-10-CM

## 2022-02-17 DIAGNOSIS — R0683 Snoring: Secondary | ICD-10-CM | POA: Diagnosis not present

## 2022-02-17 NOTE — Assessment & Plan Note (Signed)
He is status post a non-STEMI in October 2023 treated with a DES to the LAD.  His ejection fraction was normal on echocardiogram.  He continues to have lateral T wave inversions but these are resolving compared to the last electrocardiogram.  He did have some residual chest discomfort when he was last seen.  The symptoms have resolved.  He did have an elevated sed rate and CRP when he was last seen.  Follow-up ANA and RF were normal.  He has a follow-up limited echo to rule out pericardial disease.  Overall, he seems to be making good progress since his myocardial infarction.  He works at [employer] and has been told that he can perform light duty.  I have provided him a note to return to work so that he can continue light duty for the next 3 months.  I have encouraged him to start cardiac rehabilitation.  We again discussed the importance of dual antiplatelet therapy for minimum of 12 months post ACS.  Continue amlodipine 10 mg daily, aspirin 81 mg daily, Lipitor 80 mg daily, carvedilol 6.25 mg twice daily, irbesartan 300 mg daily, Effient 10 mg daily.  Follow-up in 3 months.

## 2022-02-17 NOTE — Telephone Encounter (Signed)
Pt was seen in the office with Tereso Newcomer, [facility] who ordered an Itamar sleep study. Pt agreeable to signed waiver and not opent he box until he has been called with a PIN#.

## 2022-02-17 NOTE — Assessment & Plan Note (Signed)
The patient's blood pressure is controlled on his current regimen.  Continue current therapy.  

## 2022-02-17 NOTE — Assessment & Plan Note (Signed)
Continue high intensity statin therapy with atorvastatin 80 mg daily.  Arrange follow-up CMET, lipid panel

## 2022-02-17 NOTE — Assessment & Plan Note (Addendum)
He notes loss of appetite as well as some weight loss since his heart attack.  He has lost about 5 pounds since October.  Initially, he was somewhat depressed but feels better now.  I have asked him to try to drink Ensure or Boost to help replace calories if he cannot eat.  If he continues to have issues with poor appetite, I have asked him to follow-up with primary care.

## 2022-02-17 NOTE — Patient Instructions (Signed)
Medication Instructions:  Your physician recommends that you continue on your current medications as directed. Please refer to the Current Medication list given to you today.  *If you need a refill on your cardiac medications before your next appointment, please call your pharmacy*   Lab Work: 4-6 WEEKS:  FASTING LIPID & LFT  If you have labs (blood work) drawn today and your tests are completely normal, you will receive your results only by: MyChart Message (if you have MyChart) OR A paper copy in the mail If you have any lab test that is abnormal or we need to change your treatment, we will call you to review the results.   Testing/Procedures: Your physician has recommended that you have a Itamar sleep study. This test records several body functions during sleep, including: brain activity, eye movement, oxygen and carbon dioxide blood levels, heart rate and rhythm, breathing rate and rhythm, the flow of air through your mouth and nose, snoring, body muscle movements, and chest and belly movement.    Follow-Up: At [facility], you and your health needs are our priority.  As part of our continuing mission to provide you with exceptional heart care, we have created designated Provider Care Teams.  These Care Teams include your primary Cardiologist (physician) and Advanced Practice Providers (APPs -  Physician Assistants and Nurse Practitioners) who all work together to provide you with the care you need, when you need it.  We recommend signing up for the patient portal called "MyChart".  Sign up information is provided on this After Visit Summary.  MyChart is used to connect with patients for Virtual Visits (Telemedicine).  Patients are able to view lab/test results, encounter notes, upcoming appointments, etc.  Non-urgent messages can be sent to your provider as well.   To learn more about what you can do with MyChart, go to [email or web address].    Your next appointment:   3  month(s)  The format for your next appointment:   In Person  Provider:   Armanda Magic, MD     Other Instructions   Important Information About Sugar

## 2022-02-17 NOTE — Assessment & Plan Note (Signed)
He has a history of snoring and daytime hypersomnolence.  At last visit, a sleep study was ordered.  He is currently scheduled for a split-night.  He would like to try to arrange a home study if possible.  We will changes sleep study to an Itamar home sleep study.

## 2022-02-17 NOTE — Progress Notes (Signed)
Cardiology Office Note:    Date:  02/17/2022   ID:  Jamie Clark, DOB [birth year], MRN [id number]  PCP:  Dorothyann Peng, NP  [facility] Cardiologist:  Fransico Him, MD     Referring MD: Dorothyann Peng, NP   Chief Complaint:  Follow-up for CAD    Patient Profile: Coronary artery disease  NSTEMI 12/2021 s/p 3 x 16 mm Synergy DES to pLAD  LHC 01/19/2022: Distal LM 15, proximal LAD 90, mid LAD 20>> PCI: 3 x 16 mm Synergy DES to the proximal LAD Echo 01/20/2022: EF 55-60, no RWMA, moderate asymmetric LVH, GR 1 DD, normal RVSF, AV sclerosis without stenosis, RAP 3 HIV Chronic Hep C Depression/anxiety  Hypertension  Hyperlipidemia   Cardiac Studies & Procedures   CARDIAC CATHETERIZATION  CARDIAC CATHETERIZATION 01/19/2022  Narrative Conclusions: Severe single-vessel coronary artery disease with 90% proximal LAD stenosis.  There is mild, nonobstructive disease involving the distal LMCA and mid LAD.  LCx and RCA are tortuous with mild luminal irregularities. Normal left ventricular systolic function (LVEF 40-98%) with upper normal filling pressure (LVEDP 15 mmHg). Successful PCI to proximal LAD using Synergy 3.0 x 16 mm drug-eluting stent (postdilated to 3.3 mm) with 0% residual stenosis and TIMI-3 flow.  Recommendations: Remove right femoral artery sheath with manual compression when ACT has fallen below 175 seconds. Dual antiplatelet therapy with aspirin and prasugrel for at least 12 months. Aggressive secondary prevention. Follow-up echocardiogram.  Nelva Bush, MD [facility]  Findings Coronary Findings Diagnostic  Dominance: Right  Left Main Vessel is large. Dist LM lesion is 15% stenosed.  Left Anterior Descending Vessel is large. Prox LAD lesion is 90% stenosed. Vessel is the culprit lesion. The lesion is thrombotic. Mid LAD lesion is 20% stenosed.  First Diagonal Branch Vessel is small in size. Vessel is angiographically  normal.  Second Diagonal Branch Vessel is moderate in size.  Third Diagonal Branch Vessel is moderate in size.  Left Circumflex Vessel is large. The vessel exhibits minimal luminal irregularities. The vessel is moderately tortuous.  First Obtuse Marginal Branch Vessel is large in size. There is mild disease in the vessel.  Second Obtuse Marginal Branch Vessel is moderate in size.  Third Obtuse Marginal Branch Vessel is small in size.  Right Coronary Artery Vessel is moderate in size. Vessel is angiographically normal. The vessel is severely tortuous.  Right Posterior Descending Artery Vessel is moderate in size.  Right Posterior Atrioventricular Artery Vessel is large in size.  First Right Posterolateral Branch Vessel is moderate in size.  Second Right Posterolateral Branch Vessel is small in size.  Intervention  Prox LAD lesion Stent CATH VISTA GUIDE 6FR XBLAD4 guide catheter was inserted. Lesion crossed with guidewire using a WIRE HI TORQ BMW 190CM. Pre-stent angioplasty was performed using a BALLN SAPPHIRE 2.5X12. Maximum pressure:  12 atm. A drug-eluting stent was successfully placed using a SYNERGY XD 3.0X16. Maximum pressure: 14 atm. Stent strut is well apposed. Post-stent angioplasty was performed using a BALL SAPPHIRE NC24 3.25X12. Maximum pressure:  16 atm. Post-Intervention Lesion Assessment The intervention was successful. Pre-interventional TIMI flow is 3. Post-intervention TIMI flow is 3. No complications occurred at this lesion. There is a 0% residual stenosis post intervention.     ECHOCARDIOGRAM  ECHOCARDIOGRAM COMPLETE 01/20/2022  Narrative ECHOCARDIOGRAM REPORT    Patient Name:   Jamie Clark Date of Exam: 01/20/2022 Medical Rec #:  [id number]          Height:       67.0  in Accession #:    [id number]         Weight:       180.0 lb Date of Birth:  08/30/1950          BSA:          1.934 m Patient Age:    71 years           BP:            143/72 mmHg Patient Gender: M                  HR:           85 bpm. Exam Location:  Inpatient  Procedure: 2D Echo, Color Doppler and Cardiac Doppler  Indications:    NSTEMI i21.4  History:        Patient has no prior history of Echocardiogram examinations. Risk Factors:Hypertension and Dyslipidemia.  Sonographer:    Raquel Sarna Senior RDCS Referring Phys: [place]   1. Left ventricular ejection fraction, by estimation, is 55 to 60%. The left ventricle has normal function. The left ventricle has no regional wall motion abnormalities. There is moderate asymmetric left ventricular hypertrophy. Left ventricular diastolic parameters are consistent with Grade I diastolic dysfunction (impaired relaxation). 2. Right ventricular systolic function is normal. The right ventricular size is normal. 3. The mitral valve is normal in structure. No evidence of mitral valve regurgitation. No evidence of mitral stenosis. 4. The aortic valve is tricuspid. There is mild calcification of the aortic valve. There is mild thickening of the aortic valve. Aortic valve regurgitation is not visualized. Aortic valve sclerosis/calcification is present, without any evidence of aortic stenosis. 5. The inferior vena cava is normal in size with greater than 50% respiratory variability, suggesting right atrial pressure of 3 mmHg.  Comparison(s): No prior Echocardiogram.  FINDINGS Left Ventricle: Left ventricular ejection fraction, by estimation, is 55 to 60%. The left ventricle has normal function. The left ventricle has no regional wall motion abnormalities. The left ventricular internal cavity size was normal in size. There is moderate asymmetric left ventricular hypertrophy. Left ventricular diastolic parameters are consistent with Grade I diastolic dysfunction (impaired relaxation).  Right Ventricle: The right ventricular size is normal. No increase in right ventricular wall thickness. Right  ventricular systolic function is normal.  Left Atrium: Left atrial size was normal in size.  Right Atrium: Right atrial size was normal in size.  Pericardium: There is no evidence of pericardial effusion. Presence of epicardial fat layer.  Mitral Valve: The mitral valve is normal in structure. No evidence of mitral valve regurgitation. No evidence of mitral valve stenosis.  Tricuspid Valve: The tricuspid valve is normal in structure. Tricuspid valve regurgitation is not demonstrated. No evidence of tricuspid stenosis.  Aortic Valve: The aortic valve is tricuspid. There is mild calcification of the aortic valve. There is mild thickening of the aortic valve. Aortic valve regurgitation is not visualized. Aortic valve sclerosis/calcification is present, without any evidence of aortic stenosis.  Pulmonic Valve: The pulmonic valve was not well visualized. Pulmonic valve regurgitation is not visualized.  Aorta: The aortic root and ascending aorta are structurally normal, with no evidence of dilitation.  Venous: The inferior vena cava is normal in size with greater than 50% respiratory variability, suggesting right atrial pressure of 3 mmHg.  IAS/Shunts: No atrial level shunt detected by color flow Doppler.   LEFT VENTRICLE PLAX 2D LVIDd:         3.70 cm  Diastology LVIDs:         1.90 cm   LV e' medial:    5.87 cm/s LV PW:         1.20 cm   LV E/e' medial:  11.8 LV IVS:        1.30 cm   LV e' lateral:   8.81 cm/s LVOT diam:     2.30 cm   LV E/e' lateral: 7.8 LV SV:         102 LV SV Index:   53 LVOT Area:     4.15 cm   RIGHT VENTRICLE RV S prime:     19.50 cm/s TAPSE (M-mode): 2.4 cm  LEFT ATRIUM             Index        RIGHT ATRIUM           Index LA diam:        3.30 cm 1.71 cm/m   RA Area:     13.90 cm LA Vol (A2C):   50.2 ml 25.96 ml/m  RA Volume:   33.30 ml  17.22 ml/m LA Vol (A4C):   31.4 ml 16.24 ml/m LA Biplane Vol: 43.0 ml 22.24 ml/m AORTIC VALVE LVOT Vmax:    101.00 cm/s LVOT Vmean:  73.600 cm/s LVOT VTI:    0.245 m  AORTA Ao Root diam: 3.60 cm Ao Asc diam:  3.60 cm  MITRAL VALVE MV Area (PHT): 2.41 cm    SHUNTS MV Decel Time: 315 msec    Systemic VTI:  0.24 m MV E velocity: 69.00 cm/s  Systemic Diam: 2.30 cm MV A velocity: 95.70 cm/s MV E/A ratio:  0.72  Rudean Haskell MD Electronically signed by Rudean Haskell MD Signature Date/Time: 01/20/2022/2:54:17 PM    Final              History of Present Illness:   Darragh Nay is a 71 y.o. male with the above problem list.  He was admitted 01/17/2022-01/20/2022 with a non-STEMI.  Cardiac catheterization demonstrated single-vessel disease and he underwent PCI with a DES to the proximal LAD.  He was seen in follow-up 01/28/2022 by Finis Bud, NP.  He was still having some chest discomfort and shortness of breath.  He tried nitroglycerin on 1 occasion and had a syncopal episode.  His amlodipine dose was increased to 10 mg daily a sed rate and CRP were both obtained.  These were significantly elevated (ESR 120; CRP 164.47).  It was question whether this was due to his recent MI.  Case was reviewed with Dr. Radford Pax.  ANA and RF were both obtained.  These were normal.  Follow-up limited echo is pending to rule out pericardial disease.  Of note, sleep study was also arranged due to fatigue.  He returns for follow-up.  He is here today with his fiance.  His granddaughter also joined the visit by speaker phone.  He is feeling much better.  He has had some issues with shortness of breath, especially the mornings.  This is a chronic symptom without change.  He has not had any further chest pain.  He has not had pleuritic chest pain or chest pain with lying supine.  He has not had syncope, orthopnea, leg edema.        Past Medical History:  Diagnosis Date   Allergy    Anxiety    Depression    Hepatitis C, chronic ([place])    HIV positive ([place])  Hypertension    Current  Medications: Current Meds  Medication Sig   abacavir-dolutegravir-lamiVUDine (TRIUMEQ) 600-50-300 MG tablet Take 1 tablet by mouth daily.   acetaminophen (TYLENOL) 500 MG tablet Take 2 tablets (1,000 mg total) by mouth every 8 (eight) hours as needed.   amLODipine (NORVASC) 10 MG tablet Take 1 tablet (10 mg total) by mouth daily.   aspirin EC 81 MG tablet Take 1 tablet (81 mg total) by mouth daily. Swallow whole.   atorvastatin (LIPITOR) 80 MG tablet Take 1 tablet (80 mg total) by mouth daily.   buPROPion (WELLBUTRIN XL) 150 MG 24 hr tablet TAKE 1 TABLET(150 MG) BY MOUTH DAILY   carvedilol (COREG) 6.25 MG tablet Take 1 tablet (6.25 mg total) by mouth 2 (two) times daily with a meal.   finasteride (PROSCAR) 5 MG tablet Take 1 tablet (5 mg total) by mouth daily.   hydrALAZINE (APRESOLINE) 25 MG tablet Take 1 tablet (25 mg total) by mouth 3 (three) times daily.   irbesartan (AVAPRO) 300 MG tablet Take 1 tablet (300 mg total) by mouth daily.   methocarbamol (ROBAXIN) 500 MG tablet Take 1 tablet (500 mg total) by mouth 2 (two) times daily.   nitroGLYCERIN (NITROSTAT) 0.4 MG SL tablet Place 1 tablet (0.4 mg total) under the tongue every 5 (five) minutes x 3 doses as needed for chest pain.   prasugrel (EFFIENT) 10 MG TABS tablet Take 1 tablet (10 mg total) by mouth daily.   tamsulosin (FLOMAX) 0.4 MG CAPS capsule Take 0.8 mg by mouth at bedtime.   traZODone (DESYREL) 50 MG tablet Take 0.5 tablets (25 mg total) by mouth at bedtime.    Allergies:   Patient has no known allergies.   Social History   Occupational History   Not on file  Tobacco Use   Smoking status: Light Smoker    Packs/day: 0.15    Types: Cigarettes   Smokeless tobacco: Never   Tobacco comments:    3 per day, working on quitting  Vaping Use   Vaping Use: Never used  Substance and Sexual Activity   Alcohol use: No    Alcohol/week: 0.0 standard drinks of alcohol   Drug use: No   Sexual activity: Not on file    Comment:  given condoms 05/2020    Family Hx: The patient's family history includes Cancer in his father and mother; Esophageal cancer in his mother; Hypertension in his father and mother. There is no history of Colon cancer, Colon polyps, Rectal cancer, or Stomach cancer.  Review of Systems  Constitutional: Positive for decreased appetite.  Gastrointestinal:  Negative for hematochezia and melena.  Genitourinary:  Negative for hematuria.     EKGs/Labs/Other Test Reviewed:    EKG:  EKG is ordered today.  The ekg ordered today demonstrates NSR, HR 82, left axis deviation, anteroseptal Q waves, lateral T wave inversions-less prominent on this electrocardiogram compared to the last (evolving MI), QTc 418   Recent Labs: 01/18/2022: ALT 18; Magnesium 1.9; TSH 0.833 01/28/2022: BUN 19; Creatinine, Ser 1.27; Hemoglobin 13.5; Platelets 368; Potassium 4.4; Sodium 138   Recent Lipid Panel Recent Labs    01/18/22 0529  CHOL 200  TRIG 108  HDL 28*  VLDL 22  LDLCALC NOT CALCULATED      Risk Assessment/Calculations/Metrics:     STOP-Bang Score:  5           Physical Exam:    VS:  BP 124/64   Pulse 82   Ht _0  (  1.702 m)   Wt 175 lb 3.2 oz (79.5 kg)   SpO2 92%   BMI 27.44 kg/m     Wt Readings from Last 3 Encounters:  02/17/22 175 lb 3.2 oz (79.5 kg)  01/28/22 177 lb (80.3 kg)  01/17/22 180 lb (81.6 kg)    Constitutional:      Appearance: Healthy appearance. Not in distress.  Neck:     Vascular: JVD normal.  Pulmonary:     Effort: Pulmonary effort is normal.     Breath sounds: No wheezing. No rales.  Cardiovascular:     Normal rate. Regular rhythm. Normal S1. Normal S2.      Murmurs: There is no murmur.  Edema:    Peripheral edema absent.  Abdominal:     Palpations: Abdomen is soft.  Skin:    General: Skin is warm and dry.  Neurological:     General: No focal deficit present.     Mental Status: Alert and oriented to person, place and time.         ASSESSMENT & PLAN:    Coronary artery disease involving native coronary artery of native heart without angina pectoris He is status post a non-STEMI in October 2023 treated with a DES to the LAD.  His ejection fraction was normal on echocardiogram.  He continues to have lateral T wave inversions but these are resolving compared to the last electrocardiogram.  He did have some residual chest discomfort when he was last seen.  The symptoms have resolved.  He did have an elevated sed rate and CRP when he was last seen.  Follow-up ANA and RF were normal.  He has a follow-up limited echo to rule out pericardial disease.  Overall, he seems to be making good progress since his myocardial infarction.  He works at [employer] and has been told that he can perform light duty.  I have provided him a note to return to work so that he can continue light duty for the next 3 months.  I have encouraged him to start cardiac rehabilitation.  We again discussed the importance of dual antiplatelet therapy for minimum of 12 months post ACS.  Continue amlodipine 10 mg daily, aspirin 81 mg daily, Lipitor 80 mg daily, carvedilol 6.25 mg twice daily, irbesartan 300 mg daily, Effient 10 mg daily.  Follow-up in 3 months.  Snoring He has a history of snoring and daytime hypersomnolence.  At last visit, a sleep study was ordered.  He is currently scheduled for a split-night.  He would like to try to arrange a home study if possible.  We will changes sleep study to an [facility] home sleep study.  Essential hypertension The patient's blood pressure is controlled on his current regimen.  Continue current therapy.   Hyperlipidemia LDL goal <70 Continue high intensity statin therapy with atorvastatin 80 mg daily.  Arrange follow-up CMET, lipid panel  Poor appetite He notes loss of appetite as well as some weight loss since his heart attack.  He has lost about 5 pounds since October.  Initially, he was somewhat depressed but feels better now.  I have asked him to try  to drink Ensure or Boost to help replace calories if he cannot eat.  If he continues to have issues with poor appetite, I have asked him to follow-up with primary care.     Cardiac Rehabilitation Eligibility Assessment  The patient is ready to start cardiac rehabilitation from a cardiac standpoint. -- (No longer having chest pain)  Dispo:  Return in about 3 months (around 05/20/2022) for Routine Follow Up with Dr. Radford Pax.   Medication Adjustments/Labs and Tests Ordered: Current medicines are reviewed at length with the patient today.  Concerns regarding medicines are outlined above.  Tests Ordered: Orders Placed This Encounter  Procedures   Comp Met (CMET)   Lipid panel   EKG 12-Lead   [facility] Sleep Study   Medication Changes: No orders of the defined types were placed in this encounter.  Signed, Richardson Dopp, PA-C  02/17/2022 6:09 PM    [address], [place], [place]  [address] Phone: [phone]; Fax: [phone]

## 2022-02-25 ENCOUNTER — Ambulatory Visit (HOSPITAL_COMMUNITY): Payer: Medicare Other | Attending: Cardiovascular Disease

## 2022-02-25 DIAGNOSIS — I25119 Atherosclerotic heart disease of native coronary artery with unspecified angina pectoris: Secondary | ICD-10-CM | POA: Diagnosis present

## 2022-02-25 LAB — ECHOCARDIOGRAM LIMITED
Area-P 1/2: 3.03 cm2
S' Lateral: 2.5 cm

## 2022-02-25 NOTE — Telephone Encounter (Signed)
Prior Authorization for Automatic Data sent to [facility] via web portal. Tracking Number READY TO SCHEDULE-NO PA REQ.

## 2022-02-27 NOTE — Telephone Encounter (Signed)
Left message for the pt that he can proceed with the [facility] sleep study. Left PIN # [id number] on vm. Left message for pt to try and complete sleep study maybe by early next week at the latest. Once the results are in we will call pt with the results and any recommendations.

## 2022-03-03 ENCOUNTER — Encounter (HOSPITAL_BASED_OUTPATIENT_CLINIC_OR_DEPARTMENT_OTHER): Payer: Medicare Other | Admitting: Cardiovascular Disease

## 2022-03-05 ENCOUNTER — Ambulatory Visit (HOSPITAL_COMMUNITY)
Admission: EM | Admit: 2022-03-05 | Discharge: 2022-03-05 | Disposition: A | Discharge status: Home / Self Care | Payer: Medicare Other | Attending: Urgent Care | Admitting: Urgent Care

## 2022-03-05 ENCOUNTER — Encounter (HOSPITAL_COMMUNITY): Payer: Self-pay | Admitting: *Deleted

## 2022-03-05 DIAGNOSIS — K121 Other forms of stomatitis: Secondary | ICD-10-CM | POA: Diagnosis not present

## 2022-03-05 DIAGNOSIS — H5711 Ocular pain, right eye: Secondary | ICD-10-CM | POA: Diagnosis not present

## 2022-03-05 DIAGNOSIS — J069 Acute upper respiratory infection, unspecified: Secondary | ICD-10-CM | POA: Diagnosis not present

## 2022-03-05 MED ORDER — BENZONATATE 100 MG PO CAPS: 100.0000 mg | ORAL_CAPSULE | Freq: Three times a day (TID) | ORAL | 0 refills | Status: DC | PRN

## 2022-03-05 MED ORDER — LIDOCAINE VISCOUS HCL 2 % MT SOLN: 5.0000 mL | Freq: Three times a day (TID) | OROMUCOSAL | 0 refills | Status: DC | PRN

## 2022-03-05 MED ORDER — FLUORESCEIN SODIUM 1 MG OP STRP
ORAL_STRIP | OPHTHALMIC | Status: AC
  Filled 2022-03-05: qty 1

## 2022-03-05 MED ORDER — TRIAMCINOLONE ACETONIDE 0.1 % MT PSTE: PASTE | OROMUCOSAL | 0 refills | Status: DC

## 2022-03-05 NOTE — Discharge Instructions (Addendum)
Your symptoms are most likely related to a viral upper respiratory infection. I have prescribed you a medication to help with the ulcers on your tongue. I have also given you something for the cough. I am concerned about your right eye however. There is possibility of a viral infection in the eye that would warrant a further evaluation as soon as possible. Please call the ophthalmologist first thing in the morning to get a further evaluation. Failure to get the appropriate eye exam could lead to serious complications.

## 2022-03-05 NOTE — ED Triage Notes (Signed)
Pt states he has had a pink right eye since Tuesday . He developed cough, congestion, and bumps on his tongue, sore throat since yesterday. He is taking alka seltzer, tylneol PM.

## 2022-03-05 NOTE — ED Provider Notes (Signed)
[place]    CSN: [id number] Arrival date & time: 03/05/22  1719      History   Chief Complaint Chief Complaint  Patient presents with   Cough   Nasal Congestion   Conjunctivitis    HPI Jamie Clark is a 71 y.o. male.   Pleasant 71 year old male with a history of hypertension, CAD, hepatitis C, and HIV presents today primarily due to concerns of his right eye.  He states that on Tuesday he had eye pain and photophobia.  He also reported blurred vision.  He states that the pain in the photophobia has improved, but has not resolved completely.  He denies a history of glaucoma.  He does not wear contact lenses.  Denies any trauma or injury to the eye.  He also endorses cough, sore throat, congestion, and painful bumps on his tongue that hurt worse when eating tomato products. He denies fever, SOB, palpitations. He took theraflu this morning with minimal relief. He denies myalgias, chills, body aches, GI symptoms. No known sick contacts.   Cough Associated symptoms: rhinorrhea and sore throat   Associated symptoms: no chills and no fever   Conjunctivitis    Past Medical History:  Diagnosis Date   Allergy    Anxiety    Depression    Hepatitis C, chronic (HCC)    HIV positive ([place])    Hypertension     Patient Active Problem List   Diagnosis Date Noted   Coronary artery disease involving native coronary artery of native heart without angina pectoris 02/17/2022   Daytime sleepiness 02/17/2022   Snoring 02/17/2022   Poor appetite 02/17/2022   Hx of NSTEMI 12/2021 s/p PCI to LAD 01/18/2022   Insomnia 04/24/2020   Grief at loss of child 07/04/2019   Anxiety 12/22/2018   Prostatic hypertrophy 12/22/2018   Prediabetes 08/22/2018   Screen for STD (sexually transmitted disease) 08/22/2018   Hyperlipidemia LDL goal <70 01/17/2009   Essential hypertension 01/17/2009   HIV infection ([place]) 12/18/2008   Hepatitis C virus infection cured after antiviral drug  therapy 07/13/2007    Past Surgical History:  Procedure Laterality Date   CORONARY STENT INTERVENTION N/A 01/19/2022   Procedure: CORONARY STENT INTERVENTION;  Surgeon: Nelva Bush, MD;  Location: [facility];  Service: Cardiovascular;  Laterality: N/A;   LEFT HEART CATH AND CORONARY ANGIOGRAPHY N/A 01/19/2022   Procedure: LEFT HEART CATH AND CORONARY ANGIOGRAPHY;  Surgeon: Nelva Bush, MD;  Location: [facility];  Service: Cardiovascular;  Laterality: N/A;   NO PAST SURGERIES         Home Medications    Prior to Admission medications   Medication Sig Start Date End Date Taking? Authorizing Provider  abacavir-dolutegravir-lamiVUDine (TRIUMEQ) 600-50-300 MG tablet Take 1 tablet by mouth daily. 12/18/21  Yes [place] Callas, NP  amLODipine (NORVASC) 10 MG tablet Take 1 tablet (10 mg total) by mouth daily. 01/28/22  Yes Finis Bud, NP  aspirin EC 81 MG tablet Take 1 tablet (81 mg total) by mouth daily. Swallow whole. 01/21/22  Yes Lily Kocher, PA-C  atorvastatin (LIPITOR) 80 MG tablet Take 1 tablet (80 mg total) by mouth daily. 01/21/22  Yes Lily Kocher, PA-C  benzonatate (TESSALON PERLES) 100 MG capsule Take 1 capsule (100 mg total) by mouth 3 (three) times daily as needed for cough. 03/05/22  Yes Marcellina Jonsson L, PA  carvedilol (COREG) 6.25 MG tablet Take 1 tablet (6.25 mg total) by mouth 2 (two) times daily with a  meal. 01/20/22  Yes Lily Kocher, PA-C  finasteride (PROSCAR) 5 MG tablet Take 1 tablet (5 mg total) by mouth daily. 08/26/21  Yes Nafziger, Tommi Rumps, NP  irbesartan (AVAPRO) 300 MG tablet Take 1 tablet (300 mg total) by mouth daily. 01/21/22  Yes Lily Kocher, PA-C  magic mouthwash (lidocaine, diphenhydrAMINE, alum & mag hydroxide) suspension Swish and spit 5 mLs 3 (three) times daily as needed for mouth pain. 03/05/22  Yes Brailynn Breth L, PA  prasugrel (EFFIENT) 10 MG TABS tablet Take 1 tablet (10 mg total) by mouth daily. 01/21/22  Yes  Lily Kocher, PA-C  tamsulosin (FLOMAX) 0.4 MG CAPS capsule Take 0.8 mg by mouth at bedtime. 12/05/20  Yes [provider]  traZODone (DESYREL) 50 MG tablet Take 0.5 tablets (25 mg total) by mouth at bedtime. 01/09/22 04/09/22 Yes Nafziger, Tommi Rumps, NP  triamcinolone (KENALOG) 0.1 % paste Apply one application to the affected ulcers twice daily until resolved, not to exceed 10 days 03/05/22  Yes Keaten Mashek L, PA  nitroGLYCERIN (NITROSTAT) 0.4 MG SL tablet Place 1 tablet (0.4 mg total) under the tongue every 5 (five) minutes x 3 doses as needed for chest pain. 01/20/22   Lily Kocher, PA-C    Family History Family History  Problem Relation Age of Onset   Cancer Mother    Hypertension Mother    Esophageal cancer Mother    Cancer Father    Hypertension Father    Colon cancer Neg Hx    Colon polyps Neg Hx    Rectal cancer Neg Hx    Stomach cancer Neg Hx     Social History Social History   Tobacco Use   Smoking status: Light Smoker    Packs/day: 0.15    Types: Cigarettes   Smokeless tobacco: Never   Tobacco comments:    3 per day, working on quitting  Vaping Use   Vaping Use: Never used  Substance Use Topics   Alcohol use: No    Alcohol/week: 0.0 standard drinks of alcohol   Drug use: No     Allergies   Patient has no known allergies.   Review of Systems Review of Systems  Constitutional:  Negative for chills, fatigue and fever.  HENT:  Positive for congestion, mouth sores, rhinorrhea and sore throat.   Eyes:  Positive for photophobia, pain and redness.  Respiratory:  Positive for cough.   All other systems reviewed and are negative.    Physical Exam Triage Vital Signs ED Triage Vitals  Enc Vitals Group     BP 03/05/22 1938 117/74     Pulse Rate 03/05/22 1938 87     Resp 03/05/22 1938 18     Temp 03/05/22 1938 98.6 F (37 C)     Temp Source 03/05/22 1938 Oral     SpO2 03/05/22 1938 96 %     Weight --      Height --      Head Circumference --       Peak Flow --      Pain Score 03/05/22 1934 6     Pain Loc --      Pain Edu? --      Excl. in [place]? --    No data found.  Updated Vital Signs BP 117/74 (BP Location: Left Arm)   Pulse 87   Temp 98.6 F (37 C) (Oral)   Resp 18   SpO2 96%   Visual Acuity Right Eye Distance: 20/70 Left Eye Distance: 20/30 Bilateral  Distance: 20/70  Right Eye Near:   Left Eye Near:    Bilateral Near:     Physical Exam Vitals and nursing note reviewed. Exam conducted with a chaperone present.  Constitutional:      General: He is not in acute distress.    Appearance: Normal appearance. He is normal weight. He is not ill-appearing, toxic-appearing or diaphoretic.  HENT:     Head: Normocephalic and atraumatic.     Right Ear: Ear canal and external ear normal. There is impacted cerumen.     Left Ear: Ear canal and external ear normal. There is impacted cerumen.     Nose: Nose normal. No congestion or rhinorrhea.     Mouth/Throat:     Mouth: Mucous membranes are moist.     Pharynx: Oropharynx is clear. No oropharyngeal exudate or posterior oropharyngeal erythema.     Comments: Two shallow ulcerations noted to lateral tip of tongue. Buccal mucosa WNL Eyes:     General: Lids are normal. Lids are everted, no foreign bodies appreciated. No allergic shiner.       Right eye: No foreign body, discharge or hordeolum.        Left eye: No foreign body, discharge or hordeolum.     Intraocular pressure: Right eye pressure is 14 mmHg. Measurements were taken using a handheld tonometer.    Extraocular Movements: Extraocular movements intact.     Right eye: Normal extraocular motion and no nystagmus.     Left eye: Normal extraocular motion and no nystagmus.     Conjunctiva/sclera:     Right eye: Right conjunctiva is injected. No chemosis, exudate or hemorrhage.    Left eye: Left conjunctiva is not injected. No chemosis, exudate or hemorrhage.    Pupils: Pupils are equal, round, and reactive to light.  Pupils are equal.     Right eye: Pupil is round, reactive and not sluggish. No corneal abrasion or fluorescein uptake.     Left eye: Pupil is round, reactive and not sluggish. No corneal abrasion.     Comments: Arcus senilus bilateral eyes Decreased vision on R  Cardiovascular:     Rate and Rhythm: Normal rate and regular rhythm.     Pulses: Normal pulses.     Heart sounds: No murmur heard. Pulmonary:     Effort: Pulmonary effort is normal. No respiratory distress.     Breath sounds: Normal breath sounds. No wheezing, rhonchi or rales.  Chest:     Chest wall: No tenderness.  Abdominal:     General: Abdomen is flat. There is no distension.     Tenderness: There is no abdominal tenderness.  Musculoskeletal:     Cervical back: Normal range of motion and neck supple. No rigidity or tenderness.  Lymphadenopathy:     Cervical: No cervical adenopathy.  Skin:    General: Skin is warm and dry.     Capillary Refill: Capillary refill takes less than 2 seconds.     Coloration: Skin is not jaundiced.     Findings: No bruising, erythema or rash.  Neurological:     General: No focal deficit present.     Mental Status: He is alert and oriented to person, place, and time.     Sensory: No sensory deficit.     Motor: No weakness.  Psychiatric:        Mood and Affect: Mood normal.      UC Treatments / Results  Labs (all labs ordered are listed, but only abnormal results  are displayed) Labs Reviewed - No data to display  EKG   Radiology No results found.  Procedures Procedures (including critical care time)  Medications Ordered in UC Medications - No data to display  Initial Impression / Assessment and Plan / UC Course  I have reviewed the triage vital signs and the nursing notes.  Pertinent labs & imaging results that were available during my care of the patient were reviewed by me and considered in my medical decision making (see chart for details).     Viral URI with cough  - VSS, clinically pt appears stable. Suspect viral URI as cause for symptoms. Supportive measures appropriate R eye pain - pt with improving pain and photophobia. I have concern that his viral symptoms may be related to CMV, and most concerning for CMV retinitis as cause of his eye symptoms. He admits his sx have improved over the past two days, but still present. Fluorescein stain was normal. Ocular pressure was normal. Decreased visual acuity on R. Will refer to ophthalmology first thing in the morning, ER if any worsening symptoms Tongue ulcerations - will do magic mouthwash and topical triamcinolone dental paste.   Final Clinical Impressions(s) / UC Diagnoses   Final diagnoses:  Viral URI with cough  Eye pain, right  Mouth ulcer     Discharge Instructions      Your symptoms are most likely related to a viral upper respiratory infection. I have prescribed you a medication to help with the ulcers on your tongue. I have also given you something for the cough. I am concerned about your right eye however. There is possibility of a viral infection in the eye that would warrant a further evaluation as soon as possible. Please call the ophthalmologist first thing in the morning to get a further evaluation. Failure to get the appropriate eye exam could lead to serious complications.      ED Prescriptions     Medication Sig Dispense Auth. Provider   magic mouthwash (lidocaine, diphenhydrAMINE, alum & mag hydroxide) suspension Swish and spit 5 mLs 3 (three) times daily as needed for mouth pain. 120 mL Tahlor Berenguer L, PA   benzonatate (TESSALON PERLES) 100 MG capsule Take 1 capsule (100 mg total) by mouth 3 (three) times daily as needed for cough. 20 capsule Tionne Dayhoff L, PA   triamcinolone (KENALOG) 0.1 % paste Apply one application to the affected ulcers twice daily until resolved, not to exceed 10 days 5 g Jacquez Sheetz L, PA      PDMP not reviewed this encounter.   Maretta Bees, [place] 03/05/22 2114

## 2022-03-26 ENCOUNTER — Telehealth (HOSPITAL_COMMUNITY): Payer: Self-pay

## 2022-03-26 NOTE — Telephone Encounter (Signed)
Pt is not interested in the cardiac rehab program. Closed referral 

## 2022-03-31 ENCOUNTER — Ambulatory Visit: Payer: 59 | Attending: Physician Assistant

## 2022-03-31 DIAGNOSIS — I2510 Atherosclerotic heart disease of native coronary artery without angina pectoris: Secondary | ICD-10-CM

## 2022-03-31 LAB — COMPREHENSIVE METABOLIC PANEL
ALT: 31 IU/L (ref 0–44)
AST: 28 IU/L (ref 0–40)
Albumin/Globulin Ratio: 1.1 — ABNORMAL LOW (ref 1.2–2.2)
Albumin: 4.2 g/dL (ref 3.9–4.9)
Alkaline Phosphatase: 115 IU/L (ref 44–121)
BUN/Creatinine Ratio: 16 (ref 10–24)
BUN: 18 mg/dL (ref 8–27)
Bilirubin Total: 0.5 mg/dL (ref 0.0–1.2)
CO2: 22 mmol/L (ref 20–29)
Calcium: 9.4 mg/dL (ref 8.6–10.2)
Chloride: 105 mmol/L (ref 96–106)
Creatinine, Ser: 1.16 mg/dL (ref 0.76–1.27)
Globulin, Total: 3.7 g/dL (ref 1.5–4.5)
Glucose: 101 mg/dL — ABNORMAL HIGH (ref 70–99)
Potassium: 4 mmol/L (ref 3.5–5.2)
Sodium: 141 mmol/L (ref 134–144)
Total Protein: 7.9 g/dL (ref 6.0–8.5)
eGFR: 68 mL/min/{1.73_m2} (ref >59)

## 2022-03-31 LAB — LIPID PANEL
Chol/HDL Ratio: 6.1 ratio — ABNORMAL HIGH (ref 0.0–5.0)
Cholesterol, Total: 212 mg/dL — ABNORMAL HIGH (ref 100–199)
HDL: 35 mg/dL — ABNORMAL LOW (ref >39)
LDL Chol Calc (NIH): 156 mg/dL — ABNORMAL HIGH (ref 0–99)
Triglycerides: 115 mg/dL (ref 0–149)
VLDL Cholesterol Cal: 21 mg/dL (ref 5–40)

## 2022-04-01 DIAGNOSIS — Z79899 Other long term (current) drug therapy: Secondary | ICD-10-CM

## 2022-04-01 MED ORDER — ATORVASTATIN CALCIUM 80 MG PO TABS: 80.0000 mg | ORAL_TABLET | Freq: Every day | ORAL | 3 refills | Status: DC

## 2022-04-01 NOTE — Telephone Encounter (Signed)
-----   Message from Liliane Shi, [place] sent at 03/31/2022  6:07 PM EST ----- Creatinine, K+, LFTs normal. LDL is too high. PLAN:  - Please ask patient if he is taking Atorvastatin (Lipitor) 80 mg once daily (every day)  Richardson Dopp, PA-C    03/31/2022 6:04 PM

## 2022-04-17 ENCOUNTER — Encounter: Payer: Self-pay | Admitting: Adult Health

## 2022-04-17 ENCOUNTER — Ambulatory Visit (INDEPENDENT_AMBULATORY_CARE_PROVIDER_SITE_OTHER): Payer: 59 | Admitting: Adult Health

## 2022-04-17 VITALS — BP 126/60 | HR 95 | Temp 97.6°F | Ht 67.0 in | Wt 184.0 lb

## 2022-04-17 DIAGNOSIS — R197 Diarrhea, unspecified: Secondary | ICD-10-CM | POA: Diagnosis not present

## 2022-04-17 DIAGNOSIS — R350 Frequency of micturition: Secondary | ICD-10-CM | POA: Diagnosis not present

## 2022-04-17 DIAGNOSIS — R1084 Generalized abdominal pain: Secondary | ICD-10-CM | POA: Diagnosis not present

## 2022-04-17 LAB — POCT URINALYSIS DIPSTICK
Bilirubin, UA: NEGATIVE
Blood, UA: NEGATIVE
Glucose, UA: NEGATIVE
Ketones, UA: POSITIVE
Nitrite, UA: NEGATIVE
Protein, UA: POSITIVE — AB
Spec Grav, UA: 1.02 (ref 1.010–1.025)
Urobilinogen, UA: 1 E.U./dL
pH, UA: 5 (ref 5.0–8.0)

## 2022-04-17 NOTE — Progress Notes (Signed)
Subjective:    Patient ID: Jamie Clark, male    DOB: [birth year], 72 y.o.   MRN: [id number]  HPI  72 year old male who  has a past medical history of Allergy, Anxiety, Depression, Hepatitis C, chronic ([place]), HIV positive ([place]), and Hypertension.  He presents to the office today for an acute issue. He had abdominal pain and diarrhea x 2 days. The day prior he was constipated and stopped at a grocery store  and bought a stool softner. When he got home he took two stool softners and and 2 ex lax tabs. For the following two days he has had diarrhea and abdominal pain. Today he feels better   Furthermore he reports that over the last month or so that he has been having frequent urination with urgency and incontinence and dark urine.  He denies dysuria, or noticing blood in his urine.  He does not have any muscle aches.  Is not drinking a lot of water throughout the day.  Review of Systems See HPI   Past Medical History:  Diagnosis Date   Allergy    Anxiety    Depression    Hepatitis C, chronic (HCC)    HIV positive ([place])    Hypertension     Social History   Socioeconomic History   Marital status: Single    Spouse name: Not on file   Number of children: Not on file   Years of education: Not on file   Highest education level: 12th grade  Occupational History   Not on file  Tobacco Use   Smoking status: Light Smoker    Packs/day: 0.15    Types: Cigarettes   Smokeless tobacco: Never   Tobacco comments:    3 per day, working on quitting  Vaping Use   Vaping Use: Never used  Substance and Sexual Activity   Alcohol use: No    Alcohol/week: 0.0 standard drinks of alcohol   Drug use: No   Sexual activity: Yes    Partners: Female  Other Topics Concern   Not on file  Social History Narrative   Not on file   Social Determinants of Health   Financial Resource Strain: Medium Risk (11/11/2021)   Overall Financial Resource Strain (CARDIA)    Difficulty of Paying Living  Expenses: Somewhat hard  Food Insecurity: No Food Insecurity (01/09/2022)   Hunger Vital Sign    Worried About Running Out of Food in the Last Year: Never true    Ran Out of Food in the Last Year: Never true  Recent Concern: Food Insecurity - Food Insecurity Present (11/11/2021)   Hunger Vital Sign    Worried About Running Out of Food in the Last Year: Sometimes true    Ran Out of Food in the Last Year: Often true  Transportation Needs: No Transportation Needs (01/09/2022)   PRAPARE - Hydrologist (Medical): No    Lack of Transportation (Non-Medical): No  Physical Activity: Insufficiently Active (11/11/2021)   Exercise Vital Sign    Days of Exercise per Week: 3 days    Minutes of Exercise per Session: 30 min  Stress: No Stress Concern Present (11/11/2021)   [place]    Feeling of Stress : Only a little  Social Connections: Moderately Integrated (11/11/2021)   Social Connection and Isolation Panel [NHANES]    Frequency of Communication with Friends and Family: Twice a week    Frequency of  Social Gatherings with Friends and Family: Twice a week    Attends Religious Services: More than 4 times per year    Active Member of [employer] or Organizations: Yes    Attends Archivist Meetings: 1 to 4 times per year    Marital Status: Divorced  Intimate Partner Violence: Not At Risk (03/26/2020)   Humiliation, Afraid, Rape, and Kick questionnaire    Fear of Current or Ex-Partner: No    Emotionally Abused: No    Physically Abused: No    Sexually Abused: No    Past Surgical History:  Procedure Laterality Date   CORONARY STENT INTERVENTION N/A 01/19/2022   Procedure: CORONARY STENT INTERVENTION;  Surgeon: Nelva Bush, MD;  Location: [facility];  Service: Cardiovascular;  Laterality: N/A;   LEFT HEART CATH AND CORONARY ANGIOGRAPHY N/A 01/19/2022   Procedure: LEFT HEART CATH AND  CORONARY ANGIOGRAPHY;  Surgeon: Nelva Bush, MD;  Location: [facility];  Service: Cardiovascular;  Laterality: N/A;   NO PAST SURGERIES      Family History  Problem Relation Age of Onset   Cancer Mother    Hypertension Mother    Esophageal cancer Mother    Cancer Father    Hypertension Father    Colon cancer Neg Hx    Colon polyps Neg Hx    Rectal cancer Neg Hx    Stomach cancer Neg Hx     No Known Allergies  Current Outpatient Medications on File Prior to Visit  Medication Sig Dispense Refill   abacavir-dolutegravir-lamiVUDine (TRIUMEQ) 600-50-300 MG tablet Take 1 tablet by mouth daily. 30 tablet 11   amLODipine (NORVASC) 10 MG tablet Take 1 tablet (10 mg total) by mouth daily. 90 tablet 3   aspirin EC 81 MG tablet Take 1 tablet (81 mg total) by mouth daily. Swallow whole. 30 tablet 12   atorvastatin (LIPITOR) 80 MG tablet Take 1 tablet (80 mg total) by mouth daily. 90 tablet 3   benzonatate (TESSALON PERLES) 100 MG capsule Take 1 capsule (100 mg total) by mouth 3 (three) times daily as needed for cough. 20 capsule 0   carvedilol (COREG) 6.25 MG tablet Take 1 tablet (6.25 mg total) by mouth 2 (two) times daily with a meal. 60 tablet 3   finasteride (PROSCAR) 5 MG tablet Take 1 tablet (5 mg total) by mouth daily. 90 tablet 3   irbesartan (AVAPRO) 300 MG tablet Take 1 tablet (300 mg total) by mouth daily. 30 tablet 3   magic mouthwash (lidocaine, diphenhydrAMINE, alum & mag hydroxide) suspension Swish and spit 5 mLs 3 (three) times daily as needed for mouth pain. 120 mL 0   nitroGLYCERIN (NITROSTAT) 0.4 MG SL tablet Place 1 tablet (0.4 mg total) under the tongue every 5 (five) minutes x 3 doses as needed for chest pain. 30 tablet 1   prasugrel (EFFIENT) 10 MG TABS tablet Take 1 tablet (10 mg total) by mouth daily. 30 tablet 3   tamsulosin (FLOMAX) 0.4 MG CAPS capsule Take 0.8 mg by mouth at bedtime.     triamcinolone (KENALOG) 0.1 % paste Apply one application to the  affected ulcers twice daily until resolved, not to exceed 10 days 5 g 0   traZODone (DESYREL) 50 MG tablet Take 0.5 tablets (25 mg total) by mouth at bedtime. 45 tablet 1   No current facility-administered medications on file prior to visit.    BP 126/60   Pulse 95   Temp 97.6 F (36.4 C) (Oral)  Ht 5\' 7"  (1.702 m)   Wt 184 lb (83.5 kg)   SpO2 94%   BMI 28.82 kg/m       Objective:   Physical Exam Vitals and nursing note reviewed.  Constitutional:      Appearance: He is well-developed.  Cardiovascular:     Rate and Rhythm: Normal rate and regular rhythm.     Pulses: Normal pulses.     Heart sounds: Normal heart sounds.  Pulmonary:     Effort: Pulmonary effort is normal.     Breath sounds: Normal breath sounds.  Abdominal:     General: Abdomen is flat. Bowel sounds are normal.     Palpations: Abdomen is soft. There is no mass.     Tenderness: There is no abdominal tenderness. There is no right CVA tenderness or left CVA tenderness.     Hernia: No hernia is present.  Musculoskeletal:        General: Normal range of motion.  Skin:    General: Skin is warm and dry.  Neurological:     General: No focal deficit present.     Mental Status: He is alert and oriented to person, place, and time.  Psychiatric:        Mood and Affect: Mood normal.        Behavior: Behavior normal.        Thought Content: Thought content normal.        Judgment: Judgment normal.        Assessment & Plan:  1. Urinary frequency  - POC Urinalysis Dipstick + leuks, protein, and urobilinogen -Urine was quite concentrated and darker in color.  Urinalysis did not show any blood.  He is on a statin medication but denies muscle pain.  Will have him drink more water over the weekend.  Send urine culture today and follow-up next week for labs since the lab is closed today. - Urine Culture; Future - Basic Metabolic Panel; Future - CK Total and CKMB; Future - Urine Culture  2. Generalized abdominal  pain -Seems to be related to overuse of stool softening medication and laxatives.  He was encouraged to start using Benefiber or Metamucil to help with his constipation.  Encouraged to drink more water throughout the day  3. Diarrhea, unspecified type - see above   Time spent with patient today was 50 minutes which consisted of chart review, discussing diagnosis, work up, treatment answering questions and documentation.

## 2022-04-17 NOTE — Patient Instructions (Signed)
Start using Benifiber or Metamucil to help with constipation   Y

## 2022-04-19 LAB — URINE CULTURE
MICRO NUMBER:: 14479070
Result:: NO GROWTH
SPECIMEN QUALITY:: ADEQUATE

## 2022-04-20 ENCOUNTER — Other Ambulatory Visit: Payer: 59

## 2022-04-20 NOTE — Telephone Encounter (Signed)
Pt never completed labs and advised that some has not been resulted. Pt has been scheduled.

## 2022-04-22 ENCOUNTER — Other Ambulatory Visit: Payer: 59

## 2022-04-22 NOTE — Telephone Encounter (Signed)
Spoke to the patient,passed out today while he was at work. His supervisor told the patient he was unconscious for 3-5 min. 911 was not called however, the patient his supervisor suggested he should eat something. The patient does not know if he hit his head when he passed out. Has not contacted his PCP pertaining to the incident. Denies any swelling of his arms and legs, shortness of breath, and chest discomfort. He currently does not monitor his blood pressure.Patient stated he is currently taking a blood thinner, advised him to go the nearest emergency room or call 911 for further evaluation. Will forward to MD and RN.

## 2022-04-29 ENCOUNTER — Telehealth: Payer: Self-pay

## 2022-04-29 NOTE — Telephone Encounter (Signed)
Contacted patient on preferred number listed in notes x 3 for scheduled AWV. Patient stated unable to complete AWV and will call back to rescheduled after 7P.

## 2022-05-04 ENCOUNTER — Telehealth: Payer: Self-pay | Admitting: Adult Health

## 2022-05-04 NOTE — Telephone Encounter (Signed)
Left message for patient to call back and schedule Medicare Annual Wellness Visit (AWV) either virtually or in office. Left  my Herbie Drape number [phone]   Last AWV 03/26/20 please schedule with Nurse Health Adviser   45 min for awv-i  in office appointments 30 min for awv-s & awv-i phone/virtual appointments

## 2022-05-06 ENCOUNTER — Ambulatory Visit (HOSPITAL_COMMUNITY)
Admission: EM | Admit: 2022-05-06 | Discharge: 2022-05-06 | Disposition: A | Discharge status: Home / Self Care | Payer: 59 | Attending: Family Medicine | Admitting: Family Medicine

## 2022-05-06 ENCOUNTER — Ambulatory Visit (INDEPENDENT_AMBULATORY_CARE_PROVIDER_SITE_OTHER): Payer: 59

## 2022-05-06 DIAGNOSIS — R109 Unspecified abdominal pain: Secondary | ICD-10-CM

## 2022-05-06 DIAGNOSIS — K5900 Constipation, unspecified: Secondary | ICD-10-CM

## 2022-05-06 NOTE — ED Triage Notes (Signed)
Pt c/o abd pain, constipation, nausea, vomiting  Denies heartburn  Onset ~ Monday   Says has hx of constipation but does not like to take laxatives b/c it gives him diarrhea.

## 2022-05-06 NOTE — Discharge Instructions (Addendum)
You may try over the counter MIRALAX as directed on the bottle.

## 2022-05-07 NOTE — ED Provider Notes (Signed)
Jamie Clark   [id number] 05/06/22 Arrival Time: [id number]  ASSESSMENT & PLAN:  1. Abdominal discomfort   2. Constipation, unspecified constipation type    I have personally viewed the imaging studies ordered this visit. No SBO appreciated. Much stool.  No indications for urgent advanced imaging abdominal/pelvic imaging at this time. Discussed. Trial of Miralax and hydration. Pain likely secondary to constipation.     Discharge Instructions      You may try over the counter MIRALAX as directed on the bottle.    Follow-up Information     [facility] at [facility].   Specialty: Emergency Medicine Why: If symptoms worsen in any way, especially worsening abdominal pain associated with nausea and vomiting. Contact information: [address] [id number] [place] [phone]               Reviewed expectations re: course of current medical issues. Questions answered. Outlined signs and symptoms indicating need for more acute intervention. Patient verbalized understanding. After Visit Summary given.   SUBJECTIVE: History from: patient. Jamie Clark is a 72 y.o. male who presents with complaint of constipation; x sev days; mild nausea yesterday without emesis; no nausea today; tolerating PO intake. Afebrile. ExLax without much relief. Small BM in waiting room today. Normal bladder habits. Is passing normal flatus.   Past Surgical History:  Procedure Laterality Date   CORONARY STENT INTERVENTION N/A 01/19/2022   Procedure: CORONARY STENT INTERVENTION;  Surgeon: Nelva Bush, MD;  Location: [facility];  Service: Cardiovascular;  Laterality: N/A;   LEFT HEART CATH AND CORONARY ANGIOGRAPHY N/A 01/19/2022   Procedure: LEFT HEART CATH AND CORONARY ANGIOGRAPHY;  Surgeon: Nelva Bush, MD;  Location: [facility];  Service: Cardiovascular;  Laterality: N/A;   NO PAST SURGERIES        OBJECTIVE:  Vitals:   05/06/22 1441  BP: 128/83  Pulse: 88  Resp: 20  Temp: (!) 97.5 F (36.4 C)  TempSrc: Oral  SpO2: 92%    General appearance: alert, oriented, no acute distress HEENT: [place]; AT; oropharynx moist Lungs: unlabored respirations Abdomen: soft; without distention; mild epigastric discomfort to palpation; normal bowel sounds; without masses or organomegaly; without guarding or rebound tenderness Back: without reported CVA tenderness; FROM at waist Extremities: without LE edema; symmetrical; without gross deformities Skin: warm and dry Neurologic: normal gait Psychological: alert and cooperative; normal mood and affect  Imaging: DG Abd 2 Views  Result Date: 05/06/2022 CLINICAL DATA:  Abdominal pain EXAM: ABDOMEN - 2 VIEW COMPARISON:  08/07/2019 FINDINGS: Visualized lung bases clear. No free air. Stomach and small bowel appear relatively decompressed. Moderate colonic fecal material without dilatation. No abnormal abdominal calcifications. Regional bones unremarkable. IMPRESSION: Nonobstructive bowel gas pattern. Electronically Signed   By: Lucrezia Europe M.D.   On: 05/06/2022 15:19     No Known Allergies                                             Past Medical History:  Diagnosis Date   Allergy    Anxiety    Depression    Hepatitis C, chronic (HCC)    HIV positive ([place])    Hypertension     Social History   Socioeconomic History   Marital status: Single    Spouse name: Not on file   Number of children:  Not on file   Years of education: Not on file   Highest education level: 12th grade  Occupational History   Not on file  Tobacco Use   Smoking status: Light Smoker    Packs/day: 0.15    Types: Cigarettes   Smokeless tobacco: Never   Tobacco comments:    3 per day, working on quitting  Vaping Use   Vaping Use: Never used  Substance and Sexual Activity   Alcohol use: No    Alcohol/week: 0.0 standard drinks of alcohol   Drug use: No   Sexual  activity: Yes    Partners: Female  Other Topics Concern   Not on file  Social History Narrative   Not on file   Social Determinants of Health   Financial Resource Strain: Medium Risk (11/11/2021)   Overall Financial Resource Strain (CARDIA)    Difficulty of Paying Living Expenses: Somewhat hard  Food Insecurity: No Food Insecurity (01/09/2022)   Hunger Vital Sign    Worried About Running Out of Food in the Last Year: Never true    Ran Out of Food in the Last Year: Never true  Recent Concern: Food Insecurity - Food Insecurity Present (11/11/2021)   Hunger Vital Sign    Worried About Running Out of Food in the Last Year: Sometimes true    Ran Out of Food in the Last Year: Often true  Transportation Needs: No Transportation Needs (01/09/2022)   PRAPARE - Hydrologist (Medical): No    Lack of Transportation (Non-Medical): No  Physical Activity: Insufficiently Active (11/11/2021)   Exercise Vital Sign    Days of Exercise per Week: 3 days    Minutes of Exercise per Session: 30 min  Stress: No Stress Concern Present (11/11/2021)   [place]    Feeling of Stress : Only a little  Social Connections: Moderately Integrated (11/11/2021)   Social Connection and Isolation Panel [NHANES]    Frequency of Communication with Friends and Family: Twice a week    Frequency of Social Gatherings with Friends and Family: Twice a week    Attends Religious Services: More than 4 times per year    Active Member of [employer] or Organizations: Yes    Attends Archivist Meetings: 1 to 4 times per year    Marital Status: Divorced  Intimate Partner Violence: Not At Risk (03/26/2020)   Humiliation, Afraid, Rape, and Kick questionnaire    Fear of Current or Ex-Partner: No    Emotionally Abused: No    Physically Abused: No    Sexually Abused: No    Family History  Problem Relation Age of Onset   Cancer Mother     Hypertension Mother    Esophageal cancer Mother    Cancer Father    Hypertension Father    Colon cancer Neg Hx    Colon polyps Neg Hx    Rectal cancer Neg Hx    Stomach cancer Neg Hx      Vanessa Kick, MD 05/07/22 [phone]

## 2022-05-13 ENCOUNTER — Telehealth: Payer: Self-pay | Admitting: Adult Health

## 2022-05-13 NOTE — Telephone Encounter (Signed)
Called patient to schedule Medicare Annual Wellness Visit (AWV). Left message for patient to call back and schedule Medicare Annual Wellness Visit (AWV).  Last date of AWV: 03/26/20  Please schedule an appointment at any time with [facility] or Colin Benton.  If any questions, please contact me at [phone].  Thank you ,  Barkley Boards AWV direct phone # [phone]

## 2022-05-15 ENCOUNTER — Other Ambulatory Visit: Payer: 59

## 2022-05-19 NOTE — Progress Notes (Unsigned)
Office Visit    Patient Name: Jamie Clark Date of Encounter: 05/20/2022  PCP:  Dorothyann Peng, NP   [facility]  Cardiologist:  Fransico Him, MD  Advanced Practice Provider:  No care team member to display Electrophysiologist:  None   HPI    Taydin Duncan is a 72 y.o. male with a history of anxiety, depression, hepatitis C, HIV positive, hypertension presents today for follow-up appointment.  He presented to the ED January 17, 2022 with chief complaint of chest pain and shortness of breath and found to have elevated troponin, 721>>> 1558.  Chest pain was associated with nausea, diaphoresis, vomiting as well as shortness of breath.  Chest pain is described as sharp, midsternal without radiation, had been having DOE for weeks.  Smokes about 4 cigarettes daily, denied any illicit drug use.  Chest x-ray revealed bibasilar atelectasis, otherwise nothing acute.  EKG revealed lateral ischemia, also had hypertensive urgency so it was felt elevated troponins could be due to ACS or demand ischemia, he had recently ran out of his medications.  Underwent left cardiac catheterization revealing severe single-vessel CAD with 90% proximal LAD stenosis.  There was mild, nonobstructive disease involving the distal LMCA and mid LAD.  Left circumflex and RCA were tortuous and mild luminal irregularities, normal EF 55 to 65% with upper normal filling pressure, LVEDP 15 mmHg.  Successful PCI to proximal LAD with drug-eluting stent with 0% residual stenosis.  DAPT with aspirin and Effient for at least 12 months.  He was seen in follow-up 01/28/2022 and at that time still having similar chest pain but not as often.  Described it as dull sternal chest pain with 4 out of 10 intensity not always associated with exertion but admitted to some shortness of breath with exertion.  Had noticed fatigue since leaving the hospital (STOP-BANG score of 5), notes poor appetite and having some nausea  in the a.m.  Had an episode of needing to take nitro and passed out, believes his blood sugar was low.  Experience weakness and blurry vision prior to syncopal episode.  Says he was down for about 10 minutes.  Has not had any recurrent symptoms.  Quit smoking cigarettes.  He was recently seen in urgent care 05/06/2022 for abdominal pain and constipation x 7 days.  No nausea.  Normal flatus.  Today, he feels okay from a heart standpoint.  He was recently in the urgent care for abdominal pain due to constipation.  We discussed MiraLAX use every other day as well as proper hydration.  He is asked for nitroglycerin prescription today which we will provide.  No further chest pains since his stent placement.  LDL is above goal so he will get a lipid panel today.  We have discussed starting Zetia as well.  He at one point was given instructions on how to do an at home sleep study and never got a message from Petronila regarding his pin.  I will send her message today to reorient the patient.  Otherwise, doing well without any shortness of breath.  He works at [employer] and has an active job.  Reports no shortness of breath nor dyspnea on exertion. Reports no chest pain, pressure, or tightness. No edema, orthopnea, PND. Reports no palpitations.   Past Medical History    Past Medical History:  Diagnosis Date   Allergy    Anxiety    Depression    Hepatitis C, chronic (HCC)    HIV positive ([place])  Hypertension    Past Surgical History:  Procedure Laterality Date   CORONARY STENT INTERVENTION N/A 01/19/2022   Procedure: CORONARY STENT INTERVENTION;  Surgeon: Nelva Bush, MD;  Location: [facility];  Service: Cardiovascular;  Laterality: N/A;   LEFT HEART CATH AND CORONARY ANGIOGRAPHY N/A 01/19/2022   Procedure: LEFT HEART CATH AND CORONARY ANGIOGRAPHY;  Surgeon: Nelva Bush, MD;  Location: [facility];  Service: Cardiovascular;  Laterality: N/A;   NO PAST SURGERIES      Allergies  No  Known Allergies  EKGs/Labs/Other Studies Reviewed:   The following studies were reviewed today:  Cardiac cath 01/19/22  Left Main  Vessel is large.  Dist LM lesion is 15% stenosed.    Left Anterior Descending  Vessel is large.  Prox LAD lesion is 90% stenosed. Vessel is the culprit lesion. The lesion is thrombotic.  Mid LAD lesion is 20% stenosed.    First Diagonal Branch  Vessel is small in size. Vessel is angiographically normal.    Second Diagonal Branch  Vessel is moderate in size.    Third Diagonal Branch  Vessel is moderate in size.    Left Circumflex  Vessel is large. The vessel exhibits minimal luminal irregularities. The vessel is moderately tortuous.    First Obtuse Marginal Branch  Vessel is large in size. There is mild disease in the vessel.    Second Obtuse Marginal Branch  Vessel is moderate in size.    Third Obtuse Marginal Branch  Vessel is small in size.    Right Coronary Artery  Vessel is moderate in size. Vessel is angiographically normal. The vessel is severely tortuous.    Right Posterior Descending Artery  Vessel is moderate in size.    Right Posterior Atrioventricular Artery  Vessel is large in size.    First Right Posterolateral Branch  Vessel is moderate in size.    Second Right Posterolateral Branch  Vessel is small in size.    Intervention   Prox LAD lesion  Stent  CATH VISTA GUIDE 6FR XBLAD4 guide catheter was inserted. Lesion crossed with guidewire using a WIRE HI TORQ BMW 190CM. Pre-stent angioplasty was performed using a BALLN SAPPHIRE 2.5X12. Maximum pressure: 12 atm. A drug-eluting stent was successfully placed using a SYNERGY XD 3.0X16. Maximum pressure: 14 atm. Stent strut is well apposed. Post-stent angioplasty was performed using a BALL SAPPHIRE NC24 3.25X12. Maximum pressure: 16 atm.  Post-Intervention Lesion Assessment  The intervention was successful. Pre-interventional TIMI flow is 3. Post-intervention TIMI flow is 3.  No complications occurred at this lesion.  There is a 0% residual stenosis post intervention.     Wall Motion              Left Heart  Left Ventricle The left ventricular size is normal. The left ventricular systolic function is normal. LV end diastolic pressure is normal. LVEDP 15 mmHg. The left ventricular ejection fraction is 55-65% by visual estimate. No regional wall motion abnormalities.  Aortic Valve There is no aortic valve stenosis.   Coronary Diagrams  Diagnostic Dominance: Right  Intervention      EKG:  EKG is not ordered today.    Recent Labs: 01/18/2022: Magnesium 1.9; TSH 0.833 01/28/2022: Hemoglobin 13.5; Platelets 368 03/31/2022: ALT 31; BUN 18; Creatinine, Ser 1.16; Potassium 4.0; Sodium 141  Recent Lipid Panel    Component Value Date/Time   CHOL 212 (H) 03/31/2022 1032   CHOL 170 03/30/2013 0000   TRIG 115 03/31/2022 1032   TRIG  148 03/30/2013 0000   HDL 35 (L) 03/31/2022 1032   CHOLHDL 6.1 (H) 03/31/2022 1032   CHOLHDL 7.1 01/18/2022 0529   VLDL 22 01/18/2022 0529   LDLCALC 156 (H) 03/31/2022 1032   LDLCALC 133 (H) 12/19/2020 1030   LDLCALC 93 10/12/2012 0000    Home Medications   Current Meds  Medication Sig   abacavir-dolutegravir-lamiVUDine (TRIUMEQ) 600-50-300 MG tablet Take 1 tablet by mouth daily.   aspirin EC 81 MG tablet Take 1 tablet (81 mg total) by mouth daily. Swallow whole.   atorvastatin (LIPITOR) 80 MG tablet Take 1 tablet (80 mg total) by mouth daily.   benzonatate (TESSALON PERLES) 100 MG capsule Take 1 capsule (100 mg total) by mouth 3 (three) times daily as needed for cough.   carvedilol (COREG) 6.25 MG tablet Take 1 tablet (6.25 mg total) by mouth 2 (two) times daily with a meal.   finasteride (PROSCAR) 5 MG tablet Take 1 tablet (5 mg total) by mouth daily.   irbesartan (AVAPRO) 300 MG tablet Take 1 tablet (300 mg total) by mouth daily.   magic mouthwash (lidocaine, diphenhydrAMINE, alum & mag hydroxide) suspension Swish  and spit 5 mLs 3 (three) times daily as needed for mouth pain.   Olmesartan-amLODIPine-HCTZ 40-10-25 MG TABS Take 1 tablet by mouth daily.   prasugrel (EFFIENT) 10 MG TABS tablet Take 1 tablet (10 mg total) by mouth daily.   tamsulosin (FLOMAX) 0.4 MG CAPS capsule Take 0.8 mg by mouth at bedtime.   triamcinolone (KENALOG) 0.1 % paste Apply one application to the affected ulcers twice daily until resolved, not to exceed 10 days   [DISCONTINUED] amLODipine (NORVASC) 10 MG tablet Take 1 tablet (10 mg total) by mouth daily.   [DISCONTINUED] nitroGLYCERIN (NITROSTAT) 0.4 MG SL tablet Place 1 tablet (0.4 mg total) under the tongue every 5 (five) minutes x 3 doses as needed for chest pain.   ezetimibe (ZETIA) 10 MG tablet Take 1 tablet (10 mg total) by mouth daily.     Review of Systems      All other systems reviewed and are otherwise negative except as noted above.  Physical Exam    VS:  BP (!) 142/98   Pulse 74   Ht '5\' 7"'$  (1.702 m)   Wt 186 lb (84.4 kg)   SpO2 96%   BMI 29.13 kg/m  , BMI Body mass index is 29.13 kg/m.  Wt Readings from Last 3 Encounters:  05/20/22 186 lb (84.4 kg)  04/17/22 184 lb (83.5 kg)  02/17/22 175 lb 3.2 oz (79.5 kg)     GEN: Well nourished, well developed, in no acute distress. HEENT: normal. Neck: Supple, no JVD, carotid bruits, or masses. Cardiac: RRR, no murmurs, rubs, or gallops. No clubbing, cyanosis, edema.  Radials/PT 2+ and equal bilaterally.  Respiratory:  Respirations regular and unlabored, clear to auscultation bilaterally. GI: Soft, nontender, nondistended. MS: No deformity or atrophy. Skin: Warm and dry, no rash. Neuro:  Strength and sensation are intact. Psych: Normal affect.  Assessment & Plan    CAD status post NSTEMI and DES to pLAD -no further chest pain -doing well today -lipid panel and LFTs today -add zetia '10mg'$  daily -Continue current medications: Aspirin 81 mg daily, Lipitor 80 mg daily, Coreg 6.25 mg twice daily, nitro as  needed, Effient 10 mg daily, olmesartan/amlodipine/HCTZ 40 -10-25 mg daily  Hypertension -slightly elevated today -Continue to monitor at home, recent medication change with combo pill  Hyperlipidemia -LDL was checked back in October  and was 156, HDL 35, triglycerides 115 -Plan to check a lipid panel today -Add Zetia 10 mg daily and recheck lipid panel in 2 months  Prediabetes -Hemoglobin A1c 5.7  OSA? -He had a conversation with Arbie Cookey back in the fall and never received a voicemail from her regarding the pin to do the study -We will reconnect them to try and further evaluate -Stop bang of 5    Disposition: Follow up 6 months with Fransico Him, MD or APP.  Signed, Elgie Collard, PA-C 05/20/2022, 9:33 AM [place]

## 2022-05-20 ENCOUNTER — Ambulatory Visit: Payer: 59 | Attending: Cardiology | Admitting: Physician Assistant

## 2022-05-20 ENCOUNTER — Encounter: Payer: Self-pay | Admitting: Physician Assistant

## 2022-05-20 ENCOUNTER — Ambulatory Visit: Payer: 59

## 2022-05-20 VITALS — BP 142/98 | HR 74 | Ht 67.0 in | Wt 186.0 lb

## 2022-05-20 DIAGNOSIS — Z79899 Other long term (current) drug therapy: Secondary | ICD-10-CM

## 2022-05-20 DIAGNOSIS — I2510 Atherosclerotic heart disease of native coronary artery without angina pectoris: Secondary | ICD-10-CM

## 2022-05-20 DIAGNOSIS — E785 Hyperlipidemia, unspecified: Secondary | ICD-10-CM

## 2022-05-20 DIAGNOSIS — I10 Essential (primary) hypertension: Secondary | ICD-10-CM

## 2022-05-20 MED ORDER — NITROGLYCERIN 0.4 MG SL SUBL: 0.4000 mg | SUBLINGUAL_TABLET | SUBLINGUAL | 3 refills | Status: AC | PRN

## 2022-05-20 MED ORDER — EZETIMIBE 10 MG PO TABS: 10.0000 mg | ORAL_TABLET | Freq: Every day | ORAL | 3 refills | Status: AC

## 2022-05-20 NOTE — Patient Instructions (Signed)
Medication Instructions:  1.Stop irbesartan (Avapro) 2.Start ezetimibe (Zetia) 10 mg daily 3.Continue olmesartan-amlodipine-hctz *If you need a refill on your cardiac medications before your next appointment, please call your pharmacy*   Lab Work: Lipid and lft's today as scheduled Fasting lipid and lft's in 2 months If you have labs (blood work) drawn today and your tests are completely normal, you will receive your results only by: [place] (if you have MyChart) OR A paper copy in the mail If you have any lab test that is abnormal or we need to change your treatment, we will call you to review the results.   Follow-Up: At [facility], you and your health needs are our priority.  As part of our continuing mission to provide you with exceptional heart care, we have created designated Provider Care Teams.  These Care Teams include your primary Cardiologist (physician) and Advanced Practice Providers (APPs -  Physician Assistants and Nurse Practitioners) who all work together to provide you with the care you need, when you need it.   Your next appointment:   6 month(s)  Provider:   Fransico Him, MD    Other Instructions Check your blood pressure and heart rate daily, one hour after taking your morning medications for 2 weeks, keep a log and send [place] the readings through mychart at the end of the 2 weeks.  Low-Sodium Eating Plan Sodium, which is an element that makes up salt, helps you maintain a healthy balance of fluids in your body. Too much sodium can increase your blood pressure and cause fluid and waste to be held in your body. Your health care provider or dietitian may recommend following this plan if you have high blood pressure (hypertension), kidney disease, liver disease, or heart failure. Eating less sodium can help lower your blood pressure, reduce swelling, and protect your heart, liver, and kidneys. What are tips for following this plan? Reading food  labels The Nutrition Facts label lists the amount of sodium in one serving of the food. If you eat more than one serving, you must multiply the listed amount of sodium by the number of servings. Choose foods with less than 140 mg of sodium per serving. Avoid foods with 300 mg of sodium or more per serving. Shopping  Look for lower-sodium products, often labeled as "low-sodium" or "no salt added." Always check the sodium content, even if foods are labeled as "unsalted" or "no salt added." Buy fresh foods. Avoid canned foods and pre-made or frozen meals. Avoid canned, cured, or processed meats. Buy breads that have less than 80 mg of sodium per slice. Cooking  Eat more home-cooked food and less restaurant, buffet, and fast food. Avoid adding salt when cooking. Use salt-free seasonings or herbs instead of table salt or sea salt. Check with your health care provider or pharmacist before using salt substitutes. Cook with plant-based oils, such as canola, sunflower, or olive oil. Meal planning When eating at a restaurant, ask that your food be prepared with less salt or no salt, if possible. Avoid dishes labeled as brined, pickled, cured, smoked, or made with soy sauce, miso, or teriyaki sauce. Avoid foods that contain MSG (monosodium glutamate). MSG is sometimes added to [place] food, bouillon, and some canned foods. Make meals that can be grilled, baked, poached, roasted, or steamed. These are generally made with less sodium. General information Most people on this plan should limit their sodium intake to 1,500-2,000 mg (milligrams) of sodium each day. What foods should I  eat? Fruits Fresh, frozen, or canned fruit. Fruit juice. Vegetables Fresh or frozen vegetables. "No salt added" canned vegetables. "No salt added" tomato sauce and paste. Low-sodium or reduced-sodium tomato and vegetable juice. Grains Low-sodium cereals, including oats, puffed wheat and rice, and shredded wheat. Low-sodium  crackers. Unsalted rice. Unsalted pasta. Low-sodium bread. Whole-grain breads and whole-grain pasta. Meats and other proteins Fresh or frozen (no salt added) meat, poultry, seafood, and fish. Low-sodium canned tuna and salmon. Unsalted nuts. Dried peas, beans, and lentils without added salt. Unsalted canned beans. Eggs. Unsalted nut butters. Dairy Milk. Soy milk. Cheese that is naturally low in sodium, such as ricotta cheese, fresh mozzarella, or Swiss cheese. Low-sodium or reduced-sodium cheese. Cream cheese. Yogurt. Seasonings and condiments Fresh and dried herbs and spices. Salt-free seasonings. Low-sodium mustard and ketchup. Sodium-free salad dressing. Sodium-free light mayonnaise. Fresh or refrigerated horseradish. Lemon juice. Vinegar. Other foods Homemade, reduced-sodium, or low-sodium soups. Unsalted popcorn and pretzels. Low-salt or salt-free chips. The items listed above may not be a complete list of foods and beverages you can eat. Contact a dietitian for more information. What foods should I avoid? Vegetables Sauerkraut, pickled vegetables, and relishes. Olives. [place] fries. Onion rings. Regular canned vegetables (not low-sodium or reduced-sodium). Regular canned tomato sauce and paste (not low-sodium or reduced-sodium). Regular tomato and vegetable juice (not low-sodium or reduced-sodium). Frozen vegetables in sauces. Grains Instant hot cereals. Bread stuffing, pancake, and biscuit mixes. Croutons. Seasoned rice or pasta mixes. Noodle soup cups. Boxed or frozen macaroni and cheese. Regular salted crackers. Self-rising flour. Meats and other proteins Meat or fish that is salted, canned, smoked, spiced, or pickled. Precooked or cured meat, such as sausages or meat loaves. [place] Salines. Ham. Pepperoni. Hot dogs. Corned beef. Chipped beef. Salt pork. Jerky. Pickled herring. Anchovies and sardines. Regular canned tuna. Salted nuts. Dairy Processed cheese and cheese spreads. Hard cheeses. Cheese  curds. Blue cheese. Feta cheese. String cheese. Regular cottage cheese. Buttermilk. Canned milk. Fats and oils Salted butter. Regular margarine. Ghee. Bacon fat. Seasonings and condiments Onion salt, garlic salt, seasoned salt, table salt, and sea salt. Canned and packaged gravies. Worcestershire sauce. Tartar sauce. Barbecue sauce. Teriyaki sauce. Soy sauce, including reduced-sodium. Steak sauce. Fish sauce. Oyster sauce. Cocktail sauce. Horseradish that you find on the shelf. Regular ketchup and mustard. Meat flavorings and tenderizers. Bouillon cubes. Hot sauce. Pre-made or packaged marinades. Pre-made or packaged taco seasonings. Relishes. Regular salad dressings. Salsa. Other foods Salted popcorn and pretzels. Corn chips and puffs. Potato and tortilla chips. Canned or dried soups. Pizza. Frozen entrees and pot pies. The items listed above may not be a complete list of foods and beverages you should avoid. Contact a dietitian for more information. Summary Eating less sodium can help lower your blood pressure, reduce swelling, and protect your heart, liver, and kidneys. Most people on this plan should limit their sodium intake to 1,500-2,000 mg (milligrams) of sodium each day. Canned, boxed, and frozen foods are high in sodium. Restaurant foods, fast foods, and pizza are also very high in sodium. You also get sodium by adding salt to food. Try to cook at home, eat more fresh fruits and vegetables, and eat less fast food and canned, processed, or prepared foods. This information is not intended to replace advice given to you by your health care provider. Make sure you discuss any questions you have with your health care provider. Document Revised: 02/13/2019 Document Reviewed: 02/08/2019 Elsevier Patient Education  [place].

## 2022-05-21 LAB — LIPID PANEL
Chol/HDL Ratio: 5.7 ratio — ABNORMAL HIGH (ref 0.0–5.0)
Cholesterol, Total: 178 mg/dL (ref 100–199)
HDL: 31 mg/dL — ABNORMAL LOW (ref >39)
LDL Chol Calc (NIH): 125 mg/dL — ABNORMAL HIGH (ref 0–99)
Triglycerides: 118 mg/dL (ref 0–149)
VLDL Cholesterol Cal: 22 mg/dL (ref 5–40)

## 2022-05-21 LAB — HEPATIC FUNCTION PANEL
ALT: 21 IU/L (ref 0–44)
AST: 20 IU/L (ref 0–40)
Albumin: 4.1 g/dL (ref 3.9–4.9)
Alkaline Phosphatase: 118 IU/L (ref 44–121)
Bilirubin Total: 0.5 mg/dL (ref 0.0–1.2)
Bilirubin, Direct: 0.13 mg/dL (ref 0.00–0.40)
Total Protein: 8.1 g/dL (ref 6.0–8.5)

## 2022-05-22 ENCOUNTER — Ambulatory Visit: Payer: Medicare Other | Admitting: Cardiology

## 2022-05-25 ENCOUNTER — Ambulatory Visit (INDEPENDENT_AMBULATORY_CARE_PROVIDER_SITE_OTHER): Payer: 59 | Admitting: Podiatry

## 2022-05-25 VITALS — BP 160/103

## 2022-05-25 DIAGNOSIS — M79675 Pain in left toe(s): Secondary | ICD-10-CM | POA: Diagnosis not present

## 2022-05-25 DIAGNOSIS — M79674 Pain in right toe(s): Secondary | ICD-10-CM

## 2022-05-25 DIAGNOSIS — B351 Tinea unguium: Secondary | ICD-10-CM

## 2022-05-25 NOTE — Progress Notes (Unsigned)
  Subjective:  Patient ID: Jamie Clark, male    DOB: 1950-04-06,  MRN: [id number]  Jamie Clark presents to clinic today for {jgcomplaint:23593}  Chief Complaint  Patient presents with   Nail Problem    RFC PCP-Can not remeber PCP VST - 1 month ago   New problem(s): None. {jgcomplaint:23593}  PCP is Dorothyann Peng, NP.  No Known Allergies  Review of Systems: Negative except as noted in the HPI.  Objective: No changes noted in today's physical examination. Vitals:   05/25/22 1337  BP: (!) 160/103   Jamie Clark is a pleasant 72 y.o. male {jgbodyhabitus:24098} AAO x 3.   Assessment/Plan: No diagnosis found.  No orders of the defined types were placed in this encounter.   None {Jgplan:23602::"-Patient/POA to call should there be question/concern in the interim."}   No follow-ups on file.  Marzetta Board, DPM

## 2022-05-27 ENCOUNTER — Encounter: Payer: Self-pay | Admitting: Podiatry

## 2022-05-28 ENCOUNTER — Other Ambulatory Visit: Payer: Self-pay | Admitting: Urology

## 2022-05-28 DIAGNOSIS — R972 Elevated prostate specific antigen [PSA]: Secondary | ICD-10-CM

## 2022-05-31 ENCOUNTER — Encounter: Payer: Self-pay | Admitting: Cardiology

## 2022-05-31 DIAGNOSIS — I10 Essential (primary) hypertension: Secondary | ICD-10-CM

## 2022-06-01 MED ORDER — CARVEDILOL 6.25 MG PO TABS: 6.2500 mg | ORAL_TABLET | Freq: Two times a day (BID) | ORAL | 3 refills | Status: DC

## 2022-06-02 MED ORDER — PRASUGREL HCL 10 MG PO TABS: 10.0000 mg | ORAL_TABLET | Freq: Every day | ORAL | 3 refills | Status: DC

## 2022-06-02 NOTE — Addendum Note (Signed)
Addended by: Vergia Alcon A on: 06/02/2022 08:49 AM   Modules accepted: Orders

## 2022-06-04 ENCOUNTER — Other Ambulatory Visit: Payer: Self-pay

## 2022-06-04 ENCOUNTER — Encounter: Payer: Self-pay | Admitting: Infectious Diseases

## 2022-06-04 ENCOUNTER — Ambulatory Visit (INDEPENDENT_AMBULATORY_CARE_PROVIDER_SITE_OTHER): Payer: 59 | Admitting: Infectious Diseases

## 2022-06-04 VITALS — BP 158/94 | HR 85 | Temp 97.8°F | Ht 68.0 in | Wt 134.0 lb

## 2022-06-04 DIAGNOSIS — I252 Old myocardial infarction: Secondary | ICD-10-CM | POA: Diagnosis not present

## 2022-06-04 DIAGNOSIS — B20 Human immunodeficiency virus [HIV] disease: Secondary | ICD-10-CM

## 2022-06-04 DIAGNOSIS — N400 Enlarged prostate without lower urinary tract symptoms: Secondary | ICD-10-CM | POA: Diagnosis not present

## 2022-06-04 MED ORDER — DOVATO 50-300 MG PO TABS: 1.0000 | ORAL_TABLET | Freq: Every day | ORAL | 3 refills | Status: DC

## 2022-06-04 NOTE — Assessment & Plan Note (Signed)
Has a prostate MRI coming up in a few weeks to evaluate urinary symptoms in the setting of elevated PSA.  Urology is following

## 2022-06-04 NOTE — Assessment & Plan Note (Addendum)
Geran and I spent a lot of time discussing how HIV is a risk factor for cardiovascular disease due to underlying inflammation.  He also has a strong family history and other risk factors present.  We discussed switching him off of Triumeq due to some studies linking cardiovascular events with abacavir use.  He was given time to ask questions which were answered to his satisfaction.  He understands he will pick up his new medication and stop taking the Triumeq Blood pressure still a bit elevated today but he just took his medications before coming to the visit.  I encouraged him to continue to follow closely with his cardiology team. Statin for secondary prevention ongoing.

## 2022-06-04 NOTE — Patient Instructions (Signed)
STOP your triumeq   START your new medication called DOVATO - take this once a day  Will see you back in 1 month for blood check to look at levels after you switch your medication.

## 2022-06-04 NOTE — Progress Notes (Signed)
Patient ID: Jamie Clark, male   DOB: Aug 04, 1950, 72 y.o.   MRN: [id number]   CC:  Chief Complaint  Patient presents with   Follow-up  Had a heart attack in 02/21/2023.     Previous Regimens:  Triumeq Dovato 05/2022   HPI Jamie Clark is here for 15-monthfollow-up.  He states a bit going on since we last saw him with his health. He had a NSTEMI that required a stent in NDec 01, 2023 Brother passed away from heart problems recently. New medication regimens now working well from what he describes.  Had one syncopal episode since his heart attack--has been keeping in close contact with his heart team over this.   He has a prostate MRI coming up in April to evaluate elevated PSA and r/o cancer.   Continues his Triumeq once daily with no missed doses.  He continues to work at [employer]    Review of Systems  Constitutional:  Negative for appetite change, chills, fatigue, fever and unexpected weight change.  Eyes:  Negative for visual disturbance.  Respiratory:  Negative for cough and shortness of breath.   Cardiovascular:  Negative for chest pain and leg swelling.  Gastrointestinal:  Negative for abdominal pain, diarrhea and nausea.  Genitourinary:  Negative for dysuria, genital sores and penile discharge.  Musculoskeletal:  Negative for joint swelling.  Skin:  Negative for color change and rash.  Neurological:  Negative for dizziness and headaches.  Hematological:  Negative for adenopathy.  Psychiatric/Behavioral:  Negative for sleep disturbance. The patient is not nervous/anxious.       Outpatient Encounter Medications as of 06/04/2022  Medication Sig   aspirin EC 81 MG tablet Take 1 tablet (81 mg total) by mouth daily. Swallow whole.   atorvastatin (LIPITOR) 80 MG tablet Take 1 tablet (80 mg total) by mouth daily.   benzonatate (TESSALON PERLES) 100 MG capsule Take 1 capsule (100 mg total) by mouth 3 (three) times daily as needed for cough.   carvedilol (COREG) 6.25 MG  tablet Take 1 tablet (6.25 mg total) by mouth 2 (two) times daily with a meal.   carvedilol (COREG) 6.25 MG tablet Take 1 tablet (6.25 mg total) by mouth 2 (two) times daily.   dolutegravir-lamiVUDine (DOVATO) 50-300 MG tablet Take 1 tablet by mouth daily.   ezetimibe (ZETIA) 10 MG tablet Take 1 tablet (10 mg total) by mouth daily.   finasteride (PROSCAR) 5 MG tablet Take 1 tablet (5 mg total) by mouth daily.   nitroGLYCERIN (NITROSTAT) 0.4 MG SL tablet Place 1 tablet (0.4 mg total) under the tongue every 5 (five) minutes x 3 doses as needed for chest pain.   Olmesartan-amLODIPine-HCTZ 40-10-25 MG TABS Take 1 tablet by mouth daily.   prasugrel (EFFIENT) 10 MG TABS tablet Take 1 tablet (10 mg total) by mouth daily.   tamsulosin (FLOMAX) 0.4 MG CAPS capsule Take 0.8 mg by mouth at bedtime.   [DISCONTINUED] abacavir-dolutegravir-lamiVUDine (TRIUMEQ) 600-50-300 MG tablet Take 1 tablet by mouth daily.   magic mouthwash (lidocaine, diphenhydrAMINE, alum & mag hydroxide) suspension Swish and spit 5 mLs 3 (three) times daily as needed for mouth pain. (Patient not taking: Reported on 06/04/2022)   traZODone (DESYREL) 50 MG tablet Take 0.5 tablets (25 mg total) by mouth at bedtime.   triamcinolone (KENALOG) 0.1 % paste Apply one application to the affected ulcers twice daily until resolved, not to exceed 10 days (Patient not taking: Reported on 06/04/2022)   No facility-administered encounter medications on file as of  06/04/2022.     Patient Active Problem List   Diagnosis Date Noted   Coronary artery disease involving native coronary artery of native heart without angina pectoris 02/17/2022   Daytime sleepiness 02/17/2022   Snoring 02/17/2022   Poor appetite 02/17/2022   Hx of NSTEMI 12/2021 s/p PCI to LAD 01/18/2022   Insomnia 04/24/2020   Grief at loss of child 07/04/2019   Anxiety 12/22/2018   Prostatic hypertrophy 12/22/2018   Prediabetes 08/22/2018   Screen for STD (sexually transmitted  disease) 08/22/2018   Hyperlipidemia LDL goal <70 01/17/2009   Essential hypertension 01/17/2009   HIV infection ([place]) 12/18/2008   Hepatitis C virus infection cured after antiviral drug therapy 07/13/2007     Health Maintenance Due  Topic Date Due   Zoster Vaccines- Shingrix (1 of 2) Never done   COVID-19 Vaccine (2 - Janssen risk series) 07/17/2019   Medicare Annual Wellness (AWV)  03/26/2021   INFLUENZA VACCINE  10/21/2021     Physical Exam  BP (!) 158/94   Pulse 85   Temp 97.8 F (36.6 C) (Temporal)   Ht '5\' 8"'$  (1.727 m)   Wt 134 lb (60.8 kg)   SpO2 96%   BMI 20.37 kg/m     Physical Exam Vitals reviewed.  Constitutional:      Appearance: Normal appearance. He is not ill-appearing.  HENT:     Head: Normocephalic.     Mouth/Throat:     Mouth: Mucous membranes are moist.     Pharynx: Oropharynx is clear.  Eyes:     General: No scleral icterus. Cardiovascular:     Rate and Rhythm: Normal rate and regular rhythm.  Pulmonary:     Effort: Pulmonary effort is normal.  Musculoskeletal:        General: Normal range of motion.     Cervical back: Normal range of motion.  Skin:    Coloration: Skin is not jaundiced or pale.  Neurological:     Mental Status: He is alert and oriented to person, place, and time.  Psychiatric:        Mood and Affect: Mood normal.        Judgment: Judgment normal.      Lab Results  Component Value Date   CD4TCELL 39 12/08/2021   Lab Results  Component Value Date   CD4TABS 763 12/08/2021   CD4TABS 635 12/19/2020   CD4TABS 905 03/05/2020   Lab Results  Component Value Date   HIV1RNAQUANT <20 (H) 12/08/2021   Lab Results  Component Value Date   HEPBSAB NEG 05/06/2010   Lab Results  Component Value Date   LABRPR NON-REACTIVE 05/30/2021    CBC Lab Results  Component Value Date   WBC 7.1 01/28/2022   RBC 3.53 (L) 01/28/2022   HGB 13.5 01/28/2022   HCT 37.1 (L) 01/28/2022   PLT 368 01/28/2022   MCV 105 (H) 01/28/2022    MCH 38.2 (H) 01/28/2022   MCHC 36.4 (H) 01/28/2022   RDW 14.6 01/28/2022   LYMPHSABS 1.8 08/21/2021   MONOABS 0.3 08/21/2021   EOSABS 0.2 08/21/2021    BMET Lab Results  Component Value Date   NA 141 03/31/2022   K 4.0 03/31/2022   CL 105 03/31/2022   CO2 22 03/31/2022   GLUCOSE 101 (H) 03/31/2022   BUN 18 03/31/2022   CREATININE 1.16 03/31/2022   CALCIUM 9.4 03/31/2022   GFRNONAA >60 01/20/2022   GFRAA 70 12/14/2019    Assessment and Plan Problem List Items Addressed  This Visit       Unprioritized   HIV infection ([place]) (Chronic)    Jamie Clark continues to have very well controlled HIV on once daily Triumeq up until recently.  Given potential association for cardiovascular events with abacavir use we discussed switching him to once daily Dovato.  I think he will do very well with this medication.  I reviewed some of his old treatment regimens from 2015 and do not suspect any concern with the switch.  He should not experience any side effects given he is already taking these medications and current regimen.  Will try to do 90-day supply per his preference.  I will have him come back in 1 month after starting his new medication to reassess viral load CD4 count.  Recently has had lipid, CMP, CBC screenings. Deferred vaccines today.  Not high risk for anorectal cancer screening based on our conversation today.  Would like to see him again 6 months after his lab check in April.  I scheduled both of these appointments for him and provided him a general work note to give to his employer per his request      Relevant Medications   dolutegravir-lamiVUDine (DOVATO) 50-300 MG tablet   Prostatic hypertrophy    Has a prostate MRI coming up in a few weeks to evaluate urinary symptoms in the setting of elevated PSA.  Urology is following      Hx of NSTEMI 12/2021 s/p PCI to LAD    Jamie Clark and I spent a lot of time discussing how HIV is a risk factor for cardiovascular disease due to  underlying inflammation.  He also has a strong family history and other risk factors present.  We discussed switching him off of Triumeq due to some studies linking cardiovascular events with abacavir use.  He was given time to ask questions which were answered to his satisfaction.  He understands he will pick up his new medication and stop taking the Triumeq Blood pressure still a bit elevated today but he just took his medications before coming to the visit.  I encouraged him to continue to follow closely with his cardiology team. Statin for secondary prevention ongoing.       Other Visit Diagnoses     Human immunodeficiency virus (HIV) disease ([employer])    -  Primary   Relevant Medications   dolutegravir-lamiVUDine (DOVATO) 50-300 MG tablet   Other Relevant Orders   HIV 1 RNA quant-no reflex-bld   T-helper cells (CD4) count       Jamie Madeira, MSN, NP-C [facility] [email or web address] Pager: [phone] Office: [phone] [facility] Main Line: [place] Communication Welcome

## 2022-06-04 NOTE — Assessment & Plan Note (Signed)
Jibriel continues to have very well controlled HIV on once daily Triumeq up until recently.  Given potential association for cardiovascular events with abacavir use we discussed switching him to once daily Dovato.  I think he will do very well with this medication.  I reviewed some of his old treatment regimens from 2015 and do not suspect any concern with the switch.  He should not experience any side effects given he is already taking these medications and current regimen.  Will try to do 90-day supply per his preference.  I will have him come back in 1 month after starting his new medication to reassess viral load CD4 count.  Recently has had lipid, CMP, CBC screenings. Deferred vaccines today.  Not high risk for anorectal cancer screening based on our conversation today.  Would like to see him again 6 months after his lab check in April.  I scheduled both of these appointments for him and provided him a general work note to give to his employer per his request

## 2022-06-28 ENCOUNTER — Ambulatory Visit
Admission: RE | Admit: 2022-06-28 | Discharge: 2022-06-28 | Disposition: A | Discharge status: Home / Self Care | Payer: 59 | Source: Ambulatory Visit | Attending: Urology | Admitting: Urology

## 2022-06-28 DIAGNOSIS — R972 Elevated prostate specific antigen [PSA]: Secondary | ICD-10-CM

## 2022-07-02 ENCOUNTER — Ambulatory Visit (INDEPENDENT_AMBULATORY_CARE_PROVIDER_SITE_OTHER): Payer: 59

## 2022-07-02 ENCOUNTER — Encounter (HOSPITAL_COMMUNITY): Payer: Self-pay | Admitting: *Deleted

## 2022-07-02 ENCOUNTER — Ambulatory Visit (HOSPITAL_COMMUNITY)
Admission: EM | Admit: 2022-07-02 | Discharge: 2022-07-02 | Disposition: A | Discharge status: Home / Self Care | Payer: 59 | Attending: Emergency Medicine | Admitting: Emergency Medicine

## 2022-07-02 DIAGNOSIS — S9782XA Crushing injury of left foot, initial encounter: Secondary | ICD-10-CM | POA: Diagnosis not present

## 2022-07-02 DIAGNOSIS — M79672 Pain in left foot: Secondary | ICD-10-CM | POA: Insufficient documentation

## 2022-07-02 DIAGNOSIS — W19XXXA Unspecified fall, initial encounter: Secondary | ICD-10-CM

## 2022-07-02 NOTE — ED Provider Notes (Signed)
[facility]    CSN: [id number] Arrival date & time: 07/02/22  1011      History   Chief Complaint Chief Complaint  Patient presents with   Foot Injury    HPI Jamie Clark is a 72 y.o. male.   72 year old male patient, Jamie Clark, presents to urgent care chief complaint of left foot pain after a brake rotor fell on his foot at work.  Patient is able to bear weight, pain with palpation.  Patient has not taken any meds for symptom management.  The history is provided by the patient. No language interpreter was used.    Past Medical History:  Diagnosis Date   Allergy    Anxiety    Depression    Hepatitis C, chronic    HIV positive    Hypertension     Patient Active Problem List   Diagnosis Date Noted   Crush injury of left foot, initial encounter 07/02/2022   Coronary artery disease involving native coronary artery of native heart without angina pectoris 02/17/2022   Daytime sleepiness 02/17/2022   Snoring 02/17/2022   Poor appetite 02/17/2022   Hx of NSTEMI 12/2021 s/p PCI to LAD 01/18/2022   Insomnia 04/24/2020   Grief at loss of child 07/04/2019   Anxiety 12/22/2018   Prostatic hypertrophy 12/22/2018   Prediabetes 08/22/2018   Screen for STD (sexually transmitted disease) 08/22/2018   Left foot pain 04/30/2014   Hyperlipidemia LDL goal <70 01/17/2009   Essential hypertension 01/17/2009   HIV infection 12/18/2008   Hepatitis C virus infection cured after antiviral drug therapy 07/13/2007    Past Surgical History:  Procedure Laterality Date   CORONARY STENT INTERVENTION N/A 01/19/2022   Procedure: CORONARY STENT INTERVENTION;  Surgeon: Yvonne KendallEnd, Christopher, MD;  [facility];  Service: Cardiovascular;  Laterality: N/A;   LEFT HEART CATH AND CORONARY ANGIOGRAPHY N/A 01/19/2022   Procedure: LEFT HEART CATH AND CORONARY ANGIOGRAPHY;  Surgeon: Yvonne KendallEnd, Christopher, MD;  Location: [facility];  Service: Cardiovascular;   Laterality: N/A;   NO PAST SURGERIES         Home Medications    Prior to Admission medications   Medication Sig Start Date End Date Taking? Authorizing Provider  aspirin EC 81 MG tablet Take 1 tablet (81 mg total) by mouth daily. Swallow whole. 01/21/22  Yes Perlie GoldWilliams, Evan, PA-C  atorvastatin (LIPITOR) 80 MG tablet Take 1 tablet (80 mg total) by mouth daily. 04/01/22  Yes Weaver, Scott T, PA-C  carvedilol (COREG) 6.25 MG tablet Take 1 tablet (6.25 mg total) by mouth 2 (two) times daily with a meal. 01/20/22  Yes Perlie GoldWilliams, Evan, PA-C  dolutegravir-lamiVUDine (DOVATO) 50-300 MG tablet Take 1 tablet by mouth daily. 06/04/22  Yes Blanchard Kelchixon, Stephanie N, NP  ezetimibe (ZETIA) 10 MG tablet Take 1 tablet (10 mg total) by mouth daily. 05/20/22  Yes Conte, Tessa N, PA-C  finasteride (PROSCAR) 5 MG tablet Take 1 tablet (5 mg total) by mouth daily. 08/26/21  Yes Nafziger, Kandee Keenory, NP  Olmesartan-amLODIPine-HCTZ 40-10-25 MG TABS Take 1 tablet by mouth daily. 02/16/22  Yes [provider]  prasugrel (EFFIENT) 10 MG TABS tablet Take 1 tablet (10 mg total) by mouth daily. 06/02/22  Yes Sharlene Doryonte, Tessa N, PA-C  tamsulosin (FLOMAX) 0.4 MG CAPS capsule Take 0.8 mg by mouth at bedtime. 12/05/20  Yes [provider]  benzonatate (TESSALON PERLES) 100 MG capsule Take 1 capsule (100 mg total) by mouth 3 (three) times daily as needed for  cough. 03/05/22   Crain, Whitney L, PA  carvedilol (COREG) 6.25 MG tablet Take 1 tablet (6.25 mg total) by mouth 2 (two) times daily. 06/01/22   Sharlene Dory, PA-C  magic mouthwash (lidocaine, diphenhydrAMINE, alum & mag hydroxide) suspension Swish and spit 5 mLs 3 (three) times daily as needed for mouth pain. Patient not taking: Reported on 06/04/2022 03/05/22   Guy Sandifer L, PA  nitroGLYCERIN (NITROSTAT) 0.4 MG SL tablet Place 1 tablet (0.4 mg total) under the tongue every 5 (five) minutes x 3 doses as needed for chest pain. 05/20/22   Sharlene Dory, PA-C  traZODone  (DESYREL) 50 MG tablet Take 0.5 tablets (25 mg total) by mouth at bedtime. 01/09/22 04/09/22  Nafziger, Kandee Keen, NP  triamcinolone (KENALOG) 0.1 % paste Apply one application to the affected ulcers twice daily until resolved, not to exceed 10 days Patient not taking: Reported on 06/04/2022 03/05/22   Maretta Bees, PA    Family History Family History  Problem Relation Age of Onset   Cancer Mother    Hypertension Mother    Esophageal cancer Mother    Cancer Father    Hypertension Father    Colon cancer Neg Hx    Colon polyps Neg Hx    Rectal cancer Neg Hx    Stomach cancer Neg Hx     Social History Social History   Tobacco Use   Smoking status: Light Smoker    Packs/day: .15    Types: Cigarettes   Smokeless tobacco: Never   Tobacco comments:    3 per day, working on quitting  Vaping Use   Vaping Use: Never used  Substance Use Topics   Alcohol use: No    Alcohol/week: 0.0 standard drinks of alcohol   Drug use: No     Allergies   Patient has no known allergies.   Review of Systems Review of Systems  Musculoskeletal:  Positive for arthralgias, gait problem and myalgias.  Skin: Negative.   All other systems reviewed and are negative.    Physical Exam Triage Vital Signs ED Triage Vitals  Enc Vitals Group     BP 07/02/22 1117 (!) 185/101     Pulse Rate 07/02/22 1117 68     Resp 07/02/22 1117 18     Temp 07/02/22 1117 97.8 F (36.6 C)     Temp Source 07/02/22 1117 Oral     SpO2 07/02/22 1117 94 %     Weight --      Height --      Head Circumference --      Peak Flow --      Pain Score 07/02/22 1115 8     Pain Loc --      Pain Edu? --      Excl. in GC? --    No data found.  Updated Vital Signs BP (!) 185/101 (BP Location: Left Arm)   Pulse 68   Temp 97.8 F (36.6 C) (Oral)   Resp 18   SpO2 94%   Visual Acuity Right Eye Distance:   Left Eye Distance:   Bilateral Distance:    Right Eye Near:   Left Eye Near:    Bilateral Near:     Physical  Exam Vitals and nursing note reviewed.  Constitutional:      General: He is not in acute distress.    Appearance: He is well-developed.  HENT:     Head: Normocephalic and atraumatic.  Eyes:  Conjunctiva/sclera: Conjunctivae normal.  Cardiovascular:     Rate and Rhythm: Normal rate and regular rhythm.     Pulses:          Dorsalis pedis pulses are 2+ on the left side.     Heart sounds: No murmur heard. Pulmonary:     Effort: Pulmonary effort is normal. No respiratory distress.     Breath sounds: Normal breath sounds.  Abdominal:     Palpations: Abdomen is soft.     Tenderness: There is no abdominal tenderness.  Musculoskeletal:        General: No swelling.     Cervical back: Neck supple.       Feet:  Skin:    General: Skin is warm and dry.     Capillary Refill: Capillary refill takes less than 2 seconds.  Neurological:     Mental Status: He is alert.  Psychiatric:        Mood and Affect: Mood normal.      UC Treatments / Results  Labs (all labs ordered are listed, but only abnormal results are displayed) Labs Reviewed - No data to display  EKG   Radiology DG Foot Complete Left  Result Date: 07/02/2022 CLINICAL DATA:  Patient states a break rotary fell on his left foot yesterday. Injury. EXAM: LEFT FOOT - COMPLETE 3+ VIEW COMPARISON:  None Available. FINDINGS: There is no evidence of fracture or dislocation. Mild degenerative changes of the interphalangeal joints. Small plantar calcaneal spur. Soft tissues are unremarkable. IMPRESSION: 1. No acute fracture or dislocation. 2. Mild degenerative changes of the interphalangeal joints. Electronically Signed   By: Larose Hires D.O.   On: 07/02/2022 11:36    Procedures Procedures (including critical care time)  Medications Ordered in UC Medications - No data to display  Initial Impression / Assessment and Plan / UC Course  I have reviewed the triage vital signs and the nursing notes.  Pertinent labs & imaging  results that were available during my care of the patient were reviewed by me and considered in my medical decision making (see chart for details).    Postop shoe given, patient verbalized understanding to this provider, patient requesting work note(given).  Ddx: Crush injury, foot contusion, occult fracture Final Clinical Impressions(s) / UC Diagnoses   Final diagnoses:  Crush injury of left foot, initial encounter  Left foot pain     Discharge Instructions      Your x-ray was negative for fracture or dislocation.  May take tylenol for pain as label directed.  Rest ice elevate, wear postop shoe for comfort.  Follow-up with PCP, return as needed.     ED Prescriptions   None    PDMP not reviewed this encounter.   Clancy Gourd, NP 07/02/22 1238

## 2022-07-02 NOTE — ED Triage Notes (Signed)
Pt states his employer sent him to be seen today.   He states a break rotor fell on his left foot yesterday. He states he hasn't taken any meds.

## 2022-07-02 NOTE — Discharge Instructions (Signed)
Your x-ray was negative for fracture or dislocation.  May take tylenol for pain as label directed.  Rest ice elevate, wear postop shoe for comfort.  Follow-up with PCP, return as needed.

## 2022-07-07 ENCOUNTER — Other Ambulatory Visit: Payer: Self-pay

## 2022-07-07 ENCOUNTER — Other Ambulatory Visit: Payer: 59

## 2022-07-07 DIAGNOSIS — B20 Human immunodeficiency virus [HIV] disease: Secondary | ICD-10-CM

## 2022-07-08 LAB — T-HELPER CELLS (CD4) COUNT (NOT AT ARMC)
CD4 % Helper T Cell: 40 % (ref 33–65)
CD4 T Cell Abs: 906 /uL (ref 400–1790)

## 2022-07-10 LAB — HIV-1 RNA QUANT-NO REFLEX-BLD
HIV 1 RNA Quant: <20 Copies/mL — ABNORMAL HIGH
HIV-1 RNA Quant, Log: <1.3 Log cps/mL — ABNORMAL HIGH

## 2022-07-14 ENCOUNTER — Telehealth: Payer: Self-pay | Admitting: Adult Health

## 2022-07-14 NOTE — Telephone Encounter (Signed)
Called patient to schedule Medicare Annual Wellness Visit (AWV). Left message for patient to call back and schedule Medicare Annual Wellness Visit (AWV).  Last date of AWV: 03/26/20  Please schedule an appointment at any time with [facility] or [facility].  If any questions, please contact me at [phone].  Thank you ,  Rudell Cobb AWV direct phone # [phone]

## 2022-07-15 ENCOUNTER — Other Ambulatory Visit: Payer: 59

## 2022-07-20 ENCOUNTER — Ambulatory Visit: Payer: 59 | Attending: Physician Assistant

## 2022-07-20 DIAGNOSIS — I10 Essential (primary) hypertension: Secondary | ICD-10-CM

## 2022-07-20 DIAGNOSIS — I2510 Atherosclerotic heart disease of native coronary artery without angina pectoris: Secondary | ICD-10-CM

## 2022-07-20 DIAGNOSIS — E785 Hyperlipidemia, unspecified: Secondary | ICD-10-CM

## 2022-07-20 DIAGNOSIS — Z79899 Other long term (current) drug therapy: Secondary | ICD-10-CM

## 2022-07-21 LAB — HEPATIC FUNCTION PANEL
ALT: 24 IU/L (ref 0–44)
AST: 26 IU/L (ref 0–40)
Albumin: 4.1 g/dL (ref 3.9–4.9)
Alkaline Phosphatase: 127 IU/L — ABNORMAL HIGH (ref 44–121)
Bilirubin Total: 0.7 mg/dL (ref 0.0–1.2)
Bilirubin, Direct: 0.15 mg/dL (ref 0.00–0.40)
Total Protein: 7.5 g/dL (ref 6.0–8.5)

## 2022-07-21 LAB — LIPID PANEL
Chol/HDL Ratio: 4.4 ratio (ref 0.0–5.0)
Cholesterol, Total: 128 mg/dL (ref 100–199)
HDL: 29 mg/dL — ABNORMAL LOW (ref >39)
LDL Chol Calc (NIH): 76 mg/dL (ref 0–99)
Triglycerides: 129 mg/dL (ref 0–149)
VLDL Cholesterol Cal: 23 mg/dL (ref 5–40)

## 2022-07-24 ENCOUNTER — Ambulatory Visit: Payer: 59 | Admitting: Infectious Diseases

## 2022-07-28 ENCOUNTER — Other Ambulatory Visit: Payer: Self-pay

## 2022-07-28 ENCOUNTER — Telehealth: Payer: Self-pay | Admitting: Cardiology

## 2022-07-28 DIAGNOSIS — B20 Human immunodeficiency virus [HIV] disease: Secondary | ICD-10-CM

## 2022-07-28 MED ORDER — DOVATO 50-300 MG PO TABS: 1.0000 | ORAL_TABLET | Freq: Every day | ORAL | 3 refills | Status: DC

## 2022-07-28 NOTE — Telephone Encounter (Signed)
Patient called to report he will be getting his prescriptions through [employer].

## 2022-07-28 NOTE — Telephone Encounter (Signed)
See MyChart message

## 2022-07-29 ENCOUNTER — Ambulatory Visit
Admission: RE | Admit: 2022-07-29 | Discharge: 2022-07-29 | Disposition: A | Discharge status: Home / Self Care | Payer: 59 | Source: Ambulatory Visit | Attending: Urology | Admitting: Urology

## 2022-07-29 MED ORDER — GADOPICLENOL 0.5 MMOL/ML IV SOLN
9.0000 mL | Freq: Once | INTRAVENOUS | Status: AC | PRN
  Administered 2022-07-29: 9 mL via INTRAVENOUS

## 2022-07-31 ENCOUNTER — Ambulatory Visit: Payer: 59 | Admitting: Infectious Diseases

## 2022-08-05 ENCOUNTER — Telehealth: Payer: Self-pay | Admitting: Adult Health

## 2022-08-05 NOTE — Telephone Encounter (Signed)
Called patient to schedule Medicare Annual Wellness Visit (AWV). Left message for patient to call back and schedule Medicare Annual Wellness Visit (AWV).  Last date of AWV: 03/26/20  Please schedule an appointment at any time with [facility] or [employer].  If any questions, please contact me at [phone].  Thank you ,  Rudell Cobb AWV direct phone # [phone]

## 2022-08-06 ENCOUNTER — Other Ambulatory Visit: Payer: Self-pay

## 2022-08-06 DIAGNOSIS — B20 Human immunodeficiency virus [HIV] disease: Secondary | ICD-10-CM

## 2022-08-06 MED ORDER — DOVATO 50-300 MG PO TABS: 1.0000 | ORAL_TABLET | Freq: Every day | ORAL | 3 refills | Status: DC

## 2022-08-18 ENCOUNTER — Other Ambulatory Visit: Payer: Self-pay | Admitting: Adult Health

## 2022-08-18 DIAGNOSIS — I10 Essential (primary) hypertension: Secondary | ICD-10-CM

## 2022-08-21 ENCOUNTER — Ambulatory Visit (HOSPITAL_COMMUNITY)
Admission: EM | Admit: 2022-08-21 | Discharge: 2022-08-21 | Disposition: A | Discharge status: Home / Self Care | Payer: 59 | Attending: Physician Assistant | Admitting: Physician Assistant

## 2022-08-21 ENCOUNTER — Encounter (HOSPITAL_COMMUNITY): Payer: Self-pay | Admitting: *Deleted

## 2022-08-21 DIAGNOSIS — I161 Hypertensive emergency: Secondary | ICD-10-CM

## 2022-08-21 DIAGNOSIS — H538 Other visual disturbances: Secondary | ICD-10-CM | POA: Diagnosis not present

## 2022-08-21 DIAGNOSIS — R519 Headache, unspecified: Secondary | ICD-10-CM | POA: Diagnosis not present

## 2022-08-21 HISTORY — DX: Non-ST elevation (NSTEMI) myocardial infarction: I21.4

## 2022-08-21 LAB — POCT FASTING CBG KUC MANUAL ENTRY: POCT Glucose (KUC): 102 mg/dL — AB (ref 70–99)

## 2022-08-21 NOTE — ED Provider Notes (Signed)
[facility]    CSN: [id number] Arrival date & time: 08/21/22  1206      History   Chief Complaint Chief Complaint  Patient presents with   Headache   Dizziness   Nasal Congestion    HPI Per Jamie Clark is a 72 y.o. male.   Patient presents today with a 1 week history of intermittent headache, dizziness, blurred vision.  Reports he has had persistent symptoms for the past 12 hours prompting evaluation.  He has been monitoring his blood pressure at home and reports this is generally been elevated with a reading of 177/110 yesterday.  He has been taking both ibuprofen and Sudafed as he initially thought symptoms could be related to sinuses.  He denies any chest pain, shortness of breath, leg swelling.  Does report associated dizziness which he describes as a room spinning sensation as well as bilateral blurred vision.  He denies any recent head injury or medication changes.  He does have a history of cardiovascular disease but denies history of stroke.  He does take antiplatelet medication and reports compliance with these medicines.    Past Medical History:  Diagnosis Date   Allergy    Anxiety    Depression    Hepatitis C, chronic (HCC)    HIV positive (HCC)    Hypertension    NSTEMI (non-ST elevated myocardial infarction) [facility])     Patient Active Problem List   Diagnosis Date Noted   Crush injury of left foot, initial encounter 07/02/2022   Coronary artery disease involving native coronary artery of native heart without angina pectoris 02/17/2022   Daytime sleepiness 02/17/2022   Snoring 02/17/2022   Poor appetite 02/17/2022   Hx of NSTEMI 12/2021 s/p PCI to LAD 01/18/2022   Insomnia 04/24/2020   Grief at loss of child 07/04/2019   Anxiety 12/22/2018   Prostatic hypertrophy 12/22/2018   Prediabetes 08/22/2018   Screen for STD (sexually transmitted disease) 08/22/2018   Left foot pain 04/30/2014   Hyperlipidemia LDL goal <70 01/17/2009   Essential  hypertension 01/17/2009   HIV infection (HCC) 12/18/2008   Hepatitis C virus infection cured after antiviral drug therapy 07/13/2007    Past Surgical History:  Procedure Laterality Date   CORONARY STENT INTERVENTION N/A 01/19/2022   Procedure: CORONARY STENT INTERVENTION;  Surgeon: Yvonne Kendall, MD;  [facility];  Service: Cardiovascular;  Laterality: N/A;   LEFT HEART CATH AND CORONARY ANGIOGRAPHY N/A 01/19/2022   Procedure: LEFT HEART CATH AND CORONARY ANGIOGRAPHY;  Surgeon: Yvonne Kendall, MD;  [facility];  Service: Cardiovascular;  Laterality: N/A;   NO PAST SURGERIES         Home Medications    Prior to Admission medications   Medication Sig Start Date End Date Taking? Authorizing Provider  aspirin EC 81 MG tablet Take 1 tablet (81 mg total) by mouth daily. Swallow whole. 01/21/22  Yes Perlie Gold, PA-C  atorvastatin (LIPITOR) 80 MG tablet Take 1 tablet (80 mg total) by mouth daily. 04/01/22  Yes Weaver, Scott T, PA-C  carvedilol (COREG) 6.25 MG tablet Take 1 tablet (6.25 mg total) by mouth 2 (two) times daily with a meal. 01/20/22  Yes Perlie Gold, PA-C  dolutegravir-lamiVUDine (DOVATO) 50-300 MG tablet Take 1 tablet by mouth daily. 08/06/22  Yes Blanchard Kelch, NP  ezetimibe (ZETIA) 10 MG tablet Take 1 tablet (10 mg total) by mouth daily. 05/20/22  Yes Conte, Tessa N, PA-C  finasteride (PROSCAR) 5 MG tablet TAKE  1 TABLET(5 MG) BY MOUTH DAILY 08/18/22  Yes Nafziger, Kandee Keen, NP  Olmesartan-amLODIPine-HCTZ 40-10-25 MG TABS Take 1 tablet by mouth daily. 02/16/22  Yes [provider]  prasugrel (EFFIENT) 10 MG TABS tablet Take 1 tablet (10 mg total) by mouth daily. 06/02/22  Yes Conte, Tessa N, PA-C  nitroGLYCERIN (NITROSTAT) 0.4 MG SL tablet Place 1 tablet (0.4 mg total) under the tongue every 5 (five) minutes x 3 doses as needed for chest pain. 05/20/22   Sharlene Dory, PA-C    Family History Family History  Problem Relation  Age of Onset   Cancer Mother    Hypertension Mother    Esophageal cancer Mother    Cancer Father    Hypertension Father    Colon cancer Neg Hx    Colon polyps Neg Hx    Rectal cancer Neg Hx    Stomach cancer Neg Hx     Social History Social History   Tobacco Use   Smoking status: Light Smoker    Packs/day: .15    Types: Cigarettes   Smokeless tobacco: Never   Tobacco comments:    3 per day, working on quitting  Vaping Use   Vaping Use: Never used  Substance Use Topics   Alcohol use: No    Alcohol/week: 0.0 standard drinks of alcohol   Drug use: No     Allergies   Patient has no known allergies.   Review of Systems Review of Systems  Constitutional:  Positive for activity change. Negative for appetite change, fatigue and fever.  Eyes:  Positive for visual disturbance. Negative for photophobia.  Respiratory:  Negative for cough and shortness of breath.   Cardiovascular:  Negative for chest pain.  Gastrointestinal:  Negative for abdominal pain, diarrhea, nausea and vomiting.  Neurological:  Positive for dizziness and headaches. Negative for syncope, facial asymmetry, speech difficulty, weakness, light-headedness and numbness.     Physical Exam Triage Vital Signs ED Triage Vitals  Enc Vitals Group     BP 08/21/22 1307 (!) 192/97     Pulse Rate 08/21/22 1307 65     Resp 08/21/22 1307 18     Temp 08/21/22 1307 97.9 F (36.6 C)     Temp Source 08/21/22 1307 Oral     SpO2 08/21/22 1307 95 %     Weight --      Height --      Head Circumference --      Peak Flow --      Pain Score 08/21/22 1303 7     Pain Loc --      Pain Edu? --      Excl. in GC? --    No data found.  Updated Vital Signs BP (!) 192/97 (BP Location: Right Arm)   Pulse 65   Temp 97.9 F (36.6 C) (Oral)   Resp 18   SpO2 95%   Visual Acuity Right Eye Distance:   Left Eye Distance:   Bilateral Distance:    Right Eye Near:   Left Eye Near:    Bilateral Near:     Physical  Exam Vitals reviewed.  Constitutional:      General: He is awake.     Appearance: Normal appearance. He is well-developed. He is not ill-appearing.     Comments: Very pleasant male appears stated age in no acute distress sitting comfortably in exam room  HENT:     Head: Normocephalic and atraumatic.     Right Ear: Tympanic membrane, ear  canal and external ear normal. No hemotympanum.     Left Ear: Ear canal and external ear normal. There is impacted cerumen. No hemotympanum.     Nose: Nose normal.     Mouth/Throat:     Tongue: Tongue does not deviate from midline.     Pharynx: Uvula midline. No oropharyngeal exudate or posterior oropharyngeal erythema.  Eyes:     Extraocular Movements: Extraocular movements intact.     Conjunctiva/sclera: Conjunctivae normal.     Pupils: Pupils are equal, round, and reactive to light.  Cardiovascular:     Rate and Rhythm: Normal rate and regular rhythm.     Heart sounds: Normal heart sounds, S1 normal and S2 normal. No murmur heard. Pulmonary:     Effort: Pulmonary effort is normal. No accessory muscle usage or respiratory distress.     Breath sounds: Normal breath sounds. No stridor. No wheezing, rhonchi or rales.     Comments: Clear to auscultation bilaterally Musculoskeletal:     Comments: Strength 5/5 bilateral upper and lower extremities  Neurological:     General: No focal deficit present.     Mental Status: He is alert and oriented to person, place, and time.     Cranial Nerves: Cranial nerves 2-12 are intact.     Motor: Motor function is intact.     Coordination: Coordination is intact.     Gait: Gait is intact.     Comments: No focal neurological defect on exam  Psychiatric:        Behavior: Behavior is cooperative.      UC Treatments / Results  Labs (all labs ordered are listed, but only abnormal results are displayed) Labs Reviewed  POCT FASTING CBG [facility] MANUAL ENTRY - Abnormal; Notable for the following components:      Result  Value   POCT Glucose ([facility]) 102 (*)    All other components within normal limits    EKG   Radiology No results found.  Procedures Procedures (including critical care time)  Medications Ordered in UC Medications - No data to display  Initial Impression / Assessment and Plan / UC Course  I have reviewed the triage vital signs and the nursing notes.  Pertinent labs & imaging results that were available during my care of the patient were reviewed by me and considered in my medical decision making (see chart for details).     Patient was noted to be markedly hypertensive on exam.  Blood sugar was appropriate at 102.  Physical exam was reassuring but given his constellation of symptoms including headache, dizziness, visual disturbance in the setting of high blood pressure recommend he go to the emergency room for further evaluation and management given our limited resources.  He is agreeable and will have his right take him directly to the ER.  He was stable at the time of discharge.  Final Clinical Impressions(s) / UC Diagnoses   Final diagnoses:  Hypertensive emergency  Nonintractable headache, unspecified chronicity pattern, unspecified headache type  Blurred vision     Discharge Instructions      Your blood pressure is very elevated I am concerned about something going on in your head given you are having headache, dizziness, blurred vision.  Go to the emergency room for further evaluation and management since we do not have the ability to do a CT scan.    ED Prescriptions   None    PDMP not reviewed this encounter.   Jeani Hawking, PA-C 08/21/22 1329

## 2022-08-21 NOTE — ED Triage Notes (Signed)
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    Pt states he has an appt with PCP next week but would liek to know if he can have something for anxiety

## 2022-08-21 NOTE — ED Notes (Signed)
Patient is being discharged from the Urgent Care and sent to the Emergency Department via POV . Per Dorann Ou, PA-C, patient is in need of higher level of care due to higher level of care needed. Patient is aware and verbalizes understanding of plan of care.  Vitals:   08/21/22 1307  BP: (!) 192/97  Pulse: 65  Resp: 18  Temp: 97.9 F (36.6 C)  SpO2: 95%

## 2022-08-21 NOTE — Discharge Instructions (Signed)
Your blood pressure is very elevated I am concerned about something going on in your head given you are having headache, dizziness, blurred vision.  Go to the emergency room for further evaluation and management since we do not have the ability to do a CT scan.

## 2022-08-31 ENCOUNTER — Encounter: Payer: Self-pay | Admitting: Cardiology

## 2022-08-31 NOTE — Telephone Encounter (Signed)
Error

## 2022-09-23 ENCOUNTER — Ambulatory Visit: Payer: 59 | Admitting: Podiatry

## 2022-10-22 ENCOUNTER — Other Ambulatory Visit: Payer: Self-pay | Admitting: Physician Assistant

## 2022-10-22 NOTE — Telephone Encounter (Signed)
Patient of Dr. Radford Pax. Please review for refill. Thank you!

## 2022-11-16 ENCOUNTER — Ambulatory Visit: Payer: 59 | Admitting: Cardiology

## 2023-01-04 ENCOUNTER — Ambulatory Visit: Payer: 59 | Admitting: Infectious Diseases

## 2023-01-08 ENCOUNTER — Ambulatory Visit (INDEPENDENT_AMBULATORY_CARE_PROVIDER_SITE_OTHER): Payer: 59 | Admitting: Infectious Diseases

## 2023-01-08 ENCOUNTER — Other Ambulatory Visit: Payer: Self-pay

## 2023-01-08 ENCOUNTER — Encounter: Payer: Self-pay | Admitting: Infectious Diseases

## 2023-01-08 VITALS — BP 134/84 | HR 78 | Temp 98.4°F | Ht 67.0 in | Wt 178.0 lb

## 2023-01-08 DIAGNOSIS — B20 Human immunodeficiency virus [HIV] disease: Secondary | ICD-10-CM

## 2023-01-08 DIAGNOSIS — I252 Old myocardial infarction: Secondary | ICD-10-CM | POA: Diagnosis not present

## 2023-01-08 DIAGNOSIS — I10 Essential (primary) hypertension: Secondary | ICD-10-CM

## 2023-01-08 DIAGNOSIS — K529 Noninfective gastroenteritis and colitis, unspecified: Secondary | ICD-10-CM

## 2023-01-08 DIAGNOSIS — D539 Nutritional anemia, unspecified: Secondary | ICD-10-CM | POA: Insufficient documentation

## 2023-01-08 MED ORDER — DOVATO 50-300 MG PO TABS: 1.0000 | ORAL_TABLET | Freq: Every day | ORAL | 11 refills | Status: DC

## 2023-01-08 NOTE — Assessment & Plan Note (Signed)
Newer fatigue in the setting of anemia (9.6 hgb). I asked him to please discuss at his cardiology FU next week as well.

## 2023-01-08 NOTE — Patient Instructions (Addendum)
Call the pharmacy to let them know that you would like to pick up your medication and stop mailing it  Phone: [phone]   Stop by our lab today   Next week - get your covid and flu shot when you feel better.   The tired feeling may be due to low red blood cells (anemia)   Schedule a lab visit in 1 month.   Schedule a flu and covid shot next week with our nursing team

## 2023-01-08 NOTE — Assessment & Plan Note (Addendum)
Refills on dovato and samples provided today. He is going to stop mail order and arrange pick up of his medication. His VL was 600 copies recently at PCP lab draw - may be sampling error (can have falsely high VL if not frozen quickly) vs him being off medication for 2 weeks. Will repeat VL in 50m and CBC  Flu shot and covid booster next week when he recovers from recent illness.   Discussed his CD4 has recovered back to healthy range > 500 cells. He was worried his CD4 was too low to fight off being sick. I reassured him that was not the case and his immune system continues to work quite well.

## 2023-01-08 NOTE — Assessment & Plan Note (Signed)
BP Readings from Last 3 Encounters:  01/08/23 134/84  08/21/22 (!) 192/97  07/02/22 (!) 185/101   Under much better control - managed by cardiology - has appt next week.

## 2023-01-08 NOTE — Assessment & Plan Note (Signed)
Sounds as if he had episode of acute gastroenteritis over weekend starting Sunday - today he has improved most symptoms but feels weak and fatigued. Nausea is gone and appetite has returned - continue supportive care, hand washing, etc.

## 2023-01-08 NOTE — Progress Notes (Signed)
Patient ID: Jamie Clark, male   DOB: [birth year], 72 y.o.   MRN: [id number]   CC:  Chief Complaint  Patient presents with   Follow-up     Previous Regimens:  Triumeq Dovato 05/2022   HPI Jamie Clark is here for follow up -   He started feeling ill Sunday - weakness (generalized), nauseated, vomiting/diarrhea, fatigue. No fevers/chills/sweating. Some abdominal pain. No weight loss. Diarrhea has since resolved.   He missed a delivery from the pharmacy and has been out of his medication for 2 weeks. Went to new PCP 2d ago and had labs done there. CD4 was 746 (37%), VL 600 copies, HgB down to 9.6 with MCV 115, RDW 18.4.   He has noticed fatigue for a while prior to his illness Sunday.    Review of Systems  Constitutional:  Positive for fatigue. Negative for appetite change, chills, fever and unexpected weight change.  Eyes:  Negative for visual disturbance.  Respiratory:  Negative for cough and shortness of breath.   Cardiovascular:  Negative for chest pain and leg swelling.  Gastrointestinal:  Positive for diarrhea, nausea and vomiting. Negative for abdominal pain.  Genitourinary:  Negative for dysuria, genital sores and penile discharge.  Musculoskeletal:  Negative for joint swelling.  Skin:  Negative for color change and rash.  Neurological:  Negative for dizziness and headaches.  Hematological:  Negative for adenopathy.  Psychiatric/Behavioral:  Negative for sleep disturbance. The patient is not nervous/anxious.       Outpatient Encounter Medications as of 01/08/2023  Medication Sig   aspirin EC 81 MG tablet Take 1 tablet (81 mg total) by mouth daily. Swallow whole.   atorvastatin (LIPITOR) 80 MG tablet Take 1 tablet (80 mg total) by mouth daily.   carvedilol (COREG) 6.25 MG tablet Take 1 tablet (6.25 mg total) by mouth 2 (two) times daily with a meal.   finasteride (PROSCAR) 5 MG tablet TAKE 1 TABLET(5 MG) BY MOUTH DAILY   nitroGLYCERIN (NITROSTAT) 0.4 MG SL  tablet Place 1 tablet (0.4 mg total) under the tongue every 5 (five) minutes x 3 doses as needed for chest pain.   Olmesartan-amLODIPine-HCTZ 40-10-25 MG TABS Take 1 tablet by mouth daily.   prasugrel (EFFIENT) 10 MG TABS tablet TAKE 1 TABLET(10 MG) BY MOUTH DAILY   [DISCONTINUED] dolutegravir-lamiVUDine (DOVATO) 50-300 MG tablet Take 1 tablet by mouth daily.   dolutegravir-lamiVUDine (DOVATO) 50-300 MG tablet Take 1 tablet by mouth daily.   ezetimibe (ZETIA) 10 MG tablet Take 1 tablet (10 mg total) by mouth daily. (Patient not taking: Reported on 01/08/2023)   No facility-administered encounter medications on file as of 01/08/2023.     Patient Active Problem List   Diagnosis Date Noted   Macrocytic anemia 01/08/2023   Gastroenteritis 01/08/2023   Crush injury of left foot, initial encounter 07/02/2022   Coronary artery disease involving native coronary artery of native heart without angina pectoris 02/17/2022   Daytime sleepiness 02/17/2022   Snoring 02/17/2022   Poor appetite 02/17/2022   Hx of NSTEMI 12/2021 s/p PCI to LAD 01/18/2022   Insomnia 04/24/2020   Grief at loss of child 07/04/2019   Anxiety 12/22/2018   Prostatic hypertrophy 12/22/2018   Prediabetes 08/22/2018   Screen for STD (sexually transmitted disease) 08/22/2018   Hyperlipidemia LDL goal <70 01/17/2009   Essential hypertension 01/17/2009   Human immunodeficiency virus (HIV) disease (HCC) 12/18/2008   Hepatitis C virus infection cured after antiviral drug therapy 07/13/2007     Health Maintenance Due  Topic Date Due   Zoster Vaccines- Shingrix (1 of 2) Never done   COVID-19 Vaccine (2 - Janssen risk series) 07/17/2019   Medicare Annual Wellness (AWV)  03/26/2021   INFLUENZA VACCINE  10/22/2022     Physical Exam  BP 134/84   Pulse 78   Temp 98.4 F (36.9 C) (Temporal)   Ht 5\' 7"  (1.702 m)   Wt 178 lb (80.7 kg)   SpO2 96%   BMI 27.88 kg/m     Physical Exam Vitals reviewed.  Constitutional:       Appearance: Normal appearance. He is not ill-appearing.  HENT:     Head: Normocephalic.     Mouth/Throat:     Mouth: Mucous membranes are moist.     Pharynx: Oropharynx is clear.  Eyes:     General: No scleral icterus. Cardiovascular:     Rate and Rhythm: Normal rate and regular rhythm.  Pulmonary:     Effort: Pulmonary effort is normal.  Musculoskeletal:        General: Normal range of motion.     Cervical back: Normal range of motion.  Skin:    Coloration: Skin is not jaundiced or pale.  Neurological:     Mental Status: He is alert and oriented to person, place, and time.  Psychiatric:        Mood and Affect: Mood normal.        Judgment: Judgment normal.      Lab Results  Component Value Date   CD4TCELL 40 07/07/2022   Lab Results  Component Value Date   CD4TABS 906 07/07/2022   CD4TABS 763 12/08/2021   CD4TABS 635 12/19/2020   Lab Results  Component Value Date   HIV1RNAQUANT <20 (H) 07/07/2022   Lab Results  Component Value Date   HEPBSAB NEG 05/06/2010   Lab Results  Component Value Date   LABRPR NON-REACTIVE 05/30/2021    CBC Lab Results  Component Value Date   WBC 7.1 01/28/2022   RBC 3.53 (L) 01/28/2022   HGB 13.5 01/28/2022   HCT 37.1 (L) 01/28/2022   PLT 368 01/28/2022   MCV 105 (H) 01/28/2022   MCH 38.2 (H) 01/28/2022   MCHC 36.4 (H) 01/28/2022   RDW 14.6 01/28/2022   LYMPHSABS 1.8 08/21/2021   MONOABS 0.3 08/21/2021   EOSABS 0.2 08/21/2021    BMET Lab Results  Component Value Date   NA 141 03/31/2022   K 4.0 03/31/2022   CL 105 03/31/2022   CO2 22 03/31/2022   GLUCOSE 101 (H) 03/31/2022   BUN 18 03/31/2022   CREATININE 1.16 03/31/2022   CALCIUM 9.4 03/31/2022   GFRNONAA >60 01/20/2022   GFRAA 70 12/14/2019    Assessment and Plan Problem List Items Addressed This Visit       Unprioritized   Essential hypertension (Chronic)    BP Readings from Last 3 Encounters:  01/08/23 134/84  08/21/22 (!) 192/97  07/02/22 (!)  185/101   Under much better control - managed by cardiology - has appt next week.        Gastroenteritis    Sounds as if he had episode of acute gastroenteritis over weekend starting Sunday - today he has improved most symptoms but feels weak and fatigued. Nausea is gone and appetite has returned - continue supportive care, hand washing, etc.       Human immunodeficiency virus (HIV) disease (HCC) - Primary    Refills on dovato and samples provided today. He is going to stop  mail order and arrange pick up of his medication. His VL was 600 copies recently at PCP lab draw - may be sampling error (can have falsely high VL if not frozen quickly) vs him being off medication for 2 weeks. Will repeat VL in 68m and CBC  Flu shot and covid booster next week when he recovers from recent illness.   Discussed his CD4 has recovered back to healthy range > 500 cells. He was worried his CD4 was too low to fight off being sick. I reassured him that was not the case and his immune system continues to work quite well.       Relevant Medications   dolutegravir-lamiVUDine (DOVATO) 50-300 MG tablet   Other Relevant Orders   HIV 1 RNA quant-no reflex-bld   Hx of NSTEMI 12/2021 s/p PCI to LAD    Newer fatigue in the setting of anemia (9.6 hgb). I asked him to please discuss at his cardiology FU next week as well.       Macrocytic anemia    New finding on recent labs - 45m ago hgb 12.8 and MCV 97. No other cytopenias. He had colonoscopy in 2021 and no polyps/findings that warranted repeat screening and cleared from further evaluations.  Will work up further with vitamin b12, folate and iron panel today.  If no abnormalities will repeat in 3-4w timed with his repeat VL - referral to hematology for further assistance with work up if persistent.  May be contributing to his fatigue but recent gastroenteritis confounds       Relevant Orders   Iron, TIBC and Ferritin Panel   B12 and Folate Panel   CBC w/Diff      Rexene Alberts, MSN, NP-C [facility]  [email or web address] Pager: [phone] Office: [phone] [facility] Main Line: [phone] *Secure Chat Communication Welcome

## 2023-01-08 NOTE — Assessment & Plan Note (Signed)
New finding on recent labs - 88m ago hgb 12.8 and MCV 97. No other cytopenias. He had colonoscopy in 2021 and no polyps/findings that warranted repeat screening and cleared from further evaluations.  Will work up further with vitamin b12, folate and iron panel today.  If no abnormalities will repeat in 3-4w timed with his repeat VL - referral to hematology for further assistance with work up if persistent.  May be contributing to his fatigue but recent gastroenteritis confounds

## 2023-01-09 LAB — B12 AND FOLATE PANEL
Folate: 16.8 ng/mL
Vitamin B-12: 174 pg/mL — ABNORMAL LOW (ref 200–1100)

## 2023-01-09 LAB — IRON,TIBC AND FERRITIN PANEL
%SAT: 21 % (ref 20–48)
Ferritin: 407 ng/mL — ABNORMAL HIGH (ref 24–380)
Iron: 47 ug/dL — ABNORMAL LOW (ref 50–180)
TIBC: 219 ug/dL — ABNORMAL LOW (ref 250–425)

## 2023-01-11 ENCOUNTER — Telehealth: Payer: Self-pay

## 2023-01-11 DIAGNOSIS — D539 Nutritional anemia, unspecified: Secondary | ICD-10-CM

## 2023-01-11 NOTE — Telephone Encounter (Addendum)
-----   Message from [place] sent at 01/11/2023  4:24 PM EDT ----- Actually he can pick these both up over the counter - his insurance will not cover these from what I am seeing.   One Iron tablet 325 mg once daily  AND One vitamin B 12 tablet 1000 or 2000 micrograms once daily.   -------------------------------------------------------------------------------------- Please call Jamie Clark to tell him both his vitamin B12 and iron levels are low.   I think the vitamin B12 is the main reason for the low red blood cell count but it would be best to start treatment for both to see if it helps the low red blood cell counts and his energy. Will be sending in 2 prescriptions for him   Please make sure he separates these from the Dovato by 6 hours to make sure the all absorb well.   Would like to see him repeat his lab work again in 2 months to check in - can be done here or at PCP. Whichever is easier.

## 2023-01-11 NOTE — Telephone Encounter (Signed)
Called Jamie Clark to go over lab results, relayed per [name] that vitamin B12 and iron levels are low and likely contributing to low red blood cell count and energy issues. Discussed supplements, [name] is driving and requested that the specific details be sent to him via MyChart. Discussed that he will need to separate these vitamins from his Dovato by 6 hours to ensure proper absorption.   He states his PCP recently started him on medications for sleep and to improve his appetite. He is not sure what these medications are called, but wants to make sure the B12 and iron will not interact with them. Encouraged him to call his PCP's office to notify them that he will be taking these supplements.   Per [name], okay to combine upcoming lab appointment (4 week) and 2 month recheck into one lab appointment 6 weeks from now.   Lab visit rescheduled for 12/6.    Sandie Ano, RN

## 2023-01-13 ENCOUNTER — Other Ambulatory Visit: Payer: Self-pay

## 2023-01-13 ENCOUNTER — Ambulatory Visit (INDEPENDENT_AMBULATORY_CARE_PROVIDER_SITE_OTHER): Payer: 59

## 2023-01-13 DIAGNOSIS — Z23 Encounter for immunization: Secondary | ICD-10-CM | POA: Diagnosis not present

## 2023-01-13 NOTE — Progress Notes (Signed)
Patient came into office for Flu and Covid vaccine.  Patient was kept 15 minutes after injection for observation since this was his first Covid vaccine. Patient left office in stable condition. [name], RMA

## 2023-01-14 ENCOUNTER — Other Ambulatory Visit: Payer: Self-pay | Admitting: Pharmacist

## 2023-01-14 DIAGNOSIS — B20 Human immunodeficiency virus [HIV] disease: Secondary | ICD-10-CM

## 2023-01-14 MED ORDER — DOVATO 50-300 MG PO TABS
1.0000 | ORAL_TABLET | Freq: Every day | ORAL | Status: AC
Start: 2023-01-14 — End: 2023-01-28

## 2023-01-14 NOTE — Progress Notes (Signed)
Medication Samples have been provided to the patient.  Drug name: Dovato        Strength: 50/300 mg         Qty: 14  Tablets (1 bottles) LOT: [id number]   Exp.Date: 11/25  Dosing instructions: Take one tablet by mouth once daily  The patient has been instructed regarding the correct time, dose, and frequency of taking this medication, including desired effects and most common side effects.   Margarite Gouge, PharmD, CPP, BCIDP, AAHIVP Clinical Pharmacist Practitioner Infectious Diseases Clinical Pharmacist [facility]

## 2023-01-15 ENCOUNTER — Ambulatory Visit: Payer: 59 | Admitting: Podiatry

## 2023-02-04 NOTE — Progress Notes (Signed)
Office Visit    Patient Name: Jamie Clark Date of Encounter: 02/05/2023  PCP:  Corliss Blacker, MD   [facility]  Cardiologist:  Armanda Magic, MD  Advanced Practice Provider:  No care team member to display Electrophysiologist:  None   HPI    Beauregard Zender is a 72 y.o. male with a history of anxiety, depression, hepatitis C, HIV positive, hypertension presents today for follow-up appointment.  He presented to the ED January 17, 2022 with chief complaint of chest pain and shortness of breath and found to have elevated troponin, 721>>> 1558.  Chest pain was associated with nausea, diaphoresis, vomiting as well as shortness of breath.  Chest pain is described as sharp, midsternal without radiation, had been having DOE for weeks.  Smokes about 4 cigarettes daily, denied any illicit drug use.  Chest x-ray revealed bibasilar atelectasis, otherwise nothing acute.  EKG revealed lateral ischemia, also had hypertensive urgency so it was felt elevated troponins could be due to ACS or demand ischemia, he had recently ran out of his medications.  Underwent left cardiac catheterization revealing severe single-vessel CAD with 90% proximal LAD stenosis.  There was mild, nonobstructive disease involving the distal LMCA and mid LAD.  Left circumflex and RCA were tortuous and mild luminal irregularities, normal EF 55 to 65% with upper normal filling pressure, LVEDP 15 mmHg.  Successful PCI to proximal LAD with drug-eluting stent with 0% residual stenosis.  DAPT with aspirin and Effient for at least 12 months.  He was seen in follow-up 01/28/2022 and at that time still having similar chest pain but not as often.  Described it as dull sternal chest pain with 4 out of 10 intensity not always associated with exertion but admitted to some shortness of breath with exertion.  Had noticed fatigue since leaving the hospital (STOP-BANG score of 5), notes poor appetite and having  some nausea in the a.m.  Had an episode of needing to take nitro and passed out, believes his blood sugar was low.  Experience weakness and blurry vision prior to syncopal episode.  Says he was down for about 10 minutes.  Has not had any recurrent symptoms.  Quit smoking cigarettes.  He was recently seen in urgent care 05/06/2022 for abdominal pain and constipation x 7 days.  No nausea.  Normal flatus.  He was seen by me 2/24, he feels okay from a heart standpoint.  He was recently in the urgent care for abdominal pain due to constipation.  We discussed MiraLAX use every other day as well as proper hydration.  He is asked for nitroglycerin prescription today which we will provide.  No further chest pains since his stent placement.  LDL is above goal so he will get a lipid panel today.  We have discussed starting Zetia as well.  He at one point was given instructions on how to do an at home sleep study and never got a message from [place] regarding his pin.  I will send her message today to reorient the patient.  Otherwise, doing well without any shortness of breath.  He works at [employer] and has an active job.  Today, he presents with a history of mild coronary disease, prediabetes, and high cholesterol, presents for a routine follow-up. He reports no current symptoms of chest pain or shortness of breath. The patient's cholesterol levels are close to the target, with an LDL of 76, and his blood pressure is well-controlled with medication. The patient is  currently on multiple medications including Zetia, Lipitor, olmesartan, amiodarone, HCTZ, carvedilol, aspirin, and Effient.  However, the sleep study has not yet been arranged due to a change in the patient's living situation and a lack of follow-up from the sleep study coordinator.I will reach out today and try to get arranged.   Reports no shortness of breath nor dyspnea on exertion. Reports no chest pain, pressure, or tightness. No edema, orthopnea, PND.  Reports no palpitations.    Discussed the use of AI scribe software for clinical note transcription with the patient, who gave verbal consent to proceed.  Past Medical History    Past Medical History:  Diagnosis Date   Allergy    Anxiety    Depression    Hepatitis C, chronic (HCC)    HIV positive (HCC)    Hypertension    NSTEMI (non-ST elevated myocardial infarction) (HCC)    Past Surgical History:  Procedure Laterality Date   CORONARY STENT INTERVENTION N/A 01/19/2022   Procedure: CORONARY STENT INTERVENTION;  Surgeon: Yvonne Kendall, MD;  Location: [facility] INVASIVE CV LAB;  Service: Cardiovascular;  Laterality: N/A;   LEFT HEART CATH AND CORONARY ANGIOGRAPHY N/A 01/19/2022   Procedure: LEFT HEART CATH AND CORONARY ANGIOGRAPHY;  Surgeon: Yvonne Kendall, MD;  Location: [facility] INVASIVE CV LAB;  Service: Cardiovascular;  Laterality: N/A;   NO PAST SURGERIES      Allergies  No Known Allergies  EKGs/Labs/Other Studies Reviewed:   The following studies were reviewed today:  Cardiac cath 01/19/22  Left Main  Vessel is large.  Dist LM lesion is 15% stenosed.    Left Anterior Descending  Vessel is large.  Prox LAD lesion is 90% stenosed. Vessel is the culprit lesion. The lesion is thrombotic.  Mid LAD lesion is 20% stenosed.    First Diagonal Branch  Vessel is small in size. Vessel is angiographically normal.    Second Diagonal Branch  Vessel is moderate in size.    Third Diagonal Branch  Vessel is moderate in size.    Left Circumflex  Vessel is large. The vessel exhibits minimal luminal irregularities. The vessel is moderately tortuous.    First Obtuse Marginal Branch  Vessel is large in size. There is mild disease in the vessel.    Second Obtuse Marginal Branch  Vessel is moderate in size.    Third Obtuse Marginal Branch  Vessel is small in size.    Right Coronary Artery  Vessel is moderate in size. Vessel is angiographically normal. The vessel is severely  tortuous.    Right Posterior Descending Artery  Vessel is moderate in size.    Right Posterior Atrioventricular Artery  Vessel is large in size.    First Right Posterolateral Branch  Vessel is moderate in size.    Second Right Posterolateral Branch  Vessel is small in size.    Intervention   Prox LAD lesion  Stent  CATH VISTA GUIDE 6FR XBLAD4 guide catheter was inserted. Lesion crossed with guidewire using a WIRE HI TORQ BMW 190CM. Pre-stent angioplasty was performed using a BALLN SAPPHIRE 2.5X12. Maximum pressure: 12 atm. A drug-eluting stent was successfully placed using a SYNERGY XD 3.0X16. Maximum pressure: 14 atm. Stent strut is well apposed. Post-stent angioplasty was performed using a BALL SAPPHIRE NC24 3.25X12. Maximum pressure: 16 atm.  Post-Intervention Lesion Assessment  The intervention was successful. Pre-interventional TIMI flow is 3. Post-intervention TIMI flow is 3. No complications occurred at this lesion.  There is a 0% residual stenosis post intervention.  Wall Motion              Left Heart  Left Ventricle The left ventricular size is normal. The left ventricular systolic function is normal. LV end diastolic pressure is normal. LVEDP 15 mmHg. The left ventricular ejection fraction is 55-65% by visual estimate. No regional wall motion abnormalities.  Aortic Valve There is no aortic valve stenosis.   Coronary Diagrams  Diagnostic Dominance: Right  Intervention      EKG:  EKG is not ordered today.    Recent Labs: 03/31/2022: BUN 18; Creatinine, Ser 1.16; Potassium 4.0; Sodium 141 07/20/2022: ALT 24  Recent Lipid Panel    Component Value Date/Time   CHOL 128 07/20/2022 0822   CHOL 170 03/30/2013 0000   TRIG 129 07/20/2022 0822   TRIG 148 03/30/2013 0000   HDL 29 (L) 07/20/2022 0822   CHOLHDL 4.4 07/20/2022 0822   CHOLHDL 7.1 01/18/2022 0529   VLDL 22 01/18/2022 0529   LDLCALC 76 07/20/2022 0822   LDLCALC 133 (H) 12/19/2020 1030    LDLCALC 93 10/12/2012 0000    Home Medications   Current Meds  Medication Sig   aspirin EC 81 MG tablet Take 1 tablet (81 mg total) by mouth daily. Swallow whole.   atorvastatin (LIPITOR) 80 MG tablet Take 1 tablet (80 mg total) by mouth daily.   carvedilol (COREG) 6.25 MG tablet Take 1 tablet (6.25 mg total) by mouth 2 (two) times daily with a meal.   dolutegravir-lamiVUDine (DOVATO) 50-300 MG tablet Take 1 tablet by mouth daily.   ezetimibe (ZETIA) 10 MG tablet Take 1 tablet (10 mg total) by mouth daily.   finasteride (PROSCAR) 5 MG tablet TAKE 1 TABLET(5 MG) BY MOUTH DAILY   nitroGLYCERIN (NITROSTAT) 0.4 MG SL tablet Place 1 tablet (0.4 mg total) under the tongue every 5 (five) minutes x 3 doses as needed for chest pain.   Olmesartan-amLODIPine-HCTZ 40-10-25 MG TABS Take 1 tablet by mouth daily.   prasugrel (EFFIENT) 10 MG TABS tablet TAKE 1 TABLET(10 MG) BY MOUTH DAILY     Review of Systems      All other systems reviewed and are otherwise negative except as noted above.  Physical Exam    VS:  BP (!) 138/98   Pulse 66   Ht 5\' 8"  (1.727 m)   Wt 173 lb 3.2 oz (78.6 kg)   SpO2 98%   BMI 26.33 kg/m  , BMI Body mass index is 26.33 kg/m.  Wt Readings from Last 3 Encounters:  02/05/23 173 lb 3.2 oz (78.6 kg)  01/08/23 178 lb (80.7 kg)  06/04/22 134 lb (60.8 kg)     GEN: Well nourished, well developed, in no acute distress. HEENT: normal. Neck: Supple, no JVD, carotid bruits, or masses. Cardiac: RRR, no murmurs, rubs, or gallops. No clubbing, cyanosis, edema.  Radials/PT 2+ and equal bilaterally.  Respiratory:  Respirations regular and unlabored, clear to auscultation bilaterally. GI: Soft, nontender, nondistended. MS: No deformity or atrophy. Skin: Warm and dry, no rash. Neuro:  Strength and sensation are intact. Psych: Normal affect.  Assessment & Plan   Hyperlipidemia -LDL reduction with addition of Zetia -continue to monitor annually  Prediabetes -Hemoglobin  A1c 5.7 -continue to work on blood glucose reduction  Coronary Artery Disease s/p NSTEMI with DES to pLAD Stable with no new symptoms. LDL slightly above goal at 76. On Lipitor and Zetia. -Continue current medications. -Consider rechecking lipid panel in the next year with primary care provider.  Hypertension  Blood pressure elevated in office, but patient reports normal readings at home. On Olmesartan/Amiodarone/HCTZ and Carvedilol. -Continue current medications, he has not taken his medications yet today -Monitor blood pressure at home and report if bottom number starts to increase.  Sleep Apnea High risk based on STOP-BANG score (5). Previous attempt at home sleep study unsuccessful due to patient moving and losing equipment. -Reach out to sleep study coordinator to arrange for in-lab sleep study.  General Health Maintenance -Continue daily Aspirin and Effient (Prasugrel) with no reported bleeding. -Plan for annual follow-up or sooner if new symptoms develop.    Disposition: Follow up 1 year with Armanda Magic, MD or APP.  Signed, Sharlene Dory, PA-C 02/05/2023, 9:54 AM [facility]

## 2023-02-05 ENCOUNTER — Ambulatory Visit: Payer: 59 | Attending: Cardiology | Admitting: Physician Assistant

## 2023-02-05 ENCOUNTER — Encounter: Payer: Self-pay | Admitting: Physician Assistant

## 2023-02-05 VITALS — BP 138/98 | HR 66 | Ht 68.0 in | Wt 173.2 lb

## 2023-02-05 DIAGNOSIS — G4733 Obstructive sleep apnea (adult) (pediatric): Secondary | ICD-10-CM

## 2023-02-05 DIAGNOSIS — I2510 Atherosclerotic heart disease of native coronary artery without angina pectoris: Secondary | ICD-10-CM

## 2023-02-05 DIAGNOSIS — E785 Hyperlipidemia, unspecified: Secondary | ICD-10-CM

## 2023-02-05 DIAGNOSIS — I10 Essential (primary) hypertension: Secondary | ICD-10-CM

## 2023-02-05 DIAGNOSIS — R7303 Prediabetes: Secondary | ICD-10-CM | POA: Diagnosis not present

## 2023-02-05 NOTE — Patient Instructions (Signed)
Medication Instructions:  Your physician recommends that you continue on your current medications as directed. Please refer to the Current Medication list given to you today.  *If you need a refill on your cardiac medications before your next appointment, please call your pharmacy*   Lab Work: None ordered  If you have labs (blood work) drawn today and your tests are completely normal, you will receive your results only by: MyChart Message (if you have MyChart) OR A paper copy in the mail If you have any lab test that is abnormal or we need to change your treatment, we will call you to review the results.   Testing/Procedures: None ordered   Follow-Up: At [facility], you and your health needs are our priority.  As part of our continuing mission to provide you with exceptional heart care, we have created designated Provider Care Teams.  These Care Teams include your primary Cardiologist (physician) and Advanced Practice Providers (APPs -  Physician Assistants and Nurse Practitioners) who all work together to provide you with the care you need, when you need it.  We recommend signing up for the patient portal called "MyChart".  Sign up information is provided on this After Visit Summary.  MyChart is used to connect with patients for Virtual Visits (Telemedicine).  Patients are able to view lab/test results, encounter notes, upcoming appointments, etc.  Non-urgent messages can be sent to your provider as well.   To learn more about what you can do with MyChart, go to [email or web address].    Your next appointment:   6 month(s)  Provider:   Armanda Magic, MD     Other Instructions Just keep an eye out on your blood pressure, if your bottom # is consistently above 85 call us.

## 2023-02-10 ENCOUNTER — Other Ambulatory Visit: Payer: Self-pay

## 2023-02-26 ENCOUNTER — Other Ambulatory Visit: Payer: 59

## 2023-02-26 ENCOUNTER — Other Ambulatory Visit: Payer: Self-pay

## 2023-02-26 DIAGNOSIS — B20 Human immunodeficiency virus [HIV] disease: Secondary | ICD-10-CM

## 2023-02-26 DIAGNOSIS — D539 Nutritional anemia, unspecified: Secondary | ICD-10-CM

## 2023-03-01 LAB — CBC WITH DIFFERENTIAL/PLATELET
Absolute Lymphocytes: 2266 {cells}/uL (ref 850–3900)
Absolute Monocytes: 418 {cells}/uL (ref 200–950)
Basophils Absolute: 19 {cells}/uL (ref 0–200)
Basophils Relative: 0.4 %
Eosinophils Absolute: 211 {cells}/uL (ref 15–500)
Eosinophils Relative: 4.4 %
HCT: 38.4 % — ABNORMAL LOW (ref 38.5–50.0)
Hemoglobin: 12.4 g/dL — ABNORMAL LOW (ref 13.2–17.1)
MCH: 32 pg (ref 27.0–33.0)
MCHC: 32.3 g/dL (ref 32.0–36.0)
MCV: 99 fL (ref 80.0–100.0)
MPV: 10.9 fL (ref 7.5–12.5)
Monocytes Relative: 8.7 %
Neutro Abs: 1886 {cells}/uL (ref 1500–7800)
Neutrophils Relative %: 39.3 %
Platelets: 257 10*3/uL (ref 140–400)
RBC: 3.88 10*6/uL — ABNORMAL LOW (ref 4.20–5.80)
RDW: 15.5 % — ABNORMAL HIGH (ref 11.0–15.0)
Total Lymphocyte: 47.2 %
WBC: 4.8 10*3/uL (ref 3.8–10.8)

## 2023-03-01 LAB — HIV-1 RNA QUANT-NO REFLEX-BLD
HIV 1 RNA Quant: NOT DETECTED {copies}/mL
HIV-1 RNA Quant, Log: NOT DETECTED {Log_copies}/mL

## 2023-03-01 LAB — IRON,TIBC AND FERRITIN PANEL
%SAT: 27 % (ref 20–48)
Ferritin: 135 ng/mL (ref 24–380)
Iron: 63 ug/dL (ref 50–180)
TIBC: 232 ug/dL — ABNORMAL LOW (ref 250–425)

## 2023-03-01 LAB — VITAMIN B12: Vitamin B-12: 487 pg/mL (ref 200–1100)

## 2023-04-05 ENCOUNTER — Other Ambulatory Visit (HOSPITAL_COMMUNITY): Payer: Self-pay

## 2023-04-26 ENCOUNTER — Encounter (HOSPITAL_COMMUNITY): Payer: Self-pay

## 2023-04-26 ENCOUNTER — Ambulatory Visit (INDEPENDENT_AMBULATORY_CARE_PROVIDER_SITE_OTHER): Payer: 59

## 2023-04-26 ENCOUNTER — Ambulatory Visit (HOSPITAL_COMMUNITY)
Admission: EM | Admit: 2023-04-26 | Discharge: 2023-04-26 | Disposition: A | Discharge status: Home / Self Care | Payer: 59 | Attending: Physician Assistant | Admitting: Physician Assistant

## 2023-04-26 DIAGNOSIS — M545 Low back pain, unspecified: Secondary | ICD-10-CM | POA: Diagnosis not present

## 2023-04-26 DIAGNOSIS — M542 Cervicalgia: Secondary | ICD-10-CM
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
## 2023-05-06 ENCOUNTER — Other Ambulatory Visit: Payer: Self-pay

## 2023-05-06 ENCOUNTER — Other Ambulatory Visit (HOSPITAL_COMMUNITY): Payer: Self-pay

## 2023-05-06 ENCOUNTER — Encounter: Payer: Self-pay | Admitting: Infectious Diseases

## 2023-05-06 ENCOUNTER — Ambulatory Visit (INDEPENDENT_AMBULATORY_CARE_PROVIDER_SITE_OTHER): Payer: Medicare Other | Admitting: Infectious Diseases

## 2023-05-06 DIAGNOSIS — B20 Human immunodeficiency virus [HIV] disease: Secondary | ICD-10-CM

## 2023-05-06 DIAGNOSIS — E538 Deficiency of other specified B group vitamins: Secondary | ICD-10-CM

## 2023-05-06 DIAGNOSIS — I10 Essential (primary) hypertension: Secondary | ICD-10-CM | POA: Diagnosis not present

## 2023-05-06 NOTE — Patient Instructions (Addendum)
You look great as always.   Make sure you separate the vitamins from your Dovato by at least 4 hours. Don't take them at the same time.  To make this easier move your vitamins to the evening and take your other prescribed medications in the morning   No labs today- let's have you back in 4 months to recheck blood work then and see me again.   Your patient assistance is approved - Please call Porfirio Mylar to have her help figure out the next steps for the pharmacy. She should be able to provide them with the account number for [employer] approval for your Dovato.

## 2023-05-06 NOTE — Progress Notes (Signed)
Patient ID: Jamie Clark, male   DOB: [birth year], 73 y.o.   MRN: [id number]  Subjective   CC:  Chief Complaint  Patient presents with   Follow-up     Previous Regimens:  Triumeq Dovato 05/2022   Discussed the use of AI scribe software for clinical note transcription with the patient, who gave verbal consent to proceed.  History of Present Illness   Jamie Clark is a 73 year old male with HIV who presents with medication management issues.  He is experiencing difficulties obtaining a refill for his HIV medication, Dovato, due to recent changes in his insurance. He contacted [facility] for a refill but was informed that they needed to verify his insurance. He was told to expect a response by Friday. He is concerned about the cost of the medication, which is $1800.  He is currently down to two doses of Dovato in his bottle and has a small box of pills in a sample remaining. He prefers having a supply of pills available and is concerned about the pharmacy's handling of his medication needs.  He has been on Dovato since switching from Triumeq after experiencing heart attack. He has a history of taking Triumeq since 2015. Dovato works well for him, and he manages to take his pills regularly, although he sometimes forgets. He is asking about the injectable options for treatment.   He has been drug-free for 17 years and reports improved energy levels after taking vitamin supplements, which have helped with his anemia. His coworkers have noticed an increase in his energy levels. He is currently taking vitamin B12 supplements, which have improved his previously low levels.       Review of Systems  Constitutional:  Negative for chills and fever.  HENT:  Negative for sore throat.   Respiratory:  Negative for cough and shortness of breath.   Cardiovascular: Negative.   Gastrointestinal:  Negative for abdominal pain, diarrhea and vomiting.  Musculoskeletal:  Negative for  myalgias and neck pain.  Skin:  Negative for rash.  Neurological:  Negative for headaches.  Psychiatric/Behavioral:  The patient is not nervous/anxious.       Outpatient Encounter Medications as of 05/06/2023  Medication Sig   aspirin EC 81 MG tablet Take 1 tablet (81 mg total) by mouth daily. Swallow whole.   atorvastatin (LIPITOR) 80 MG tablet Take 1 tablet (80 mg total) by mouth daily.   carvedilol (COREG) 6.25 MG tablet Take 1 tablet (6.25 mg total) by mouth 2 (two) times daily with a meal.   dolutegravir-lamiVUDine (DOVATO) 50-300 MG tablet Take 1 tablet by mouth daily.   ezetimibe (ZETIA) 10 MG tablet Take 1 tablet (10 mg total) by mouth daily.   finasteride (PROSCAR) 5 MG tablet TAKE 1 TABLET(5 MG) BY MOUTH DAILY   nitroGLYCERIN (NITROSTAT) 0.4 MG SL tablet Place 1 tablet (0.4 mg total) under the tongue every 5 (five) minutes x 3 doses as needed for chest pain.   Olmesartan-amLODIPine-HCTZ 40-10-25 MG TABS Take 1 tablet by mouth daily.   prasugrel (EFFIENT) 10 MG TABS tablet TAKE 1 TABLET(10 MG) BY MOUTH DAILY   No facility-administered encounter medications on file as of 05/06/2023.     Patient Active Problem List   Diagnosis Date Noted   Macrocytic anemia 01/08/2023   Gastroenteritis 01/08/2023   Crush injury of left foot, initial encounter 07/02/2022   Coronary artery disease involving native coronary artery of native heart without angina pectoris 02/17/2022   Daytime sleepiness 02/17/2022  Snoring 02/17/2022   Poor appetite 02/17/2022   Hx of NSTEMI 12/2021 s/p PCI to LAD 01/18/2022   Insomnia 04/24/2020   Grief at loss of child 07/04/2019   Anxiety 12/22/2018   Prostatic hypertrophy 12/22/2018   Prediabetes 08/22/2018   Screen for STD (sexually transmitted disease) 08/22/2018   Hyperlipidemia LDL goal <70 01/17/2009   Essential hypertension 01/17/2009   Human immunodeficiency virus (HIV) disease (HCC) 12/18/2008   Hepatitis C virus infection cured after antiviral  drug therapy 07/13/2007     Health Maintenance Due  Topic Date Due   Zoster Vaccines- Shingrix (1 of 2) Never done   Medicare Annual Wellness (AWV)  03/26/2021   COVID-19 Vaccine (3 - 2024-25 season) 03/10/2023      Objective   BP (!) 174/92   Pulse 66   Temp 97.8 F (36.6 C) (Oral)   Ht 5\' 8"  (1.727 m)   Wt 178 lb (80.7 kg)   SpO2 96%   BMI 27.06 kg/m     Physical Exam Vitals reviewed.  Constitutional:      Appearance: Normal appearance. He is not ill-appearing.  HENT:     Head: Normocephalic.     Mouth/Throat:     Mouth: Mucous membranes are moist.     Pharynx: Oropharynx is clear.  Eyes:     General: No scleral icterus. Cardiovascular:     Rate and Rhythm: Normal rate and regular rhythm.  Pulmonary:     Effort: Pulmonary effort is normal.  Musculoskeletal:        General: Normal range of motion.     Cervical back: Normal range of motion.  Skin:    Coloration: Skin is not jaundiced or pale.  Neurological:     Mental Status: He is alert and oriented to person, place, and time.  Psychiatric:        Mood and Affect: Mood normal.        Judgment: Judgment normal.    HIV 1 RNA Quant (Copies/mL)  Date Value  02/26/2023 Not Detected  07/07/2022 <20 (H)  12/08/2021 <20 (H)   HIV-1 RNA Viral Load (no units)  Date Value  07/18/2013 <50  05/23/2013 <50  04/25/2013 <50   CD4 (no units)  Date Value  07/18/2013 927  05/23/2013 991  04/25/2013 837   CD4 T Cell Abs (/uL)  Date Value  07/07/2022 906  12/08/2021 763  12/19/2020 635    Assessment and Plan Assessment and Plan    HIV -  Stable on Dovato. Discussed the option of switching to Cabenuva injections, but patient prefers to continue with oral medication. Pharmacy issue with obtaining Dovato due to insurance change. -Continue Dovato. -SPEP approved - helped him navigate what to do next for getting his medications.  -Gave one more 2 week sample supply today for buffer to avoid stress with  medication delays. -No labs today - repeat next OV in 52m.  -On statin for secondary prevention -No need for anal cancer screening -No dental needs -Mood continues to do well.   Vitamin B12 Deficiency Improved energy levels since starting vitamin B12 supplementation. Recent labs show normalization of previously low B12 levels. -Continue vitamin B12 supplementation. -Advise patient to separate vitamin B12 and Dovato dosing by at least 4 hours to ensure optimal absorption of Dovato. -Repeat CBC an B12/iron next OV.    Hypertension -  Uncontrolled today. Asked him to make sure he takes his blood pressure medications > 1 hour before doctor visits to ensure we can  adjust correctly. He took them shortly before today's visit.      No orders of the defined types were placed in this encounter.  No orders of the defined types were placed in this encounter.  Return in about 4 months (around 09/03/2023).    Rexene Alberts, MSN, NP-C [facility]  [email or web address] Pager: [phone] Office: [phone] [facility] Main Line: [phone] *Secure Chat Communication Welcome

## 2023-05-10 ENCOUNTER — Other Ambulatory Visit (HOSPITAL_COMMUNITY): Payer: Self-pay

## 2023-05-11 ENCOUNTER — Telehealth: Payer: Self-pay | Admitting: Podiatry

## 2023-05-11 ENCOUNTER — Telehealth: Payer: Self-pay

## 2023-05-11 NOTE — Telephone Encounter (Signed)
No voicemail; resched due to weather 05/12/23

## 2023-05-11 NOTE — Telephone Encounter (Signed)
[facility] Patient Advocate Encounter   I was successful in securing patient a $ 2000.00 grant from [employer] to provide copayment coverage for Dovato.  The patient's out of pocket cost will be 0.00 monthly.     I have spoken with the patient.    The billing information is as follows and has been shared with [facility].          Dates of Eligibility: 05/10/23 through 03/22/24  Patient knows to call the office with questions or concerns.  Clearance Coots, CPhT Specialty Pharmacy Patient [facility] Phone: [phone] Fax:  [phone]

## 2023-05-12 ENCOUNTER — Ambulatory Visit: Payer: 59 | Admitting: Podiatry

## 2023-05-12 ENCOUNTER — Other Ambulatory Visit: Payer: Self-pay | Admitting: Pharmacist

## 2023-05-12 MED ORDER — DOVATO 50-300 MG PO TABS: 1.0000 | ORAL_TABLET | Freq: Every day | ORAL | Status: AC

## 2023-05-12 NOTE — Progress Notes (Signed)
Medication Samples have been provided to the patient.  Drug name: Dovato        Strength: 50/300 mg         Qty: 14  Tablets (1 bottles) LOT: [id number]   Exp.Date: 7/26  Dosing instructions: Take one tablet by mouth once daily  The patient has been instructed regarding the correct time, dose, and frequency of taking this medication, including desired effects and most common side effects.   Margarite Gouge, PharmD, CPP, BCIDP, AAHIVP Clinical Pharmacist Practitioner Infectious Diseases Clinical Pharmacist [facility]

## 2023-05-25 ENCOUNTER — Ambulatory Visit (INDEPENDENT_AMBULATORY_CARE_PROVIDER_SITE_OTHER): Payer: Medicare Other | Admitting: Podiatry

## 2023-05-25 ENCOUNTER — Encounter: Payer: Self-pay | Admitting: Podiatry

## 2023-05-25 VITALS — Ht 68.0 in | Wt 178.0 lb

## 2023-05-25 DIAGNOSIS — M79674 Pain in right toe(s): Secondary | ICD-10-CM | POA: Diagnosis not present

## 2023-05-25 DIAGNOSIS — B351 Tinea unguium: Secondary | ICD-10-CM | POA: Diagnosis not present

## 2023-05-25 DIAGNOSIS — M79675 Pain in left toe(s): Secondary | ICD-10-CM | POA: Diagnosis not present

## 2023-05-26 ENCOUNTER — Encounter (HOSPITAL_BASED_OUTPATIENT_CLINIC_OR_DEPARTMENT_OTHER): Payer: Self-pay

## 2023-05-26 ENCOUNTER — Other Ambulatory Visit: Payer: Self-pay

## 2023-05-26 ENCOUNTER — Emergency Department (HOSPITAL_BASED_OUTPATIENT_CLINIC_OR_DEPARTMENT_OTHER)

## 2023-05-26 ENCOUNTER — Emergency Department (HOSPITAL_BASED_OUTPATIENT_CLINIC_OR_DEPARTMENT_OTHER)
Admission: EM | Admit: 2023-05-26 | Discharge: 2023-05-27 | Disposition: A | Discharge status: Home / Self Care | Attending: Emergency Medicine | Admitting: Emergency Medicine

## 2023-05-26 DIAGNOSIS — K529 Noninfective gastroenteritis and colitis, unspecified: Secondary | ICD-10-CM | POA: Diagnosis not present

## 2023-05-26 DIAGNOSIS — K5730 Diverticulosis of large intestine without perforation or abscess without bleeding: Secondary | ICD-10-CM | POA: Insufficient documentation

## 2023-05-26 DIAGNOSIS — K4090 Unilateral inguinal hernia, without obstruction or gangrene, not specified as recurrent: Secondary | ICD-10-CM | POA: Insufficient documentation

## 2023-05-26 DIAGNOSIS — R918 Other nonspecific abnormal finding of lung field: Secondary | ICD-10-CM | POA: Diagnosis not present

## 2023-05-26 DIAGNOSIS — B1920 Unspecified viral hepatitis C without hepatic coma: Secondary | ICD-10-CM | POA: Insufficient documentation

## 2023-05-26 DIAGNOSIS — R109 Unspecified abdominal pain: Secondary | ICD-10-CM | POA: Diagnosis present

## 2023-05-26 DIAGNOSIS — Z79899 Other long term (current) drug therapy: Secondary | ICD-10-CM | POA: Insufficient documentation

## 2023-05-26 DIAGNOSIS — I10 Essential (primary) hypertension: Secondary | ICD-10-CM | POA: Diagnosis not present

## 2023-05-26 DIAGNOSIS — Z21 Asymptomatic human immunodeficiency virus [HIV] infection status: Secondary | ICD-10-CM | POA: Insufficient documentation

## 2023-05-26 DIAGNOSIS — I700 Atherosclerosis of aorta: Secondary | ICD-10-CM | POA: Diagnosis not present

## 2023-05-26 DIAGNOSIS — Z7982 Long term (current) use of aspirin: Secondary | ICD-10-CM | POA: Insufficient documentation

## 2023-05-26 DIAGNOSIS — N400 Enlarged prostate without lower urinary tract symptoms: Secondary | ICD-10-CM | POA: Diagnosis not present

## 2023-05-26 LAB — URINALYSIS, ROUTINE W REFLEX MICROSCOPIC
Bilirubin Urine: NEGATIVE
Glucose, UA: NEGATIVE mg/dL
Hgb urine dipstick: NEGATIVE
Nitrite: NEGATIVE
Protein, ur: 30 mg/dL — AB
Specific Gravity, Urine: 1.039 — ABNORMAL HIGH (ref 1.005–1.030)
pH: 6 (ref 5.0–8.0)

## 2023-05-26 LAB — CBC
HCT: 44.2 % (ref 39.0–52.0)
Hemoglobin: 14.3 g/dL (ref 13.0–17.0)
MCH: 29.1 pg (ref 26.0–34.0)
MCHC: 32.4 g/dL (ref 30.0–36.0)
MCV: 89.8 fL (ref 80.0–100.0)
Platelets: 216 10*3/uL (ref 150–400)
RBC: 4.92 MIL/uL (ref 4.22–5.81)
RDW: 13.8 % (ref 11.5–15.5)
WBC: 5 10*3/uL (ref 4.0–10.5)
nRBC: 0 % (ref 0.0–0.2)

## 2023-05-26 LAB — RESP PANEL BY RT-PCR (RSV, FLU A&B, COVID)  RVPGX2
Influenza A by PCR: NEGATIVE
Influenza B by PCR: NEGATIVE
Resp Syncytial Virus by PCR: NEGATIVE
SARS Coronavirus 2 by RT PCR: NEGATIVE

## 2023-05-26 LAB — COMPREHENSIVE METABOLIC PANEL
ALT: 19 U/L (ref 0–44)
AST: 20 U/L (ref 15–41)
Albumin: 4.3 g/dL (ref 3.5–5.0)
Alkaline Phosphatase: 130 U/L — ABNORMAL HIGH (ref 38–126)
Anion gap: 8 (ref 5–15)
BUN: 16 mg/dL (ref 8–23)
CO2: 25 mmol/L (ref 22–32)
Calcium: 9.4 mg/dL (ref 8.9–10.3)
Chloride: 107 mmol/L (ref 98–111)
Creatinine, Ser: 1.27 mg/dL — ABNORMAL HIGH (ref 0.61–1.24)
GFR, Estimated: >60 mL/min (ref >60)
Glucose, Bld: 87 mg/dL (ref 70–99)
Potassium: 3.6 mmol/L (ref 3.5–5.1)
Sodium: 140 mmol/L (ref 135–145)
Total Bilirubin: 0.5 mg/dL (ref 0.0–1.2)
Total Protein: 8.4 g/dL — ABNORMAL HIGH (ref 6.5–8.1)

## 2023-05-26 LAB — LIPASE, BLOOD: Lipase: 43 U/L (ref 11–51)

## 2023-05-26 MED ORDER — IOHEXOL 300 MG/ML  SOLN
100.0000 mL | Freq: Once | INTRAMUSCULAR | Status: AC | PRN
  Administered 2023-05-26: 100 mL via INTRAVENOUS

## 2023-05-26 MED ORDER — ONDANSETRON HCL 4 MG/2ML IJ SOLN
4.0000 mg | Freq: Once | INTRAMUSCULAR | Status: AC
  Administered 2023-05-26: 4 mg via INTRAVENOUS
  Filled 2023-05-26: qty 2

## 2023-05-26 MED ORDER — HYDROMORPHONE HCL 1 MG/ML IJ SOLN
0.5000 mg | Freq: Once | INTRAMUSCULAR | Status: AC
  Administered 2023-05-26: 0.5 mg via INTRAVENOUS
  Filled 2023-05-26: qty 1

## 2023-05-26 NOTE — ED Triage Notes (Signed)
 Pt reports he is here today due to abd pain since this morning. Pt reports N&V&D. Pt reports nasal congestion.

## 2023-05-26 NOTE — ED Provider Notes (Signed)
 [place] EMERGENCY DEPARTMENT AT [facility] Provider Note   CSN: [id number] Arrival date & time: 05/26/23  1840     History {Add pertinent medical, surgical, social history, OB history to HPI:1} Chief Complaint  Patient presents with   Abdominal Pain    Jamie Clark is a 73 y.o. male.  73 year old male with a history of HIV, hepatitis C, and hypertension who presents to the emergency department with abdominal pain, nausea, vomiting, and diarrhea.  Patient reports that someone at work is sick with a GI bug.  Says that last night he started experiencing abdominal pain.  Describes it as a dull pain.  7/10 in severity.  Constant.  Worsened by movement.  In his left lower quadrant.  Denies any testicular pain or swelling.  Says that this morning he had an episode of vomiting and 1 episode of diarrhea as well.  Has not been passing gas today.  Went to urgent care and told them that he also felt bloated and they told him to come to the emergency department for CT scan.  No abdominal surgery.       Home Medications Prior to Admission medications   Medication Sig Start Date End Date Taking? Authorizing Provider  aspirin EC 81 MG tablet Take 1 tablet (81 mg total) by mouth daily. Swallow whole. 01/21/22   Perlie Gold, PA-C  atorvastatin (LIPITOR) 80 MG tablet Take 1 tablet (80 mg total) by mouth daily. 04/01/22   Tereso Newcomer T, PA-C  carvedilol (COREG) 6.25 MG tablet Take 1 tablet (6.25 mg total) by mouth 2 (two) times daily with a meal. 01/20/22   Perlie Gold, PA-C  dolutegravir-lamiVUDine (DOVATO) 50-300 MG tablet Take 1 tablet by mouth daily. 01/08/23   Blanchard Kelch, NP  ezetimibe (ZETIA) 10 MG tablet Take 1 tablet (10 mg total) by mouth daily. 05/20/22   Sharlene Dory, PA-C  finasteride (PROSCAR) 5 MG tablet TAKE 1 TABLET(5 MG) BY MOUTH DAILY 08/18/22   Nafziger, Kandee Keen, NP  nitroGLYCERIN (NITROSTAT) 0.4 MG SL tablet Place 1 tablet (0.4 mg total) under the tongue  every 5 (five) minutes x 3 doses as needed for chest pain. 05/20/22   Sharlene Dory, PA-C  Olmesartan-amLODIPine-HCTZ 40-10-25 MG TABS Take 1 tablet by mouth daily. 02/16/22   [provider]  prasugrel (EFFIENT) 10 MG TABS tablet TAKE 1 TABLET(10 MG) BY MOUTH DAILY 10/22/22   Sharlene Dory, PA-C      Allergies    Patient has no known allergies.    Review of Systems   Review of Systems  Physical Exam Updated Vital Signs BP (!) 180/92 (BP Location: Right Arm)   Pulse 65   Temp (!) 97.5 F (36.4 C) (Oral)   Resp 16   Ht 5\' 7"  (1.702 m)   Wt 79.4 kg   SpO2 95%   BMI 27.41 kg/m  Physical Exam Vitals and nursing note reviewed.  Constitutional:      General: He is not in acute distress.    Appearance: He is well-developed.  HENT:     Head: Normocephalic and atraumatic.     Right Ear: External ear normal.     Left Ear: External ear normal.     Nose: Nose normal.  Eyes:     Extraocular Movements: Extraocular movements intact.     Conjunctiva/sclera: Conjunctivae normal.     Pupils: Pupils are equal, round, and reactive to light.  Abdominal:     General: There is no distension.  Palpations: Abdomen is soft. There is no mass.     Tenderness: There is abdominal tenderness (LLQ). There is no guarding.  Musculoskeletal:     Cervical back: Normal range of motion and neck supple.  Skin:    General: Skin is warm and dry.  Neurological:     Mental Status: He is alert. Mental status is at baseline.  Psychiatric:        Mood and Affect: Mood normal.        Behavior: Behavior normal.     ED Results / Procedures / Treatments   Labs (all labs ordered are listed, but only abnormal results are displayed) Labs Reviewed  COMPREHENSIVE METABOLIC PANEL - Abnormal; Notable for the following components:      Result Value   Creatinine, Ser 1.27 (*)    Total Protein 8.4 (*)    Alkaline Phosphatase 130 (*)    All other components within normal limits  URINALYSIS, ROUTINE W  REFLEX MICROSCOPIC - Abnormal; Notable for the following components:   Specific Gravity, Urine 1.039 (*)    Ketones, ur TRACE (*)    Protein, ur 30 (*)    Leukocytes,Ua SMALL (*)    Bacteria, UA RARE (*)    All other components within normal limits  RESP PANEL BY RT-PCR (RSV, FLU A&B, COVID)  RVPGX2  LIPASE, BLOOD  CBC    EKG None  Radiology No results found.  Procedures Procedures  {Document cardiac monitor, telemetry assessment procedure when appropriate:1}  Medications Ordered in ED Medications - No data to display  ED Course/ Medical Decision Making/ A&P   {   Click here for ABCD2, HEART and other calculatorsREFRESH Note before signing :1}                              Medical Decision Making Amount and/or Complexity of Data Reviewed Labs: ordered. Radiology: ordered.  Risk Prescription drug management.   ***  {Document critical care time when appropriate:1} {Document review of labs and clinical decision tools ie heart score, Chads2Vasc2 etc:1}  {Document your independent review of radiology images, and any outside records:1} {Document your discussion with family members, caretakers, and with consultants:1} {Document social determinants of health affecting pt's care:1} {Document your decision making why or why not admission, treatments were needed:1} Final Clinical Impression(s) / ED Diagnoses Final diagnoses:  None    Rx / DC Orders ED Discharge Orders     None

## 2023-05-26 NOTE — ED Provider Notes (Signed)
 Received patient in turnover from Dr. Jarold Motto.  Please see their note for further details of Hx, PE.  Briefly patient is a 73 y.o. male with a Abdominal Pain .  Patient with abdominal pain.  Plan for CT imaging..  CT scan resulted with possible colitis.  There is also some concern about pneumonia.  I discussed results with the patient.  He denies cough denies difficulty breathing.  Will start on antibiotics for possible bacterial colitis.  PCP follow-up.      Melene Plan, DO 05/27/23 0040

## 2023-05-27 MED ORDER — AMOXICILLIN-POT CLAVULANATE 875-125 MG PO TABS: 1.0000 | ORAL_TABLET | Freq: Two times a day (BID) | ORAL | 0 refills | Status: AC

## 2023-05-27 MED ORDER — ONDANSETRON 4 MG PO TBDP: ORAL_TABLET | ORAL | 0 refills | Status: AC

## 2023-05-27 NOTE — Discharge Instructions (Signed)
 Please return for worsening pain fever inability eat or drink

## 2023-05-30 NOTE — Progress Notes (Signed)
  Subjective:  Patient ID: Jamie Clark, male    DOB: [birth year],  MRN: [id number]  73 y.o. male presents painful, elongated thickened toenails x 10 which are symptomatic when wearing enclosed shoe gear. This interferes with his/her daily activities. Chief Complaint  Patient presents with   rfc    "He is here for a nail trim, PCP is [facility] and  see every 3 months. He would like to know if there is a clipper over the counter he can buy to help trim his toes"    New problem(s): None   PCP is Lourena Simmonds, Donald Pore, MD , and last visit was May 26, 2023.  No Known Allergies  Review of Systems: Negative except as noted in the HPI.   Objective:  Jamie Clark is a pleasant 73 y.o. male WD, WN in NAD. AAO x 3.  Vascular Examination: Vascular status intact b/l with palpable pedal pulses. CFT immediate b/l. Pedal hair present. No edema. No pain with calf compression b/l. Skin temperature gradient WNL b/l. No varicosities noted. No cyanosis or clubbing noted.  Neurological Examination: Sensation grossly intact b/l with 10 gram monofilament. Vibratory sensation intact b/l.  Dermatological Examination: Pedal skin with normal turgor, texture and tone b/l. No open wounds nor interdigital macerations noted. Toenails 1-5 b/l thick, discolored, elongated with subungual debris and pain on dorsal palpation. No hyperkeratotic lesions noted b/l.   Musculoskeletal Examination: Muscle strength 5/5 to b/l LE.  No pain, crepitus noted b/l. No gross pedal deformities. Patient ambulates independently without assistive aids.   Radiographs: None Assessment:   1. Pain due to onychomycosis of toenails of both feet    Plan:  Patient was evaluated and treated. All patient's and/or POA's questions/concerns addressed on today's visit. Mycotic toenails 1-5 debrided in length and girth without incident. Continue soft, supportive shoe gear daily. Report any pedal injuries to medical professional. Call  office if there are any quesitons/concerns. -Patient/POA to call should there be question/concern in the interim.  Return in about 3 months (around 08/25/2023).  Freddie Breech, DPM      [place] LOCATION: [address] [place] [address]                   Office [phone]   [facility] LOCATION: [address] [place], [place] [address] Office [phone]

## 2023-06-22 NOTE — Progress Notes (Signed)
 COVID and flu ordered to test for infection

## 2023-07-20 ENCOUNTER — Other Ambulatory Visit: Payer: Self-pay | Admitting: Family Medicine

## 2023-07-20 DIAGNOSIS — Z0000 Encounter for general adult medical examination without abnormal findings: Secondary | ICD-10-CM

## 2023-08-03 ENCOUNTER — Ambulatory Visit
Admission: RE | Admit: 2023-08-03 | Discharge: 2023-08-03 | Disposition: A | Discharge status: Home / Self Care | Source: Ambulatory Visit | Attending: Family Medicine | Admitting: Family Medicine

## 2023-08-03 DIAGNOSIS — Z0000 Encounter for general adult medical examination without abnormal findings: Secondary | ICD-10-CM

## 2023-08-20 NOTE — Progress Notes (Signed)
 The ASCVD Risk score (Arnett DK, et al., 2019) failed to calculate for the following reasons:   Risk score cannot be calculated because patient has a medical history suggesting prior/existing ASCVD  Arlon Bergamo, BSN, RN

## 2023-08-25 ENCOUNTER — Ambulatory Visit (INDEPENDENT_AMBULATORY_CARE_PROVIDER_SITE_OTHER)

## 2023-08-25 ENCOUNTER — Encounter (HOSPITAL_COMMUNITY): Payer: Self-pay

## 2023-08-25 ENCOUNTER — Ambulatory Visit (HOSPITAL_COMMUNITY): Admission: EM | Admit: 2023-08-25 | Discharge: 2023-08-25 | Disposition: A | Discharge status: Home / Self Care

## 2023-08-25 DIAGNOSIS — R197 Diarrhea, unspecified: Secondary | ICD-10-CM | POA: Diagnosis not present

## 2023-08-25 DIAGNOSIS — R0602 Shortness of breath: Secondary | ICD-10-CM

## 2023-08-25 DIAGNOSIS — J209 Acute bronchitis, unspecified: Secondary | ICD-10-CM

## 2023-08-25 MED ORDER — PROMETHAZINE-DM 6.25-15 MG/5ML PO SYRP: 2.5000 mL | ORAL_SOLUTION | Freq: Four times a day (QID) | ORAL | 0 refills | Status: AC | PRN

## 2023-08-25 MED ORDER — DEXAMETHASONE SODIUM PHOSPHATE 10 MG/ML IJ SOLN
INTRAMUSCULAR | Status: AC
  Filled 2023-08-25: qty 1

## 2023-08-25 MED ORDER — ALBUTEROL SULFATE HFA 108 (90 BASE) MCG/ACT IN AERS: 1.0000 | INHALATION_SPRAY | Freq: Four times a day (QID) | RESPIRATORY_TRACT | 0 refills | Status: AC | PRN

## 2023-08-25 MED ORDER — DEXAMETHASONE SODIUM PHOSPHATE 10 MG/ML IJ SOLN
10.0000 mg | Freq: Once | INTRAMUSCULAR | Status: AC
  Administered 2023-08-25: 10 mg via INTRAMUSCULAR

## 2023-08-25 MED ORDER — PREDNISONE 20 MG PO TABS: 40.0000 mg | ORAL_TABLET | Freq: Every day | ORAL | 0 refills | Status: AC

## 2023-08-25 NOTE — ED Triage Notes (Signed)
 Chief Complaint: dry cough, diarrhea, and stomach growling. Denies any other sick symptoms. Denies history of respiratory issues. States at night when laying on his back he will start to cough and "gasp" for air. States has had some emesis. Was seen for the same symptoms at his primary and treated for the same thing.   Sick exposure: No  Onset: 3 weeks   Prescriptions or OTC medications tried: Yes- Abx, otc cough medication, Claritin     with little relief allowing him to sleep   New foods, medications, or products: Metformin and amoxicillin    Recent Travel: No

## 2023-08-25 NOTE — ED Provider Notes (Signed)
 [facility]    CSN: [id number] Arrival date & time: 08/25/23  1811      History   Chief Complaint Chief Complaint  Patient presents with   Cough    HPI Jamie Clark is a 73 y.o. male.   Patient presents to clinic over concern of cough and shortness of breath.  Patient was seen at a local emergency department where he was diagnosed with colitis on 05/26/23.  CT abdomen pelvis showed questionable pneumonia, at that time he was not having any symptoms.  Was started on Augmentin .  On 5/29 patient was seen for a viral URI for 2 weeks and started on Augmentin .  Has been taking with the Augmentin  as prescribed, did miss this morning's dose because he woke up late.  Cough is worse at night and will wake him up from sleep.  He feels like there is something stuck in his throat and has a hard time getting a full breath at night.  Will have posttussive emesis at night.  He has also been having diarrhea recently.  Did recently start metformin 1000 mg twice daily with a meal.  Feels like his stomach is gurgling.  The history is provided by the patient and medical records.  Cough   Past Medical History:  Diagnosis Date   Allergy    Anxiety    Depression    Hepatitis C, chronic (HCC)    HIV positive (HCC)    Hypertension    NSTEMI (non-ST elevated myocardial infarction) [facility])     Patient Active Problem List   Diagnosis Date Noted   Macrocytic anemia 01/08/2023   Gastroenteritis 01/08/2023   Crush injury of left foot, initial encounter 07/02/2022   Coronary artery disease involving native coronary artery of native heart without angina pectoris 02/17/2022   Daytime sleepiness 02/17/2022   Snoring 02/17/2022   Poor appetite 02/17/2022   Hx of NSTEMI 12/2021 s/p PCI to LAD 01/18/2022   Insomnia 04/24/2020   Grief at loss of child 07/04/2019   Anxiety 12/22/2018   Prostatic hypertrophy 12/22/2018   Prediabetes 08/22/2018   Screen for STD (sexually transmitted  disease) 08/22/2018   Hyperlipidemia LDL goal <70 01/17/2009   Essential hypertension 01/17/2009   Human immunodeficiency virus (HIV) disease (HCC) 12/18/2008   Hepatitis C virus infection cured after antiviral drug therapy 07/13/2007    Past Surgical History:  Procedure Laterality Date   CORONARY STENT INTERVENTION N/A 01/19/2022   Procedure: CORONARY STENT INTERVENTION;  Surgeon: Sammy Crisp, MD;  Location: [facility];  Service: Cardiovascular;  Laterality: N/A;   LEFT HEART CATH AND CORONARY ANGIOGRAPHY N/A 01/19/2022   Procedure: LEFT HEART CATH AND CORONARY ANGIOGRAPHY;  Surgeon: Sammy Crisp, MD;  Location: [facility];  Service: Cardiovascular;  Laterality: N/A;   NO PAST SURGERIES         Home Medications    Prior to Admission medications   Medication Sig Start Date End Date Taking? Authorizing Provider  albuterol (VENTOLIN HFA) 108 (90 Base) MCG/ACT inhaler Inhale 1-2 puffs into the lungs every 6 (six) hours as needed for wheezing or shortness of breath. 08/25/23  Yes Sherril Shipman  N, FNP  amoxicillin -clavulanate (AUGMENTIN ) 875-125 MG tablet Take 1 tablet by mouth every 12 (twelve) hours. 05/27/23  Yes Albertus Hughs, DO  aspirin  EC 81 MG tablet Take 1 tablet (81 mg total) by mouth daily. Swallow whole. 01/21/22  Yes Leala Prince, PA-C  atorvastatin  (LIPITOR) 80 MG tablet Take 1 tablet (80 mg total)  by mouth daily. 04/01/22  Yes Weaver, Scott T, PA-C  carvedilol  (COREG ) 6.25 MG tablet Take 1 tablet (6.25 mg total) by mouth 2 (two) times daily with a meal. 01/20/22  Yes Leala Prince, PA-C  dolutegravir -lamiVUDine  (DOVATO ) 50-300 MG tablet Take 1 tablet by mouth daily. 01/08/23  Yes Orson Blalock, NP  ezetimibe  (ZETIA ) 10 MG tablet Take 1 tablet (10 mg total) by mouth daily. 05/20/22  Yes Conte, Tessa N, PA-C  finasteride  (PROSCAR ) 5 MG tablet TAKE 1 TABLET(5 MG) BY MOUTH DAILY 08/18/22  Yes Nafziger, Randel Buss, NP  metFORMIN (GLUCOPHAGE) 1000 MG tablet Take  1,000 mg by mouth 2 (two) times daily with a meal. 08/19/23  Yes [provider]  Olmesartan -amLODIPine -HCTZ 40-10-25 MG TABS Take 1 tablet by mouth daily. 02/16/22  Yes [provider]  ondansetron  (ZOFRAN -ODT) 4 MG disintegrating tablet 4mg  ODT q4 hours prn nausea/vomit 05/27/23  Yes Floyd, Dan, DO  prasugrel  (EFFIENT ) 10 MG TABS tablet TAKE 1 TABLET(10 MG) BY MOUTH DAILY 10/22/22  Yes Rueben Cote, Tessa N, PA-C  predniSONE  (DELTASONE ) 20 MG tablet Take 2 tablets (40 mg total) by mouth daily with breakfast for 5 days. 08/25/23 08/30/23 Yes Odaly Peri  N, FNP  promethazine -dextromethorphan (PROMETHAZINE -DM) 6.25-15 MG/5ML syrup Take 2.5 mLs by mouth 4 (four) times daily as needed for cough. 08/25/23  Yes Joanathan Affeldt  N, FNP  nitroGLYCERIN  (NITROSTAT ) 0.4 MG SL tablet Place 1 tablet (0.4 mg total) under the tongue every 5 (five) minutes x 3 doses as needed for chest pain. 05/20/22   Von Grumbling, PA-C    Family History Family History  Problem Relation Age of Onset   Cancer Mother    Hypertension Mother    Esophageal cancer Mother    Cancer Father    Hypertension Father    Colon cancer Neg Hx    Colon polyps Neg Hx    Rectal cancer Neg Hx    Stomach cancer Neg Hx     Social History Social History   Tobacco Use   Smoking status: Light Smoker    Current packs/day: 0.15    Types: Cigarettes   Smokeless tobacco: Never   Tobacco comments:    3 per day, working on quitting  Vaping Use   Vaping status: Never Used  Substance Use Topics   Alcohol use: No    Alcohol/week: 0.0 standard drinks of alcohol   Drug use: No     Allergies   Patient has no known allergies.   Review of Systems Review of Systems  Per HPI  Physical Exam Triage Vital Signs ED Triage Vitals  Encounter Vitals Group     BP 08/25/23 1846 (!) 165/92     Systolic BP Percentile --      Diastolic BP Percentile --      Pulse Rate 08/25/23 1846 71     Resp 08/25/23 1846 18     Temp 08/25/23  1846 98.8 F (37.1 C)     Temp Source 08/25/23 1846 Oral     SpO2 08/25/23 1846 93 %     Weight 08/25/23 1846 180 lb (81.6 kg)     Height 08/25/23 1846 5\' 8"  (1.727 m)     Head Circumference --      Peak Flow --      Pain Score 08/25/23 1843 6     Pain Loc --      Pain Education --      Exclude from Growth Chart --    No data found.  Updated Vital Signs BP (!) 165/92 (BP Location: Right Arm) Comment: Did not take BP meds today  Pulse 71   Temp 98.8 F (37.1 C) (Oral)   Resp 18   Ht 5\' 8"  (1.727 m)   Wt 180 lb (81.6 kg)   SpO2 93%   BMI 27.37 kg/m   Visual Acuity Right Eye Distance:   Left Eye Distance:   Bilateral Distance:    Right Eye Near:   Left Eye Near:    Bilateral Near:     Physical Exam Vitals and nursing note reviewed.  Constitutional:      Appearance: Normal appearance.  HENT:     Head: Normocephalic and atraumatic.     Right Ear: External ear normal.     Left Ear: External ear normal.     Nose: Nose normal.     Mouth/Throat:     Mouth: Mucous membranes are moist.  Eyes:     Conjunctiva/sclera: Conjunctivae normal.  Cardiovascular:     Rate and Rhythm: Normal rate and regular rhythm.     Heart sounds: Normal heart sounds. No murmur heard. Pulmonary:     Effort: Pulmonary effort is normal. No respiratory distress.     Breath sounds: Normal breath sounds.  Musculoskeletal:        General: Normal range of motion.  Skin:    General: Skin is warm and dry.  Neurological:     General: No focal deficit present.     Mental Status: He is alert.      UC Treatments / Results  Labs (all labs ordered are listed, but only abnormal results are displayed) Labs Reviewed - No data to display  EKG   Radiology DG Chest 2 View Result Date: 08/25/2023 CLINICAL DATA:  Cough and shortness of breath EXAM: CHEST - 2 VIEW COMPARISON:  Chest x-ray 01/18/2024 FINDINGS: The heart size and mediastinal contours are within normal limits. Both lungs are clear. The  visualized skeletal structures are unremarkable. IMPRESSION: No active cardiopulmonary disease. Electronically Signed   By: Tyron Gallon M.D.   On: 08/25/2023 19:26    Procedures Procedures (including critical care time)  Medications Ordered in UC Medications  dexamethasone  (DECADRON ) injection 10 mg (has no administration in time range)    Initial Impression / Assessment and Plan / UC Course  I have reviewed the triage vital signs and the nursing notes.  Pertinent labs & imaging results that were available during my care of the patient were reviewed by me and considered in my medical decision making (see chart for details).  Vitals in triage reviewed, patient is hemodynamically stable.  Lungs are vesicular, heart with regular rate and rhythm.  Chest x-ray by my interpretation does not show obvious infiltrate.  Suspect bronchitis, will treat with IM steroid, oral steroid and albuterol inhaler as needed.  Suspect diarrhea from starting metformin.  Discussed gradual reintroduction to help prevent diarrhea.  Plan of care, follow-up care return precautions given, no questions at this time.  Work note provided.   Final Clinical Impressions(s) / UC Diagnoses   Final diagnoses:  Shortness of breath  Acute bronchitis, unspecified organism  Diarrhea, unspecified type     Discharge Instructions      We have given you a steroid injection today in clinic to help with your lung inflammation.  Starting tomorrow take the prednisone  tablets with breakfast.  You can use the cough syrup as needed, this may cause drowsiness or sedation to do not drink alcohol or drive  on it.  Use it with caution.  Using inhaler as needed for any wheezing or shortness of breath.  I believe the metformin is causing your diarrhea.  Take half a tablet at night for the next week and then you can add half a tablet in the morning for another week, transitioning back to the whole tablet twice daily on your third week.   This should help with the diarrhea.  Symptoms should improve with medication, if no improvement or any changes return to clinic or follow-up with your primary care provider.    ED Prescriptions     Medication Sig Dispense Auth. Provider   promethazine -dextromethorphan (PROMETHAZINE -DM) 6.25-15 MG/5ML syrup Take 2.5 mLs by mouth 4 (four) times daily as needed for cough. 118 mL Harlow Lighter, Mardene Lessig  N, FNP   predniSONE  (DELTASONE ) 20 MG tablet Take 2 tablets (40 mg total) by mouth daily with breakfast for 5 days. 10 tablet Harlow Lighter, Zackari Ruane  N, FNP   albuterol (VENTOLIN HFA) 108 (90 Base) MCG/ACT inhaler Inhale 1-2 puffs into the lungs every 6 (six) hours as needed for wheezing or shortness of breath. 18 g Harlow Lighter, Kylen Schliep  N, FNP      PDMP not reviewed this encounter.   Ladonna Pickup, FNP 08/25/23 1952

## 2023-08-25 NOTE — Discharge Instructions (Signed)
 We have given you a steroid injection today in clinic to help with your lung inflammation.  Starting tomorrow take the prednisone  tablets with breakfast.  You can use the cough syrup as needed, this may cause drowsiness or sedation to do not drink alcohol or drive on it.  Use it with caution.  Using inhaler as needed for any wheezing or shortness of breath.  I believe the metformin is causing your diarrhea.  Take half a tablet at night for the next week and then you can add half a tablet in the morning for another week, transitioning back to the whole tablet twice daily on your third week.  This should help with the diarrhea.  Symptoms should improve with medication, if no improvement or any changes return to clinic or follow-up with your primary care provider.

## 2023-08-27 ENCOUNTER — Ambulatory Visit: Payer: Medicare Other | Admitting: Infectious Diseases

## 2023-08-27 ENCOUNTER — Ambulatory Visit: Payer: Self-pay | Admitting: Emergency Medicine

## 2023-09-16 ENCOUNTER — Other Ambulatory Visit: Payer: Self-pay

## 2023-09-16 ENCOUNTER — Encounter: Payer: Self-pay | Admitting: Infectious Diseases

## 2023-09-16 ENCOUNTER — Ambulatory Visit: Admitting: Infectious Diseases

## 2023-09-16 VITALS — BP 180/99 | HR 62 | Ht 68.0 in | Wt 178.0 lb

## 2023-09-16 DIAGNOSIS — R519 Headache, unspecified: Secondary | ICD-10-CM

## 2023-09-16 DIAGNOSIS — F32A Depression, unspecified: Secondary | ICD-10-CM

## 2023-09-16 DIAGNOSIS — B20 Human immunodeficiency virus [HIV] disease: Secondary | ICD-10-CM | POA: Diagnosis not present

## 2023-09-16 DIAGNOSIS — F419 Anxiety disorder, unspecified: Secondary | ICD-10-CM

## 2023-09-16 DIAGNOSIS — G4700 Insomnia, unspecified: Secondary | ICD-10-CM

## 2023-09-16 DIAGNOSIS — I252 Old myocardial infarction: Secondary | ICD-10-CM

## 2023-09-16 DIAGNOSIS — H538 Other visual disturbances: Secondary | ICD-10-CM

## 2023-09-16 MED ORDER — TRAZODONE HCL 50 MG PO TABS: 50.0000 mg | ORAL_TABLET | Freq: Every evening | ORAL | 5 refills | Status: DC | PRN

## 2023-09-16 MED ORDER — DOVATO 50-300 MG PO TABS: 1.0000 | ORAL_TABLET | Freq: Every day | ORAL | 11 refills | Status: AC
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
## 2023-09-16 NOTE — Patient Instructions (Addendum)
 Always nice to see you   Personal Neck Maurine -  See if you can get one for work.   Cooling rags - can get at [facility] or [employer] - good for the back of the neck at work.   If your headaches don't get better, please let me know   [facility] -  [address] [place], [place] [address] [phone]   [facility] @ [place] [address], [address] [place], [place] [address]  Call: [phone]  [facility] and [facility]   **Both of these clinics are in our same building

## 2023-09-17 LAB — T-HELPER CELLS (CD4) COUNT (NOT AT ARMC)
CD4 % Helper T Cell: 28 % — ABNORMAL LOW (ref 33–65)
CD4 T Cell Abs: 296 /uL — ABNORMAL LOW (ref 400–1790)

## 2023-09-18 LAB — LIPID PANEL
Cholesterol: 163 mg/dL (ref <200)
HDL: 44 mg/dL (ref >=40)
LDL Cholesterol (Calc): 98 mg/dL
Non-HDL Cholesterol (Calc): 119 mg/dL (ref <130)
Total CHOL/HDL Ratio: 3.7 (calc) (ref <5.0)
Triglycerides: 109 mg/dL (ref <150)

## 2023-09-18 LAB — HIV-1 RNA QUANT-NO REFLEX-BLD
HIV 1 RNA Quant: 24 {copies}/mL — ABNORMAL HIGH
HIV-1 RNA Quant, Log: 1.38 {Log_copies}/mL — ABNORMAL HIGH

## 2023-09-22 ENCOUNTER — Ambulatory Visit: Payer: Self-pay | Admitting: Infectious Diseases

## 2023-09-28 ENCOUNTER — Ambulatory Visit (INDEPENDENT_AMBULATORY_CARE_PROVIDER_SITE_OTHER): Admitting: Podiatry

## 2023-09-28 ENCOUNTER — Encounter: Payer: Self-pay | Admitting: Podiatry

## 2023-09-28 ENCOUNTER — Telehealth: Payer: Self-pay | Admitting: Podiatry

## 2023-09-28 DIAGNOSIS — M79674 Pain in right toe(s): Secondary | ICD-10-CM

## 2023-09-28 DIAGNOSIS — M79675 Pain in left toe(s): Secondary | ICD-10-CM | POA: Diagnosis not present

## 2023-09-28 DIAGNOSIS — B351 Tinea unguium: Secondary | ICD-10-CM

## 2023-09-28 NOTE — Telephone Encounter (Signed)
 Patient found out his A1C when he was checking out. It was 6.8

## 2023-09-28 NOTE — Progress Notes (Signed)
  Subjective:  Patient ID: Jamie Clark, male    DOB: 29-May-1950,  MRN: [id number]  73 y.o. male presents painful mycotic toenails of both feet that are difficult to trim. Pain interferes with daily activities and wearing enclosed shoe gear comfortably. He is inquiring about diabetic shoes. He is prediabetic. States last A1c was 6.8%.  Chief Complaint  Patient presents with   Diabetes    [place] NIDDM A1C 6.8 Toenail trim.    New problem(s): None   PCP is Sigrid Kays, [place], MD , and last visit was Aug 19, 2023.  No Known Allergies  Review of Systems: Negative except as noted in the HPI.   Objective:  Jamie Clark is a pleasant 73 y.o. male WD, WN in NAD. AAO x 3.  Vascular Examination: Vascular status intact b/l with palpable pedal pulses. CFT immediate b/l. Pedal hair present. No edema. No pain with calf compression b/l. Skin temperature gradient WNL b/l. No varicosities noted. No cyanosis or clubbing noted.  Neurological Examination: Sensation grossly intact b/l with 10 gram monofilament. Vibratory sensation intact b/l.  Dermatological Examination: Pedal skin with normal turgor, texture and tone b/l. No open wounds nor interdigital macerations noted. Toenails 1-5 b/l thick, discolored, elongated with subungual debris and pain on dorsal palpation. No hyperkeratotic lesions noted b/l.   Musculoskeletal Examination: Muscle strength 5/5 to b/l LE.  No pain, crepitus noted b/l. No gross pedal deformities. Patient ambulates independently without assistive aids.   Radiographs: None   Assessment:   1. Pain due to onychomycosis of toenails of both feet    Plan:  Patient does not qualify for diabetic shoes at this time.  Consent given for treatment. Patient examined. All patient's and/or POA's questions/concerns addressed on today's visit. Toenails 1-5 debrided in length and girth without incident. Continue soft, supportive shoe gear daily. Report any pedal injuries to  medical professional. Call office if there are any questions/concerns. -Shoe recommendations given for New Balance. -Patient/POA to call should there be question/concern in the interim.  Return in about 3 months (around 12/29/2023).  Delon LITTIE Merlin, DPM      [facility] LOCATION: [address] [place] [address]                   Office [phone]   [facility] LOCATION: [address] [place], [place] [address] Office [phone]

## 2023-09-28 NOTE — Patient Instructions (Addendum)
 Shoe List For sneakers recommend:  New balance 800 or higher  Purchase at [employer], [employer] or [employer] and [employer]

## 2023-10-18 ENCOUNTER — Ambulatory Visit (HOSPITAL_COMMUNITY): Admission: EM | Admit: 2023-10-18 | Discharge: 2023-10-18 | Disposition: A | Discharge status: Home / Self Care

## 2023-10-18 ENCOUNTER — Encounter (HOSPITAL_COMMUNITY): Payer: Self-pay | Admitting: *Deleted

## 2023-10-18 DIAGNOSIS — F17200 Nicotine dependence, unspecified, uncomplicated: Secondary | ICD-10-CM | POA: Diagnosis not present

## 2023-10-18 DIAGNOSIS — R112 Nausea with vomiting, unspecified: Secondary | ICD-10-CM | POA: Diagnosis not present

## 2023-10-18 DIAGNOSIS — R197 Diarrhea, unspecified: Secondary | ICD-10-CM | POA: Diagnosis not present

## 2023-10-18 DIAGNOSIS — I10 Essential (primary) hypertension: Secondary | ICD-10-CM | POA: Diagnosis not present

## 2023-10-18 LAB — POCT FASTING CBG KUC MANUAL ENTRY: POCT Glucose (KUC): 159 mg/dL — AB (ref 70–99)

## 2023-10-18 NOTE — ED Provider Notes (Signed)
 [facility]    CSN: [id number] Arrival date & time: 10/18/23  1125      History   Chief Complaint Chief Complaint  Patient presents with   Emesis   Diarrhea   Headache    HPI Jamie Clark is a 73 y.o. male.   73 year old male pt, Jamie Clark, presents to urgent care for evaluation of nausea vomiting diarrhea headache since Saturday.  Patient states he taken Tylenol  and Pepto-Bismol as needed for symptom management.  Patient is currently drinking a Pepsi in office, last vomited this morning after eating eggs.  Patient states he has had several episodes of diarrhea.  Patient recently attended a cookout over the weekend unknown illness exposure.  Patient states he gets a headache when he gets hot however this is not the worst headache of his life currently rates his headache as a 7 or 8 out of 10.  Pt states his headcahe improves with popping my neck and massage  Reports he did not take his blood pressure medicine today, patient also smokes cigarettes, deneis alcohol or drug use.  PMH: HIV, prediabetes, Hep C,HTN  The history is provided by the patient. No language interpreter was used.    Past Medical History:  Diagnosis Date   Allergy    Anxiety    Depression    Hepatitis C, chronic (HCC)    HIV positive (HCC)    Hypertension    NSTEMI (non-ST elevated myocardial infarction) [facility])     Patient Active Problem List   Diagnosis Date Noted   Nausea vomiting and diarrhea 10/18/2023   Smokes 10/18/2023   Macrocytic anemia 01/08/2023   Gastroenteritis 01/08/2023   Crush injury of left foot, initial encounter 07/02/2022   Coronary artery disease involving native coronary artery of native heart without angina pectoris 02/17/2022   Daytime sleepiness 02/17/2022   Snoring 02/17/2022   Poor appetite 02/17/2022   Hx of NSTEMI 12/2021 s/p PCI to LAD 01/18/2022   Insomnia 04/24/2020   Grief at loss of child 07/04/2019   Anxiety 12/22/2018   Prostatic  hypertrophy 12/22/2018   Prediabetes 08/22/2018   Screen for STD (sexually transmitted disease) 08/22/2018   Hyperlipidemia LDL goal <70 01/17/2009   Essential hypertension 01/17/2009   Human immunodeficiency virus (HIV) disease (HCC) 12/18/2008   Hepatitis C virus infection cured after antiviral drug therapy 07/13/2007    Past Surgical History:  Procedure Laterality Date   CORONARY STENT INTERVENTION N/A 01/19/2022   Procedure: CORONARY STENT INTERVENTION;  Surgeon: Mady Bruckner, MD;  [facility];  Service: Cardiovascular;  Laterality: N/A;   LEFT HEART CATH AND CORONARY ANGIOGRAPHY N/A 01/19/2022   Procedure: LEFT HEART CATH AND CORONARY ANGIOGRAPHY;  Surgeon: Mady Bruckner, MD;  Location: [facility];  Service: Cardiovascular;  Laterality: N/A;   NO PAST SURGERIES         Home Medications    Prior to Admission medications   Medication Sig Start Date End Date Taking? Authorizing Provider  aspirin  EC 81 MG tablet Take 1 tablet (81 mg total) by mouth daily. Swallow whole. 01/21/22  Yes Trudy Birmingham, PA-C  atorvastatin  (LIPITOR) 80 MG tablet Take 1 tablet (80 mg total) by mouth daily. 04/01/22  Yes Weaver, Scott T, PA-C  carvedilol  (COREG ) 6.25 MG tablet Take 1 tablet (6.25 mg total) by mouth 2 (two) times daily with a meal. 01/20/22  Yes Trudy Birmingham, PA-C  dolutegravir -lamiVUDine  (DOVATO ) 50-300 MG tablet Take 1 tablet by mouth daily. 09/16/23  Yes Melvenia Corean SAILOR, NP  ezetimibe  (ZETIA ) 10 MG tablet Take 1 tablet (10 mg total) by mouth daily. 05/20/22  Yes Conte, Tessa N, PA-C  finasteride  (PROSCAR ) 5 MG tablet TAKE 1 TABLET(5 MG) BY MOUTH DAILY 08/18/22  Yes Nafziger, Darleene, NP  metFORMIN (GLUCOPHAGE) 1000 MG tablet Take 1,000 mg by mouth 2 (two) times daily with a meal. 08/19/23  Yes [provider]  Olmesartan -amLODIPine -HCTZ 40-10-25 MG TABS Take 1 tablet by mouth daily. 02/16/22  Yes [provider]  prasugrel  (EFFIENT ) 10 MG  TABS tablet TAKE 1 TABLET(10 MG) BY MOUTH DAILY 10/22/22  Yes Lucien, Tessa N, PA-C  traZODone  (DESYREL ) 50 MG tablet Take 1 tablet (50 mg total) by mouth at bedtime as needed for sleep. 09/16/23  Yes Melvenia Corean SAILOR, NP  albuterol  (VENTOLIN  HFA) 108 (90 Base) MCG/ACT inhaler Inhale 1-2 puffs into the lungs every 6 (six) hours as needed for wheezing or shortness of breath. 08/25/23   Dreama, Georgia  N, FNP  amoxicillin -clavulanate (AUGMENTIN ) 875-125 MG tablet Take 1 tablet by mouth every 12 (twelve) hours. 05/27/23   Floyd, Dan, DO  DULoxetine (CYMBALTA) 30 MG capsule Take 30 mg by mouth daily.    [provider]  nitroGLYCERIN  (NITROSTAT ) 0.4 MG SL tablet Place 1 tablet (0.4 mg total) under the tongue every 5 (five) minutes x 3 doses as needed for chest pain. 05/20/22   Conte, Tessa N, PA-C  ondansetron  (ZOFRAN -ODT) 4 MG disintegrating tablet 4mg  ODT q4 hours prn nausea/vomit 05/27/23   Floyd, Dan, DO  promethazine -dextromethorphan (PROMETHAZINE -DM) 6.25-15 MG/5ML syrup Take 2.5 mLs by mouth 4 (four) times daily as needed for cough. 08/25/23   Dreama, Georgia  N, FNP    Family History Family History  Problem Relation Age of Onset   Cancer Mother    Hypertension Mother    Esophageal cancer Mother    Cancer Father    Hypertension Father    Colon cancer Neg Hx    Colon polyps Neg Hx    Rectal cancer Neg Hx    Stomach cancer Neg Hx     Social History Social History   Tobacco Use   Smoking status: Light Smoker    Current packs/day: 0.15    Types: Cigarettes   Smokeless tobacco: Never   Tobacco comments:    3 per day, working on quitting  Vaping Use   Vaping status: Never Used  Substance Use Topics   Alcohol use: No    Alcohol/week: 0.0 standard drinks of alcohol   Drug use: No     Allergies   Patient has no known allergies.   Review of Systems Review of Systems  Constitutional:  Negative for fever.  Respiratory:  Negative for chest tightness.   Cardiovascular:   Negative for chest pain, palpitations and leg swelling.  Gastrointestinal:  Positive for diarrhea and vomiting.  Neurological:  Positive for headaches.  All other systems reviewed and are negative.    Physical Exam Triage Vital Signs ED Triage Vitals  Encounter Vitals Group     BP 10/18/23 1240 (!) 167/104     Girls Systolic BP Percentile --      Girls Diastolic BP Percentile --      Boys Systolic BP Percentile --      Boys Diastolic BP Percentile --      Pulse Rate 10/18/23 1240 81     Resp 10/18/23 1240 18     Temp 10/18/23 1240 97.9 F (36.6 C)     Temp Source  10/18/23 1240 Oral     SpO2 10/18/23 1240 94 %     Weight --      Height --      Head Circumference --      Peak Flow --      Pain Score 10/18/23 1238 8     Pain Loc --      Pain Education --      Exclude from Growth Chart --    No data found.  Updated Vital Signs BP (!) 167/104 (BP Location: Right Arm)   Pulse 81   Temp 97.9 F (36.6 C) (Oral)   Resp 18   SpO2 94%   Visual Acuity Right Eye Distance:   Left Eye Distance:   Bilateral Distance:    Right Eye Near:   Left Eye Near:    Bilateral Near:     Physical Exam Vitals and nursing note reviewed.  Constitutional:      General: He is not in acute distress.    Appearance: He is well-developed and well-groomed.  HENT:     Head: Normocephalic and atraumatic.  Eyes:     Conjunctiva/sclera: Conjunctivae normal.  Cardiovascular:     Rate and Rhythm: Normal rate and regular rhythm.     Heart sounds: Normal heart sounds. No murmur heard. Pulmonary:     Effort: Pulmonary effort is normal. No respiratory distress.     Breath sounds: Normal breath sounds and air entry.  Abdominal:     General: Bowel sounds are normal.     Palpations: Abdomen is soft.     Tenderness: There is no abdominal tenderness.  Musculoskeletal:        General: No swelling.     Cervical back: Neck supple.  Skin:    General: Skin is warm and dry.     Capillary Refill:  Capillary refill takes less than 2 seconds.  Neurological:     General: No focal deficit present.     Mental Status: He is alert and oriented to person, place, and time.     GCS: GCS eye subscore is 4. GCS verbal subscore is 5. GCS motor subscore is 6.  Psychiatric:        Attention and Perception: Attention normal.        Mood and Affect: Mood normal.        Speech: Speech normal.        Behavior: Behavior is cooperative.      UC Treatments / Results  Labs (all labs ordered are listed, but only abnormal results are displayed) Labs Reviewed  POCT FASTING CBG [facility] MANUAL ENTRY - Abnormal; Notable for the following components:      Result Value   POCT Glucose ([facility]) 159 (*)    All other components within normal limits    EKG   Radiology No results found.  Procedures Procedures (including critical care time)  Medications Ordered in UC Medications - No data to display  Initial Impression / Assessment and Plan / UC Course  I have reviewed the triage vital signs and the nursing notes.  Pertinent labs & imaging results that were available during my care of the patient were reviewed by me and considered in my medical decision making (see chart for details).    Discussed exam findings and plan of care with patient, pt aware of need to not skip meds, denies worst HA of life, strict go to ER precautions given.   Pt requesting work note. Patient verbalized understanding to this  provider.  Ddx: Nausea,vomiting,diarrhea, viral illness, HTN, noncompliance with meds Final Clinical Impressions(s) / UC Diagnoses   Final diagnoses:  Nausea vomiting and diarrhea  Smokes     Discharge Instructions      Do not skip your daily meds Avoid spicy,greasy,fried foods Avoid smoking Avoid alcohol If you develop chest pain,shortness of breath, palpations, worst headache of life new or worsening issues call 9-1-1, go immediately to Er for further evaluation Push clear liquids,bland  diet    ED Prescriptions   None    PDMP not reviewed this encounter.   Aminta Loose, NP 10/18/23 1700

## 2023-10-18 NOTE — ED Triage Notes (Signed)
 Pt states he has been having diarrhea, vomiting and headache since Saturday. He has been taking tylneol, pepto as needed

## 2023-10-18 NOTE — Discharge Instructions (Addendum)
 Do not skip your daily meds, take as prescribed Avoid spicy,greasy,fried foods Avoid smoking Avoid alcohol If you develop chest pain,shortness of breath, palpations, worst headache of life, or new or worsening issues call 9-1-1, go immediately to Er for further evaluation Push clear liquids,bland diet Work note given

## 2023-10-21 ENCOUNTER — Other Ambulatory Visit: Payer: Self-pay | Admitting: Physician Assistant

## 2024-01-11 ENCOUNTER — Ambulatory Visit: Admitting: Podiatry

## 2024-01-12 ENCOUNTER — Ambulatory Visit (INDEPENDENT_AMBULATORY_CARE_PROVIDER_SITE_OTHER): Admitting: Podiatry

## 2024-01-12 DIAGNOSIS — Z91199 Patient's noncompliance with other medical treatment and regimen due to unspecified reason: Secondary | ICD-10-CM

## 2024-01-12 NOTE — Progress Notes (Signed)
 1. No-show for appointment

## 2024-03-27 ENCOUNTER — Other Ambulatory Visit: Payer: Self-pay

## 2024-03-27 ENCOUNTER — Encounter: Payer: Self-pay | Admitting: Infectious Diseases

## 2024-03-27 ENCOUNTER — Ambulatory Visit: Admitting: Infectious Diseases

## 2024-03-27 VITALS — BP 184/111 | HR 74 | Temp 97.5°F | Resp 16 | Wt 183.5 lb

## 2024-03-27 DIAGNOSIS — I10 Essential (primary) hypertension: Secondary | ICD-10-CM | POA: Diagnosis not present

## 2024-03-27 DIAGNOSIS — B20 Human immunodeficiency virus [HIV] disease: Secondary | ICD-10-CM | POA: Diagnosis not present

## 2024-03-27 DIAGNOSIS — F17210 Nicotine dependence, cigarettes, uncomplicated: Secondary | ICD-10-CM | POA: Diagnosis not present

## 2024-03-27 NOTE — Patient Instructions (Addendum)
 Ask [facility] if they can do video visits to help with your transportation.   Ask the pharmacy when they fill your Dovato  if they can put it in a regular pill bottle like the other medications.   Will plan to have you back in 6 weeks

## 2024-03-27 NOTE — Progress Notes (Signed)
 "      Patient ID: Jamie Clark, male   DOB: 10/02/1950, 74 y.o.   MRN: [id number]  Subjective   CC:  Chief Complaint  Patient presents with   Follow-up    B20      Previous Regimens:  Triumeq  Dovato  05/2022   Discussed the use of AI scribe software for clinical note transcription with the patient, who gave verbal consent to proceed.  History of Present Illness   Jamie Clark is a 74 year old male with HIV who presents for routine follow-up care.  His last visit was in June 2026, during which his viral load was undetectable, and his CD4 count was 296, which was acutely low due to an illness. He is due for a recheck of his viral load and CD4 count today. He is currently on Dovato  for HIV management. He is concerned about having his medication at home due to his granddaughter's visits.  He has been experiencing new headaches over the summer, which he attributes to stress and working in a hot environment at a programme researcher, broadcasting/film/video. He has not been able to attend therapy sessions due to car trouble.  He mentions having high blood pressure today, measured at 212/unknown, and he forgot to take his blood pressure medication this morning. No lightheadedness despite the elevated blood pressure.  He has a history of elevated PSA and had a follow-up with urology over the summer to assess bladder function.  He has not received a flu shot this season, citing previous side effects that caused him to miss work for two days.       Review of Systems  Constitutional:  Negative for chills and fever.  HENT:  Negative for sore throat.   Respiratory:  Negative for cough and shortness of breath.   Cardiovascular: Negative.   Gastrointestinal:  Negative for abdominal pain, diarrhea and vomiting.  Musculoskeletal:  Negative for myalgias and neck pain.  Skin:  Negative for rash.  Neurological:  Negative for headaches.  Psychiatric/Behavioral:  The patient is not nervous/anxious.        Outpatient Encounter Medications as of 03/27/2024  Medication Sig   albuterol  (VENTOLIN  HFA) 108 (90 Base) MCG/ACT inhaler Inhale 1-2 puffs into the lungs every 6 (six) hours as needed for wheezing or shortness of breath.   aspirin  EC 81 MG tablet Take 1 tablet (81 mg total) by mouth daily. Swallow whole.   carvedilol  (COREG ) 6.25 MG tablet Take 1 tablet (6.25 mg total) by mouth 2 (two) times daily with a meal.   dolutegravir -lamiVUDine  (DOVATO ) 50-300 MG tablet Take 1 tablet by mouth daily.   DULoxetine (CYMBALTA) 30 MG capsule Take 30 mg by mouth daily.   escitalopram  (LEXAPRO ) 10 MG tablet 1 tablet Orally Once a day for 90 days   ezetimibe  (ZETIA ) 10 MG tablet Take 1 tablet (10 mg total) by mouth daily.   finasteride  (PROSCAR ) 5 MG tablet TAKE 1 TABLET(5 MG) BY MOUTH DAILY   fluticasone  (FLONASE ) 50 MCG/ACT nasal spray Place 1 spray into both nostrils 2 (two) times daily.   metFORMIN (GLUCOPHAGE) 1000 MG tablet Take 1,000 mg by mouth 2 (two) times daily with a meal.   nitroGLYCERIN  (NITROSTAT ) 0.4 MG SL tablet Place 1 tablet (0.4 mg total) under the tongue every 5 (five) minutes x 3 doses as needed for chest pain.   Olmesartan -amLODIPine -HCTZ 40-10-25 MG TABS Take 1 tablet by mouth daily.   ondansetron  (ZOFRAN -ODT) 4 MG disintegrating tablet 4mg  ODT q4 hours prn nausea/vomit  prasugrel  (EFFIENT ) 10 MG TABS tablet TAKE 1 TABLET(10 MG) BY MOUTH DAILY   pravastatin (PRAVACHOL) 80 MG tablet SMARTSIG:1 Tablet(s) By Mouth Every Evening   promethazine -dextromethorphan (PROMETHAZINE -DM) 6.25-15 MG/5ML syrup Take 2.5 mLs by mouth 4 (four) times daily as needed for cough.   tadalafil (CIALIS) 5 MG tablet 1 tablet Orally Once a day for 90 days   tiZANidine (ZANAFLEX) 2 MG tablet SMARTSIG:1 Tablet(s) By Mouth Every 12 Hours PRN   amoxicillin -clavulanate (AUGMENTIN ) 875-125 MG tablet Take 1 tablet by mouth every 12 (twelve) hours. (Patient not taking: Reported on 03/27/2024)   [DISCONTINUED]  atorvastatin  (LIPITOR) 80 MG tablet Take 1 tablet (80 mg total) by mouth daily.   [DISCONTINUED] traZODone  (DESYREL ) 50 MG tablet Take 1 tablet (50 mg total) by mouth at bedtime as needed for sleep.   No facility-administered encounter medications on file as of 03/27/2024.     Patient Active Problem List   Diagnosis Date Noted   Nausea vomiting and diarrhea 10/18/2023   Smokes 10/18/2023   Macrocytic anemia 01/08/2023   Gastroenteritis 01/08/2023   Crush injury of left foot, initial encounter 07/02/2022   Coronary artery disease involving native coronary artery of native heart without angina pectoris 02/17/2022   Daytime sleepiness 02/17/2022   Snoring 02/17/2022   Poor appetite 02/17/2022   Hx of NSTEMI 12/2021 s/p PCI to LAD 01/18/2022   Insomnia 04/24/2020   Grief at loss of child 07/04/2019   Anxiety 12/22/2018   Prostatic hypertrophy 12/22/2018   Prediabetes 08/22/2018   Screen for STD (sexually transmitted disease) 08/22/2018   Hyperlipidemia LDL goal <70 01/17/2009   Elevated blood pressure reading in office with diagnosis of hypertension 01/17/2009   Human immunodeficiency virus (HIV) disease (HCC) 12/18/2008   Hepatitis C virus infection cured after antiviral drug therapy 07/13/2007     Health Maintenance Due  Topic Date Due   Zoster Vaccines- Shingrix (1 of 2) Never done   Influenza Vaccine  10/22/2023   COVID-19 Vaccine (3 - 2025-26 season) 11/22/2023      Objective   BP (!) 184/111   Pulse 74   Temp (!) 97.5 F (36.4 C) (Oral)   Resp 16   Wt 183 lb 8 oz (83.2 kg)   SpO2 98%   BMI 27.90 kg/m     Physical Exam Vitals reviewed.  Constitutional:      Appearance: Normal appearance. He is not ill-appearing.  HENT:     Head: Normocephalic.     Mouth/Throat:     Mouth: Mucous membranes are moist.     Pharynx: Oropharynx is clear.  Eyes:     General: No scleral icterus. Cardiovascular:     Rate and Rhythm: Normal rate and regular rhythm.  Pulmonary:      Effort: Pulmonary effort is normal.  Musculoskeletal:        General: Normal range of motion.     Cervical back: Normal range of motion.  Skin:    Coloration: Skin is not jaundiced or pale.  Neurological:     Mental Status: He is alert and oriented to person, place, and time.  Psychiatric:        Mood and Affect: Mood normal.        Judgment: Judgment normal.    HIV 1 RNA Quant  Date Value  09/16/2023 24 copies/mL (H)  02/26/2023 Not Detected Copies/mL  07/07/2022 <20 Copies/mL (H)   HIV-1 RNA Viral Load (no units)  Date Value  07/18/2013 <50  05/23/2013 <50  04/25/2013 <  50   CD4 (no units)  Date Value  07/18/2013 927  05/23/2013 991  04/25/2013 837   CD4 T Cell Abs (/uL)  Date Value  09/16/2023 296 (L)  07/07/2022 906  12/08/2021 763      Assessment and Plan    Human immunodeficiency virus (HIV) disease - HIV is well-controlled with an undetectable viral load and a CD4 count of 296, previously low due to an illness. He is interested in switching from oral Dovato  to injectable Cabenuva for convenience and privacy reasons. Discussed potential side effects of Cabenuva, including soreness and muscle knots at the injection site. Emphasized the importance of adherence to the injection schedule to maintain viral suppression. Discussed the option of PrEP for his partner to prevent HIV transmission, although it is not necessary if he continues Dovato . - Rechecked CD4 count and viral load today. - Ordered GenosureArchive to assess suitability for [employer]. - Discuss with pharmacy about switching Dovato  to a regular pill bottle. - Will schedule follow-up in 4-6 weeks to discuss blood test results and decision on Cabenuva. - Encouraged discussion with partner about PrEP options.  Hypertension - Blood pressure was elevated at 212 systolic, likely due to missed medication and stress. Discussed the use of clonidine  for acute management, noting potential side effects such as  headaches. Recheck showed systolic 182 mmHg. No symptoms.  - Will take regularly scheduled antihypertensives at home  - Rechecked blood pressure before leaving the office. - Encouraged adherence to regular antihypertensive medication regimen.  Face to face visit duration: 29 minutes  I personally spent a total of 34 minutes in the care of the patient today including preparing to see the patient, performing a medically appropriate exam/evaluation, counseling and educating, placing orders, documenting clinical information in the EHR, and coordinating care.      No orders of the defined types were placed in this encounter.  Orders Placed This Encounter  Procedures   HIV 1 RNA quant-no reflex-bld   T-helper cells (CD4) count   CBC w/Diff   Basic Metabolic Panel (BMET)   Return in about 6 weeks (around 05/08/2024).    Corean Fireman, MSN, NP-C [facility]  [email or web address] Pager: [phone] Office: [phone] [facility] Main Line: [phone] *Secure Chat Communication Welcome  "

## 2024-03-28 ENCOUNTER — Ambulatory Visit: Payer: Self-pay | Admitting: Infectious Diseases

## 2024-03-28 LAB — T-HELPER CELLS (CD4) COUNT (NOT AT ARMC)
CD4 % Helper T Cell: 40 % (ref 33–65)
CD4 T Cell Abs: 580 /uL (ref 400–1790)

## 2024-03-29 LAB — CBC WITH DIFFERENTIAL/PLATELET
Absolute Lymphocytes: 1808 {cells}/uL (ref 850–3900)
Absolute Monocytes: 279 {cells}/uL (ref 200–950)
Basophils Absolute: 29 {cells}/uL (ref 0–200)
Basophils Relative: 0.7 %
Eosinophils Absolute: 168 {cells}/uL (ref 15–500)
Eosinophils Relative: 4.1 %
HCT: 46.4 % (ref 39.4–51.1)
Hemoglobin: 15.7 g/dL (ref 13.2–17.1)
MCH: 31.9 pg (ref 27.0–33.0)
MCHC: 33.8 g/dL (ref 31.6–35.4)
MCV: 94.3 fL (ref 81.4–101.7)
MPV: 10.2 fL (ref 7.5–12.5)
Monocytes Relative: 6.8 %
Neutro Abs: 1816 {cells}/uL (ref 1500–7800)
Neutrophils Relative %: 44.3 %
Platelets: 252 Thousand/uL (ref 140–400)
RBC: 4.92 Million/uL (ref 4.20–5.80)
RDW: 14.2 % (ref 11.0–15.0)
Total Lymphocyte: 44.1 %
WBC: 4.1 Thousand/uL (ref 3.8–10.8)

## 2024-03-29 LAB — BASIC METABOLIC PANEL WITH GFR
BUN: 14 mg/dL (ref 7–25)
CO2: 23 mmol/L (ref 20–32)
Calcium: 9.4 mg/dL (ref 8.6–10.3)
Chloride: 106 mmol/L (ref 98–110)
Creat: 1.26 mg/dL (ref 0.70–1.28)
Glucose, Bld: 92 mg/dL (ref 65–99)
Sodium: 140 mmol/L (ref 135–146)
eGFR: 61 mL/min/1.73m2

## 2024-03-29 LAB — HIV-1 RNA QUANT-NO REFLEX-BLD
HIV 1 RNA Quant: <20 {copies}/mL — AB
HIV-1 RNA Quant, Log: <1.3 {Log_copies}/mL — AB

## 2024-05-03 ENCOUNTER — Ambulatory Visit: Payer: Self-pay | Admitting: Infectious Diseases
